# Patient Record
Sex: Male | Born: 1950
Health system: Southern US, Community
[De-identification: ages and names within clinical notes are randomized; demographics above are authoritative.]

## PROBLEM LIST (undated history)

## (undated) DIAGNOSIS — I2510 Atherosclerotic heart disease of native coronary artery without angina pectoris: Secondary | ICD-10-CM

## (undated) DIAGNOSIS — Z720 Tobacco use: Secondary | ICD-10-CM

## (undated) DIAGNOSIS — H269 Unspecified cataract: Secondary | ICD-10-CM

## (undated) HISTORY — PX: EYE SURGERY: SHX253

## (undated) HISTORY — DX: Unspecified cataract: H26.9

---

## 2018-11-23 ENCOUNTER — Emergency Department (HOSPITAL_COMMUNITY): Payer: Medicare Other

## 2018-11-23 ENCOUNTER — Encounter (HOSPITAL_COMMUNITY): Payer: Self-pay | Admitting: *Deleted

## 2018-11-23 ENCOUNTER — Other Ambulatory Visit: Payer: Self-pay

## 2018-11-23 ENCOUNTER — Inpatient Hospital Stay (HOSPITAL_COMMUNITY)
Admission: EM | Admit: 2018-11-23 | Discharge: 2018-12-11 | DRG: 215 | Disposition: A | Discharge status: Rehabilitation Facility | Payer: Medicare Other | Attending: Cardiothoracic Surgery | Admitting: Cardiothoracic Surgery

## 2018-11-23 DIAGNOSIS — J9690 Respiratory failure, unspecified, unspecified whether with hypoxia or hypercapnia: Secondary | ICD-10-CM

## 2018-11-23 DIAGNOSIS — Z09 Encounter for follow-up examination after completed treatment for conditions other than malignant neoplasm: Secondary | ICD-10-CM

## 2018-11-23 DIAGNOSIS — I959 Hypotension, unspecified: Secondary | ICD-10-CM | POA: Diagnosis not present

## 2018-11-23 DIAGNOSIS — T8111XA Postprocedural  cardiogenic shock, initial encounter: Secondary | ICD-10-CM | POA: Diagnosis not present

## 2018-11-23 DIAGNOSIS — E872 Acidosis: Secondary | ICD-10-CM

## 2018-11-23 DIAGNOSIS — I252 Old myocardial infarction: Secondary | ICD-10-CM

## 2018-11-23 DIAGNOSIS — Z8249 Family history of ischemic heart disease and other diseases of the circulatory system: Secondary | ICD-10-CM

## 2018-11-23 DIAGNOSIS — Z9889 Other specified postprocedural states: Secondary | ICD-10-CM

## 2018-11-23 DIAGNOSIS — I5041 Acute combined systolic (congestive) and diastolic (congestive) heart failure: Secondary | ICD-10-CM | POA: Diagnosis present

## 2018-11-23 DIAGNOSIS — R0603 Acute respiratory distress: Secondary | ICD-10-CM | POA: Diagnosis not present

## 2018-11-23 DIAGNOSIS — J9601 Acute respiratory failure with hypoxia: Secondary | ICD-10-CM | POA: Diagnosis present

## 2018-11-23 DIAGNOSIS — D689 Coagulation defect, unspecified: Secondary | ICD-10-CM | POA: Diagnosis not present

## 2018-11-23 DIAGNOSIS — J811 Chronic pulmonary edema: Secondary | ICD-10-CM

## 2018-11-23 DIAGNOSIS — R7303 Prediabetes: Secondary | ICD-10-CM

## 2018-11-23 DIAGNOSIS — R778 Other specified abnormalities of plasma proteins: Secondary | ICD-10-CM

## 2018-11-23 DIAGNOSIS — J90 Pleural effusion, not elsewhere classified: Secondary | ICD-10-CM

## 2018-11-23 DIAGNOSIS — N200 Calculus of kidney: Secondary | ICD-10-CM | POA: Diagnosis present

## 2018-11-23 DIAGNOSIS — R0902 Hypoxemia: Secondary | ICD-10-CM

## 2018-11-23 DIAGNOSIS — I161 Hypertensive emergency: Secondary | ICD-10-CM | POA: Diagnosis present

## 2018-11-23 DIAGNOSIS — D696 Thrombocytopenia, unspecified: Secondary | ICD-10-CM | POA: Diagnosis not present

## 2018-11-23 DIAGNOSIS — E874 Mixed disorder of acid-base balance: Secondary | ICD-10-CM | POA: Diagnosis present

## 2018-11-23 DIAGNOSIS — Z951 Presence of aortocoronary bypass graft: Secondary | ICD-10-CM

## 2018-11-23 DIAGNOSIS — I340 Nonrheumatic mitral (valve) insufficiency: Secondary | ICD-10-CM | POA: Diagnosis present

## 2018-11-23 DIAGNOSIS — J942 Hemothorax: Secondary | ICD-10-CM | POA: Diagnosis not present

## 2018-11-23 DIAGNOSIS — F17210 Nicotine dependence, cigarettes, uncomplicated: Secondary | ICD-10-CM | POA: Diagnosis present

## 2018-11-23 DIAGNOSIS — I480 Paroxysmal atrial fibrillation: Secondary | ICD-10-CM | POA: Diagnosis not present

## 2018-11-23 DIAGNOSIS — Z9689 Presence of other specified functional implants: Secondary | ICD-10-CM

## 2018-11-23 DIAGNOSIS — J9602 Acute respiratory failure with hypercapnia: Secondary | ICD-10-CM | POA: Diagnosis present

## 2018-11-23 DIAGNOSIS — E7800 Pure hypercholesterolemia, unspecified: Secondary | ICD-10-CM | POA: Diagnosis present

## 2018-11-23 DIAGNOSIS — Y92239 Unspecified place in hospital as the place of occurrence of the external cause: Secondary | ICD-10-CM | POA: Diagnosis present

## 2018-11-23 DIAGNOSIS — I5042 Chronic combined systolic (congestive) and diastolic (congestive) heart failure: Secondary | ICD-10-CM

## 2018-11-23 DIAGNOSIS — I110 Hypertensive heart disease with heart failure: Secondary | ICD-10-CM | POA: Diagnosis present

## 2018-11-23 DIAGNOSIS — Y838 Other surgical procedures as the cause of abnormal reaction of the patient, or of later complication, without mention of misadventure at the time of the procedure: Secondary | ICD-10-CM | POA: Diagnosis present

## 2018-11-23 DIAGNOSIS — Z20828 Contact with and (suspected) exposure to other viral communicable diseases: Secondary | ICD-10-CM | POA: Diagnosis present

## 2018-11-23 DIAGNOSIS — I2510 Atherosclerotic heart disease of native coronary artery without angina pectoris: Secondary | ICD-10-CM | POA: Diagnosis present

## 2018-11-23 DIAGNOSIS — I97611 Postprocedural hemorrhage and hematoma of a circulatory system organ or structure following cardiac bypass: Secondary | ICD-10-CM | POA: Diagnosis not present

## 2018-11-23 DIAGNOSIS — D72829 Elevated white blood cell count, unspecified: Secondary | ICD-10-CM

## 2018-11-23 DIAGNOSIS — J810 Acute pulmonary edema: Secondary | ICD-10-CM

## 2018-11-23 DIAGNOSIS — Z952 Presence of prosthetic heart valve: Secondary | ICD-10-CM

## 2018-11-23 DIAGNOSIS — R918 Other nonspecific abnormal finding of lung field: Secondary | ICD-10-CM

## 2018-11-23 DIAGNOSIS — I440 Atrioventricular block, first degree: Secondary | ICD-10-CM | POA: Diagnosis not present

## 2018-11-23 DIAGNOSIS — I214 Non-ST elevation (NSTEMI) myocardial infarction: Principal | ICD-10-CM | POA: Diagnosis present

## 2018-11-23 DIAGNOSIS — Z880 Allergy status to penicillin: Secondary | ICD-10-CM

## 2018-11-23 DIAGNOSIS — I10 Essential (primary) hypertension: Secondary | ICD-10-CM

## 2018-11-23 DIAGNOSIS — E1165 Type 2 diabetes mellitus with hyperglycemia: Secondary | ICD-10-CM | POA: Diagnosis present

## 2018-11-23 DIAGNOSIS — D62 Acute posthemorrhagic anemia: Secondary | ICD-10-CM | POA: Diagnosis not present

## 2018-11-23 DIAGNOSIS — I255 Ischemic cardiomyopathy: Secondary | ICD-10-CM | POA: Diagnosis present

## 2018-11-23 DIAGNOSIS — E8729 Other acidosis: Secondary | ICD-10-CM

## 2018-11-23 DIAGNOSIS — E785 Hyperlipidemia, unspecified: Secondary | ICD-10-CM | POA: Diagnosis present

## 2018-11-23 DIAGNOSIS — I447 Left bundle-branch block, unspecified: Secondary | ICD-10-CM | POA: Diagnosis present

## 2018-11-23 HISTORY — DX: Tobacco use: Z72.0

## 2018-11-23 HISTORY — DX: Atherosclerotic heart disease of native coronary artery without angina pectoris: I25.10

## 2018-11-23 LAB — POCT I-STAT 7, (LYTES, BLD GAS, ICA,H+H)
Acid-base deficit: 4 mmol/L — ABNORMAL HIGH (ref 0.0–2.0)
Bicarbonate: 25.6 mmol/L (ref 20.0–28.0)
Calcium, Ion: 1.17 mmol/L (ref 1.15–1.40)
HCT: 48 % (ref 39.0–52.0)
Hemoglobin: 16.3 g/dL (ref 13.0–17.0)
O2 Saturation: 100 %
Patient temperature: 98
Potassium: 3.3 mmol/L — ABNORMAL LOW (ref 3.5–5.1)
Sodium: 137 mmol/L (ref 135–145)
TCO2: 28 mmol/L (ref 22–32)
pCO2 arterial: 63.3 mmHg — ABNORMAL HIGH (ref 32.0–48.0)
pH, Arterial: 7.213 — ABNORMAL LOW (ref 7.350–7.450)
pO2, Arterial: 408 mmHg — ABNORMAL HIGH (ref 83.0–108.0)

## 2018-11-23 LAB — I-STAT CHEM 8, ED
BUN: 22 mg/dL (ref 8–23)
Calcium, Ion: 1.08 mmol/L — ABNORMAL LOW (ref 1.15–1.40)
Chloride: 102 mmol/L (ref 98–111)
Creatinine, Ser: 1.3 mg/dL — ABNORMAL HIGH (ref 0.61–1.24)
Glucose, Bld: 271 mg/dL — ABNORMAL HIGH (ref 70–99)
HCT: 50 % (ref 39.0–52.0)
Hemoglobin: 17 g/dL (ref 13.0–17.0)
Potassium: 3.7 mmol/L (ref 3.5–5.1)
Sodium: 139 mmol/L (ref 135–145)
TCO2: 25 mmol/L (ref 22–32)

## 2018-11-23 LAB — CBG MONITORING, ED: Glucose-Capillary: 272 mg/dL — ABNORMAL HIGH (ref 70–99)

## 2018-11-23 MED ORDER — PROPOFOL 10 MG/ML IV BOLUS
INTRAVENOUS | Status: AC
  Filled 2018-11-23: qty 20

## 2018-11-23 MED ORDER — ADENOSINE 6 MG/2ML IV SOLN
INTRAVENOUS | Status: AC
  Filled 2018-11-23: qty 6

## 2018-11-23 MED ORDER — NITROGLYCERIN 2 % TD OINT
1.0000 [in_us] | TOPICAL_OINTMENT | Freq: Once | TRANSDERMAL | Status: AC
  Administered 2018-11-23: 1 [in_us] via TOPICAL

## 2018-11-23 MED ORDER — FUROSEMIDE 10 MG/ML IJ SOLN
40.0000 mg | Freq: Once | INTRAMUSCULAR | Status: AC
  Administered 2018-11-23: 40 mg via INTRAVENOUS

## 2018-11-23 MED ORDER — ADENOSINE 6 MG/2ML IV SOLN
6.0000 mg | Freq: Once | INTRAVENOUS | Status: AC
  Administered 2018-11-23: 6 mg via INTRAVENOUS

## 2018-11-23 MED ORDER — PROPOFOL 1000 MG/100ML IV EMUL
INTRAVENOUS | Status: AC
  Filled 2018-11-23: qty 100

## 2018-11-23 MED ORDER — FUROSEMIDE 10 MG/ML IJ SOLN
INTRAMUSCULAR | Status: AC
  Filled 2018-11-23: qty 4

## 2018-11-23 NOTE — ED Triage Notes (Signed)
Pt woke up at 2200 for sudden onset of SOB, called his neighbor who is a Dietitian who called ems. On arrival to home pt pale and diaphoretic, EMS reported pt had multiple syncopal episodes with decreased respiratory effort. 0.3mg  epi, 2g Mag, and 125mg  solumedrol given enroute. BVM in place on arrival, pt alert. Initial sats 60% on room air, improved to 84% with BVM. On arrival to ED, pt alert, able to answer questions in short sentences. Placed on bipap. Pt denies medical hx

## 2018-11-23 NOTE — ED Provider Notes (Signed)
TIME SEEN: 11:54 PM  CHIEF COMPLAINT: SOB  HPI: Patient is a 68 year old male with history of current tobacco use who presents to the emergency department with EMS for respiratory distress.  Patient states about 2 hours prior to arrival he developed sudden onset shortness of breath that progressively worsened.  Called his neighbor who is a Education officer, museum who called EMS.  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  EMS found patient to have sats of 60% on room air.  He does not wear oxygen chronically.  Appeared in significant distress, diaphoretic, tachycardic, tachypneic.  EMS gave 0.3 mg of IM epinephrine, 2 g of IV magnesium and 125 mg of IV Solu-Medrol.  They report he had multiple syncopal events in route but never lost pulses and stayed in a sinus tachycardia.  Patient currently alert and able to answer questions with 1-2 words.  Intermittently getting bagged with EMS.  Patient denies recent fevers, cough, lower extremity swelling or pain, chest pain or chest discomfort.  He denies history of asthma, COPD, CHF, PE, DVT.  Patient states he does not wear oxygen chronically.  Neighbor - Randall Hiss Anterhaus [phone]  ROS: Level 5 caveat for respiratory distress  PAST MEDICAL HISTORY/PAST SURGICAL HISTORY:  History reviewed. No pertinent past medical history.  MEDICATIONS:  Prior to Admission medications   Not on File    ALLERGIES:  No Known Allergies  SOCIAL HISTORY:  Social History   Tobacco Use  . Smoking status: Current Some Day Smoker  Substance Use Topics  . Alcohol use: Not on file    FAMILY HISTORY: No family history on file.  EXAM: BP (!) 184/105   Pulse (!) 130   Temp 98 F (36.7 C) (Axillary)   Resp (!) 34   SpO2 100%  CONSTITUTIONAL: Alert and oriented and responds appropriately to questions but with 1-2 word answers.  In moderate to severe respiratory distress.  Will open eyes when asked.  Moves his extremities spontaneously.  GCS 14. HEAD:  Normocephalic EYES: Conjunctivae clear, pupils appear equal, EOMI ENT: normal nose; moist mucous membranes NECK: Supple, no meningismus, no nuchal rigidity, no LAD  CARD: Regular and tachycardic; S1 and S2 appreciated; no murmurs, no clicks, no rubs, no gallops RESP: Patient is tachypneic and in moderate to severe respiratory distress, speaking 1-2 words at a time, significant increased work of breathing, diffuse rales on exam, no wheezing or rhonchi ABD/GI: Normal bowel sounds; non-distended; soft, non-tender, no rebound, no guarding, no peritoneal signs, no hepatosplenomegaly BACK:  The back appears normal and is non-tender to palpation, there is no CVA tenderness EXT: Normal ROM in all joints; non-tender to palpation; no edema; normal capillary refill; no cyanosis, no calf tenderness or swelling    SKIN: Normal color for age and race; warm; no rash NEURO: Moves all extremities equally, opens his eyes on command, able to talk when asked questions  MEDICAL DECISION MAKING: Patient here with severe respiratory distress.  He appears volume overloaded on exam and by chest x-ray.  Likely flash pulmonary edema from hypertension.  Will give 40 of IV Lasix, start BiPAP, place an inch of Nitropaste on his chest.  He does have a very regular narrow complex tachycardia that could be SVT versus A. fib/a flutter.  Patient was given 6 mg of adenosine without any change.  Rate slowly improving while on BiPAP and now appears to be sinus tachycardia.  He denies any chest pain or chest discomfort.  EKG shows rate related  changes but I do not think this is an MI.  ABG shows respiratory acidosis.  No recent infectious symptoms to suggest COVID.  ED PROGRESS: Labs show mild elevation of troponin which is likely secondary to demand ischemia.  BNP was 507.  Repeat ABG after 1 hour on BiPAP has significantly improved.  His respiratory effort has improved and he is able to speak full sentences.  He is very concerned about  who is going to care for his mother while he is in the hospital.  Nursing staff was able to talk to his neighbor.  COVID swab today negative.  Will discuss with medicine for admission to a stepdown bed.  Blood pressure and heart rate have significantly improved as well.  Rectal temperature is 98.8.  He does have a leukocytosis of 21,000 but this likely reactive in nature rather than due to infection.  Will add on urinalysis.  2:16 AM Discussed patient's case with hospitalist, Dr. Loney Lohathore.  I have recommended admission and patient (and family if present) agree with this plan. Admitting physician will place admission orders.   I reviewed all nursing notes, vitals, pertinent previous records, EKGs, lab and urine results, imaging (as available).     CRITICAL CARE Performed by: Rochele RaringKristen Ward   Total critical care time: 65 minutes  Critical care time was exclusive of separately billable procedures and treating other patients.  Critical care was necessary to treat or prevent imminent or life-threatening deterioration.  Critical care was time spent personally by me on the following activities: development of treatment plan with patient and/or surrogate as well as nursing, discussions with consultants, evaluation of patient's response to treatment, examination of patient, obtaining history from patient or surrogate, ordering and performing treatments and interventions, ordering and review of laboratory studies, ordering and review of radiographic studies, pulse oximetry and re-evaluation of patient's condition.     EKG Interpretation  Date/Time:  Monday November 23 2018 23:29:21 EDT Ventricular Rate:  140 PR Interval:    QRS Duration: 148 QT Interval:  368 QTC Calculation: 562 R Axis:   90 Text Interpretation:  Right and left arm electrode reversal, interpretation assumes no reversal Sinus or ectopic atrial tachycardia Consider left atrial enlargement Probable anterolateral infarct, acute  likely rate related Abnormal T, consider ischemia, inferior leads likely rate related Prolonged QT interval No significant change since last tracing Confirmed by Rochele RaringWard, Kristen [phone]([id number]) on 11/24/2018 1:12:01 AM         EKG Interpretation  Date/Time:  Tuesday November 24 2018 00:38:02 EDT Ventricular Rate:  102 PR Interval:    QRS Duration: 153 QT Interval:  389 QTC Calculation: 507 R Axis:   52 Text Interpretation:  Sinus tachycardia Left bundle branch block Rate improved from previous Confirmed by Rochele RaringWard, Kristen [phone]([id number]) on 11/24/2018 1:12:44 AM       Christ KickPaul Barringer was evaluated in Emergency Department on 11/24/2018 for the symptoms described in the history of present illness. He was evaluated in the context of the global COVID-19 pandemic, which necessitated consideration that the patient might be at risk for infection with the SARS-CoV-2 virus that causes COVID-19. Institutional protocols and algorithms that pertain to the evaluation of patients at risk for COVID-19 are in a state of rapid change based on information released by regulatory bodies including the CDC and federal and state organizations. These policies and algorithms were followed during the patient's care in the ED.    Ward, Layla MawKristen N, DO 11/24/18 [phone]

## 2018-11-24 ENCOUNTER — Encounter (HOSPITAL_COMMUNITY)
Admission: EM | Disposition: A | Discharge status: Rehabilitation Facility | Payer: Self-pay | Source: Home / Self Care | Attending: Cardiothoracic Surgery

## 2018-11-24 ENCOUNTER — Encounter (HOSPITAL_COMMUNITY): Payer: Self-pay | Admitting: Internal Medicine

## 2018-11-24 ENCOUNTER — Inpatient Hospital Stay (HOSPITAL_COMMUNITY): Payer: Medicare Other

## 2018-11-24 DIAGNOSIS — Z952 Presence of prosthetic heart valve: Secondary | ICD-10-CM | POA: Diagnosis not present

## 2018-11-24 DIAGNOSIS — I161 Hypertensive emergency: Secondary | ICD-10-CM | POA: Diagnosis present

## 2018-11-24 DIAGNOSIS — D72829 Elevated white blood cell count, unspecified: Secondary | ICD-10-CM | POA: Diagnosis not present

## 2018-11-24 DIAGNOSIS — Y92239 Unspecified place in hospital as the place of occurrence of the external cause: Secondary | ICD-10-CM | POA: Diagnosis present

## 2018-11-24 DIAGNOSIS — J9601 Acute respiratory failure with hypoxia: Secondary | ICD-10-CM

## 2018-11-24 DIAGNOSIS — R5381 Other malaise: Secondary | ICD-10-CM | POA: Diagnosis not present

## 2018-11-24 DIAGNOSIS — I214 Non-ST elevation (NSTEMI) myocardial infarction: Principal | ICD-10-CM

## 2018-11-24 DIAGNOSIS — R0603 Acute respiratory distress: Secondary | ICD-10-CM | POA: Diagnosis present

## 2018-11-24 DIAGNOSIS — J9602 Acute respiratory failure with hypercapnia: Secondary | ICD-10-CM

## 2018-11-24 DIAGNOSIS — I2699 Other pulmonary embolism without acute cor pulmonale: Secondary | ICD-10-CM | POA: Diagnosis not present

## 2018-11-24 DIAGNOSIS — I97631 Postprocedural hematoma of a circulatory system organ or structure following cardiac bypass: Secondary | ICD-10-CM | POA: Diagnosis not present

## 2018-11-24 DIAGNOSIS — J810 Acute pulmonary edema: Secondary | ICD-10-CM | POA: Diagnosis not present

## 2018-11-24 DIAGNOSIS — J811 Chronic pulmonary edema: Secondary | ICD-10-CM

## 2018-11-24 DIAGNOSIS — I361 Nonrheumatic tricuspid (valve) insufficiency: Secondary | ICD-10-CM | POA: Diagnosis not present

## 2018-11-24 DIAGNOSIS — D62 Acute posthemorrhagic anemia: Secondary | ICD-10-CM | POA: Diagnosis not present

## 2018-11-24 DIAGNOSIS — Z01810 Encounter for preprocedural cardiovascular examination: Secondary | ICD-10-CM | POA: Diagnosis not present

## 2018-11-24 DIAGNOSIS — E785 Hyperlipidemia, unspecified: Secondary | ICD-10-CM | POA: Diagnosis present

## 2018-11-24 DIAGNOSIS — R570 Cardiogenic shock: Secondary | ICD-10-CM | POA: Diagnosis not present

## 2018-11-24 DIAGNOSIS — Z951 Presence of aortocoronary bypass graft: Secondary | ICD-10-CM | POA: Diagnosis not present

## 2018-11-24 DIAGNOSIS — Y838 Other surgical procedures as the cause of abnormal reaction of the patient, or of later complication, without mention of misadventure at the time of the procedure: Secondary | ICD-10-CM | POA: Diagnosis present

## 2018-11-24 DIAGNOSIS — N200 Calculus of kidney: Secondary | ICD-10-CM | POA: Diagnosis present

## 2018-11-24 DIAGNOSIS — I5042 Chronic combined systolic (congestive) and diastolic (congestive) heart failure: Secondary | ICD-10-CM | POA: Diagnosis not present

## 2018-11-24 DIAGNOSIS — I447 Left bundle-branch block, unspecified: Secondary | ICD-10-CM | POA: Diagnosis present

## 2018-11-24 DIAGNOSIS — T8111XA Postprocedural  cardiogenic shock, initial encounter: Secondary | ICD-10-CM | POA: Diagnosis not present

## 2018-11-24 DIAGNOSIS — I5043 Acute on chronic combined systolic (congestive) and diastolic (congestive) heart failure: Secondary | ICD-10-CM | POA: Diagnosis not present

## 2018-11-24 DIAGNOSIS — D72823 Leukemoid reaction: Secondary | ICD-10-CM | POA: Diagnosis not present

## 2018-11-24 DIAGNOSIS — E7800 Pure hypercholesterolemia, unspecified: Secondary | ICD-10-CM | POA: Diagnosis present

## 2018-11-24 DIAGNOSIS — I255 Ischemic cardiomyopathy: Secondary | ICD-10-CM | POA: Diagnosis present

## 2018-11-24 DIAGNOSIS — E871 Hypo-osmolality and hyponatremia: Secondary | ICD-10-CM | POA: Diagnosis not present

## 2018-11-24 DIAGNOSIS — R7303 Prediabetes: Secondary | ICD-10-CM | POA: Diagnosis not present

## 2018-11-24 DIAGNOSIS — I340 Nonrheumatic mitral (valve) insufficiency: Secondary | ICD-10-CM

## 2018-11-24 DIAGNOSIS — I2510 Atherosclerotic heart disease of native coronary artery without angina pectoris: Secondary | ICD-10-CM | POA: Diagnosis present

## 2018-11-24 DIAGNOSIS — R52 Pain, unspecified: Secondary | ICD-10-CM | POA: Diagnosis not present

## 2018-11-24 DIAGNOSIS — E874 Mixed disorder of acid-base balance: Secondary | ICD-10-CM | POA: Diagnosis present

## 2018-11-24 DIAGNOSIS — I5021 Acute systolic (congestive) heart failure: Secondary | ICD-10-CM | POA: Diagnosis not present

## 2018-11-24 DIAGNOSIS — I97611 Postprocedural hemorrhage and hematoma of a circulatory system organ or structure following cardiac bypass: Secondary | ICD-10-CM | POA: Diagnosis not present

## 2018-11-24 DIAGNOSIS — F17210 Nicotine dependence, cigarettes, uncomplicated: Secondary | ICD-10-CM | POA: Diagnosis present

## 2018-11-24 DIAGNOSIS — I5041 Acute combined systolic (congestive) and diastolic (congestive) heart failure: Secondary | ICD-10-CM | POA: Diagnosis present

## 2018-11-24 DIAGNOSIS — I480 Paroxysmal atrial fibrillation: Secondary | ICD-10-CM | POA: Diagnosis not present

## 2018-11-24 DIAGNOSIS — Z20828 Contact with and (suspected) exposure to other viral communicable diseases: Secondary | ICD-10-CM | POA: Diagnosis present

## 2018-11-24 DIAGNOSIS — R0902 Hypoxemia: Secondary | ICD-10-CM | POA: Diagnosis not present

## 2018-11-24 DIAGNOSIS — I25110 Atherosclerotic heart disease of native coronary artery with unstable angina pectoris: Secondary | ICD-10-CM | POA: Diagnosis not present

## 2018-11-24 DIAGNOSIS — D689 Coagulation defect, unspecified: Secondary | ICD-10-CM | POA: Diagnosis not present

## 2018-11-24 DIAGNOSIS — R7309 Other abnormal glucose: Secondary | ICD-10-CM | POA: Diagnosis not present

## 2018-11-24 DIAGNOSIS — I10 Essential (primary) hypertension: Secondary | ICD-10-CM | POA: Diagnosis not present

## 2018-11-24 DIAGNOSIS — J942 Hemothorax: Secondary | ICD-10-CM | POA: Diagnosis not present

## 2018-11-24 DIAGNOSIS — D696 Thrombocytopenia, unspecified: Secondary | ICD-10-CM | POA: Diagnosis not present

## 2018-11-24 DIAGNOSIS — R7989 Other specified abnormal findings of blood chemistry: Secondary | ICD-10-CM | POA: Diagnosis not present

## 2018-11-24 DIAGNOSIS — I110 Hypertensive heart disease with heart failure: Secondary | ICD-10-CM | POA: Diagnosis present

## 2018-11-24 DIAGNOSIS — E1165 Type 2 diabetes mellitus with hyperglycemia: Secondary | ICD-10-CM | POA: Diagnosis present

## 2018-11-24 DIAGNOSIS — R778 Other specified abnormalities of plasma proteins: Secondary | ICD-10-CM

## 2018-11-24 HISTORY — PX: LEFT HEART CATH AND CORONARY ANGIOGRAPHY: CATH118249

## 2018-11-24 LAB — MAGNESIUM: Magnesium: 2 mg/dL (ref 1.7–2.4)

## 2018-11-24 LAB — COMPREHENSIVE METABOLIC PANEL
ALT: 30 U/L (ref 0–44)
AST: 45 U/L — ABNORMAL HIGH (ref 15–41)
Albumin: 3.8 g/dL (ref 3.5–5.0)
Alkaline Phosphatase: 89 U/L (ref 38–126)
Anion gap: 14 (ref 5–15)
BUN: 16 mg/dL (ref 8–23)
CO2: 23 mmol/L (ref 22–32)
Calcium: 8.5 mg/dL — ABNORMAL LOW (ref 8.9–10.3)
Chloride: 102 mmol/L (ref 98–111)
Creatinine, Ser: 1.42 mg/dL — ABNORMAL HIGH (ref 0.61–1.24)
GFR calc Af Amer: 60 mL/min — ABNORMAL LOW (ref >60)
GFR calc non Af Amer: 51 mL/min — ABNORMAL LOW (ref >60)
Glucose, Bld: 278 mg/dL — ABNORMAL HIGH (ref 70–99)
Potassium: 3.8 mmol/L (ref 3.5–5.1)
Sodium: 139 mmol/L (ref 135–145)
Total Bilirubin: 2.2 mg/dL — ABNORMAL HIGH (ref 0.3–1.2)
Total Protein: 7 g/dL (ref 6.5–8.1)

## 2018-11-24 LAB — URINALYSIS, ROUTINE W REFLEX MICROSCOPIC
Bilirubin Urine: NEGATIVE
Glucose, UA: NEGATIVE mg/dL
Ketones, ur: NEGATIVE mg/dL
Leukocytes,Ua: NEGATIVE
Nitrite: NEGATIVE
Protein, ur: NEGATIVE mg/dL
Specific Gravity, Urine: 1.008 (ref 1.005–1.030)
pH: 5 (ref 5.0–8.0)

## 2018-11-24 LAB — BASIC METABOLIC PANEL
Anion gap: 14 (ref 5–15)
BUN: 15 mg/dL (ref 8–23)
CO2: 23 mmol/L (ref 22–32)
Calcium: 8.8 mg/dL — ABNORMAL LOW (ref 8.9–10.3)
Chloride: 102 mmol/L (ref 98–111)
Creatinine, Ser: 1.3 mg/dL — ABNORMAL HIGH (ref 0.61–1.24)
GFR calc Af Amer: >60 mL/min (ref >60)
GFR calc non Af Amer: 57 mL/min — ABNORMAL LOW (ref >60)
Glucose, Bld: 192 mg/dL — ABNORMAL HIGH (ref 70–99)
Potassium: 3.6 mmol/L (ref 3.5–5.1)
Sodium: 139 mmol/L (ref 135–145)

## 2018-11-24 LAB — CBC WITH DIFFERENTIAL/PLATELET
Abs Immature Granulocytes: 0.2 10*3/uL — ABNORMAL HIGH (ref 0.00–0.07)
Basophils Absolute: 0.2 10*3/uL — ABNORMAL HIGH (ref 0.0–0.1)
Basophils Relative: 1 %
Eosinophils Absolute: 0.8 10*3/uL — ABNORMAL HIGH (ref 0.0–0.5)
Eosinophils Relative: 4 %
HCT: 49 % (ref 39.0–52.0)
Hemoglobin: 16.6 g/dL (ref 13.0–17.0)
Lymphocytes Relative: 31 %
Lymphs Abs: 6.5 10*3/uL — ABNORMAL HIGH (ref 0.7–4.0)
MCH: 34.6 pg — ABNORMAL HIGH (ref 26.0–34.0)
MCHC: 33.9 g/dL (ref 30.0–36.0)
MCV: 102.1 fL — ABNORMAL HIGH (ref 80.0–100.0)
Monocytes Absolute: 0.4 10*3/uL (ref 0.1–1.0)
Monocytes Relative: 2 %
Myelocytes: 1 %
Neutro Abs: 12.9 10*3/uL — ABNORMAL HIGH (ref 1.7–7.7)
Neutrophils Relative %: 61 %
Platelets: 395 10*3/uL (ref 150–400)
RBC: 4.8 MIL/uL (ref 4.22–5.81)
RDW: 12 % (ref 11.5–15.5)
WBC: 21.1 10*3/uL — ABNORMAL HIGH (ref 4.0–10.5)
nRBC: 0 % (ref 0.0–0.2)
nRBC: 0 /100 WBC

## 2018-11-24 LAB — POCT I-STAT 7, (LYTES, BLD GAS, ICA,H+H)
Acid-base deficit: 1 mmol/L (ref 0.0–2.0)
Bicarbonate: 24.6 mmol/L (ref 20.0–28.0)
Calcium, Ion: 1.15 mmol/L (ref 1.15–1.40)
HCT: 45 % (ref 39.0–52.0)
Hemoglobin: 15.3 g/dL (ref 13.0–17.0)
O2 Saturation: 100 %
Patient temperature: 98
Potassium: 3.3 mmol/L — ABNORMAL LOW (ref 3.5–5.1)
Sodium: 138 mmol/L (ref 135–145)
TCO2: 26 mmol/L (ref 22–32)
pCO2 arterial: 44 mmHg (ref 32.0–48.0)
pH, Arterial: 7.353 (ref 7.350–7.450)
pO2, Arterial: 329 mmHg — ABNORMAL HIGH (ref 83.0–108.0)

## 2018-11-24 LAB — CBC
HCT: 48.7 % (ref 39.0–52.0)
Hemoglobin: 16.2 g/dL (ref 13.0–17.0)
MCH: 32.9 pg (ref 26.0–34.0)
MCHC: 33.3 g/dL (ref 30.0–36.0)
MCV: 99 fL (ref 80.0–100.0)
Platelets: 285 10*3/uL (ref 150–400)
RBC: 4.92 MIL/uL (ref 4.22–5.81)
RDW: 12.1 % (ref 11.5–15.5)
WBC: 18.3 10*3/uL — ABNORMAL HIGH (ref 4.0–10.5)
nRBC: 0 % (ref 0.0–0.2)

## 2018-11-24 LAB — HEPATIC FUNCTION PANEL
ALT: 38 U/L (ref 0–44)
AST: 59 U/L — ABNORMAL HIGH (ref 15–41)
Albumin: 3.9 g/dL (ref 3.5–5.0)
Alkaline Phosphatase: 80 U/L (ref 38–126)
Bilirubin, Direct: 0.2 mg/dL (ref 0.0–0.2)
Indirect Bilirubin: 1.2 mg/dL — ABNORMAL HIGH (ref 0.3–0.9)
Total Bilirubin: 1.4 mg/dL — ABNORMAL HIGH (ref 0.3–1.2)
Total Protein: 7 g/dL (ref 6.5–8.1)

## 2018-11-24 LAB — PROCALCITONIN: Procalcitonin: <0.1 ng/mL

## 2018-11-24 LAB — BRAIN NATRIURETIC PEPTIDE: B Natriuretic Peptide: 507.9 pg/mL — ABNORMAL HIGH (ref 0.0–100.0)

## 2018-11-24 LAB — T4, FREE: Free T4: 1.38 ng/dL — ABNORMAL HIGH (ref 0.61–1.12)

## 2018-11-24 LAB — TROPONIN I (HIGH SENSITIVITY)
Troponin I (High Sensitivity): 63 ng/L — ABNORMAL HIGH (ref <18)
Troponin I (High Sensitivity): 4477 ng/L (ref <18)
Troponin I (High Sensitivity): 9016 ng/L (ref <18)

## 2018-11-24 LAB — ECHOCARDIOGRAM COMPLETE
Height: 69 in
Weight: 3319.25 oz

## 2018-11-24 LAB — HEMOGLOBIN A1C
Hgb A1c MFr Bld: 5.8 % — ABNORMAL HIGH (ref 4.8–5.6)
Mean Plasma Glucose: 119.76 mg/dL

## 2018-11-24 LAB — HEPARIN LEVEL (UNFRACTIONATED): Heparin Unfractionated: 0.16 IU/mL — ABNORMAL LOW (ref 0.30–0.70)

## 2018-11-24 LAB — SARS CORONAVIRUS 2 BY RT PCR (HOSPITAL ORDER, PERFORMED IN ~~LOC~~ HOSPITAL LAB): SARS Coronavirus 2: NEGATIVE

## 2018-11-24 LAB — TSH: TSH: 0.35 u[IU]/mL (ref 0.350–4.500)

## 2018-11-24 LAB — HIV ANTIBODY (ROUTINE TESTING W REFLEX): HIV Screen 4th Generation wRfx: NONREACTIVE

## 2018-11-24 SURGERY — LEFT HEART CATH AND CORONARY ANGIOGRAPHY
Anesthesia: LOCAL

## 2018-11-24 MED ORDER — SODIUM CHLORIDE 0.9 % IV SOLN: INTRAVENOUS | Status: AC

## 2018-11-24 MED ORDER — ASPIRIN 81 MG PO CHEW
324.0000 mg | CHEWABLE_TABLET | Freq: Once | ORAL | Status: AC
  Administered 2018-11-24: 324 mg via ORAL
  Filled 2018-11-24: qty 4

## 2018-11-24 MED ORDER — HEPARIN BOLUS VIA INFUSION
2000.0000 [IU] | Freq: Once | INTRAVENOUS | Status: AC
  Administered 2018-11-24: 2000 [IU] via INTRAVENOUS
  Filled 2018-11-24: qty 2000

## 2018-11-24 MED ORDER — FENTANYL CITRATE (PF) 100 MCG/2ML IJ SOLN
INTRAMUSCULAR | Status: AC
  Filled 2018-11-24: qty 2

## 2018-11-24 MED ORDER — LIDOCAINE HCL (PF) 1 % IJ SOLN
INTRAMUSCULAR | Status: DC | PRN
  Administered 2018-11-24: 2 mL

## 2018-11-24 MED ORDER — VERAPAMIL HCL 2.5 MG/ML IV SOLN
INTRAVENOUS | Status: DC | PRN
  Administered 2018-11-24: 16:00:00 10 mL via INTRA_ARTERIAL

## 2018-11-24 MED ORDER — HEPARIN SODIUM (PORCINE) 5000 UNIT/ML IJ SOLN
5000.0000 [IU] | Freq: Three times a day (TID) | INTRAMUSCULAR | Status: DC
  Administered 2018-11-24: 5000 [IU] via SUBCUTANEOUS
  Filled 2018-11-24: qty 1

## 2018-11-24 MED ORDER — MIDAZOLAM HCL 2 MG/2ML IJ SOLN
INTRAMUSCULAR | Status: DC | PRN
  Administered 2018-11-24: 1 mg via INTRAVENOUS

## 2018-11-24 MED ORDER — HYDRALAZINE HCL 20 MG/ML IJ SOLN: 10.0000 mg | INTRAMUSCULAR | Status: AC | PRN

## 2018-11-24 MED ORDER — ATORVASTATIN CALCIUM 80 MG PO TABS
80.0000 mg | ORAL_TABLET | Freq: Every day | ORAL | Status: DC
  Administered 2018-11-25 – 2018-11-29 (×5): 80 mg via ORAL
  Filled 2018-11-24 (×5): qty 1

## 2018-11-24 MED ORDER — SODIUM CHLORIDE 0.9 % IV SOLN: INTRAVENOUS | Status: DC

## 2018-11-24 MED ORDER — HEPARIN SODIUM (PORCINE) 1000 UNIT/ML IJ SOLN
INTRAMUSCULAR | Status: DC | PRN
  Administered 2018-11-24: 4500 [IU] via INTRAVENOUS

## 2018-11-24 MED ORDER — ACETAMINOPHEN 325 MG PO TABS: 650.0000 mg | ORAL_TABLET | ORAL | Status: DC | PRN

## 2018-11-24 MED ORDER — SODIUM CHLORIDE 0.9 % IV SOLN
INTRAVENOUS | Status: AC | PRN
  Administered 2018-11-24: 10 mL/h via INTRAVENOUS

## 2018-11-24 MED ORDER — HEPARIN (PORCINE) IN NACL 1000-0.9 UT/500ML-% IV SOLN
INTRAVENOUS | Status: AC
  Filled 2018-11-24: qty 1000

## 2018-11-24 MED ORDER — HEPARIN (PORCINE) 25000 UT/250ML-% IV SOLN
1450.0000 [IU]/h | INTRAVENOUS | Status: DC
  Administered 2018-11-24: 07:00:00 1200 [IU]/h via INTRAVENOUS
  Filled 2018-11-24: qty 250

## 2018-11-24 MED ORDER — ACETAMINOPHEN 325 MG PO TABS: 650.0000 mg | ORAL_TABLET | Freq: Four times a day (QID) | ORAL | Status: DC | PRN

## 2018-11-24 MED ORDER — MIDAZOLAM HCL 2 MG/2ML IJ SOLN
INTRAMUSCULAR | Status: AC
  Filled 2018-11-24: qty 2

## 2018-11-24 MED ORDER — FENTANYL CITRATE (PF) 100 MCG/2ML IJ SOLN
INTRAMUSCULAR | Status: DC | PRN
  Administered 2018-11-24: 25 ug via INTRAVENOUS

## 2018-11-24 MED ORDER — VERAPAMIL HCL 2.5 MG/ML IV SOLN
INTRAVENOUS | Status: AC
  Filled 2018-11-24: qty 2

## 2018-11-24 MED ORDER — IOHEXOL 350 MG/ML SOLN
INTRAVENOUS | Status: DC | PRN
  Administered 2018-11-24: 16:00:00 95 mL via INTRACARDIAC

## 2018-11-24 MED ORDER — ONDANSETRON HCL 4 MG/2ML IJ SOLN: 4.0000 mg | Freq: Four times a day (QID) | INTRAMUSCULAR | Status: DC | PRN

## 2018-11-24 MED ORDER — HEPARIN SODIUM (PORCINE) 1000 UNIT/ML IJ SOLN
INTRAMUSCULAR | Status: AC
  Filled 2018-11-24: qty 1

## 2018-11-24 MED ORDER — FUROSEMIDE 10 MG/ML IJ SOLN
40.0000 mg | Freq: Two times a day (BID) | INTRAMUSCULAR | Status: DC
  Administered 2018-11-24 – 2018-11-28 (×8): 40 mg via INTRAVENOUS
  Filled 2018-11-24 (×8): qty 4

## 2018-11-24 MED ORDER — HEPARIN (PORCINE) 25000 UT/250ML-% IV SOLN
1350.0000 [IU]/h | INTRAVENOUS | Status: DC
  Administered 2018-11-25 – 2018-11-28 (×6): 1450 [IU]/h via INTRAVENOUS
  Filled 2018-11-24 (×8): qty 250

## 2018-11-24 MED ORDER — HEPARIN (PORCINE) IN NACL 1000-0.9 UT/500ML-% IV SOLN
INTRAVENOUS | Status: DC | PRN
  Administered 2018-11-24 (×2): 500 mL

## 2018-11-24 MED ORDER — LIDOCAINE HCL (PF) 1 % IJ SOLN
INTRAMUSCULAR | Status: AC
  Filled 2018-11-24: qty 30

## 2018-11-24 MED ORDER — ACETAMINOPHEN 650 MG RE SUPP: 650.0000 mg | Freq: Four times a day (QID) | RECTAL | Status: DC | PRN

## 2018-11-24 MED ORDER — NITROGLYCERIN IN D5W 200-5 MCG/ML-% IV SOLN: 20.0000 ug/min | INTRAVENOUS | Status: DC

## 2018-11-24 MED ORDER — NITROGLYCERIN 1 MG/10 ML FOR IR/CATH LAB
INTRA_ARTERIAL | Status: AC
  Filled 2018-11-24: qty 10

## 2018-11-24 MED ORDER — LABETALOL HCL 5 MG/ML IV SOLN: 10.0000 mg | INTRAVENOUS | Status: AC | PRN

## 2018-11-24 MED ORDER — HYDRALAZINE HCL 20 MG/ML IJ SOLN: 5.0000 mg | INTRAMUSCULAR | Status: DC | PRN

## 2018-11-24 SURGICAL SUPPLY — 12 items
CATH INFINITI 5 FR JL3.5 (CATHETERS) ×2 IMPLANT
CATH INFINITI 5FR MPB2 (CATHETERS) ×2 IMPLANT
CATH INFINITI JR4 5F (CATHETERS) ×2 IMPLANT
DEVICE RAD COMP TR BAND LRG (VASCULAR PRODUCTS) ×2 IMPLANT
GLIDESHEATH SLEND A-KIT 6F 22G (SHEATH) ×2 IMPLANT
GUIDEWIRE INQWIRE 1.5J.035X260 (WIRE) ×1 IMPLANT
INQWIRE 1.5J .035X260CM (WIRE) ×2
KIT HEART LEFT (KITS) ×2 IMPLANT
PACK CARDIAC CATHETERIZATION (CUSTOM PROCEDURE TRAY) ×2 IMPLANT
SHEATH PROBE COVER 6X72 (BAG) ×2 IMPLANT
TRANSDUCER W/STOPCOCK (MISCELLANEOUS) ×2 IMPLANT
TUBING CIL FLEX 10 FLL-RA (TUBING) ×2 IMPLANT

## 2018-11-24 NOTE — Progress Notes (Signed)
3 more cc's removed- total of 8 cc's remaining.  Site CDI. Patient complains of no pain. Sitting up and eating a snack pack.

## 2018-11-24 NOTE — Progress Notes (Signed)
Echocardiogram 2D Echocardiogram has been performed.  Matilde Bash 11/24/2018, 11:44 AM

## 2018-11-24 NOTE — Progress Notes (Signed)
Inpatient Diabetes Program Recommendations  AACE/ADA: New Consensus Statement on Inpatient Glycemic Control (2015)  Target Ranges:  Prepandial:   less than 140 mg/dL      Peak postprandial:   less than 180 mg/dL (1-2 hours)      Critically ill patients:  140 - 180 mg/dL   Lab Results  Component Value Date   GLUCAP 272 (H) 11/23/2018   HGBA1C 5.8 (H) 11/24/2018    Review of Glycemic Control  Diabetes history: No hx Outpatient Diabetes medications: None Current orders for Inpatient glycemic control: None  HgbA1C - 5.8% - pre-diabetes Blood sugars above goal of 180 mg/dL.  Inpatient Diabetes Program Recommendations:     Add Novolog 0-9 units Q4H while NPO.  Will follow.  Thank you. Lorenda Peck, RD, LDN, CDE Inpatient Diabetes Coordinator [phone]

## 2018-11-24 NOTE — ED Notes (Signed)
Pt tolerating being off of bipap

## 2018-11-24 NOTE — Progress Notes (Signed)
TR band removed and R radial site dressed with sterile 2x2 gauze and tegaderm. Pulse 2+. Site Level 0. On assessment, patient O2 sat noted to be 88-89% consistently. Patient placed on 2L [place] and O2 sat increased to 93%. Patient comfortable and in NAD at this time. Call light in reach, stretcher locked in lowest position.

## 2018-11-24 NOTE — Consult Note (Addendum)
Cardiology Consultation:   Patient ID: Curtis Kickaul Rahe MRN: [id number]; DOB: [birth year]  Admit date: 11/23/2018 Date of Consult: 11/24/2018  Primary Care Provider: Patient, [name]: Chrystie NoseKenneth C Hilty, MD  Primary Electrophysiologist:  None    Patient Profile:   Curtis [name] is a 68 y.o. male with history of tobacco use who is being seen today for the evaluation of NSTEMI in the setting of acute shortness of breath at the request of  Dr. Arlean HoppingSchertz.  History of Present Illness:   The patient has no past cardiac history. He has not been to the doctor in about 7 years. Not on any home medications.   [name] presented to the ER via EMS for acute onset shortness of breath that started at 10 PM yesterday. Onset was sudden and woke patient up out of sleep. Prior to patient was in his normal state of health. Denies h/o chest pain. On arrival to the ER he was noted to be in significant distress, diaphoretic, tachycardic, and tachypneic. Oxygen saturation aws 60% on RA and improved to 84% with BVM. EMS reported syncopal episodes on the way but reportedly the patient never lost pulses. He was noted to be in sinus tachycardia. He was given epinephrin 0.3 mg, IV mag 2 g, and IV solumedrol 125 mg in route.   Upon arrival he was awake and alert. BP 184/105, pulse 130, afebrile, RR34. He was placed on Bipap. ABG with pH 7.21, PCO2 63, PO2 408. WBC 21.1. BNP 507. Potassium 3.3, BG 271. HR remained high in the 150s and systolic's were elevated to the 200s. EKG showed narrow complex tachycardia, 140 bpm. The patient received adenosine 6 mg. Repeat EKG showed Sinus tach with LBBB, 102 bpm. There are no previous tracings for comparison. BUN 16 and creatinine 1.4.  HS troponin 63. CXR showed mild cardiomegaly and moderate pulmonary edema. Patient recieved NTG 2% ointment as well. He received IV Lasix 40 mg. Repeat ABG was improved. No previous labs for comparison.   Second HS troponin came back 4477.   On exam patient is awake and alert. He denies chest pain. Breathing improved, still on 4L O2, not acutely short of breath. Denies recent lower leg swelling or orthopnea. Says he is compliant with a low-salt diet and does not eat out often. He is moderately active around the house. 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 Patient is a smoker since his teens, 1 pak will last 5 days. Denies alcohol and drug use. Denies any history of hypertension, hyperlipidemia, diabetes, stroke, heart attack, or arrhythmia. Family history positive for MI in his father at 9748 and MI in his mother.   Heart Pathway Score:     Past Medical History:  Diagnosis Date  . Tobacco use     History reviewed. No pertinent surgical history.   Home Medications:  Prior to Admission medications   Not on File    Inpatient Medications: Scheduled Meds: . furosemide  40 mg Intravenous BID   Continuous Infusions: . heparin 1,200 Units/hr (11/24/18 0719)   PRN Meds: acetaminophen **OR** acetaminophen, hydrALAZINE  Allergies:    Allergies  Allergen Reactions  . Penicillins     Social History:   Social History   Socioeconomic History  . Marital status: Single    Spouse name: Not on file  . Number of children: Not on file  . Years of education: Not on file  . Highest education level: Not on file  Occupational History  . Not  on file  Social Needs  . Financial resource strain: Not on file  . Food insecurity    Worry: Not on file    Inability: Not on file  . Transportation needs    Medical: Not on file    Non-medical: Not on file  Tobacco Use  . Smoking status: Current Some Day Smoker    Packs/day: 0.25    Types: Cigarettes  . Smokeless tobacco: Never Used  Substance and Sexual Activity  . Alcohol use: Never    Frequency: Never  . Drug use: Never  . Sexual activity: Not on file  Lifestyle  . Physical activity    Days per week: Not on file    Minutes per session: Not on file  . Stress: Not on file   Relationships  . Social Musician on phone: Not on file    Gets together: Not on file    Attends religious service: Not on file    Active member of club or organization: Not on file    Attends meetings of clubs or organizations: Not on file    Relationship status: Not on file  . Intimate partner violence    Fear of current or ex partner: Not on file    Emotionally abused: Not on file    Physically abused: Not on file    Forced sexual activity: Not on file  Other Topics Concern  . Not on file  Social History Narrative  . Not on file    Family History:   Family History  Problem Relation Age of Onset  . Heart attack Father   . Heart attack Mother      ROS:  Please see the history of present illness.  All other ROS reviewed and negative.     Physical Exam/Data:   Vitals:   11/24/18 0615 11/24/18 0630 11/24/18 0638 11/24/18 0715  BP: (!) 152/88 (!) 154/86  (!) 152/88  Pulse:    89  Resp: (!) 22 (!) 22  (!) 22  Temp:      TempSrc:      SpO2:    96%  Weight:   94.1 kg   Height:   5\' 9"  (1.753 m)     Intake/Output Summary (Last 24 hours) at 11/24/2018 0739 Last data filed at 11/24/2018 0554 Gross per 24 hour  Intake -  Output 750 ml  Net -750 ml   Last 3 Weights 11/24/2018  Weight (lbs) 207 lb 7.3 oz  Weight (kg) 94.1 kg     Body mass index is 30.64 kg/m.  General:  Well nourished, well developed, in no acute distress HEENT: normal Lymph: no adenopathy Neck: no JVD Endocrine:  No thryomegaly Vascular: No carotid bruits; FA pulses 2+ bilaterally without bruits  Cardiac:  normal S1, S2; RRR; no murmur  Lungs:  Crackles bilaterally, no wheezing, rhonchi or rales  Abd: soft, nontender, no hepatomegaly  Ext: no edema Musculoskeletal:  No deformities, BUE and BLE strength normal and equal Skin: warm and dry  Neuro:  CNs 2-12 intact, no focal abnormalities noted Psych:  Normal affect   EKG:  The EKG was personally reviewed and demonstrates:   1:  Narrow complex tachycardia, 140 bpm, QRS 148 ms 2: Sinus tach, 102, LBBB (QRS 11/26/2018) Telemetry:  Telemetry was personally reviewed and demonstrates:  Narrow complex tachycardia rates to the 140s. Now NSR, rates 90-100  Relevant CV Studies:  Echo pending  Laboratory Data:  High Sensitivity Troponin:   Recent  Labs  Lab 11/23/18 2328 11/24/18 0327  TROPONINIHS 63* 4,477*     Chemistry Recent Labs  Lab 11/23/18 2328  11/23/18 2351 11/24/18 0106 11/24/18 0327  NA 139   < > 139 138 139  K 3.8   < > 3.7 3.3* 3.6  CL 102  --  102  --  102  CO2 23  --   --   --  23  GLUCOSE 278*  --  271*  --  192*  BUN 16  --  22  --  15  CREATININE 1.42*  --  1.30*  --  1.30*  CALCIUM 8.5*  --   --   --  8.8*  GFRNONAA 51*  --   --   --  57*  GFRAA 60*  --   --   --  >60  ANIONGAP 14  --   --   --  14   < > = values in this interval not displayed.    Recent Labs  Lab 11/23/18 2328 11/24/18 0327  PROT 7.0 7.0  ALBUMIN 3.8 3.9  AST 45* 59*  ALT 30 38  ALKPHOS 89 80  BILITOT 2.2* 1.4*   Hematology Recent Labs  Lab 11/23/18 2328  11/23/18 2351 11/24/18 0106 11/24/18 0337  WBC 21.1*  --   --   --  18.3*  RBC 4.80  --   --   --  4.92  HGB 16.6   < > 17.0 15.3 16.2  HCT 49.0   < > 50.0 45.0 48.7  MCV 102.1*  --   --   --  99.0  MCH 34.6*  --   --   --  32.9  MCHC 33.9  --   --   --  33.3  RDW 12.0  --   --   --  12.1  PLT 395  --   --   --  285   < > = values in this interval not displayed.   BNP Recent Labs  Lab 11/23/18 2328  BNP 507.9*    DDimer No results for input(s): DDIMER in the last 168 hours.   Radiology/Studies:  Dg Chest Portable 1 View  Result Date: 11/24/2018 CLINICAL DATA:  Shortness of breath. EXAM: PORTABLE CHEST 1 VIEW COMPARISON:  None. FINDINGS: Mild cardiomegaly. Aortic atherosclerosis. Diffuse interstitial prominence with Kerley B-lines consistent with pulmonary edema. No confluent airspace disease. Left costophrenic angle not entirely included in  the field of view. No large pleural effusion. No pneumothorax. No acute osseous abnormalities are seen. IMPRESSION: 1. Mild cardiomegaly.  Moderate pulmonary edema. 2.  Aortic Atherosclerosis (ICD10-I70.0). Electronically Signed   By: Narda Rutherford M.D.   On: 11/24/2018 00:01    Assessment and Plan:   NSTEMI/Elevated Troponin Patient presented with acute respiratory failure - Hs troponin 63 >> 4477 - Concern for acute ACS - EKG shows LBBB, no old tracings for comparison - Patient was started on Aspirin, heparin - Patient with no h/o of anginal symptoms. Currently chest pain free - Cycle troponins - Likely will go for cath today to evaluate for ischemia>> keep NPO Risks and benefits of cardiac catheterization have been discussed with the patient.  These include bleeding, infection, kidney damage, stroke, heart attack, death.  The patient understands these risks and is willing to proceed. - Creatinine 1.4 on admission > now 1.3 - Echo pending - Suspect patient has unknown risk factors since patient has not seen a doctor in many years.  -  Will check lipids  - further recommendations per cath - MD to see  Acute respiratory failure  Patient had initial O2 60%, improved with BVM. He was placed on Bipap and ABG improved. Systolic's in the 726O. CXR showed cardiomegaly and pulmonary edema. BNP 507. Received IV lasix and nitropaste in the ER.  - Unsure etiology of sob, heart failure vs flash pulmonary edema.  - Patient improved after IV lasix 40mg , continue BID - Echo pending - Still on 4L, O2 >> not on O2 at baseline - On exam patient breathing comfortably  HTN Presented with systolic's up to the 035D. Now improved with systolics in the 974B  - Suspect patient has had high pressures for a while - IV hydralazine PRN - suspect medication changes post cath  Tachycardia Eevated to the 150s, was given adenosine 6 mg in the ED. Repeat EKG showed sinus tach, 102 bpm - HR now 90s - pending  TSH  Renal insufficieny On admission 1.42, now improved to 1.3 - Avoid nephrotoxic agents  Leukocytosis - WBC up to 21.2 - COVID negative - UA negative for infection - Follow wbc trend - per IM  Hyperglycemia - 278 BG - A1C 5.8   For questions or updates, please contact [facility] Please consult [email or web address] for contact info under     Signed, Bernisha Verma Ninfa Meeker, PA-C  11/24/2018 7:39 AM

## 2018-11-24 NOTE — ED Notes (Signed)
Pt speaking full sentences at present, reports feeling better on bipap

## 2018-11-24 NOTE — ED Notes (Signed)
Neighbor called wants status update-- stated he helps patient out and he is also a Education officer, museum with [employer]--[name]  @ [phone] advised his mom has dementia and no other family

## 2018-11-24 NOTE — Progress Notes (Signed)
[facility] Progress Note  Subjective: feeling much better, on 4L [place] now, no CP or SOB.   Vitals:   11/24/18 0638 11/24/18 0715 11/24/18 0745 11/24/18 0830  BP:  (!) 152/88 (!) 154/86 (!) 149/91  Pulse:  89 94 96  Resp:  (!) 22 (!) 25 (!) 25  Temp:      TempSrc:      SpO2:  96% 94% 95%  Weight: 94.1 kg     Height: '5\' 9"'$  (1.753 m)       Inpatient medications: . furosemide  40 mg Intravenous BID   . heparin 1,200 Units/hr (11/24/18 0719)   acetaminophen **OR** acetaminophen, hydrALAZINE  Exam:  alert, on nasal O2 now  no jvd  Chest mostly CTA bilat, no wheeizng, no distress, calm   Cor reg no mrg   aBd soft ntnd no ascites or hsm   Ext no leg or UE edema   NF, Ox 3   HPI:  Curtis [name] is a 68 y.o. male with no significant past medical history presenting to the hospital via EMS for evaluation of sudden onset shortness of breath which started at 10 PM yesterday.  On EMS arrival, patient was noted to be pale and diaphoretic.  Noted to be in significant distress, diaphoretic, tachycardic, and tachypneic.  Oxygen saturation 60% on room air, improved to 84% with BVM.  EMS reported multiple syncopal episodes in route but patient never lost pulses and stayed in sinus tachycardia.  He was given epinephrine 0.3 mg, IV magnesium 2 g, and IV Solu-Medrol 125 mg in route.  Patient states last night around 10 PM he woke up from his sleep feeling very short of breath.  He was feeling fine during the day and was not having any difficulty breathing.  Denies chest pain, cough, fevers, or chills.  Denies history of hypertension or congestive heart failure.  States the last time he saw a doctor was 7 years ago.  He is currently not on any medications.  No other complaints.  ED Course: Awake and alert on arrival to the ED.  Tachycardic and tachypneic.  Placed on BiPAP.  ABG with pH 7.21, PCO2 63, and PO2 408. Systolic >237.  Afebrile.  White count 21.1 with left shift.  BNP 507.  High-sensitivity  troponin 63.  Heart rate persistently in the 150s in the ED and initial EKG showing narrow complex tachycardia.  BUN 16, creatinine 1.4.  No prior labs for comparison.  T bili 2.2, AST 45.  Alk phos and ALT normal.  SARS-CoV-2 test negative.  Chest x-ray showing mild cardiomegaly and moderate pulmonary edema. Patient received adenosine 6 mg, nitroglycerin 2% ointment, and Lasix 40 mg in the ED.  Repeat ABG showing improvement with pH 7.35 and PCO2 44.  Repeat EKG with sinus tachycardia, LBBB, and T wave inversions in inferior leads.  No prior EKG for comparison.       Hospital Course:  Acute hypoxic hypercapnic respiratory failure: new onset CHF vs flash pulm edema from HTN emergency.  No hx HTN but hasn't seen doctor in 7 yrs. Oxygen saturation 60% on room air and required BVM ventilation.  Initial ABG with pH 7.21 and PCO2 63.  Placed on BiPAP and subsequent ABG showing improvement of acidosis and hypercapnia. Systolic blood pressure above 200 on arrival to the ED.  BNP 507. Chest x-ray showing mild cardiomegaly and moderate pulmonary edema.  Work of breathing and blood pressure improved significantly after BiPAP, IV Lasix, and Nitropaste.  Currently requiring 4 L supplemental oxygen. -Monitor closely in the progressive care unit -Received IV Lasix 40 mg daily - Continue IV Lasix 40 mg twice daily starting in the morning. -Echocardiogram -Monitor intake and output, daily weights, low-sodium diet with fluid restriction -Continue supplemental oxygen, wean as tolerated -Systolic currently in 628Z.  IV hydralazine PRN - Cardiology consulting  Elevated troponin/ NSTEMI: concern for ACS. Initial high-sensitivity troponin 63, repeat significantly elevated at 4477.  Repeat EKG continues to show left bundle branch block (no old ekg) and worsening QTC prolongation (QTc 526). -Aspirin 324 mg -Heparin infusion started -Continue to trend troponin -Check magnesium level -Cardiology has seen pt, keep NPO ,  may need cath later  HTN urgency: BP's 180/ 105 to > 200's on presentation. Has not seen doctor in 7 years.  BP's down w/ IV ntg and lasix.  - follow serial BP's  - cardiology following will hold off on initiating BP meds as pt is pending ischemia/ CHF work-up  Renal Insufficiency BUN 16, creatinine 1.4.  No prior labs for comparison.   -Repeat BMP in a.m. -Monitor urine output - check UA  Hyperglycemia Random blood glucose 278. -Check A1c - start SSI ac and hs  Leukocytosis Afebrile.  White count 21.1 with left shift.  Dyspnea was acute onset and chest x-ray with findings consistent with pulmonary edema.  SARS-CoV-2 test negative.  Not endorsing any infectious symptoms. -Repeat CBC in a.m. -Check procalcitonin level -UA pending  Tachycardia Heart rate persistently in the 150s and initial EKG showing narrow complex tachycardia.  Patient received adenosine 6 mg in the ED.  Repeat EKG showing sinus tachycardia.   - Heart rate currently improved and in the 80s to 90s. -Cardiac monitoring -Check TSH, free T4 levels   Elevated T bili T bili 2.2, no significant elevation of remainder of LFTs. -Check fractionated bilirubin level    DVT proph: full dose IV heparin Code Status: Patient wishes to be full code. Family Communication: No family available. Disposition Plan: Anticipate discharge after clinical improvement.  No charge (pt admit after MN)   Kelly Splinter / [facility] [phone] 11/24/2018, 8:51 AM   Recent Labs  Lab 11/23/18 2328  11/23/18 2351 11/24/18 0106 11/24/18 0327  NA 139   < > 139 138 139  K 3.8   < > 3.7 3.3* 3.6  CL 102  --  102  --  102  CO2 23  --   --   --  23  GLUCOSE 278*  --  271*  --  192*  BUN 16  --  22  --  15  CREATININE 1.42*  --  1.30*  --  1.30*  CALCIUM 8.5*  --   --   --  8.8*   < > = values in this interval not displayed.   Recent Labs  Lab 11/23/18 2328 11/24/18 0327  AST 45* 59*  ALT 30 38  ALKPHOS 89 80  BILITOT 2.2*  1.4*  PROT 7.0 7.0  ALBUMIN 3.8 3.9   Recent Labs  Lab 11/23/18 2328  11/23/18 2351 11/24/18 0106 11/24/18 0337  WBC 21.1*  --   --   --  18.3*  NEUTROABS 12.9*  --   --   --   --   HGB 16.6   < > 17.0 15.3 16.2  HCT 49.0   < > 50.0 45.0 48.7  MCV 102.1*  --   --   --  99.0  PLT 395  --   --   --  285   < > = values in this interval not displayed.   Iron/TIBC/Ferritin/ %Sat No results found for: IRON, TIBC, FERRITIN, IRONPCTSAT

## 2018-11-24 NOTE — ED Notes (Signed)
Pt's respiratory effort seems to have improved on bipap; bp and heart rate improved

## 2018-11-24 NOTE — Interval H&P Note (Signed)
Cath Lab Visit (complete for each Cath Lab visit)  Clinical Evaluation Leading to the Procedure:   ACS: Yes.    Non-ACS:    Anginal Classification: CCS IV  Anti-ischemic medical therapy: Minimal Therapy (1 class of medications)  Non-Invasive Test Results: No non-invasive testing performed  Prior CABG: No previous CABG      History and Physical Interval Note:  11/24/2018 3:34 PM  Curtis [name]  has presented today for surgery, with the diagnosis of n stemi.  The various methods of treatment have been discussed with the patient and family. After consideration of risks, benefits and other options for treatment, the patient has consented to  Procedure(s): LEFT HEART CATH AND CORONARY ANGIOGRAPHY (N/A) as a surgical intervention.  The patient's history has been reviewed, patient examined, no change in status, stable for surgery.  I have reviewed the patient's chart and labs.  Questions were answered to the patient's satisfaction.     Belva Crome III

## 2018-11-24 NOTE — ED Notes (Signed)
Ordered breakfast--Curtis [name] 

## 2018-11-24 NOTE — Progress Notes (Signed)
ANTICOAGULATION CONSULT NOTE - Initial Consult  Pharmacy Consult for Heparin Indication: chest pain/ACS  Allergies  Allergen Reactions  . Penicillins     Patient Measurements:   Heparin Dosing Weight: 92kg  Vital Signs: Temp: 98.8 F (37.1 C) (09/22 0059) Temp Source: Rectal (09/22 0059) BP: 152/88 (09/22 0615) Pulse Rate: 90 (09/22 0545)  Labs: Recent Labs    11/23/18 2328  11/23/18 2351 11/24/18 0106 11/24/18 0327 11/24/18 0337  HGB 16.6   < > 17.0 15.3  --  16.2  HCT 49.0   < > 50.0 45.0  --  48.7  PLT 395  --   --   --   --  285  CREATININE 1.42*  --  1.30*  --  1.30*  --   TROPONINIHS 63*  --   --   --  4,477*  --    < > = values in this interval not displayed.    CrCl cannot be calculated (Unknown ideal weight.).   Medical History: History reviewed. No pertinent past medical history.  Assessment: 57 YOM presenting with SOB, concern for ACS.  No anticoagulation PTA, CBC wnl.  SQ heparin for DVT ppx given 9/22 @0549   Goal of Therapy:  Heparin level 0.3-0.7 units/ml Monitor platelets by anticoagulation protocol: Yes   Plan:  Heparin 2000 units IV x1, and gtt at 1200 units/hr F/u 6 hour heparin level, cards eval  Bertis Ruddy, PharmD Clinical Pharmacist Please check AMION for all [place] numbers 11/24/2018 6:39 AM

## 2018-11-24 NOTE — Progress Notes (Signed)
3 cc's removed. Total of 5 CC's in TR Band.  Site CDI. No complaints.

## 2018-11-24 NOTE — H&P (Addendum)
History and Physical    Curtis [name] NAT:[id number] DOB: [birth year] DOA: 11/23/2018  PCP: Patient, No Pcp Per Patient coming from: Home  Chief Complaint: Shortness of breath  HPI: Curtis [name] is a 68 y.o. male with no significant past medical history presenting to the hospital via EMS for evaluation of sudden onset shortness of breath which started at 10 PM yesterday.  On EMS arrival, patient was noted to be pale and diaphoretic.  Noted to be in significant distress, diaphoretic, tachycardic, and tachypneic.  Oxygen saturation 60% on room air, improved to 84% with BVM.  EMS reported multiple syncopal episodes in route but patient never lost pulses and stayed in sinus tachycardia.  He was given epinephrine 0.3 mg, IV magnesium 2 g, and IV Solu-Medrol 125 mg in route.  Patient states last night around 10 PM he woke up from his sleep feeling very short of breath.  He was feeling fine during the day and was not having any difficulty breathing.  Denies chest pain, cough, fevers, or chills.  Denies history of hypertension or congestive heart failure.  States the last time he saw a doctor was 7 years ago.  He is currently not on any medications.  No other complaints.  ED Course: Awake and alert on arrival to the ED.  Tachycardic and tachypneic.  Placed on BiPAP.  ABG with pH 7.21, PCO2 63, and PO2 408. Systolic >427.  Afebrile.  White count 21.1 with left shift.  BNP 507.  High-sensitivity troponin 63.  Heart rate persistently in the 150s in the ED and initial EKG showing narrow complex tachycardia.  BUN 16, creatinine 1.4.  No prior labs for comparison.  T bili 2.2, AST 45.  Alk phos and ALT normal.  SARS-CoV-2 test negative.  Chest x-ray showing mild cardiomegaly and moderate pulmonary edema. Patient received adenosine 6 mg, nitroglycerin 2% ointment, and Lasix 40 mg in the ED.  Repeat ABG showing improvement with pH 7.35 and PCO2 44.  Repeat EKG with sinus tachycardia, LBBB, and T wave inversions in inferior  leads.  No prior EKG for comparison.  Review of Systems:  All systems reviewed and apart from history of presenting illness, are negative.  History reviewed. No pertinent past medical history.  History reviewed. No pertinent surgical history.   reports that he has been smoking cigarettes. He has been smoking about 0.25 packs per day. He has never used smokeless tobacco. He reports that he does not drink alcohol or use drugs.  Allergies  Allergen Reactions  . Penicillins     Family History  Problem Relation Age of Onset  . Heart attack Father     Prior to Admission medications   Not on File    Physical Exam: Vitals:   11/24/18 0300 11/24/18 0315 11/24/18 0330 11/24/18 0345  BP: (!) 147/84 (!) 145/93 (!) 143/88 (!) 143/86  Pulse: 87 91 89 87  Resp: (!) 21 (!) 29 (!) 21 (!) 24  Temp:      TempSrc:      SpO2: 92% 93% 94% 95%    Physical Exam  Constitutional: He is oriented to person, place, and time. He appears well-developed and well-nourished. No distress.  HENT:  Head: Normocephalic.  Mouth/Throat: Oropharynx is clear and moist.  Eyes: Right eye exhibits no discharge. Left eye exhibits no discharge.  Neck: Neck supple. JVD present.  Cardiovascular: Normal rate, regular rhythm and intact distal pulses.  Pulmonary/Chest: He has no wheezes.  Slightly tachypneic Speaking clearly in full sentences  Bibasilar rales  Abdominal: Soft. Bowel sounds are normal. He exhibits no distension. There is no abdominal tenderness. There is no guarding.  Musculoskeletal:        General: No edema.  Neurological: He is alert and oriented to person, place, and time.  Skin: Skin is warm and dry. He is not diaphoretic.     Labs on Admission: I have personally reviewed following labs and imaging studies  CBC: Recent Labs  Lab 11/23/18 2328 11/23/18 2339 11/23/18 2351 11/24/18 0106 11/24/18 0337  WBC 21.1*  --   --   --  18.3*  NEUTROABS 12.9*  --   --   --   --   HGB 16.6 16.3  17.0 15.3 16.2  HCT 49.0 48.0 50.0 45.0 48.7  MCV 102.1*  --   --   --  99.0  PLT 395  --   --   --  742   Basic Metabolic Panel: Recent Labs  Lab 11/23/18 2328 11/23/18 2339 11/23/18 2351 11/24/18 0106  NA 139 137 139 138  K 3.8 3.3* 3.7 3.3*  CL 102  --  102  --   CO2 23  --   --   --   GLUCOSE 278*  --  271*  --   BUN 16  --  22  --   CREATININE 1.42*  --  1.30*  --   CALCIUM 8.5*  --   --   --    GFR: CrCl cannot be calculated (Unknown ideal weight.). Liver Function Tests: Recent Labs  Lab 11/23/18 2328  AST 45*  ALT 30  ALKPHOS 89  BILITOT 2.2*  PROT 7.0  ALBUMIN 3.8   No results for input(s): LIPASE, AMYLASE in the last 168 hours. No results for input(s): AMMONIA in the last 168 hours. Coagulation Profile: No results for input(s): INR, PROTIME in the last 168 hours. Cardiac Enzymes: No results for input(s): CKTOTAL, CKMB, CKMBINDEX, TROPONINI in the last 168 hours. BNP (last 3 results) No results for input(s): PROBNP in the last 8760 hours. HbA1C: Recent Labs    11/24/18 0337  HGBA1C 5.8*   CBG: Recent Labs  Lab 11/23/18 2326  GLUCAP 272*   Lipid Profile: No results for input(s): CHOL, HDL, LDLCALC, TRIG, CHOLHDL, LDLDIRECT in the last 72 hours. Thyroid Function Tests: No results for input(s): TSH, T4TOTAL, FREET4, T3FREE, THYROIDAB in the last 72 hours. Anemia Panel: No results for input(s): VITAMINB12, FOLATE, FERRITIN, TIBC, IRON, RETICCTPCT in the last 72 hours. Urine analysis: No results found for: COLORURINE, APPEARANCEUR, LABSPEC, [place], GLUCOSEU, HGBUR, BILIRUBINUR, KETONESUR, PROTEINUR, UROBILINOGEN, NITRITE, LEUKOCYTESUR  Radiological Exams on Admission: Dg Chest Portable 1 View  Result Date: 11/24/2018 CLINICAL DATA:  Shortness of breath. EXAM: PORTABLE CHEST 1 VIEW COMPARISON:  None. FINDINGS: Mild cardiomegaly. Aortic atherosclerosis. Diffuse interstitial prominence with Kerley B-lines consistent with pulmonary edema. No confluent  airspace disease. Left costophrenic angle not entirely included in the field of view. No large pleural effusion. No pneumothorax. No acute osseous abnormalities are seen. IMPRESSION: 1. Mild cardiomegaly.  Moderate pulmonary edema. 2.  Aortic Atherosclerosis (ICD10-I70.0). Electronically Signed   By: Keith Rake M.D.   On: 11/24/2018 00:01    EKG: Independently reviewed.  Initial EKG with V. tach and heart rate 140.  Repeat EKG with sinus tachycardia, LBBB, and T wave inversions in inferior leads.  No prior EKG for comparison.  Assessment/Plan Principal Problem:   Acute respiratory failure with hypoxia and hypercapnia (HCC) Active Problems:   Pulmonary  edema   Hypertensive emergency   Elevated troponin   Leukocytosis   Acute hypoxic hypercapnic respiratory failure secondary to new onset CHF versus flash pulmonary edema from hypertensive emergency Initially had significant respiratory distress with tachycardia and tachypnea.  Oxygen saturation 60% on room air and required BVM ventilation.  Initial ABG with pH 7.21 and PCO2 63.  Placed on BiPAP and subsequent ABG showing improvement of acidosis and hypercapnia.  SARS-CoV-2 test negative.  Systolic blood pressure above 200 on arrival to the ED.  BNP 507. Chest x-ray showing mild cardiomegaly and moderate pulmonary edema.  Work of breathing and blood pressure improved significantly after BiPAP, IV Lasix, and Nitropaste.  Currently requiring 4 L supplemental oxygen. -Monitor closely in the progressive care unit -Received IV Lasix 40 mg daily.  Continue IV Lasix 40 mg twice daily starting in the morning. -Echocardiogram -Monitor intake and output, daily weights, low-sodium diet with fluid restriction -Continue supplemental oxygen, wean as tolerated -Systolic currently in 998P.  IV hydralazine PRN.   Leukocytosis Afebrile.  White count 21.1 with left shift.  Dyspnea was acute onset and chest x-ray with findings consistent with pulmonary edema.   SARS-CoV-2 test negative.  Not endorsing any infectious symptoms. -Repeat CBC in a.m. -Check procalcitonin level -UA pending  Mild troponin elevation Likely secondary to demand ischemia from hypertensive emergency. High-sensitivity troponin 63.  EKG with LBBB and T wave inversions in inferior leads.  No prior EKG for comparison.  Patient denies chest pain and appears comfortable on exam. -Cardiac monitoring -Continue to trend troponin  Addendum: Concern for ACS.  Initial high-sensitivity troponin 63, repeat significantly elevated at 4477.  Patient was again seen at bedside.  He continues to be chest pain-free and is resting comfortably.  Repeat EKG continues to show left bundle branch block and worsening QTC prolongation (QTc 526). -Aspirin 324 mg -Heparin infusion -Continue to trend troponin -Check magnesium level -Discussed with Dr. Debara Pickett, cardiology will see the patient.  Tachycardia Heart rate persistently in the 150s and initial EKG showing narrow complex tachycardia.  Patient received adenosine 6 mg in the ED.  Repeat EKG showing sinus tachycardia.  Heart rate currently improved and in the 80s to 90s. -Cardiac monitoring -Check TSH, free T4 levels  Mildly elevated creatinine BUN 16, creatinine 1.4.  No prior labs for comparison.   -Repeat BMP in a.m. -Monitor urine output  Elevated T bili T bili 2.2, no significant elevation of remainder of LFTs. -Check fractionated bilirubin level  Hyperglycemia Random blood glucose 278. -Check A1c  DVT prophylaxis: Subcutaneous heparin Code Status: Patient wishes to be full code. Family Communication: No family available. Disposition Plan: Anticipate discharge after clinical improvement. Consults called: None Admission status: It is my clinical opinion that admission to INPATIENT is reasonable and necessary in this 68 y.o. male . presenting with symptoms of shortness of breath concerning for acute hypoxic respiratory failure from  pulmonary edema due to hypertensive emergency versus new onset CHF.  Has a new supplemental oxygen requirement.  Requiring IV Lasix for diuresis.  Given the aforementioned, the predictability of an adverse outcome is felt to be significant. I expect that the patient will require at least 2 midnights in the hospital to treat this condition.   The medical decision making on this patient was of high complexity and the patient is at high risk for clinical deterioration, therefore this is a level 3 visit.  Shela Leff MD [facility] Pager [phone]  If 7PM-7AM, please contact night-coverage [email or web address] Password  [email or web address]  11/24/2018, 4:52 AM

## 2018-11-24 NOTE — ED Notes (Signed)
All pt's belongings- clothes, phone wallet sent with him to cath lab

## 2018-11-24 NOTE — H&P (View-Only) (Signed)
Cardiology Consultation:   Patient ID: Curtis [name] MRN: [id number]; DOB: 08/02/1950  Admit date: 11/23/2018 Date of Consult: 11/24/2018  Primary Care Provider: Patient, [name]: Kenneth C Hilty, MD  Primary Electrophysiologist:  None    Patient Profile:   Curtis [name] is a 67 y.o. male with history of tobacco use who is being seen today for the evaluation of NSTEMI in the setting of acute shortness of breath at the request of  Dr. Schertz.  History of Present Illness:   The patient has no past cardiac history. He has not been to the doctor in about 7 years. Not on any home medications.   Curtis [name] presented to the ER via EMS for acute onset shortness of breath that started at 10 PM yesterday. Onset was sudden and woke patient up out of sleep. Prior to patient was in his normal state of health. Denies h/o chest pain. On arrival to the ER he was noted to be in significant distress, diaphoretic, tachycardic, and tachypneic. Oxygen saturation aws 60% on RA and improved to 84% with BVM. EMS reported syncopal episodes on the way but reportedly the patient never lost pulses. He was noted to be in sinus tachycardia. He was given epinephrin 0.3 mg, IV mag 2 g, and IV solumedrol 125 mg in route.   Upon arrival he was awake and alert. BP 184/105, pulse 130, afebrile, RR34. He was placed on Bipap. ABG with pH 7.21, PCO2 63, PO2 408. WBC 21.1. BNP 507. Potassium 3.3, BG 271. HR remained high in the 150s and systolic's were elevated to the 200s. EKG showed narrow complex tachycardia, 140 bpm. The patient received adenosine 6 mg. Repeat EKG showed Sinus tach with LBBB, 102 bpm. There are no previous tracings for comparison. BUN 16 and creatinine 1.4.  HS troponin 63. CXR showed mild cardiomegaly and moderate pulmonary edema. Patient recieved NTG 2% ointment as well. He received IV Lasix 40 mg. Repeat ABG was improved. No previous labs for comparison.   Second HS troponin came back 4477.   On exam patient is awake and alert. He denies chest pain. Breathing improved, still on 4L O2, not acutely short of breath. Denies recent lower leg swelling or orthopnea. Says he is compliant with a low-salt diet and does not eat out often. He is moderately active around the house. 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 Patient is a smoker since his teens, 1 pak will last 5 days. Denies alcohol and drug use. Denies any history of hypertension, hyperlipidemia, diabetes, stroke, heart attack, or arrhythmia. Family history positive for MI in his father at 48 and MI in his mother.   Heart Pathway Score:     Past Medical History:  Diagnosis Date  . Tobacco use     History reviewed. No pertinent surgical history.   Home Medications:  Prior to Admission medications   Not on File    Inpatient Medications: Scheduled Meds: . furosemide  40 mg Intravenous BID   Continuous Infusions: . heparin 1,200 Units/hr (11/24/18 0719)   PRN Meds: acetaminophen **OR** acetaminophen, hydrALAZINE  Allergies:    Allergies  Allergen Reactions  . Penicillins     Social History:   Social History   Socioeconomic History  . Marital status: Single    Spouse name: Not on file  . Number of children: Not on file  . Years of education: Not on file  . Highest education level: Not on file  Occupational History  . Not   on file  Social Needs  . Financial resource strain: Not on file  . Food insecurity    Worry: Not on file    Inability: Not on file  . Transportation needs    Medical: Not on file    Non-medical: Not on file  Tobacco Use  . Smoking status: Current Some Day Smoker    Packs/day: 0.25    Types: Cigarettes  . Smokeless tobacco: Never Used  Substance and Sexual Activity  . Alcohol use: Never    Frequency: Never  . Drug use: Never  . Sexual activity: Not on file  Lifestyle  . Physical activity    Days per week: Not on file    Minutes per session: Not on file  . Stress: Not on file   Relationships  . Social Musician on phone: Not on file    Gets together: Not on file    Attends religious service: Not on file    Active member of club or organization: Not on file    Attends meetings of clubs or organizations: Not on file    Relationship status: Not on file  . Intimate partner violence    Fear of current or ex partner: Not on file    Emotionally abused: Not on file    Physically abused: Not on file    Forced sexual activity: Not on file  Other Topics Concern  . Not on file  Social History Narrative  . Not on file    Family History:   Family History  Problem Relation Age of Onset  . Heart attack Father   . Heart attack Mother      ROS:  Please see the history of present illness.  All other ROS reviewed and negative.     Physical Exam/Data:   Vitals:   11/24/18 0615 11/24/18 0630 11/24/18 0638 11/24/18 0715  BP: (!) 152/88 (!) 154/86  (!) 152/88  Pulse:    89  Resp: (!) 22 (!) 22  (!) 22  Temp:      TempSrc:      SpO2:    96%  Weight:   94.1 kg   Height:   5\' 9"  (1.753 m)     Intake/Output Summary (Last 24 hours) at 11/24/2018 0739 Last data filed at 11/24/2018 0554 Gross per 24 hour  Intake -  Output 750 ml  Net -750 ml   Last 3 Weights 11/24/2018  Weight (lbs) 207 lb 7.3 oz  Weight (kg) 94.1 kg     Body mass index is 30.64 kg/m.  General:  Well nourished, well developed, in no acute distress HEENT: normal Lymph: no adenopathy Neck: no JVD Endocrine:  No thryomegaly Vascular: No carotid bruits; FA pulses 2+ bilaterally without bruits  Cardiac:  normal S1, S2; RRR; no murmur  Lungs:  Crackles bilaterally, no wheezing, rhonchi or rales  Abd: soft, nontender, no hepatomegaly  Ext: no edema Musculoskeletal:  No deformities, BUE and BLE strength normal and equal Skin: warm and dry  Neuro:  CNs 2-12 intact, no focal abnormalities noted Psych:  Normal affect   EKG:  The EKG was personally reviewed and demonstrates:   1:  Narrow complex tachycardia, 140 bpm, QRS 148 ms 2: Sinus tach, 102, LBBB (QRS 11/26/2018) Telemetry:  Telemetry was personally reviewed and demonstrates:  Narrow complex tachycardia rates to the 140s. Now NSR, rates 90-100  Relevant CV Studies:  Echo pending  Laboratory Data:  High Sensitivity Troponin:   Recent  Labs  Lab 11/23/18 2328 11/24/18 0327  TROPONINIHS 63* 4,477*     Chemistry Recent Labs  Lab 11/23/18 2328  11/23/18 2351 11/24/18 0106 11/24/18 0327  NA 139   < > 139 138 139  K 3.8   < > 3.7 3.3* 3.6  CL 102  --  102  --  102  CO2 23  --   --   --  23  GLUCOSE 278*  --  271*  --  192*  BUN 16  --  22  --  15  CREATININE 1.42*  --  1.30*  --  1.30*  CALCIUM 8.5*  --   --   --  8.8*  GFRNONAA 51*  --   --   --  57*  GFRAA 60*  --   --   --  >60  ANIONGAP 14  --   --   --  14   < > = values in this interval not displayed.    Recent Labs  Lab 11/23/18 2328 11/24/18 0327  PROT 7.0 7.0  ALBUMIN 3.8 3.9  AST 45* 59*  ALT 30 38  ALKPHOS 89 80  BILITOT 2.2* 1.4*   Hematology Recent Labs  Lab 11/23/18 2328  11/23/18 2351 11/24/18 0106 11/24/18 0337  WBC 21.1*  --   --   --  18.3*  RBC 4.80  --   --   --  4.92  HGB 16.6   < > 17.0 15.3 16.2  HCT 49.0   < > 50.0 45.0 48.7  MCV 102.1*  --   --   --  99.0  MCH 34.6*  --   --   --  32.9  MCHC 33.9  --   --   --  33.3  RDW 12.0  --   --   --  12.1  PLT 395  --   --   --  285   < > = values in this interval not displayed.   BNP Recent Labs  Lab 11/23/18 2328  BNP 507.9*    DDimer No results for input(s): DDIMER in the last 168 hours.   Radiology/Studies:  Dg Chest Portable 1 View  Result Date: 11/24/2018 CLINICAL DATA:  Shortness of breath. EXAM: PORTABLE CHEST 1 VIEW COMPARISON:  None. FINDINGS: Mild cardiomegaly. Aortic atherosclerosis. Diffuse interstitial prominence with Kerley B-lines consistent with pulmonary edema. No confluent airspace disease. Left costophrenic angle not entirely included in  the field of view. No large pleural effusion. No pneumothorax. No acute osseous abnormalities are seen. IMPRESSION: 1. Mild cardiomegaly.  Moderate pulmonary edema. 2.  Aortic Atherosclerosis (ICD10-I70.0). Electronically Signed   By: Narda Rutherford M.D.   On: 11/24/2018 00:01    Assessment and Plan:   NSTEMI/Elevated Troponin Patient presented with acute respiratory failure - Hs troponin 63 >> 4477 - Concern for acute ACS - EKG shows LBBB, no old tracings for comparison - Patient was started on Aspirin, heparin - Patient with no h/o of anginal symptoms. Currently chest pain free - Cycle troponins - Likely will go for cath today to evaluate for ischemia>> keep NPO Risks and benefits of cardiac catheterization have been discussed with the patient.  These include bleeding, infection, kidney damage, stroke, heart attack, death.  The patient understands these risks and is willing to proceed. - Creatinine 1.4 on admission > now 1.3 - Echo pending - Suspect patient has unknown risk factors since patient has not seen a doctor in many years.  -  Will check lipids  - further recommendations per cath - MD to see  Acute respiratory failure  Patient had initial O2 60%, improved with BVM. He was placed on Bipap and ABG improved. Systolic's in the 726O. CXR showed cardiomegaly and pulmonary edema. BNP 507. Received IV lasix and nitropaste in the ER.  - Unsure etiology of sob, heart failure vs flash pulmonary edema.  - Patient improved after IV lasix 40mg , continue BID - Echo pending - Still on 4L, O2 >> not on O2 at baseline - On exam patient breathing comfortably  HTN Presented with systolic's up to the 035D. Now improved with systolics in the 974B  - Suspect patient has had high pressures for a while - IV hydralazine PRN - suspect medication changes post cath  Tachycardia Eevated to the 150s, was given adenosine 6 mg in the ED. Repeat EKG showed sinus tach, 102 bpm - HR now 90s - pending  TSH  Renal insufficieny On admission 1.42, now improved to 1.3 - Avoid nephrotoxic agents  Leukocytosis - WBC up to 21.2 - COVID negative - UA negative for infection - Follow wbc trend - per IM  Hyperglycemia - 278 BG - A1C 5.8   For questions or updates, please contact [facility] Please consult [email or web address] for contact info under     Signed, Katara Griner Ninfa Meeker, PA-C  11/24/2018 7:39 AM

## 2018-11-24 NOTE — Progress Notes (Signed)
3 cc's air removed from band.  Site cdi.  Patient states no pain.

## 2018-11-24 NOTE — ED Notes (Signed)
Obtained consent for pt's procedure. Notified pt cath lab said procedure will be between 1:30-2

## 2018-11-24 NOTE — Progress Notes (Signed)
3 cc's removed.  2 cc's of air remain in band. Site looks good. Slight amount of blood under band but does not look new.  Patient complains of no pain

## 2018-11-24 NOTE — Progress Notes (Signed)
ANTICOAGULATION CONSULT NOTE - Follow Up Consult  Pharmacy Consult for Heparin Indication: chest pain/ACS  Allergies  Allergen Reactions  . Penicillins Rash    Did it involve swelling of the face/tongue/throat, SOB, or low BP? Unknown Did it involve sudden or severe rash/hives, skin peeling, or any reaction on the inside of your mouth or nose? Yes Did you need to seek medical attention at a hospital or doctor's office? Unknown When did it last happen? childhood If all above answers are "NO", may proceed with cephalosporin use.     Patient Measurements: Height: 5\' 9"  (175.3 cm) Weight: 207 lb 7.3 oz (94.1 kg) IBW/kg (Calculated) : 70.7 Heparin Dosing Weight: 92 kg  Vital Signs: BP: 148/86 (09/22 1416) Pulse Rate: 96 (09/22 1416)  Labs: Recent Labs    11/23/18 2328  11/23/18 2351 11/24/18 0106 11/24/18 0327 11/24/18 0337 11/24/18 0820 11/24/18 1336  HGB 16.6   < > 17.0 15.3  --  16.2  --   --   HCT 49.0   < > 50.0 45.0  --  48.7  --   --   PLT 395  --   --   --   --  285  --   --   HEPARINUNFRC  --   --   --   --   --   --   --  0.16*  CREATININE 1.42*  --  1.30*  --  1.30*  --   --   --   TROPONINIHS 63*  --   --   --  4,477*  --  9,016*  --    < > = values in this interval not displayed.    Estimated Creatinine Clearance: 64.2 mL/min (A) (by C-G formula based on SCr of 1.3 mg/dL (H)).   Medications:  Infusions:  . sodium chloride    . heparin 1,200 Units/hr (11/24/18 0719)    Assessment: 68 year old male presenting with SOB, concern for ACS.  No anticoagulation PTA, CBC wnl.  Heparin was initiated this AM at 1200 units/hr with a low bolus due to recent SQ Heparin.   6 hour Heparin level is low at 0.16 on this rate. No issues with infusion and no bleeding reported per discussion with the RN. Will increase and recheck in 6 hours unless proceeds to Cath.   Goal of Therapy:  Heparin level 0.3-0.7 units/ml Monitor platelets by anticoagulation protocol:  Yes   Plan:  Increase Heparin to 1450 units/hr.  Follow-up plan for Cath today. Recheck Heparin level in 6 hours if remains on therapy.  Daily Heparin level and CBC while on therapy.   Sloan Leiter, PharmD, BCPS, BCCCP Clinical Pharmacist Clinical phone 11/24/2018 until 10:[phone] Please refer to AMION for [place] numbers 11/24/2018,2:57 PM

## 2018-11-24 NOTE — ED Notes (Signed)
Text page sent to [facility] with critical troponin of 4,477

## 2018-11-24 NOTE — CV Procedure (Addendum)
   Left heart cath via right radial using real-time vascular ultrasound for access.  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  Totally occluded codominant right coronary supplied by right to right and left to right collaterals.  PDA is diffusely diseased.  Just after the origin from the right coronary the PDA may be graftable.  Tubular 30 to 40% left main.  50% proximal LAD followed by 60 to 70% proximal LAD.  First diagonal with severe proximal disease  Codominant circumflex with occlusion of the first marginal, small second and third marginals, 85% mid to distal circumflex before the large branching fourth obtuse marginal.  Distal circumflex is then followed by 99% stenosis proximal to the circumflex PDA/left ventricular branch (likely too small to graft.  By echo LVEF is 25 to 30%.  LVEDP is 16 mmHg.  Findings consistent with compensated acute on chronic systolic and diastolic heart failure.  The patient has had a rapid turnaround from acute pulmonary edema.  Complex anatomy.  Refer for surgical consultation to determine if he is a candidate for multivessel bypass.

## 2018-11-25 ENCOUNTER — Other Ambulatory Visit: Payer: Self-pay | Admitting: *Deleted

## 2018-11-25 ENCOUNTER — Encounter (HOSPITAL_COMMUNITY): Payer: Self-pay | Admitting: Interventional Cardiology

## 2018-11-25 ENCOUNTER — Other Ambulatory Visit: Payer: Self-pay

## 2018-11-25 DIAGNOSIS — R778 Other specified abnormalities of plasma proteins: Secondary | ICD-10-CM

## 2018-11-25 DIAGNOSIS — I2510 Atherosclerotic heart disease of native coronary artery without angina pectoris: Secondary | ICD-10-CM

## 2018-11-25 DIAGNOSIS — I214 Non-ST elevation (NSTEMI) myocardial infarction: Secondary | ICD-10-CM

## 2018-11-25 DIAGNOSIS — I340 Nonrheumatic mitral (valve) insufficiency: Secondary | ICD-10-CM

## 2018-11-25 DIAGNOSIS — I25110 Atherosclerotic heart disease of native coronary artery with unstable angina pectoris: Secondary | ICD-10-CM

## 2018-11-25 DIAGNOSIS — I5021 Acute systolic (congestive) heart failure: Secondary | ICD-10-CM

## 2018-11-25 DIAGNOSIS — R7989 Other specified abnormal findings of blood chemistry: Secondary | ICD-10-CM

## 2018-11-25 HISTORY — DX: Other specified abnormal findings of blood chemistry: R79.89

## 2018-11-25 HISTORY — DX: Other specified abnormalities of plasma proteins: R77.8

## 2018-11-25 LAB — COMPREHENSIVE METABOLIC PANEL
ALT: 29 U/L (ref 0–44)
AST: 33 U/L (ref 15–41)
Albumin: 3.5 g/dL (ref 3.5–5.0)
Alkaline Phosphatase: 70 U/L (ref 38–126)
Anion gap: 11 (ref 5–15)
BUN: 24 mg/dL — ABNORMAL HIGH (ref 8–23)
CO2: 26 mmol/L (ref 22–32)
Calcium: 8.6 mg/dL — ABNORMAL LOW (ref 8.9–10.3)
Chloride: 104 mmol/L (ref 98–111)
Creatinine, Ser: 1.22 mg/dL (ref 0.61–1.24)
GFR calc Af Amer: >60 mL/min (ref >60)
GFR calc non Af Amer: >60 mL/min (ref >60)
Glucose, Bld: 134 mg/dL — ABNORMAL HIGH (ref 70–99)
Potassium: 3.2 mmol/L — ABNORMAL LOW (ref 3.5–5.1)
Sodium: 141 mmol/L (ref 135–145)
Total Bilirubin: 1.3 mg/dL — ABNORMAL HIGH (ref 0.3–1.2)
Total Protein: 6.7 g/dL (ref 6.5–8.1)

## 2018-11-25 LAB — CBC
HCT: 43.4 % (ref 39.0–52.0)
HCT: 47.6 % (ref 39.0–52.0)
Hemoglobin: 15.1 g/dL (ref 13.0–17.0)
Hemoglobin: 16.6 g/dL (ref 13.0–17.0)
MCH: 34 pg (ref 26.0–34.0)
MCH: 34 pg (ref 26.0–34.0)
MCHC: 34.8 g/dL (ref 30.0–36.0)
MCHC: 34.9 g/dL (ref 30.0–36.0)
MCV: 97.7 fL (ref 80.0–100.0)
MCV: 97.5 fL (ref 80.0–100.0)
Platelets: 271 10*3/uL (ref 150–400)
Platelets: 298 10*3/uL (ref 150–400)
RBC: 4.44 MIL/uL (ref 4.22–5.81)
RBC: 4.88 MIL/uL (ref 4.22–5.81)
RDW: 12.1 % (ref 11.5–15.5)
RDW: 12.2 % (ref 11.5–15.5)
WBC: 16.2 10*3/uL — ABNORMAL HIGH (ref 4.0–10.5)
WBC: 17 10*3/uL — ABNORMAL HIGH (ref 4.0–10.5)
nRBC: 0 % (ref 0.0–0.2)
nRBC: 0 % (ref 0.0–0.2)

## 2018-11-25 LAB — LIPID PANEL
Cholesterol: 233 mg/dL — ABNORMAL HIGH (ref 0–200)
HDL: 31 mg/dL — ABNORMAL LOW (ref >40)
LDL Cholesterol: 171 mg/dL — ABNORMAL HIGH (ref 0–99)
Total CHOL/HDL Ratio: 7.5 RATIO
Triglycerides: 155 mg/dL — ABNORMAL HIGH (ref <150)
VLDL: 31 mg/dL (ref 0–40)

## 2018-11-25 LAB — HEPARIN LEVEL (UNFRACTIONATED)
Heparin Unfractionated: 0.42 IU/mL (ref 0.30–0.70)
Heparin Unfractionated: 0.45 IU/mL (ref 0.30–0.70)

## 2018-11-25 MED ORDER — SODIUM CHLORIDE 0.9% FLUSH: 3.0000 mL | INTRAVENOUS | Status: DC | PRN

## 2018-11-25 MED ORDER — SODIUM CHLORIDE 0.9 % IV SOLN: 250.0000 mL | INTRAVENOUS | Status: DC | PRN

## 2018-11-25 MED ORDER — SODIUM CHLORIDE 0.9 % IV SOLN
INTRAVENOUS | Status: DC
  Administered 2018-11-25 – 2018-11-26 (×2): via INTRAVENOUS

## 2018-11-25 MED ORDER — CARVEDILOL 3.125 MG PO TABS
3.1250 mg | ORAL_TABLET | Freq: Two times a day (BID) | ORAL | Status: DC
  Administered 2018-11-25 – 2018-11-29 (×9): 3.125 mg via ORAL
  Filled 2018-11-25 (×9): qty 1

## 2018-11-25 MED ORDER — POTASSIUM CHLORIDE CRYS ER 20 MEQ PO TBCR
40.0000 meq | EXTENDED_RELEASE_TABLET | ORAL | Status: AC
  Administered 2018-11-25: 21:00:00 40 meq via ORAL
  Filled 2018-11-25: qty 2

## 2018-11-25 MED ORDER — ASPIRIN 81 MG PO CHEW
81.0000 mg | CHEWABLE_TABLET | Freq: Every day | ORAL | Status: DC
  Administered 2018-11-25 – 2018-11-29 (×5): 81 mg via ORAL
  Filled 2018-11-25 (×5): qty 1

## 2018-11-25 MED ORDER — POTASSIUM CHLORIDE CRYS ER 20 MEQ PO TBCR: 40.0000 meq | EXTENDED_RELEASE_TABLET | Freq: Once | ORAL | Status: DC

## 2018-11-25 MED ORDER — SODIUM CHLORIDE 0.9% FLUSH
3.0000 mL | Freq: Two times a day (BID) | INTRAVENOUS | Status: DC
  Administered 2018-11-25 – 2018-11-28 (×2): 3 mL via INTRAVENOUS

## 2018-11-25 MED FILL — Nitroglycerin IV Soln 100 MCG/ML in D5W: INTRA_ARTERIAL | Qty: 10 | Status: AC

## 2018-11-25 NOTE — TOC Initial Note (Addendum)
Transition of Care [facility]) - Initial/Assessment Note    Patient Details  Name: Curtis [name] MRN: [id number] Date of Birth: Nov 11, 1950  Transition of Care [facility]) CM/SW Contact:    Vinie Sill, [place] Phone Number: 11/25/2018, 4:07 PM  Clinical Narrative:                  CSW received call from patient's friend/contact Curtis [name]. He informed CSW patient lives in the home with his mother, Curtis [name] and he is her caregiver. He states the patient's mother has dementia and that neighbors and friends has been rotating staying with the her while he is in the hospital. Randall Hiss, states he has talked with the patient regarding care for his mother but he has been reluctant to address what needs to be in place for his mother during his absence at this time.  Per Curtis [name],patient insist on going home and getting better first, and expressed concerns about getting dementia after CABG or not surviving.   He explained if the patient was to return home he would benefit from [facility] with SW and RN.   CSW will continue to follow  Thurmond Butts, MSW, [facility] Clinical Social Worker [phone]    Barriers to Discharge: Continued Medical Work up   Patient Goals and CMS Choice        Expected Discharge Plan and Services                                                Prior Living Arrangements/Services   Lives with:: Self, Other (Comment)(mother)                   Activities of Daily Living Home Assistive Devices/Equipment: Eyeglasses ADL Screening (condition at time of admission) Patient's cognitive ability adequate to safely complete daily activities?: Yes Is the patient deaf or have difficulty hearing?: Yes Does the patient have difficulty seeing, even when wearing glasses/contacts?: No Does the patient have difficulty concentrating, remembering, or making decisions?: No Patient able to express need for assistance with ADLs?: Yes Does the patient have difficulty dressing or bathing?:  No Independently performs ADLs?: Yes (appropriate for developmental age) Does the patient have difficulty walking or climbing stairs?: No Weakness of Legs: None Weakness of Arms/Hands: None  Permission Sought/Granted                  Emotional Assessment   Attitude/Demeanor/Rapport: Unable to Assess Affect (typically observed): Unable to Assess Orientation: : Oriented to Self, Oriented to Place, Oriented to  Time, Oriented to Situation Alcohol / Substance Use: Not Applicable Psych Involvement: No (comment)  Admission diagnosis:  Respiratory acidosis [E87.2] Flash pulmonary edema ([place]) [J81.0] Hypoxia [R09.02] Hypertension, unspecified type [I10] Patient Active Problem List   Diagnosis Date Noted  . Acute respiratory failure with hypoxia and hypercapnia ([place]) 11/24/2018  . Pulmonary edema 11/24/2018  . Hypertensive emergency 11/24/2018  . Elevated troponin 11/24/2018  . Leukocytosis 11/24/2018  . NSTEMI (non-ST elevated myocardial infarction) ([place])   . Flash pulmonary edema ([place])   . Hypertension    PCP:  Patient, No Pcp Per Pharmacy:   [facility], [place] [address] Phone: [phone] Fax: [phone]     Social Determinants of Health (SDOH) Interventions    Readmission Risk Interventions No flowsheet data found.

## 2018-11-25 NOTE — H&P (View-Only) (Signed)
PROGRESS NOTE    Curtis [name]  ZJQ:[id number] DOB: October 01, 1950 DOA: 11/23/2018 PCP: Patient, No Pcp Per   Brief Narrative:  Curtis [name] 68 y.o.malewithnosignificant past medical history presenting to the hospitalvia EMS for evaluation of sudden onset shortness of breath which started at 10 PM yesterday. On EMS arrival, patient was noted to be pale and diaphoretic. Noted to be in significant distress, diaphoretic, tachycardic, and tachypneic. Oxygen saturation 60% on room air, improved to 84% with BVM. EMS reported multiple syncopal episodes in route but patient never lost pulses and stayed in sinus tachycardia. He was given epinephrine 0.3 mg, IV magnesium 2 g, and IV Solu-Medrol 125 mg in route. Patient states last night around 10 PM he woke up from his sleep feeling very short of breath. He was feeling fine during the day and was not having any difficulty breathing. Denies chest pain, cough, fevers, or chills. Denies history of hypertension or congestive heart failure. States the last time he saw Mykelti Goldenstein doctor was 7 years ago. He is currently not on any medications. No other complaints.  ED Course:Awake and alert on arrival to the ED. Tachycardic and tachypneic. Placed on BiPAP. ABG with pH 7.21, PCO2 63, and PO2 408.Systolic >240.Afebrile. White count 21.1 with left shift. BNP 507. High-sensitivity troponin 63. Heart rate persistently in the 150s in the ED and initial EKG showing narrow complex tachycardia. BUN 16, creatinine 1.4. No prior labs for comparison. T bili 2.2, AST 45. Alk phos and ALT normal. SARS-CoV-2 test negative. Chest x-ray showing mild cardiomegaly and moderate pulmonary edema. Patient received adenosine 6 mg, nitroglycerin 2% ointment, and Lasix 40 mg in the ED. Repeat ABG showing improvement with pH 7.35 and PCO2 44.Repeat EKG with sinus tachycardia, LBBB, and T wave inversions in inferior leads. No prior EKG for comparison.   Assessment & Plan:     Principal Problem:   NSTEMI (non-ST elevated myocardial infarction) ([place]) Active Problems:   Acute respiratory failure with hypoxia and hypercapnia (HCC)   Pulmonary edema   Hypertensive emergency   Elevated troponin   Leukocytosis   Hypertension  Acute hypoxic hypercapnic respiratory failure   Acute Systolic Heart Failure: new onset CHF vs flash pulm edema from HTN emergency.  No hx HTN but hasn't seen doctor in 7 yrs. Oxygen saturation 60% on room air and required BVM ventilation. Initial ABG with pH 7.21 and PCO2 63. Placed on BiPAP and subsequent ABG showing improvement of acidosis and hypercapnia. Systolic blood pressure above 200 on arrival to the ED. BNP 507. Chest x-ray showing mild cardiomegaly and moderate pulmonary edema.Work of breathing and blood pressure improved significantly after BiPAP,IV Lasix, and Nitropaste. Currently requiring 2 L supplemental oxygen. -Monitor closely in the progressive care unit - Continue IV Lasix 40 mg twice daily - I/O, daily weights -Echocardiogram - with EF 20-25%, LV severely decreased function, global L ventricular hypokinesis with inferior wall akinesis -low-sodium diet with fluid restriction -Continue supplemental oxygen, wean as tolerated -Systolic currently in 973Z. IV hydralazine PRN - Cardiology consulting  Elevated troponin/ NSTEMI: concern for ACS. Initial high-sensitivity troponin 63, repeat significantly elevated at 4477 and then rising to 9016. EKG with left bundle branch block and worsening QTC prolongation (QTc 526 - more recently improved to ~470s). -Aspirin 324 mg -Heparin infusion started -Continue to trend troponin -Check magnesium level -Cardiology c/s, appreciate recs - pt not sure he wants CABG - continue ASA, coreg 3.125 mg BID - titrate as able - LDL 171, HDL 31.  A1c  5.8.  Continue statin. - cath with severe multivessel disease (see report).  Recommended CT surgery c/s. - Appreciate CT surgery consult  HTN  urgency: BP's 180/ 105 to > 200's on presentation. Has not seen doctor in 7 years.  BP's down w/ IV ntg and lasix.  - follow serial BP's - improved today - cardiology following will hold off on initiating BP meds as pt is pending ischemia/ CHF work-up  Renal Insufficiency BUN 16, creatinine 1.4 on presentation. No prior labs for comparison.  - improved to 1.2 today -Repeat BMP in Stavroula Rohde.m. -Monitor urine output - check UA (RBC's - will need outpatient follow up)  Hyperglycemia Random blood glucose 278. -Check A1c 5.8 -continue to monitor - consider adding SSI if hyperglycemia  Leukocytosis Afebrile. White count 21.1 with left shift. Dyspnea was acute onset and chest x-ray with findings consistent with pulmonary edema. SARS-CoV-2 test negative. Not endorsing any infectious symptoms. -Improved, continue to follow WBC count -Check procalcitonin level (wnl) -UA pending (not c/w UTI)  Tachycardia Heart rate persistently in the 150s and initial EKG showing narrow complex tachycardia.Patient received adenosine 6 mg in the ED. Repeat EKG showing sinus tachycardia.  - Heart rate currently improved and in the 80s to 90s. -Cardiac monitoring -Check TSH (wnl), free T4 levels (elevated to 1.38) - would repeat outpatient  Elevated T bili T bili 2.2, no significant elevation of remainder of LFTs. - improving   DVT prophylaxis: heparin gtt Code Status: full  Family Communication: none at bedside Disposition Plan: pending cardiology and CT surgery sign off   Consultants:   TCTS  Cardiology  Procedures:  Echo IMPRESSIONS    1. Left ventricular ejection fraction, by visual estimation, is 20 to 25%. The left ventricle has severely decreased function. Mildly increased left ventricular size. There is mildly increased left ventricular hypertrophy.  2. There is global left ventricular hypokinesis with inferior wall akinesis.  3. Abnormal septal motion consistent with left  bundle branch block.  4. Indeterminate diastolic filling due to E-[place] Blizzard fusion pattern of LV diastolic filling.  5. Global right ventricle was not assessed.The right ventricular size is normal. No increase in right ventricular wall thickness.  6. Left atrial size was mildly dilated.  7. Right atrial size was normal.  8. Mild mitral annular calcification.  9. The mitral valve is grossly normal. Moderate to severe mitral valve regurgitation. No evidence of mitral stenosis. 10. Posterior leaflet tethering is noted. 11. The tricuspid valve is normal in structure. Tricuspid valve regurgitation is mild. 12. The aortic valve is normal in structure. Aortic valve regurgitation is mild to moderate by color flow Doppler. Structurally normal aortic valve, with no evidence of sclerosis or stenosis. 13. The pulmonic valve was grossly normal. Pulmonic valve regurgitation is not visualized by color flow Doppler. 14. Mildly elevated pulmonary artery systolic pressure.  Cath  Acute systolic heart failure with pulmonary edema, echo LVEF 25%, and normal LVEDP after diuresis.  Assume that the patient has stunned myocardium (No symptoms prior to presentation).  Severe multivessel coronary disease with 40% tubular left main.    50 followed by 60 to 70% proximal LAD tandem lesions.  Severe 80% diffuse disease in the large first diagonal.    The circumflex contains multifocal high-grade stenoses in the mid body and distal before the origin of the left PDA.  The large third obtuse marginal contains 99% stenosis.    The right coronary is codominant and is totally occluded with collaterals filling from left to right  and right to right.  RECOMMENDATIONS:   After stabilization, the patient should be considered for surgical revascularization if targets are adequate.  TCTS consultation is recommended but because of the late completion of the procedure the office was closed.  Please consult in Kimi Bordeau.m.  Antimicrobials:    Anti-infectives (From admission, onward)   None         Subjective: No complaints He states he's not interested in surgery at this time Discuss that surgeons going to come and chat with him He's aware and is willing to listen  Objective: Vitals:   11/25/18 0000 11/25/18 0450 11/25/18 0458 11/25/18 0753  BP: 120/67   (!) 130/91  Pulse: 85   86  Resp: 19   17  Temp:   98.4 F (36.9 C) 98.2 F (36.8 C)  TempSrc:    Oral  SpO2: 96%   94%  Weight:  87.6 kg    Height:        Intake/Output Summary (Last 24 hours) at 11/25/2018 1803 Last data filed at 11/25/2018 1729 Gross per 24 hour  Intake 203.39 ml  Output 1600 ml  Net -1396.61 ml   Filed Weights   11/24/18 0638 11/24/18 1920 11/25/18 0450  Weight: 94.1 kg 89.1 kg 87.6 kg    Examination:  General exam: Appears calm and comfortable  Respiratory system: Clear to auscultation. Respiratory effort normal. Cardiovascular system: S1 & S2 heard, RRR.  Gastrointestinal system: Abdomen is nondistended, soft and nontender. Central nervous system: Alert and oriented. No focal neurological deficits. Extremities: no LEE Skin: No rashes, lesions or ulcers Psychiatry: Judgement and insight appear normal. Mood & affect appropriate.     Data Reviewed: I have personally reviewed following labs and imaging studies  CBC: Recent Labs  Lab 11/23/18 2328  11/23/18 2351 11/24/18 0106 11/24/18 0337 11/25/18 0810 11/25/18 1318  WBC 21.1*  --   --   --  18.3* 16.2* 17.0*  NEUTROABS 12.9*  --   --   --   --   --   --   HGB 16.6   < > 17.0 15.3 16.2 15.1 16.6  HCT 49.0   < > 50.0 45.0 48.7 43.4 47.6  MCV 102.1*  --   --   --  99.0 97.7 97.5  PLT 395  --   --   --  285 271 298   < > = values in this interval not displayed.   Basic Metabolic Panel: Recent Labs  Lab 11/23/18 2328 11/23/18 2339 11/23/18 2351 11/24/18 0106 11/24/18 0327 11/24/18 0820 11/25/18 0834  NA 139 137 139 138 139  --  141  K 3.8 3.3* 3.7 3.3* 3.6   --  3.2*  CL 102  --  102  --  102  --  104  CO2 23  --   --   --  23  --  26  GLUCOSE 278*  --  271*  --  192*  --  134*  BUN 16  --  22  --  15  --  24*  CREATININE 1.42*  --  1.30*  --  1.30*  --  1.22  CALCIUM 8.5*  --   --   --  8.8*  --  8.6*  MG  --   --   --   --   --  2.0  --    GFR: Estimated Creatinine Clearance: 66.2 mL/min (by C-G formula based on SCr of 1.22 mg/dL). Liver Function Tests: Recent Labs  Lab 11/23/18 2328 11/24/18 0327 11/25/18 0834  AST 45* 59* 33  ALT 30 38 29  ALKPHOS 89 80 70  BILITOT 2.2* 1.4* 1.3*  PROT 7.0 7.0 6.7  ALBUMIN 3.8 3.9 3.5   No results for input(s): LIPASE, AMYLASE in the last 168 hours. No results for input(s): AMMONIA in the last 168 hours. Coagulation Profile: No results for input(s): INR, PROTIME in the last 168 hours. Cardiac Enzymes: No results for input(s): CKTOTAL, CKMB, CKMBINDEX, TROPONINI in the last 168 hours. BNP (last 3 results) No results for input(s): PROBNP in the last 8760 hours. HbA1C: Recent Labs    11/24/18 0337  HGBA1C 5.8*   CBG: Recent Labs  Lab 11/23/18 2326  GLUCAP 272*   Lipid Profile: Recent Labs    11/25/18 0834  CHOL 233*  HDL 31*  LDLCALC 171*  TRIG 155*  CHOLHDL 7.5   Thyroid Function Tests: Recent Labs    11/24/18 1336  TSH 0.350  FREET4 1.38*   Anemia Panel: No results for input(s): VITAMINB12, FOLATE, FERRITIN, TIBC, IRON, RETICCTPCT in the last 72 hours. Sepsis Labs: Recent Labs  Lab 11/24/18 0327  PROCALCITON <0.10    Recent Results (from the past 240 hour(s))  SARS Coronavirus 2 [facility] order, Performed in [facility] hospital lab) Nasopharyngeal Nasopharyngeal Swab     Status: None   Collection Time: 11/23/18 11:29 PM   Specimen: Nasopharyngeal Swab  Result Value Ref Range Status   SARS Coronavirus 2 NEGATIVE NEGATIVE Final    Comment: (NOTE) If result is NEGATIVE SARS-CoV-2 target nucleic acids are NOT DETECTED. The SARS-CoV-2 RNA is generally  detectable in upper and lower  respiratory specimens during the acute phase of infection. The lowest  concentration of SARS-CoV-2 viral copies this assay can detect is 250  copies / mL. Jerrik Housholder negative result does not preclude SARS-CoV-2 infection  and should not be used as the sole basis for treatment or other  patient management decisions.  Loyola Santino negative result may occur with  improper specimen collection / handling, submission of specimen other  than nasopharyngeal swab, presence of viral mutation(s) within the  areas targeted by this assay, and inadequate number of viral copies  (<250 copies / mL). Tennis Mckinnon negative result must be combined with clinical  observations, patient history, and epidemiological information. If result is POSITIVE SARS-CoV-2 target nucleic acids are DETECTED. The SARS-CoV-2 RNA is generally detectable in upper and lower  respiratory specimens dur ing the acute phase of infection.  Positive  results are indicative of active infection with SARS-CoV-2.  Clinical  correlation with patient history and other diagnostic information is  necessary to determine patient infection status.  Positive results do  not rule out bacterial infection or co-infection with other viruses. If result is PRESUMPTIVE POSTIVE SARS-CoV-2 nucleic acids MAY BE PRESENT.   Lacreshia Bondarenko presumptive positive result was obtained on the submitted specimen  and confirmed on repeat testing.  While 2019 novel coronavirus  (SARS-CoV-2) nucleic acids may be present in the submitted sample  additional confirmatory testing may be necessary for epidemiological  and / or clinical management purposes  to differentiate between  SARS-CoV-2 and other Sarbecovirus currently known to infect humans.  If clinically indicated additional testing with an alternate test  methodology [phone]) is advised. The SARS-CoV-2 RNA is generally  detectable in upper and lower respiratory sp ecimens during the acute  phase of infection. The  expected result is Negative. Fact Sheet for Patients:  StrictlyIdeas.no Fact Sheet for Healthcare Providers: BankingDealers.co.za This  test is not yet approved or cleared by the [place] and has been authorized for detection and/or diagnosis of SARS-CoV-2 by FDA under an Emergency Use Authorization (EUA).  This EUA will remain in effect (meaning this test can be used) for the duration of the COVID-19 declaration under Section 564(b)(1) of the Act, 21 U.S.C. section 360bbb-3(b)(1), unless the authorization is terminated or revoked sooner. Performed at [facility], [place] [address] [place], [place] [address]          Radiology Studies: Dg Chest Portable 1 View  Result Date: 11/24/2018 CLINICAL DATA:  Shortness of breath. EXAM: PORTABLE CHEST 1 VIEW COMPARISON:  None. FINDINGS: Mild cardiomegaly. Aortic atherosclerosis. Diffuse interstitial prominence with Kerley B-lines consistent with pulmonary edema. No confluent airspace disease. Left costophrenic angle not entirely included in the field of view. No large pleural effusion. No pneumothorax. No acute osseous abnormalities are seen. IMPRESSION: 1. Mild cardiomegaly.  Moderate pulmonary edema. 2.  Aortic Atherosclerosis (ICD10-I70.0). Electronically Signed   By: Keith Rake M.D.   On: 11/24/2018 00:01        Scheduled Meds:  aspirin  81 mg Oral Daily   atorvastatin  80 mg Oral q1800   carvedilol  3.125 mg Oral BID WC   furosemide  40 mg Intravenous BID   sodium chloride flush  3 mL Intravenous Q12H   Continuous Infusions:  sodium chloride     sodium chloride     heparin 1,450 Units/hr (11/25/18 1120)   nitroGLYCERIN       LOS: 1 day    Time spent: over 30 min    Fayrene Helper, MD [facility] Pager AMION  If 7PM-7AM, please contact night-coverage [email or web address] Password [facility] 11/25/2018, 6:03 PM

## 2018-11-25 NOTE — Progress Notes (Signed)
TCTS consulted for CABG evaluation. °

## 2018-11-25 NOTE — Progress Notes (Signed)
ANTICOAGULATION CONSULT NOTE - Follow Up Consult  Pharmacy Consult for Heparin Indication: 3VCAD  Allergies  Allergen Reactions  . Penicillins Rash    Did it involve swelling of the face/tongue/throat, SOB, or low BP? Unknown Did it involve sudden or severe rash/hives, skin peeling, or any reaction on the inside of your mouth or nose? Yes Did you need to seek medical attention at a hospital or doctor's office? Unknown When did it last happen? childhood If all above answers are "NO", may proceed with cephalosporin use.     Patient Measurements: Height: 5\' 9"  (175.3 cm) Weight: 193 lb 2 oz (87.6 kg) IBW/kg (Calculated) : 70.7 Heparin Dosing Weight: 88.6 kg  Vital Signs: Temp: 98.2 F (36.8 C) (09/23 0753) Temp Source: Oral (09/23 0753) BP: 130/91 (09/23 0753) Pulse Rate: 86 (09/23 0753)  Labs: Recent Labs    11/23/18 2328  11/23/18 2351  11/24/18 0327 11/24/18 0337 11/24/18 0820 11/24/18 1336 11/25/18 0810 11/25/18 0834 11/25/18 1318 11/25/18 1423  HGB 16.6   < > 17.0   < >  --  16.2  --   --  15.1  --  16.6  --   HCT 49.0   < > 50.0   < >  --  48.7  --   --  43.4  --  47.6  --   PLT 395  --   --   --   --  285  --   --  271  --  298  --   HEPARINUNFRC  --   --   --   --   --   --   --  0.16* 0.42  --   --  0.45  CREATININE 1.42*  --  1.30*  --  1.30*  --   --   --   --  1.22  --   --   TROPONINIHS 63*  --   --   --  4,477*  --  9,016*  --   --   --   --   --    < > = values in this interval not displayed.    Estimated Creatinine Clearance: 66.2 mL/min (by C-G formula based on SCr of 1.22 mg/dL).   Assessment:  Anticoag: ACS. Heparin resumed post-cath 9/22. HL 0.42 and 0.45 both in goal range x 2. Hgb 16.6. Plts 298 both WNL. - 9/22: Cath: EF 25%. Severe multivessel CAD.  TCTS consulted for CABG evaluation  Goal of Therapy:  Heparin level 0.3-0.7 units/ml Monitor platelets by anticoagulation protocol: Yes   Plan:  Continue Heparin infusion 1450  units/hr.  Daily Heparin level and CBC while on therapy.  TCTS consulted for CABG evaluation   Dareth Andrew S. Alford Highland, PharmD, BCPS Clinical Staff Pharmacist [facility] 11/25/2018,2:58 PM

## 2018-11-25 NOTE — Progress Notes (Signed)
Progress Note  Patient Name: Curtis [name] Date of Encounter: 11/25/2018  Primary Cardiologist:  Chrystie Nose, MD  Subjective   Pt says will refuse surgery because he thinks he will get dementia (mother and grandmother had it). Says will take meds if cheap  Inpatient Medications    Scheduled Meds:  atorvastatin  80 mg Oral q1800   furosemide  40 mg Intravenous BID   Continuous Infusions:  heparin 1,450 Units/hr (11/25/18 0400)   nitroGLYCERIN     PRN Meds: acetaminophen, hydrALAZINE, ondansetron (ZOFRAN) IV   Vital Signs    Vitals:   11/25/18 0000 11/25/18 0450 11/25/18 0458 11/25/18 0753  BP: 120/67   (!) 130/91  Pulse: 85   86  Resp: 19   17  Temp:   98.4 F (36.9 C) 98.2 F (36.8 C)  TempSrc:    Oral  SpO2: 96%   94%  Weight:  87.6 kg    Height:        Intake/Output Summary (Last 24 hours) at 11/25/2018 0721 Last data filed at 11/25/2018 0400 Gross per 24 hour  Intake 43.39 ml  Output 1500 ml  Net -1456.61 ml   Filed Weights   11/24/18 0638 11/24/18 1920 11/25/18 0450  Weight: 94.1 kg 89.1 kg 87.6 kg   Last Weight  Most recent update: 11/25/2018  4:50 AM   Weight  87.6 kg (193 lb 2 oz)           Weight change: -5 kg   Telemetry    SR, LBBB - Personally Reviewed  ECG    None today - Personally Reviewed  Physical Exam   General: Well developed, well nourished, male appearing in no acute distress. Head: Normocephalic, atraumatic.  Neck: Supple without bruits, JVD minimal elevation. Lungs:  Resp regular and unlabored, few scattered rales w/ decreased BS bases. Heart: RRR, S1, S2, no S3, S4, or murmur; no rub. Abdomen: Soft, non-tender, non-distended with normoactive bowel sounds. No hepatomegaly. No rebound/guarding. No obvious abdominal masses. Extremities: No clubbing, cyanosis, no edema. Distal pedal pulses are 2+ bilaterally. Neuro: Alert and oriented X 3. Moves all extremities spontaneously. Psych: Normal affect.  Labs      Hematology Recent Labs  Lab 11/23/18 2328  11/24/18 0106 11/24/18 0337 11/25/18 0810  WBC 21.1*  --   --  18.3* 16.2*  RBC 4.80  --   --  4.92 4.44  HGB 16.6   < > 15.3 16.2 15.1  HCT 49.0   < > 45.0 48.7 43.4  MCV 102.1*  --   --  99.0 97.7  MCH 34.6*  --   --  32.9 34.0  MCHC 33.9  --   --  33.3 34.8  RDW 12.0  --   --  12.1 12.1  PLT 395  --   --  285 271   < > = values in this interval not displayed.    Chemistry Recent Labs  Lab 11/23/18 2328  11/23/18 2351 11/24/18 0106 11/24/18 0327  NA 139   < > 139 138 139  K 3.8   < > 3.7 3.3* 3.6  CL 102  --  102  --  102  CO2 23  --   --   --  23  GLUCOSE 278*  --  271*  --  192*  BUN 16  --  22  --  15  CREATININE 1.42*  --  1.30*  --  1.30*  CALCIUM 8.5*  --   --   --  8.8*  PROT 7.0  --   --   --  7.0  ALBUMIN 3.8  --   --   --  3.9  AST 45*  --   --   --  59*  ALT 30  --   --   --  38  ALKPHOS 89  --   --   --  80  BILITOT 2.2*  --   --   --  1.4*  GFRNONAA 51*  --   --   --  57*  GFRAA 60*  --   --   --  >60  ANIONGAP 14  --   --   --  14   < > = values in this interval not displayed.     High Sensitivity Troponin:   Recent Labs  Lab 11/23/18 2328 11/24/18 0327 11/24/18 0820  TROPONINIHS 63* 4,477* 9,016*      BNP Recent Labs  Lab 11/23/18 2328  BNP 507.9*   Lab Results  Component Value Date   CHOL 233 (H) 11/25/2018   HDL 31 (L) 11/25/2018   LDLCALC 171 (H) 11/25/2018   TRIG 155 (H) 11/25/2018   CHOLHDL 7.5 11/25/2018    Radiology    Dg Chest Portable 1 View  Result Date: 11/24/2018 CLINICAL DATA:  Shortness of breath. EXAM: PORTABLE CHEST 1 VIEW COMPARISON:  None. FINDINGS: Mild cardiomegaly. Aortic atherosclerosis. Diffuse interstitial prominence with Kerley B-lines consistent with pulmonary edema. No confluent airspace disease. Left costophrenic angle not entirely included in the field of view. No large pleural effusion. No pneumothorax. No acute osseous abnormalities are seen.  IMPRESSION: 1. Mild cardiomegaly.  Moderate pulmonary edema. 2.  Aortic Atherosclerosis (ICD10-I70.0). Electronically Signed   By: Narda Rutherford M.D.   On: 11/24/2018 00:01   Cardiac Studies   ECHO:  11/24/2018  1. Left ventricular ejection fraction, by visual estimation, is 20 to 25%. The left ventricle has severely decreased function. Mildly increased left ventricular size. There is mildly increased left ventricular hypertrophy.  2. There is global left ventricular hypokinesis with inferior wall akinesis.  3. Abnormal septal motion consistent with left bundle branch block.  4. Indeterminate diastolic filling due to E-A fusion pattern of LV diastolic filling.  5. Global right ventricle was not assessed.The right ventricular size is normal. No increase in right ventricular wall thickness.  6. Left atrial size was mildly dilated.  7. Right atrial size was normal.  8. Mild mitral annular calcification.  9. The mitral valve is grossly normal. Moderate to severe mitral valve regurgitation. No evidence of mitral stenosis. 10. Posterior leaflet tethering is noted. 11. The tricuspid valve is normal in structure. Tricuspid valve regurgitation is mild. 12. The aortic valve is normal in structure. Aortic valve regurgitation is mild to moderate by color flow Doppler. Structurally normal aortic valve, with no evidence of sclerosis or stenosis. 13. The pulmonic valve was grossly normal. Pulmonic valve regurgitation is not visualized by color flow Doppler. 14. Mildly elevated pulmonary artery systolic pressure.  CATH: 11/24/2018  Acute systolic heart failure with pulmonary edema, echo LVEF 25%, and normal LVEDP after diuresis.  Assume that the patient has stunned myocardium (No symptoms prior to presentation).  Severe multivessel coronary disease with 40% tubular left main.    50 followed by 60 to 70% proximal LAD tandem lesions.  Severe 80% diffuse disease in the large first diagonal.    The  circumflex contains multifocal high-grade stenoses in the mid body and distal before the origin  of the left PDA.  The large third obtuse marginal contains 99% stenosis.    The right coronary is codominant and is totally occluded with collaterals filling from left to right and right to right.  RECOMMENDATIONS:   After stabilization, the patient should be considered for surgical revascularization if targets are adequate.  TCTS consultation is recommended but because of the late completion of the procedure the office was closed.  Please consult in a.m.  Patient Profile     68 y.o. male w/ hx Tob use was admitted 09/22 with NSTEMI and respiratory failure, pulm edema  Assessment & Plan    1. NSTEMI - See cath report above>>TCTS to see. Asked pt to talk w/ them, then can refuse surgery if he wishes - peak Trop 9016  - EF 20-25% - on high-dose statin and Lasix - add ASA, Coreg 3.125 mg bid>>titrate as BP/HR will tolerate  2. Acute systolic CHF: - On Lasix 40 mg IV BID - need BMET qd - wt down 3 lbs, I/O net -2.2 L - continue diuresis, but may be able to change to po Lasix tomorrow  3. Hyperlipidemia, goal LDL < 70 - high-dose statin is new  Otherwise, per IM Principal Problem:   NSTEMI (non-ST elevated myocardial infarction) ([place]) Active Problems:   Acute respiratory failure with hypoxia and hypercapnia ([place])   Pulmonary edema   Hypertensive emergency   Elevated troponin   Leukocytosis   Hypertension    Jonetta Speak , PA-C 9:37 AM 11/25/2018 Pager: [phone]

## 2018-11-25 NOTE — Progress Notes (Signed)
PROGRESS NOTE    Curtis [name]  ZJQ:[id number] DOB: October 01, 1950 DOA: 11/23/2018 PCP: Patient, No Pcp Per   Brief Narrative:  Curtis [name] 68 y.o.malewithnosignificant past medical history presenting to the hospitalvia EMS for evaluation of sudden onset shortness of breath which started at 10 PM yesterday. On EMS arrival, patient was noted to be pale and diaphoretic. Noted to be in significant distress, diaphoretic, tachycardic, and tachypneic. Oxygen saturation 60% on room air, improved to 84% with BVM. EMS reported multiple syncopal episodes in route but patient never lost pulses and stayed in sinus tachycardia. He was given epinephrine 0.3 mg, IV magnesium 2 g, and IV Solu-Medrol 125 mg in route. Patient states last night around 10 PM he woke up from his sleep feeling very short of breath. He was feeling fine during the day and was not having any difficulty breathing. Denies chest pain, cough, fevers, or chills. Denies history of hypertension or congestive heart failure. States the last time he saw [facility] doctor was 7 years ago. He is currently not on any medications. No other complaints.  ED Course:Awake and alert on arrival to the ED. Tachycardic and tachypneic. Placed on BiPAP. ABG with pH 7.21, PCO2 63, and PO2 408.Systolic >240.Afebrile. White count 21.1 with left shift. BNP 507. High-sensitivity troponin 63. Heart rate persistently in the 150s in the ED and initial EKG showing narrow complex tachycardia. BUN 16, creatinine 1.4. No prior labs for comparison. T bili 2.2, AST 45. Alk phos and ALT normal. SARS-CoV-2 test negative. Chest x-ray showing mild cardiomegaly and moderate pulmonary edema. Patient received adenosine 6 mg, nitroglycerin 2% ointment, and Lasix 40 mg in the ED. Repeat ABG showing improvement with pH 7.35 and PCO2 44.Repeat EKG with sinus tachycardia, LBBB, and T wave inversions in inferior leads. No prior EKG for comparison.   Assessment & Plan:     Principal Problem:   NSTEMI (non-ST elevated myocardial infarction) ([place]) Active Problems:   Acute respiratory failure with hypoxia and hypercapnia ([facility])   Pulmonary edema   Hypertensive emergency   Elevated troponin   Leukocytosis   Hypertension  Acute hypoxic hypercapnic respiratory failure   Acute Systolic Heart Failure: new onset CHF vs flash pulm edema from HTN emergency.  No hx HTN but hasn't seen doctor in 7 yrs. Oxygen saturation 60% on room air and required BVM ventilation. Initial ABG with pH 7.21 and PCO2 63. Placed on BiPAP and subsequent ABG showing improvement of acidosis and hypercapnia. Systolic blood pressure above 200 on arrival to the ED. BNP 507. Chest x-ray showing mild cardiomegaly and moderate pulmonary edema.Work of breathing and blood pressure improved significantly after BiPAP,IV Lasix, and Nitropaste. Currently requiring 2 L supplemental oxygen. -Monitor closely in the progressive care unit - Continue IV Lasix 40 mg twice daily - I/O, daily weights -Echocardiogram - with EF 20-25%, LV severely decreased function, global L ventricular hypokinesis with inferior wall akinesis -low-sodium diet with fluid restriction -Continue supplemental oxygen, wean as tolerated -Systolic currently in 973Z. IV hydralazine PRN - Cardiology consulting  Elevated troponin/ NSTEMI: concern for ACS. Initial high-sensitivity troponin 63, repeat significantly elevated at 4477 and then rising to 9016. EKG with left bundle branch block and worsening QTC prolongation (QTc 526 - more recently improved to ~470s). -Aspirin 324 mg -Heparin infusion started -Continue to trend troponin -Check magnesium level -Cardiology c/s, appreciate recs - pt not sure he wants CABG - continue ASA, coreg 3.125 mg BID - titrate as able - LDL 171, HDL 31.  A1c  5.8.  Continue statin. - cath with severe multivessel disease (see report).  Recommended CT surgery c/s. - Appreciate CT surgery consult  HTN  urgency: BP's 180/ 105 to > 200's on presentation. Has not seen doctor in 7 years.  BP's down w/ IV ntg and lasix.  - follow serial BP's - improved today - cardiology following will hold off on initiating BP meds as pt is pending ischemia/ CHF work-up  Renal Insufficiency BUN 16, creatinine 1.4 on presentation. No prior labs for comparison.  - improved to 1.2 today -Repeat BMP in Haislee Corso.m. -Monitor urine output - check UA (RBC's - will need outpatient follow up)  Hyperglycemia Random blood glucose 278. -Check A1c 5.8 -continue to monitor - consider adding SSI if hyperglycemia  Leukocytosis Afebrile. White count 21.1 with left shift. Dyspnea was acute onset and chest x-ray with findings consistent with pulmonary edema. SARS-CoV-2 test negative. Not endorsing any infectious symptoms. -Improved, continue to follow WBC count -Check procalcitonin level (wnl) -UA pending (not c/w UTI)  Tachycardia Heart rate persistently in the 150s and initial EKG showing narrow complex tachycardia.Patient received adenosine 6 mg in the ED. Repeat EKG showing sinus tachycardia.  - Heart rate currently improved and in the 80s to 90s. -Cardiac monitoring -Check TSH (wnl), free T4 levels (elevated to 1.38) - would repeat outpatient  Elevated T bili T bili 2.2, no significant elevation of remainder of LFTs. - improving   DVT prophylaxis: heparin gtt Code Status: full  Family Communication: none at bedside Disposition Plan: pending cardiology and CT surgery sign off   Consultants:   TCTS  Cardiology  Procedures:  Echo IMPRESSIONS    1. Left ventricular ejection fraction, by visual estimation, is 20 to 25%. The left ventricle has severely decreased function. Mildly increased left ventricular size. There is mildly increased left ventricular hypertrophy.  2. There is global left ventricular hypokinesis with inferior wall akinesis.  3. Abnormal septal motion consistent with left  bundle branch block.  4. Indeterminate diastolic filling due to E-Vernia Teem fusion pattern of LV diastolic filling.  5. Global right ventricle was not assessed.The right ventricular size is normal. No increase in right ventricular wall thickness.  6. Left atrial size was mildly dilated.  7. Right atrial size was normal.  8. Mild mitral annular calcification.  9. The mitral valve is grossly normal. Moderate to severe mitral valve regurgitation. No evidence of mitral stenosis. 10. Posterior leaflet tethering is noted. 11. The tricuspid valve is normal in structure. Tricuspid valve regurgitation is mild. 12. The aortic valve is normal in structure. Aortic valve regurgitation is mild to moderate by color flow Doppler. Structurally normal aortic valve, with no evidence of sclerosis or stenosis. 13. The pulmonic valve was grossly normal. Pulmonic valve regurgitation is not visualized by color flow Doppler. 14. Mildly elevated pulmonary artery systolic pressure.  Cath  Acute systolic heart failure with pulmonary edema, echo LVEF 25%, and normal LVEDP after diuresis.  Assume that the patient has stunned myocardium (No symptoms prior to presentation).  Severe multivessel coronary disease with 40% tubular left main.    50 followed by 60 to 70% proximal LAD tandem lesions.  Severe 80% diffuse disease in the large first diagonal.    The circumflex contains multifocal high-grade stenoses in the mid body and distal before the origin of the left PDA.  The large third obtuse marginal contains 99% stenosis.    The right coronary is codominant and is totally occluded with collaterals filling from left to right  and right to right.  RECOMMENDATIONS:   After stabilization, the patient should be considered for surgical revascularization if targets are adequate.  TCTS consultation is recommended but because of the late completion of the procedure the office was closed.  Please consult in Azhane Eckart.m.  Antimicrobials:    Anti-infectives (From admission, onward)   None         Subjective: No complaints He states he's not interested in surgery at this time Discuss that surgeons going to come and chat with him He's aware and is willing to listen  Objective: Vitals:   11/25/18 0000 11/25/18 0450 11/25/18 0458 11/25/18 0753  BP: 120/67   (!) 130/91  Pulse: 85   86  Resp: 19   17  Temp:   98.4 F (36.9 C) 98.2 F (36.8 C)  TempSrc:    Oral  SpO2: 96%   94%  Weight:  87.6 kg    Height:        Intake/Output Summary (Last 24 hours) at 11/25/2018 1803 Last data filed at 11/25/2018 1729 Gross per 24 hour  Intake 203.39 ml  Output 1600 ml  Net -1396.61 ml   Filed Weights   11/24/18 0638 11/24/18 1920 11/25/18 0450  Weight: 94.1 kg 89.1 kg 87.6 kg    Examination:  General exam: Appears calm and comfortable  Respiratory system: Clear to auscultation. Respiratory effort normal. Cardiovascular system: S1 & S2 heard, RRR.  Gastrointestinal system: Abdomen is nondistended, soft and nontender. Central nervous system: Alert and oriented. No focal neurological deficits. Extremities: no LEE Skin: No rashes, lesions or ulcers Psychiatry: Judgement and insight appear normal. Mood & affect appropriate.     Data Reviewed: I have personally reviewed following labs and imaging studies  CBC: Recent Labs  Lab 11/23/18 2328  11/23/18 2351 11/24/18 0106 11/24/18 0337 11/25/18 0810 11/25/18 1318  WBC 21.1*  --   --   --  18.3* 16.2* 17.0*  NEUTROABS 12.9*  --   --   --   --   --   --   HGB 16.6   < > 17.0 15.3 16.2 15.1 16.6  HCT 49.0   < > 50.0 45.0 48.7 43.4 47.6  MCV 102.1*  --   --   --  99.0 97.7 97.5  PLT 395  --   --   --  285 271 298   < > = values in this interval not displayed.   Basic Metabolic Panel: Recent Labs  Lab 11/23/18 2328 11/23/18 2339 11/23/18 2351 11/24/18 0106 11/24/18 0327 11/24/18 0820 11/25/18 0834  NA 139 137 139 138 139  --  141  K 3.8 3.3* 3.7 3.3* 3.6   --  3.2*  CL 102  --  102  --  102  --  104  CO2 23  --   --   --  23  --  26  GLUCOSE 278*  --  271*  --  192*  --  134*  BUN 16  --  22  --  15  --  24*  CREATININE 1.42*  --  1.30*  --  1.30*  --  1.22  CALCIUM 8.5*  --   --   --  8.8*  --  8.6*  MG  --   --   --   --   --  2.0  --    GFR: Estimated Creatinine Clearance: 66.2 mL/min (by C-G formula based on SCr of 1.22 mg/dL). Liver Function Tests: Recent Labs  Lab 11/23/18 2328 11/24/18 0327 11/25/18 0834  AST 45* 59* 33  ALT 30 38 29  ALKPHOS 89 80 70  BILITOT 2.2* 1.4* 1.3*  PROT 7.0 7.0 6.7  ALBUMIN 3.8 3.9 3.5   No results for input(s): LIPASE, AMYLASE in the last 168 hours. No results for input(s): AMMONIA in the last 168 hours. Coagulation Profile: No results for input(s): INR, PROTIME in the last 168 hours. Cardiac Enzymes: No results for input(s): CKTOTAL, CKMB, CKMBINDEX, TROPONINI in the last 168 hours. BNP (last 3 results) No results for input(s): PROBNP in the last 8760 hours. HbA1C: Recent Labs    11/24/18 0337  HGBA1C 5.8*   CBG: Recent Labs  Lab 11/23/18 2326  GLUCAP 272*   Lipid Profile: Recent Labs    11/25/18 0834  CHOL 233*  HDL 31*  LDLCALC 171*  TRIG 155*  CHOLHDL 7.5   Thyroid Function Tests: Recent Labs    11/24/18 1336  TSH 0.350  FREET4 1.38*   Anemia Panel: No results for input(s): VITAMINB12, FOLATE, FERRITIN, TIBC, IRON, RETICCTPCT in the last 72 hours. Sepsis Labs: Recent Labs  Lab 11/24/18 0327  PROCALCITON <0.10    Recent Results (from the past 240 hour(s))  SARS Coronavirus 2 [facility] order, Performed in [facility] hospital lab) Nasopharyngeal Nasopharyngeal Swab     Status: None   Collection Time: 11/23/18 11:29 PM   Specimen: Nasopharyngeal Swab  Result Value Ref Range Status   SARS Coronavirus 2 NEGATIVE NEGATIVE Final    Comment: (NOTE) If result is NEGATIVE SARS-CoV-2 target nucleic acids are NOT DETECTED. The SARS-CoV-2 RNA is generally  detectable in upper and lower  respiratory specimens during the acute phase of infection. The lowest  concentration of SARS-CoV-2 viral copies this assay can detect is 250  copies / mL. Malajah Oceguera negative result does not preclude SARS-CoV-2 infection  and should not be used as the sole basis for treatment or other  patient management decisions.  Latronda Spink negative result may occur with  improper specimen collection / handling, submission of specimen other  than nasopharyngeal swab, presence of viral mutation(s) within the  areas targeted by this assay, and inadequate number of viral copies  (<250 copies / mL). Clotilde Loth negative result must be combined with clinical  observations, patient history, and epidemiological information. If result is POSITIVE SARS-CoV-2 target nucleic acids are DETECTED. The SARS-CoV-2 RNA is generally detectable in upper and lower  respiratory specimens dur ing the acute phase of infection.  Positive  results are indicative of active infection with SARS-CoV-2.  Clinical  correlation with patient history and other diagnostic information is  necessary to determine patient infection status.  Positive results do  not rule out bacterial infection or co-infection with other viruses. If result is PRESUMPTIVE POSTIVE SARS-CoV-2 nucleic acids MAY BE PRESENT.   Tiyanna Larcom presumptive positive result was obtained on the submitted specimen  and confirmed on repeat testing.  While 2019 novel coronavirus  (SARS-CoV-2) nucleic acids may be present in the submitted sample  additional confirmatory testing may be necessary for epidemiological  and / or clinical management purposes  to differentiate between  SARS-CoV-2 and other Sarbecovirus currently known to infect humans.  If clinically indicated additional testing with an alternate test  methodology [phone]) is advised. The SARS-CoV-2 RNA is generally  detectable in upper and lower respiratory sp ecimens during the acute  phase of infection. The  expected result is Negative. Fact Sheet for Patients:  StrictlyIdeas.no Fact Sheet for Healthcare Providers: BankingDealers.co.za This  test is not yet approved or cleared by the [place] and has been authorized for detection and/or diagnosis of SARS-CoV-2 by FDA under an Emergency Use Authorization (EUA).  This EUA will remain in effect (meaning this test can be used) for the duration of the COVID-19 declaration under Section 564(b)(1) of the Act, 21 U.S.C. section 360bbb-3(b)(1), unless the authorization is terminated or revoked sooner. Performed at [facility], [place] [address] [place], [place] [address]          Radiology Studies: Dg Chest Portable 1 View  Result Date: 11/24/2018 CLINICAL DATA:  Shortness of breath. EXAM: PORTABLE CHEST 1 VIEW COMPARISON:  None. FINDINGS: Mild cardiomegaly. Aortic atherosclerosis. Diffuse interstitial prominence with Kerley B-lines consistent with pulmonary edema. No confluent airspace disease. Left costophrenic angle not entirely included in the field of view. No large pleural effusion. No pneumothorax. No acute osseous abnormalities are seen. IMPRESSION: 1. Mild cardiomegaly.  Moderate pulmonary edema. 2.  Aortic Atherosclerosis (ICD10-I70.0). Electronically Signed   By: Keith Rake M.D.   On: 11/24/2018 00:01        Scheduled Meds:  aspirin  81 mg Oral Daily   atorvastatin  80 mg Oral q1800   carvedilol  3.125 mg Oral BID WC   furosemide  40 mg Intravenous BID   sodium chloride flush  3 mL Intravenous Q12H   Continuous Infusions:  sodium chloride     sodium chloride     heparin 1,450 Units/hr (11/25/18 1120)   nitroGLYCERIN       LOS: 1 day    Time spent: over 30 min    Fayrene Helper, MD [facility] Pager AMION  If 7PM-7AM, please contact night-coverage [email or web address] Password [facility] 11/25/2018, 6:03 PM

## 2018-11-25 NOTE — Subjective & Objective (Signed)
[address]       [place] [address]             [phone]        Davonta Stroot [facility] Medical Record #[id number] Date of Birth: [birth year]  Referring: Dr. Verdis Prime Primary Care: Patient, [name]:Kenneth Alona Bene, MD  Chief Complaint:    Chief Complaint  Patient presents with   Respiratory Distress    History of Present Illness:     [name] is a 68 year old male with no known chronic illnesses and who has not seen a physician in the past 7 years but who is an active smoker and has a family history of premature coronary artery disease was brought to the emergency room via EMS after experiencing acute onset shortness of breath about 8 hours prior to his arrival.  He states he started developing shortness of breath at about 10 PM on the day prior to admission.  He denied having any chest pain associated with shortness of breath.  On arrival, he was diaphoretic, tachypneic, and tachycardic.  He was found to be in a narrow complex tachycardia with a rate of about 140.  He was given a adenosine and his heart rate slowed to about 105 but he had a left bundle branch block.  Further evaluation in the emergency room demonstrated an initial troponin of 63 that rose to 4400 later in the day.  Chest x-ray demonstrated acute pulmonary edema.  He was treated with topical nitroglycerin and IV Lasix.  Having ruled in for acute myocardial infarction, he was admitted to the hospital for further evaluation.  He improved symptomatically.  He was taken to the Cath Lab where left heart catheterization demonstrated occlusion of the right coronary artery.  There was a 30 to 40% stenosis of the left main coronary artery.  The left anterior descending coronary had had a 50% proximal lesion followed by a second 70% proximal lesion.  The first diagonal was severely diseased proximally.  The circumflex was codominant with an 85% mid to distal stenosis.  Ejection fraction  was estimated 25 to 30%.  There were no transcatheter interventions undertaken.  Patient remained stable following left heart catheterization.  He went on to have an echocardiogram that showed an estimated ejection fraction of 20 to 25%.  There was a moderate to severe mitral insufficiency and mild to moderate aortic insufficiency.  There were significant regional wall motion abnormalities involving the septum, inferior wall, lateral wall, and apex of the left ventricle.  [name] has continued to improve symptomatically with diuresis and medical therapy.  We have been asked to evaluate him for consideration of coronary revascularization and to address his valvular disease.   Current Activity/ Functional Status: {functional status:19517}   Zubrod Score: At the time of surgery this patients most appropriate activity status/level should be described as: []     0    Normal activity, no symptoms []     1    Restricted in physical strenuous activity but ambulatory, able to do out light work []     2    Ambulatory and capable of self care, unable to do work activities, up and about                 more than 50%  Of the time                            []   3    Only limited self care, in bed greater than 50% of waking hours []     4    Completely disabled, no self care, confined to bed or chair []     5    Moribund  Past Medical History:  Diagnosis Date   Tobacco use     Past Surgical History:  Procedure Laterality Date   LEFT HEART CATH AND CORONARY ANGIOGRAPHY N/A 11/24/2018   Procedure: LEFT HEART CATH AND CORONARY ANGIOGRAPHY;  Surgeon: , MD;  Location: [facility] INVASIVE CV LAB;  Service: Cardiovascular;  Laterality: N/A;    Social History   Tobacco Use  Smoking Status Current Some Day Smoker   Packs/day: 0.25   Types: Cigarettes  Smokeless Tobacco Never Used    Social History   Substance and Sexual Activity  Alcohol Use Never   Frequency: Never     Allergies    Allergen Reactions   Penicillins Rash    Did it involve swelling of the face/tongue/throat, SOB, or low BP? Unknown Did it involve sudden or severe rash/hives, skin peeling, or any reaction on the inside of your mouth or nose? Yes Did you need to seek medical attention at a hospital or doctor's office? Unknown When did it last happen? childhood If all above answers are NO, may proceed with cephalosporin use.     Current Facility-Administered Medications  Medication Dose Route Frequency Provider Last Rate Last Dose   acetaminophen (TYLENOL) tablet 650 mg  650 mg Oral Q4H PRN 11/26/2018, MD       aspirin chewable tablet 81 mg  81 mg Oral Daily Barrett, Rhonda G, PA-C       atorvastatin (LIPITOR) tablet 80 mg  80 mg Oral q1800 05-02-1979, MD       carvedilol (COREG) tablet 3.125 mg  3.125 mg Oral BID WC Barrett, Rhonda G, PA-C       furosemide (LASIX) injection 40 mg  40 mg Intravenous BID 01-23-1989, MD   40 mg at 11/25/18 0933   heparin ADULT infusion 100 units/mL (25000 units/245mL sodium chloride 0.45%)  1,450 Units/hr Intravenous Continuous 11/27/18, RPH 14.5 mL/hr at 11/25/18 0400 1,450 Units/hr at 11/25/18 0400   hydrALAZINE (APRESOLINE) injection 5 mg  5 mg Intravenous Q4H PRN 11/27/18, MD       nitroGLYCERIN 50 mg in dextrose 5 % 250 mL (0.2 mg/mL) infusion  20 mcg/min Intravenous Titrated 11/27/18, MD       ondansetron [facility]) injection 4 mg  4 mg Intravenous Q6H PRN Lyn Records, MD        No medications prior to admission.    Family History  Problem Relation Age of Onset   Heart attack Father    Heart attack Mother      Review of Systems:   ROS {Ros - complete:30496}     Cardiac Review of Systems: Y or  [    ]= no  Chest Pain [    ]  Resting SOB [   ] Exertional SOB  [  ]  Orthopnea [  ]   Pedal Edema [   ]    Palpitations [  ] Syncope  [  ]   Presyncope [   ]  General Review of Systems: [Y] = yes [   ]=no Constitional: recent weight change [  ]; anorexia [  ]; fatigue [  ]; nausea [  ]; night sweats [  ];  fever [  ]; or chills [  ]                                                               Dental: Last Dentist visit:   Eye : blurred vision [  ]; diplopia [   ]; vision changes [  ];  Amaurosis fugax[  ]; Resp: cough [  ];  wheezing[  ];  hemoptysis[  ]; shortness of breath[  ]; paroxysmal nocturnal dyspnea[  ]; dyspnea on exertion[  ]; or orthopnea[  ];  GI:  gallstones[  ], vomiting[  ];  dysphagia[  ]; melena[  ];  hematochezia [  ]; heartburn[  ];   Hx of  Colonoscopy[  ]; GU: kidney stones [  ]; hematuria[  ];   dysuria [  ];  nocturia[  ];  history of     obstruction [  ]; urinary frequency [  ]             Skin: rash, swelling[  ];, hair loss[  ];  peripheral edema[  ];  or itching[  ]; Musculosketetal: myalgias[  ];  joint swelling[  ];  joint erythema[  ];  joint pain[  ];  back pain[  ];  Heme/Lymph: bruising[  ];  bleeding[  ];  anemia[  ];  Neuro: TIA[  ];  headaches[  ];  stroke[  ];  vertigo[  ];  seizures[  ];   paresthesias[  ];  difficulty walking[  ];  Psych:depression[  ]; anxiety[  ];  Endocrine: diabetes[  ];  thyroid dysfunction[  ];            {Dental Care:21289::"Single injection performed as below:"}            {Flue/Pneumonia Vaccination Status:21291}      Physical Exam: BP (!) 130/91 (BP Location: Left Leg)    Pulse 86    Temp 98.2 F (36.8 C) (Oral)    Resp 17    Ht 5\' 9"  (1.753 m)    Wt 87.6 kg    SpO2 94%    BMI 28.52 kg/m    {physical exam:21449}  Diagnostic Studies & Laboratory data:  Procedures  LEFT HEART CATH AND CORONARY ANGIOGRAPHY  Conclusion   Acute systolic heart failure with pulmonary edema, echo LVEF 25%, and normal LVEDP after diuresis.  Assume that the patient has stunned myocardium (No symptoms prior to presentation).  Severe multivessel coronary disease with 40% tubular left main.    50 followed by 60 to 70% proximal LAD  tandem lesions.  Severe 80% diffuse disease in the large first diagonal.    The circumflex contains multifocal high-grade stenoses in the mid body and distal before the origin of the left PDA.  The large third obtuse marginal contains 99% stenosis.    The right coronary is codominant and is totally occluded with collaterals filling from left to right and right to right.  RECOMMENDATIONS:   After stabilization, the patient should be considered for surgical revascularization if targets are adequate.  TCTS consultation is recommended but because of the late completion of the procedure the office was closed.  Please consult in a.m.  Recommendations  Antiplatelet/Anticoag Recommend Aspirin 81mg  daily for moderate CAD.  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  The right radial area was sterilely prepped and draped. Intravenous sedation with Versed and fentanyl was administered. 1% Xylocaine was infiltrated to achieve local analgesia. Using real-time vascular ultrasound, a double wall stick with an angiocath was utilized to obtain intra-arterial access. A VUS image was saved for the record.The modified Seldinger technique was used to place a 55F " Slender" sheath in the right radial artery. Weight based heparin was administered. Coronary angiography was done using 5 F catheters. Right coronary angiography was performed with a 5 French B2 MP. Left ventricular hemodymic recordings were done using the 5 French B2 MP catheter. Left coronary angiography was performed with a JL 3.5 cm.  Hemostasis was achieved using a pneumatic band.  During this procedure the patient is administered a  total of Versed 0.5 mg and Fentanyl 25 mg to achieve and maintain moderate conscious sedation.  The patient's heart rate, blood pressure, and oxygen saturation are monitored continuously during the procedure. The period of conscious sedation is 28 minutes, of which I was present face-to-face 100% of this time. Estimated blood loss <50 mL.   During this procedure medications were administered to achieve and maintain moderate conscious sedation while the patient's heart rate, blood pressure, and oxygen saturation were continuously monitored and I was present face-to-face 100% of this time.  Medications (Filter: Administrations occurring from 11/24/18 1525 to 11/24/18 1635) (important)  Continuous medications are totaled by the amount administered until 11/24/18 1635.  Medication Rate/Dose/Volume Action  Date Time   fentaNYL (SUBLIMAZE) injection (mcg) 25 mcg Given 11/24/18 1550   Total dose as of 11/24/18 1635        25 mcg        midazolam (VERSED) injection (mg) 1 mg Given 11/24/18 1550   Total dose as of 11/24/18 1635        1 mg        0.9 % sodium chloride infusion (mL/hr) 10 mL/hr New Bag/Given 11/24/18 1551   Dosing weight:  94.1 kg        Total dose as of 11/24/18 1635        Cannot be calculated        lidocaine (PF) (XYLOCAINE) 1 % injection (mL) 2 mL Given 11/24/18 1552   Total dose as of 11/24/18 1635        2 mL        Radial Cocktail/Verapamil only (mL) 10 mL Given 11/24/18 1554   Total dose as of 11/24/18 1635        10 mL        Heparin (Porcine) in NaCl 1000-0.9 UT/500ML-% SOLN (mL) 500 mL Given 11/24/18 1554   Total dose as of 11/24/18 1635 500 mL Given 1554   1,000 mL        heparin injection (Units) 4,500 Units Given 11/24/18 1605   Total dose as of 11/24/18 1635        4,500 Units        iohexol (OMNIPAQUE) 350 MG/ML injection (mL) 95 mL Given 11/24/18 1622   Total dose as of 11/24/18 1635        95 mL        furosemide (LASIX) injection  40 mg (mg) *Not included  in total [facility] Hold 11/24/18 1534   Total dose as of 11/24/18 1635        Cannot be calculated        hydrALAZINE (APRESOLINE) injection 5 mg (mg) *Not included in total [facility] Hold 11/24/18 1534   Total dose as of 11/24/18 1635        Cannot be calculated        Sedation Time  Sedation Time Physician-1: 29 minutes 51 seconds  Contrast  Medication Name Total Dose  iohexol (OMNIPAQUE) 350 MG/ML injection 95 mL    Radiation/Fluoro  Fluoro time: 8 (min) DAP: [address] (mGycm2) Cumulative Air Kerma: 460 (mGy)  Coronary Findings  Diagnostic Dominance: Co-dominant Left Main  Ost LM to Mid LM lesion 40% stenosed  Ost LM to Mid LM lesion is 40% stenosed.  Left Anterior Descending  Ost LAD lesion 50% stenosed  Ost LAD lesion is 50% stenosed.  Prox LAD to Mid LAD lesion 70% stenosed  Prox LAD to Mid LAD lesion is 70% stenosed.  First Diagonal Branch  Vessel is small in size.  1st Diag lesion 80% stenosed  1st Diag lesion is 80% stenosed.  First Septal Branch  Vessel is small in size.  Second Septal Branch  Vessel is small in size.  Ramus Intermedius  Ramus lesion 100% stenosed  Ramus lesion is 100% stenosed.  Left Circumflex  Dist Cx lesion 85% stenosed  Dist Cx lesion is 85% stenosed.  First Obtuse Marginal Branch  Vessel is small in size.  Second Obtuse Marginal Branch  Vessel is small in size.  Third Obtuse Marginal Branch  3rd Mrg lesion 99% stenosed  3rd Mrg lesion is 99% stenosed.  First Left Posterolateral Branch  1st LPL lesion 80% stenosed  1st LPL lesion is 80% stenosed.  Second Left Posterolateral Branch  Vessel is small in size.  Third Left Posterolateral Branch  Vessel is small in size.  Left Posterior Atrioventricular Artery  LPAV lesion 99% stenosed  LPAV lesion is 99% stenosed.  Right Coronary Artery  Ost RCA to Prox RCA lesion 100% stenosed  Ost RCA to Prox RCA lesion is 100% stenosed.  Right Ventricular Branch  Collaterals  RV Branch filled by  collaterals from 2nd Diag.    Right Posterior Descending Artery  RPDA lesion 60% stenosed  RPDA lesion is 60% stenosed.  Intervention  No interventions have been documented. Left Heart  Left Ventricle There is severe left ventricular systolic dysfunction. LV end diastolic pressure is normal. The left ventricular ejection fraction is 25-35% by visual estimate.  Coronary Diagrams  Diagnostic Dominance: Co-dominant       ECHOCARDIOGRAM COMPLETE Order #: [id number] Accession #: [id number] Patient Info  Patient name: Curtis [name]  MRN: [id number]  Age: 68 y.o.  Sex: male  Vitals  BP Height Weight BSA (Calculated - sq m)  130/91 5\' 9"  (1.753 m) 87.6 kg 2.08 sq meters  Study Result  Result status: Final result    ECHOCARDIOGRAM REPORT       Patient Name:   EUSTACIO ELLEN  Date of Exam: 11/24/2018 Medical Rec #:  [id number]  Height:       69.0 in Accession #:    [id number] Weight:       207.5 lb Date of Birth:  1950-05-26 BSA:          2.10 m Patient Age:    67 years   BP:  153/90 mmHg Patient Gender: M          HR:           92 bpm. Exam Location:  Inpatient  Procedure: 2D Echo, Cardiac Doppler and Color Doppler  Indications:    CHF   History:        Patient has no prior history of Echocardiogram examinations.                 Acute MI Signs/Symptoms:Shortness of Breath Risk                 Factors:Hypertension. Resp. failure, pul. edema.   Sonographer:    Lavenia Atlas Referring Phys: [id number] VASUNDHRA RATHORE  IMPRESSIONS    1. Left ventricular ejection fraction, by visual estimation, is 20 to 25%. The left ventricle has severely decreased function. Mildly increased left ventricular size. There is mildly increased left ventricular hypertrophy.  2. There is global left ventricular hypokinesis with inferior wall akinesis.  3. Abnormal septal motion consistent with left bundle branch block.  4. Indeterminate diastolic filling due to E-A fusion pattern  of LV diastolic filling.  5. Global right ventricle was not assessed.The right ventricular size is normal. No increase in right ventricular wall thickness.  6. Left atrial size was mildly dilated.  7. Right atrial size was normal.  8. Mild mitral annular calcification.  9. The mitral valve is grossly normal. Moderate to severe mitral valve regurgitation. No evidence of mitral stenosis. 10. Posterior leaflet tethering is noted. 11. The tricuspid valve is normal in structure. Tricuspid valve regurgitation is mild. 12. The aortic valve is normal in structure. Aortic valve regurgitation is mild to moderate by color flow Doppler. Structurally normal aortic valve, with no evidence of sclerosis or stenosis. 13. The pulmonic valve was grossly normal. Pulmonic valve regurgitation is not visualized by color flow Doppler. 14. Mildly elevated pulmonary artery systolic pressure.  FINDINGS  Left Ventricle: Left ventricular ejection fraction, by visual estimation, is 20 to 25%. The left ventricle has severely decreased function. There is mildly increased left ventricular hypertrophy. Eccentric left ventricular hypertrophy. Mildly increased  left ventricular size. Abnormal (paradoxical) septal motion, consistent with left bundle branch block. Spectral Doppler shows Indeterminate diastolic filling due to E-A fusion pattern of LV diastolic filling.    LV Wall Scoring: The basal and mid inferior septum and basal and mid inferior wall are akinetic. The LAD distribution, entire lateral wall, and entire apex are hypokinetic.  Right Ventricle: The right ventricular size is normal. No increase in right ventricular wall thickness. Global RV systolic function is was not assessed. The tricuspid regurgitant velocity is 3.06 m/s, and with an assumed right atrial pressure of 3 mmHg,  the estimated right ventricular systolic pressure is mildly elevated at 40.5 mmHg.  Left Atrium: Left atrial size was mildly  dilated.  Right Atrium: Right atrial size was normal in size  Pericardium: There is no evidence of pericardial effusion.  Mitral Valve: The mitral valve is grossly normal. Mild mitral annular calcification. No evidence of mitral valve stenosis by observation. Moderate to severe mitral valve regurgitation. Posterior leaflet tethering is noted.  Tricuspid Valve: The tricuspid valve is normal in structure. Tricuspid valve regurgitation is mild by color flow Doppler.  Aortic Valve: The aortic valve is normal in structure. Aortic valve regurgitation is mild to moderate by color flow Doppler. Aortic regurgitation PHT measures 298 msec. The aortic valve is structurally normal, with no evidence of sclerosis or stenosis.  Pulmonic Valve:  The pulmonic valve was grossly normal. Pulmonic valve regurgitation is not visualized by color flow Doppler.  Aorta: The aortic root and ascending aorta are structurally normal, with no evidence of dilitation.  IAS/Shunts: No atrial level shunt detected by color flow Doppler.     LEFT VENTRICLE          Normals PLAX 2D LVIDd:         5.40 cm  3.6 cm   Diastology                 Normals LVIDs:         3.70 cm  1.7 cm   LV e' lateral:   6.66 cm/s 6.42 cm/s LV PW:         1.20 cm  1.4 cm   LV E/e' lateral: 14.4      15.4 LV IVS:        1.30 cm  1.3 cm   LV e' medial:    4.56 cm/s 6.96 cm/s LVOT diam:     2.10 cm  2.0 cm   LV E/e' medial:  21.0      6.96 LV SV:         83 ml    79 ml LV SV Index:   38.39    45 ml/m2 LVOT Area:     3.46 cm 3.14 cm2    RIGHT VENTRICLE RV Basal diam:  2.60 cm RV S prime:     11.80 cm/s TAPSE (M-mode): 3.1 cm  LEFT ATRIUM             Index       RIGHT ATRIUM           Index LA diam:        3.80 cm 1.81 cm/m  RA Area:     13.20 cm LA Vol (A2C):   91.9 ml 43.80 ml/m RA Volume:   29.90 ml  14.25 ml/m LA Vol (A4C):   75.3 ml 35.88 ml/m LA Biplane Vol: 85.2 ml 40.60 ml/m  AORTIC VALVE             Normals LVOT  Vmax:   110.00 cm/s LVOT Vmean:  75.500 cm/s 75 cm/s LVOT VTI:    0.167 m     25.3 cm AI PHT:      298 msec   AORTA                 Normals Ao Root diam: 2.80 cm 31 mm  MITRAL VALVE              Normals    TRICUSPID VALVE             Normals MV Area (PHT): 8.43 cm              TR Peak grad:   37.5 mmHg MV PHT:        26.10 msec 55 ms      TR Vmax:        306.00 cm/s 288 cm/s MV Decel Time: 90 msec    187 ms MV E velocity: 95.60 cm/s  103 cm/s  SHUNTS MV A velocity: 130.00 cm/s 70.3 cm/s Systemic VTI:  0.17 m MV E/A ratio:  0.74        1.5       Systemic Diam: 2.10 cm    Rachelle HoraMihai Croitoru MD Electronically signed by Thurmon FairMihai Croitoru MD Signature Date/Time: 11/24/2018/2:38:55 PM         Recent  Radiology Findings:   Dg Chest Portable 1 View  Result Date: 11/24/2018 CLINICAL DATA:  Shortness of breath. EXAM: PORTABLE CHEST 1 VIEW COMPARISON:  None. FINDINGS: Mild cardiomegaly. Aortic atherosclerosis. Diffuse interstitial prominence with Kerley B-lines consistent with pulmonary edema. No confluent airspace disease. Left costophrenic angle not entirely included in the field of view. No large pleural effusion. No pneumothorax. No acute osseous abnormalities are seen. IMPRESSION: 1. Mild cardiomegaly.  Moderate pulmonary edema. 2.  Aortic Atherosclerosis (ICD10-I70.0). Electronically Signed   By: Keith Rake M.D.   On: 11/24/2018 00:01     I have independently reviewed the above radiologic studies and discussed with the patient   Recent Lab Findings: Lab Results  Component Value Date   WBC 16.2 (H) 11/25/2018   HGB 15.1 11/25/2018   HCT 43.4 11/25/2018   PLT 271 11/25/2018   GLUCOSE 134 (H) 11/25/2018   CHOL 233 (H) 11/25/2018   TRIG 155 (H) 11/25/2018   HDL 31 (L) 11/25/2018   LDLCALC 171 (H) 11/25/2018   ALT 29 11/25/2018   AST 33 11/25/2018   NA 141 11/25/2018   K 3.2 (L) 11/25/2018   CL 104 11/25/2018   CREATININE 1.22 11/25/2018   BUN 24 (H) 11/25/2018   CO2 26  11/25/2018   TSH 0.350 11/24/2018   HGBA1C 5.8 (H) 11/24/2018      Assessment / Plan:          I  spent {CHL ONC TIME VISIT - NLGXQ:[id number]} counseling the patient face to face.   Antony Odea, PA-C  11/25/2018 11:01 AM

## 2018-11-25 NOTE — Consult Note (Signed)
[address]       [place] [address]             [phone]        Rainen Vanrossum [facility] Medical Record #[id number] Date of Birth: 1950-08-10  Referring: Dr. Verdis Prime Primary Care: Patient, [name]:Kenneth Alona Bene, MD  Chief Complaint:    Chief Complaint  Patient presents with   Respiratory Distress    History of Present Illness:    Mr. Creighton is a 68 year old male with no known chronic illnesses who has not seen a physician in the past 7 years.  He is an active smoker and has family history significant for premature coronary artery disease.  He was in his usual state of health until about 8 hours prior to his arrival to the hospital via EMS on 11/23/18 when he says he was awakened from sleep with acute onset shortness of breath.  He denied having any chest pain at the time nor has he had any chest pain since then.  On arrival to the emergency room, he was noted to be diaphoretic, tachypneic, and tachycardic.  EKG demonstrated a narrow complex tachycardia with a heart rate of around 140.  He was treated with adenosine and his heart rate declined to about 104 with evidence of left bundle branch block.  Further work-up in the emergency room included chest x-ray that showed pulmonary edema consistent with acute heart failure.  His initial troponin was elevated at 63 but later serial studies increased to 4400 then further increased to in excess of 9000.  In the emergency room, the patient was treated with topical nitrates and IV Lasix and was placed on BiPAP with significant improvement in his symptoms.  He was admitted to the hospital with diagnosis of acute myocardial infarction.  He remained stable on heparin drip.  He went on to have left heart catheterization yesterday afternoon by Dr. Verdis Prime by way of the right radial artery.  This study demonstrated an occluded right coronary artery.  There was a 30 to 40% left main coronary artery  stenosis.  The LAD had a 50% proximal stenosis followed by a 70% proximal stenosis.  The first diagonal was severely diseased with an 80% stenosis.  The circumflex was codominant with an 85% mid to distal stenosis.  Ejection fraction was estimated at 25 to 30%.  Echocardiogram has been completed and showed an estimated ejection fraction of 20 to 25%.  There was moderate to severe mitral insufficiency and mild to moderate aortic insufficiency.  There were significant regional wall motion abnormalities involving the septum, inferior wall, lateral wall, and apex.  We have been asked to evaluate Mr. Coldwell for consideration of coronary revascularization as well as to address his valvular dysfunction in the setting of acute myocardial infarction with a presentation of acute, combined systolic and diastolic heart failure.    Current Activity/ Functional Status:    Zubrod Score: At the time of surgery this patients most appropriate activity status/level should be described as: []     0    Normal activity, no symptoms [x]     1    Restricted in physical strenuous activity but ambulatory, able to do out light work []     2    Ambulatory and capable of self care, unable to do work activities, up and about                 more than  50%  Of the time                            []     3    Only limited self care, in bed greater than 50% of waking hours []     4    Completely disabled, no self care, confined to bed or chair []     5    Moribund  Past Medical History:  Diagnosis Date   Tobacco use     Past Surgical History:  Procedure Laterality Date   LEFT HEART CATH AND CORONARY ANGIOGRAPHY N/A 11/24/2018   Procedure: LEFT HEART CATH AND CORONARY ANGIOGRAPHY;  Surgeon: , MD;  Location: [facility] INVASIVE CV [facility];  Service: Cardiovascular;  Laterality: N/A;    Social History   Tobacco Use  Smoking Status Current Some Day Smoker   Packs/day: 0.25   Types: Cigarettes  Smokeless Tobacco Never Used     Social History   Substance and Sexual Activity  Alcohol Use Never   Frequency: Never     Allergies  Allergen Reactions   Penicillins Rash    Did it involve swelling of the face/tongue/throat, SOB, or low BP? Unknown Did it involve sudden or severe rash/hives, skin peeling, or any reaction on the inside of your mouth or nose? Yes Did you need to seek medical attention at a hospital or doctor's office? Unknown When did it last happen? childhood If all above answers are NO, may proceed with cephalosporin use.     Current Facility-Administered Medications  Medication Dose Route Frequency Provider Last Rate Last Dose   0.9 %  sodium chloride infusion  250 mL Intravenous PRN , MD       acetaminophen (TYLENOL) tablet 650 mg  650 mg Oral Q4H PRN 11/26/2018, MD       aspirin chewable tablet 81 mg  81 mg Oral Daily Barrett, Rhonda G, PA-C       atorvastatin (LIPITOR) tablet 80 mg  80 mg Oral q1800 Lyn Records, MD       carvedilol (COREG) tablet 3.125 mg  3.125 mg Oral BID WC Barrett, Rhonda G, PA-C       furosemide (LASIX) injection 40 mg  40 mg Intravenous BID 05-02-1979, MD   40 mg at 11/25/18 0933   heparin ADULT infusion 100 units/mL (25000 units/261mL sodium chloride 0.45%)  1,450 Units/hr Intravenous Continuous Lyn Records, RPH 14.5 mL/hr at 11/25/18 1120 1,450 Units/hr at 11/25/18 1120   hydrALAZINE (APRESOLINE) injection 5 mg  5 mg Intravenous Q4H PRN Norva Pavlov, MD       nitroGLYCERIN 50 mg in dextrose 5 % 250 mL (0.2 mg/mL) infusion  20 mcg/min Intravenous Titrated 11/27/18, MD       ondansetron [facility]) injection 4 mg  4 mg Intravenous Q6H PRN Lyn Records, MD       sodium chloride flush (NS) 0.9 % injection 3 mL  3 mL Intravenous Q12H Lyn Records, MD       sodium chloride flush (NS) 0.9 % injection 3 mL  3 mL Intravenous PRN [facility], MD        No medications prior to admission.    Family History   Problem Relation Age of Onset   Heart attack Father    Heart attack Mother      Review of Systems:  Cardiac Review of Systems: Y or  [    ]= no  Chest Pain [    ]  Resting SOB [ y  ] Exertional SOB  [  ]  Orthopnea [ y ]   Pedal Edema [   ]    Palpitations [  ] Syncope  [  ]   Presyncope [   ]  General Review of Systems: [Y] = yes [  ]=no Constitional: recent weight change [  ]; anorexia [  ]; fatigue [  ]; nausea [  ]; night sweats [  ]; fever [  ]; or chills [  ]                                                                Eye : blurred vision [  ]; diplopia [   ]; vision changes [  ];  Amaurosis fugax[  ]; Resp: cough [  ];  wheezing[  ];  hemoptysis[  ]; shortness of breath[  ]; paroxysmal nocturnal dyspnea[  ]; dyspnea on exertion[  ]; or orthopnea[  ];  GI:  gallstones[  ], vomiting[  ];  dysphagia[  ]; melena[  ];  hematochezia [  ]; heartburn[  ];   Hx of  Colonoscopy[  ]; GU: kidney stones [  ]; hematuria[  ];   dysuria [  ];  nocturia[  ];  history of     obstruction [  ]; urinary frequency [  ]             Skin: rash, swelling[  ];, hair loss[  ];  peripheral edema[  ];  or itching[  ]; Musculosketetal: myalgias[  ];  joint swelling[  ];  joint erythema[  ];  joint pain[  ];  back pain[  ];  Heme/Lymph: bruising[  ];  bleeding[  ];  anemia[  ];  Neuro: TIA[  ];  headaches[  ];  stroke[  ];  vertigo[  ];  seizures[  ];   paresthesias[  ];  difficulty walking[  ];  Psych:depression[  ]; anxiety[  ];  Endocrine: diabetes[  ];  thyroid dysfunction[  ];              Physical Exam: BP (!) 130/91 (BP Location: Left Leg)    Pulse 86    Temp 98.2 F (36.8 C) (Oral)    Resp 17    Ht 5\' 9"  (1.753 m)    Wt 87.6 kg    SpO2 94%    BMI 28.52 kg/m    General appearance: alert, cooperative and appears quite anxious. Head: Normocephalic, without obvious abnormality, atraumatic Neck: no adenopathy, no carotid bruit, no JVD, supple, symmetrical, trachea midline and thyroid not  enlarged, symmetric, no tenderness/mass/nodules Resp: clear to auscultation bilaterally Cardio: Normal S1-S2.  There is a murmur with both systolic and diastolic components. GI: soft, non-tender; bowel sounds normal; no masses,  no organomegaly Extremities: There is a dressing over the right radial artery arteriotomy.  Radial pulse is palpable and the hand is well-perfused.  The left hand is also well perfused with a palpable pulse.  Lower extremities are well perfused with no edema and no significant varicosities.  The right posterior tibial artery is easily palpableUnable to palpate  any other pedal pulses. Neurologic: Grossly normal  Diagnostic Studies & Laboratory data:  LEFT HEART CATH AND CORONARY ANGIOGRAPHY  Conclusion   Acute systolic heart failure with pulmonary edema, echo LVEF 25%, and normal LVEDP after diuresis.  Assume that the patient has stunned myocardium (No symptoms prior to presentation).  Severe multivessel coronary disease with 40% tubular left main.    50 followed by 60 to 70% proximal LAD tandem lesions.  Severe 80% diffuse disease in the large first diagonal.    The circumflex contains multifocal high-grade stenoses in the mid body and distal before the origin of the left PDA.  The large third obtuse marginal contains 99% stenosis.    The right coronary is codominant and is totally occluded with collaterals filling from left to right and right to right.  RECOMMENDATIONS:   After stabilization, the patient should be considered for surgical revascularization if targets are adequate.  TCTS consultation is recommended but because of the late completion of the procedure the office was closed.  Please consult in a.m.  Recommendations  Antiplatelet/Anticoag Recommend Aspirin 81mg  daily for moderate CAD.  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  The right radial area was sterilely prepped and draped. Intravenous sedation with Versed and fentanyl was administered. 1% Xylocaine was infiltrated to achieve local analgesia. Using real-time vascular ultrasound, a double wall stick with an angiocath was utilized to obtain intra-arterial access. A VUS image was saved for the record.The modified Seldinger technique was used to place a 69F " Slender" sheath in the right radial artery. Weight based heparin was administered. Coronary angiography was done using 5 F catheters. Right coronary angiography was performed with a 5 French B2 MP. Left ventricular hemodymic recordings were done using the 5 French B2 MP catheter. Left coronary angiography was performed with a JL 3.5 cm.  Hemostasis was achieved using a pneumatic band.  During this procedure the patient is administered a total of Versed 0.5 mg and Fentanyl 25 mg to achieve and maintain moderate conscious sedation.  The patient's heart rate, blood pressure, and oxygen saturation are monitored continuously during the procedure. The period of conscious sedation is 28 minutes, of which I was present face-to-face 100% of this time. Estimated blood loss <50 mL.   During this procedure medications were administered to achieve and maintain moderate conscious sedation while the patient's heart rate, blood pressure, and oxygen saturation were continuously monitored and I was present face-to-face 100% of this time.  Medications (Filter: Administrations occurring from 11/24/18 1525 to 11/24/18 1635) (important)  Continuous medications are totaled by the amount administered until 11/24/18 1635.  Medication Rate/Dose/Volume Action  Date Time   fentaNYL (SUBLIMAZE) injection (mcg) 25 mcg Given 11/24/18 1550     Total dose as of 11/24/18 1635        25 mcg        midazolam (VERSED) injection (mg) 1 mg Given 11/24/18 1550   Total dose as of 11/24/18 1635        1 mg        0.9 % sodium chloride infusion (mL/hr) 10 mL/hr New Bag/Given 11/24/18 1551   Dosing weight:  94.1 kg  Total dose as of 11/24/18 1635        Cannot be calculated        lidocaine (PF) (XYLOCAINE) 1 % injection (mL) 2 mL Given 11/24/18 1552   Total dose as of 11/24/18 1635        2 mL        Radial Cocktail/Verapamil only (mL) 10 mL Given 11/24/18 1554   Total dose as of 11/24/18 1635        10 mL        Heparin (Porcine) in NaCl 1000-0.9 UT/500ML-% SOLN (mL) 500 mL Given 11/24/18 1554   Total dose as of 11/24/18 1635 500 mL Given 1554   1,000 mL        heparin injection (Units) 4,500 Units Given 11/24/18 1605   Total dose as of 11/24/18 1635        4,500 Units        iohexol (OMNIPAQUE) 350 MG/ML injection (mL) 95 mL Given 11/24/18 1622   Total dose as of 11/24/18 1635        95 mL        furosemide (LASIX) injection 40 mg (mg) *Not included in total [facility] Hold 11/24/18 1534   Total dose as of 11/24/18 1635        Cannot be calculated        hydrALAZINE (APRESOLINE) injection 5 mg (mg) *Not included in total [facility] Hold 11/24/18 1534   Total dose as of 11/24/18 1635        Cannot be calculated        Sedation Time  Sedation Time Physician-1: 29 minutes 51 seconds  Contrast  Medication Name Total Dose  iohexol (OMNIPAQUE) 350 MG/ML injection 95 mL    Radiation/Fluoro  Fluoro time: 8 (min) DAP: 16109 (mGycm2) Cumulative Air Kerma: 460 (mGy)  Coronary Findings  Diagnostic Dominance: Co-dominant Left Main  Ost LM to Mid LM lesion 40% stenosed  Ost LM to Mid LM lesion is 40% stenosed.  Left Anterior Descending  Ost LAD lesion 50% stenosed  Ost LAD lesion is 50% stenosed.  Prox LAD to Mid LAD lesion 70% stenosed  Prox LAD to Mid LAD lesion is 70% stenosed.  First Diagonal Branch  Vessel is small in  size.  1st Diag lesion 80% stenosed  1st Diag lesion is 80% stenosed.  First Septal Branch  Vessel is small in size.  Second Septal Branch  Vessel is small in size.  Ramus Intermedius  Ramus lesion 100% stenosed  Ramus lesion is 100% stenosed.  Left Circumflex  Dist Cx lesion 85% stenosed  Dist Cx lesion is 85% stenosed.  First Obtuse Marginal Branch  Vessel is small in size.  Second Obtuse Marginal Branch  Vessel is small in size.  Third Obtuse Marginal Branch  3rd Mrg lesion 99% stenosed  3rd Mrg lesion is 99% stenosed.  First Left Posterolateral Branch  1st LPL lesion 80% stenosed  1st LPL lesion is 80% stenosed.  Second Left Posterolateral Branch  Vessel is small in size.  Third Left Posterolateral Branch  Vessel is small in size.  Left Posterior Atrioventricular Artery  LPAV lesion 99% stenosed  LPAV lesion is 99% stenosed.  Right Coronary Artery  Ost RCA to Prox RCA lesion 100% stenosed  Ost RCA to Prox RCA lesion is 100% stenosed.  Right Ventricular Branch  Collaterals  RV Branch filled by collaterals from 2nd Diag.    Right Posterior Descending Artery  RPDA lesion 60% stenosed  RPDA lesion is 60% stenosed.  Intervention  No interventions have been documented. Left Heart  Left Ventricle There is severe left ventricular systolic dysfunction. LV end diastolic pressure is normal. The left ventricular ejection fraction is 25-35% by visual estimate.  Coronary Diagrams  Diagnostic Dominance: Co-dominant       ECHOCARDIOGRAM COMPLETE Order #: [id number] Accession #: [id number] Patient Info  Patient name: Marvelle Caudill  MRN: [id number]  Age: 68 y.o.  Sex: male  Vitals  BP Height Weight BSA (Calculated - sq m)  130/91 5\' 9"  (1.753 m) 87.6 kg 2.08 sq meters  Study Result  Result status: Final result    ECHOCARDIOGRAM REPORT       Patient Name:   SAIM ALMANZA  Date of Exam: 11/24/2018 Medical Rec #:  [id number]  Height:       69.0 in Accession #:     [id number] Weight:       207.5 lb Date of Birth:  Aug 15, 1950 BSA:          2.10 m Patient Age:    67 years   BP:           153/90 mmHg Patient Gender: M          HR:           92 bpm. Exam Location:  Inpatient  Procedure: 2D Echo, Cardiac Doppler and Color Doppler  Indications:    CHF   History:        Patient has no prior history of Echocardiogram examinations.                 Acute MI Signs/Symptoms:Shortness of Breath Risk                 Factors:Hypertension. Resp. failure, pul. edema.   Sonographer:    Lavenia Atlas Referring Phys: [id number] VASUNDHRA RATHORE  IMPRESSIONS    1. Left ventricular ejection fraction, by visual estimation, is 20 to 25%. The left ventricle has severely decreased function. Mildly increased left ventricular size. There is mildly increased left ventricular hypertrophy.  2. There is global left ventricular hypokinesis with inferior wall akinesis.  3. Abnormal septal motion consistent with left bundle branch block.  4. Indeterminate diastolic filling due to E-A fusion pattern of LV diastolic filling.  5. Global right ventricle was not assessed.The right ventricular size is normal. No increase in right ventricular wall thickness.  6. Left atrial size was mildly dilated.  7. Right atrial size was normal.  8. Mild mitral annular calcification.  9. The mitral valve is grossly normal. Moderate to severe mitral valve regurgitation. No evidence of mitral stenosis. 10. Posterior leaflet tethering is noted. 11. The tricuspid valve is normal in structure. Tricuspid valve regurgitation is mild. 12. The aortic valve is normal in structure. Aortic valve regurgitation is mild to moderate by color flow Doppler. Structurally normal aortic valve, with no evidence of sclerosis or stenosis. 13. The pulmonic valve was grossly normal. Pulmonic valve regurgitation is not visualized by color flow Doppler. 14. Mildly elevated pulmonary artery systolic  pressure.  FINDINGS  Left Ventricle: Left ventricular ejection fraction, by visual estimation, is 20 to 25%. The left ventricle has severely decreased function. There is mildly increased left ventricular hypertrophy. Eccentric left ventricular hypertrophy. Mildly increased  left ventricular size. Abnormal (paradoxical) septal motion, consistent with left bundle branch block. Spectral Doppler shows Indeterminate diastolic filling due to E-A fusion pattern of LV diastolic filling.    LV Wall Scoring: The basal and  mid inferior septum and basal and mid inferior wall are akinetic. The LAD distribution, entire lateral wall, and entire apex are hypokinetic.  Right Ventricle: The right ventricular size is normal. No increase in right ventricular wall thickness. Global RV systolic function is was not assessed. The tricuspid regurgitant velocity is 3.06 m/s, and with an assumed right atrial pressure of 3 mmHg,  the estimated right ventricular systolic pressure is mildly elevated at 40.5 mmHg.  Left Atrium: Left atrial size was mildly dilated.  Right Atrium: Right atrial size was normal in size  Pericardium: There is no evidence of pericardial effusion.  Mitral Valve: The mitral valve is grossly normal. Mild mitral annular calcification. No evidence of mitral valve stenosis by observation. Moderate to severe mitral valve regurgitation. Posterior leaflet tethering is noted.  Tricuspid Valve: The tricuspid valve is normal in structure. Tricuspid valve regurgitation is mild by color flow Doppler.  Aortic Valve: The aortic valve is normal in structure. Aortic valve regurgitation is mild to moderate by color flow Doppler. Aortic regurgitation PHT measures 298 msec. The aortic valve is structurally normal, with no evidence of sclerosis or stenosis.  Pulmonic Valve: The pulmonic valve was grossly normal. Pulmonic valve regurgitation is not visualized by color flow Doppler.  Aorta: The aortic  root and ascending aorta are structurally normal, with no evidence of dilitation.  IAS/Shunts: No atrial level shunt detected by color flow Doppler.     LEFT VENTRICLE          Normals PLAX 2D LVIDd:         5.40 cm  3.6 cm   Diastology                 Normals LVIDs:         3.70 cm  1.7 cm   LV e' lateral:   6.66 cm/s 6.42 cm/s LV PW:         1.20 cm  1.4 cm   LV E/e' lateral: 14.4      15.4 LV IVS:        1.30 cm  1.3 cm   LV e' medial:    4.56 cm/s 6.96 cm/s LVOT diam:     2.10 cm  2.0 cm   LV E/e' medial:  21.0      6.96 LV SV:         83 ml    79 ml LV SV Index:   38.39    45 ml/m2 LVOT Area:     3.46 cm 3.14 cm2    RIGHT VENTRICLE RV Basal diam:  2.60 cm RV S prime:     11.80 cm/s TAPSE (M-mode): 3.1 cm  LEFT ATRIUM             Index       RIGHT ATRIUM           Index LA diam:        3.80 cm 1.81 cm/m  RA Area:     13.20 cm LA Vol (A2C):   91.9 ml 43.80 ml/m RA Volume:   29.90 ml  14.25 ml/m LA Vol (A4C):   75.3 ml 35.88 ml/m LA Biplane Vol: 85.2 ml 40.60 ml/m  AORTIC VALVE             Normals LVOT Vmax:   110.00 cm/s LVOT Vmean:  75.500 cm/s 75 cm/s LVOT VTI:    0.167 m     25.3 cm AI PHT:      298 msec  AORTA                 Normals Ao Root diam: 2.80 cm 31 mm  MITRAL VALVE              Normals    TRICUSPID VALVE             Normals MV Area (PHT): 8.43 cm              TR Peak grad:   37.5 mmHg MV PHT:        26.10 msec 55 ms      TR Vmax:        306.00 cm/s 288 cm/s MV Decel Time: 90 msec    187 ms MV E velocity: 95.60 cm/s  103 cm/s  SHUNTS MV A velocity: 130.00 cm/s 70.3 cm/s Systemic VTI:  0.17 m MV E/A ratio:  0.74        1.5       Systemic Diam: 2.10 cm    Rachelle HoraMihai Croitoru MD Electronically signed by Thurmon FairMihai Croitoru MD Signature Date/Time: 11/24/2018/2:38:55 PM     Recent Radiology Findings:   Dg Chest Portable 1 View  Result Date: 11/24/2018 CLINICAL DATA:  Shortness of breath. EXAM: PORTABLE CHEST 1 VIEW COMPARISON:  None. FINDINGS:  Mild cardiomegaly. Aortic atherosclerosis. Diffuse interstitial prominence with Kerley B-lines consistent with pulmonary edema. No confluent airspace disease. Left costophrenic angle not entirely included in the field of view. No large pleural effusion. No pneumothorax. No acute osseous abnormalities are seen. IMPRESSION: 1. Mild cardiomegaly.  Moderate pulmonary edema. 2.  Aortic Atherosclerosis (ICD10-I70.0). Electronically Signed   By: Narda RutherfordMelanie  Sanford M.D.   On: 11/24/2018 00:01     I have independently reviewed the above radiologic studies and discussed with the patient   Recent Lab Findings: Lab Results  Component Value Date   WBC 16.2 (H) 11/25/2018   HGB 15.1 11/25/2018   HCT 43.4 11/25/2018   PLT 271 11/25/2018   GLUCOSE 134 (H) 11/25/2018   CHOL 233 (H) 11/25/2018   TRIG 155 (H) 11/25/2018   HDL 31 (L) 11/25/2018   LDLCALC 171 (H) 11/25/2018   ALT 29 11/25/2018   AST 33 11/25/2018   NA 141 11/25/2018   K 3.2 (L) 11/25/2018   CL 104 11/25/2018   CREATININE 1.22 11/25/2018   BUN 24 (H) 11/25/2018   CO2 26 11/25/2018   TSH 0.350 11/24/2018   HGBA1C 5.8 (H) 11/24/2018      Assessment / Plan:      Very very anxious 68 year old male who had no known prior medical history and no visits to healthcare provider in the past 7 years presents with acute myocardial infarction manifested by acute systolic and diastolic heart failure.  Peak troponin thus far is in excess of 9000.  He has multivessel coronary artery disease as well as mild to moderate aortic insufficiency and moderate to severe mitral insufficiency.  He is stable and presently denies any chest pain or shortness of breath.  He is resting quietly in bed with heparin infusing and is on nasal cannula oxygen.  Coronary artery revascularization and possible mitral valve repair or replacement is indicated and briefly described to the patient.  He states that, at this time, he is interested in considering surgery.  He was given an  opportunity to ask further questions and elaborate on his decision but he preferred not to do so.  I explained that we would like to do further testing including carotid Dopplers,  pulmonary function testing, and lower extremity arterial duplex to provide optimal information for surgical decision-making.  He was not opposed to moving ahead with these studies.  I reassured him that our team was available to answer any questions he may have and that we will continue to follow his progress while here in the hospital.    I  spent 40 minutes counseling the patient face to face.   Leary Roca, PA-C [phone]  11/25/2018 11:39 AM

## 2018-11-25 NOTE — Progress Notes (Signed)
ANTICOAGULATION CONSULT NOTE - Follow Up Consult  Pharmacy Consult for Heparin Indication: chest pain/ACS  Allergies  Allergen Reactions  . Penicillins Rash    Did it involve swelling of the face/tongue/throat, SOB, or low BP? Unknown Did it involve sudden or severe rash/hives, skin peeling, or any reaction on the inside of your mouth or nose? Yes Did you need to seek medical attention at a hospital or doctor's office? Unknown When did it last happen? childhood If all above answers are "NO", may proceed with cephalosporin use.     Patient Measurements: Height: 5\' 9"  (175.3 cm) Weight: 193 lb 2 oz (87.6 kg) IBW/kg (Calculated) : 70.7 Heparin Dosing Weight: 92 kg  Vital Signs: Temp: 98.2 F (36.8 C) (09/23 0753) Temp Source: Oral (09/23 0753) BP: 130/91 (09/23 0753) Pulse Rate: 86 (09/23 0753)  Labs: Recent Labs    11/23/18 2328  11/23/18 2351 11/24/18 0106 11/24/18 0327 11/24/18 0337 11/24/18 0820 11/24/18 1336 11/25/18 0810  HGB 16.6   < > 17.0 15.3  --  16.2  --   --  15.1  HCT 49.0   < > 50.0 45.0  --  48.7  --   --  43.4  PLT 395  --   --   --   --  285  --   --  271  HEPARINUNFRC  --   --   --   --   --   --   --  0.16* 0.42  CREATININE 1.42*  --  1.30*  --  1.30*  --   --   --   --   TROPONINIHS 63*  --   --   --  4,477*  --  9,016*  --   --    < > = values in this interval not displayed.    Estimated Creatinine Clearance: 62.1 mL/min (A) (by C-G formula based on SCr of 1.3 mg/dL (H)).   Medications:  Infusions:  . heparin 1,450 Units/hr (11/25/18 0400)  . nitroGLYCERIN      Assessment: 68 year old male presented on 11/23/18 with SOB, concern for ACS.  No anticoagulation PTA.   Heparin infusion started on 9/22.   S/p cath done 9/22  EF 25%. Severe multivessel CAD.  Heparin resumed post cath.   This AM heparin level is therapeutic at 0.42 on heparin rate 1450 units/hr.  No bleeding noted. CBC wnl/stable. TCTS consulted for CABG  evaluation   Goal of Therapy:  Heparin level 0.3-0.7 units/ml Monitor platelets by anticoagulation protocol: Yes   Plan:  Continue Heparin infusion 1450 units/hr.  Recheck Heparin level in 6 hours to confirm remains therapeutic.  Daily Heparin level and CBC while on therapy.   Nicole Cella, RPh Clinical Pharmacist Clinical phone [phone]  Please refer to [facility] for [place] numbers 11/25/2018,9:46 AM

## 2018-11-26 ENCOUNTER — Encounter (HOSPITAL_COMMUNITY)
Admission: EM | Disposition: A | Discharge status: Rehabilitation Facility | Payer: Self-pay | Source: Home / Self Care | Attending: Cardiothoracic Surgery

## 2018-11-26 ENCOUNTER — Inpatient Hospital Stay (HOSPITAL_COMMUNITY): Payer: Medicare Other

## 2018-11-26 ENCOUNTER — Other Ambulatory Visit: Payer: Self-pay

## 2018-11-26 ENCOUNTER — Encounter (HOSPITAL_COMMUNITY): Payer: Self-pay | Admitting: *Deleted

## 2018-11-26 DIAGNOSIS — I340 Nonrheumatic mitral (valve) insufficiency: Secondary | ICD-10-CM

## 2018-11-26 HISTORY — PX: TEE WITHOUT CARDIOVERSION: SHX5443

## 2018-11-26 LAB — COMPREHENSIVE METABOLIC PANEL
ALT: 27 U/L (ref 0–44)
AST: 24 U/L (ref 15–41)
Albumin: 3.8 g/dL (ref 3.5–5.0)
Alkaline Phosphatase: 71 U/L (ref 38–126)
Anion gap: 12 (ref 5–15)
BUN: 25 mg/dL — ABNORMAL HIGH (ref 8–23)
CO2: 28 mmol/L (ref 22–32)
Calcium: 8.8 mg/dL — ABNORMAL LOW (ref 8.9–10.3)
Chloride: 98 mmol/L (ref 98–111)
Creatinine, Ser: 1.18 mg/dL (ref 0.61–1.24)
GFR calc Af Amer: >60 mL/min (ref >60)
GFR calc non Af Amer: >60 mL/min (ref >60)
Glucose, Bld: 136 mg/dL — ABNORMAL HIGH (ref 70–99)
Potassium: 3.3 mmol/L — ABNORMAL LOW (ref 3.5–5.1)
Sodium: 138 mmol/L (ref 135–145)
Total Bilirubin: 2.5 mg/dL — ABNORMAL HIGH (ref 0.3–1.2)
Total Protein: 6.9 g/dL (ref 6.5–8.1)

## 2018-11-26 LAB — CBC
HCT: 46.7 % (ref 39.0–52.0)
Hemoglobin: 15.8 g/dL (ref 13.0–17.0)
MCH: 33.1 pg (ref 26.0–34.0)
MCHC: 33.8 g/dL (ref 30.0–36.0)
MCV: 97.9 fL (ref 80.0–100.0)
Platelets: 304 10*3/uL (ref 150–400)
RBC: 4.77 MIL/uL (ref 4.22–5.81)
RDW: 12.1 % (ref 11.5–15.5)
WBC: 12.5 10*3/uL — ABNORMAL HIGH (ref 4.0–10.5)
nRBC: 0 % (ref 0.0–0.2)

## 2018-11-26 LAB — PULMONARY FUNCTION TEST
FEF 25-75 Pre: 1.72 L/sec
FEF2575-%Pred-Pre: 65 %
FEV1-%Pred-Pre: 50 %
FEV1-Pre: 1.66 L
FEV1FVC-%Pred-Pre: 108 %
FEV6-%Pred-Pre: 48 %
FEV6-Pre: 2.05 L
FEV6FVC-%Pred-Pre: 105 %
FVC-%Pred-Pre: 46 %
FVC-Pre: 2.05 L
Pre FEV1/FVC ratio: 81 %
Pre FEV6/FVC Ratio: 100 %

## 2018-11-26 LAB — MAGNESIUM: Magnesium: 1.9 mg/dL (ref 1.7–2.4)

## 2018-11-26 LAB — HEPARIN LEVEL (UNFRACTIONATED): Heparin Unfractionated: 0.34 IU/mL (ref 0.30–0.70)

## 2018-11-26 LAB — MRSA PCR SCREENING: MRSA by PCR: NEGATIVE

## 2018-11-26 SURGERY — ECHOCARDIOGRAM, TRANSESOPHAGEAL
Anesthesia: Moderate Sedation

## 2018-11-26 MED ORDER — FENTANYL CITRATE (PF) 100 MCG/2ML IJ SOLN
INTRAMUSCULAR | Status: DC | PRN
  Administered 2018-11-26 (×2): 25 ug via INTRAVENOUS

## 2018-11-26 MED ORDER — FENTANYL CITRATE (PF) 100 MCG/2ML IJ SOLN
INTRAMUSCULAR | Status: AC
  Filled 2018-11-26: qty 2

## 2018-11-26 MED ORDER — MIDAZOLAM HCL (PF) 5 MG/ML IJ SOLN
INTRAMUSCULAR | Status: AC
  Filled 2018-11-26: qty 2

## 2018-11-26 MED ORDER — MIDAZOLAM HCL 5 MG/5ML IJ SOLN
INTRAMUSCULAR | Status: DC | PRN
  Administered 2018-11-26 (×2): 1 mg via INTRAVENOUS
  Administered 2018-11-26: 2 mg via INTRAVENOUS

## 2018-11-26 MED ORDER — BUTAMBEN-TETRACAINE-BENZOCAINE 2-2-14 % EX AERO
INHALATION_SPRAY | CUTANEOUS | Status: DC | PRN
  Administered 2018-11-26: 2 via TOPICAL

## 2018-11-26 MED ORDER — POTASSIUM CHLORIDE CRYS ER 20 MEQ PO TBCR
40.0000 meq | EXTENDED_RELEASE_TABLET | ORAL | Status: AC
  Administered 2018-11-26 – 2018-11-27 (×2): 40 meq via ORAL
  Filled 2018-11-26 (×2): qty 2

## 2018-11-26 NOTE — Progress Notes (Signed)
ANTICOAGULATION CONSULT NOTE - Follow Up Consult  Pharmacy Consult for Heparin Indication: 3VCAD  Allergies  Allergen Reactions  . Penicillins Rash    Did it involve swelling of the face/tongue/throat, SOB, or low BP? Unknown Did it involve sudden or severe rash/hives, skin peeling, or any reaction on the inside of your mouth or nose? Yes Did you need to seek medical attention at a hospital or doctor's office? Unknown When did it last happen? childhood If all above answers are "NO", may proceed with cephalosporin use.     Patient Measurements: Height: 5\' 9"  (175.3 cm) Weight: 189 lb 2.5 oz (85.8 kg) IBW/kg (Calculated) : 70.7 Heparin Dosing Weight: 88.6 kg  Vital Signs: Temp: 98.3 F (36.8 C) (09/24 1025) Temp Source: Oral (09/24 1025) BP: 146/90 (09/24 1025) Pulse Rate: 81 (09/24 1025)  Labs: Recent Labs    11/23/18 2328  11/24/18 0327  11/24/18 0820  11/25/18 0810 11/25/18 0834 11/25/18 1318 11/25/18 1423 11/26/18 0158  HGB 16.6   < >  --    < >  --   --  15.1  --  16.6  --  15.8  HCT 49.0   < >  --    < >  --   --  43.4  --  47.6  --  46.7  PLT 395  --   --    < >  --   --  271  --  298  --  304  HEPARINUNFRC  --   --   --   --   --    < > 0.42  --   --  0.45 0.34  CREATININE 1.42*   < > 1.30*  --   --   --   --  1.22  --   --  1.18  TROPONINIHS 63*  --  4,477*  --  9,016*  --   --   --   --   --   --    < > = values in this interval not displayed.    Estimated Creatinine Clearance: 67.7 mL/min (by C-G formula based on SCr of 1.18 mg/dL).   Assessment:  Anticoag: ACS. Heparin resumed post-cath 9/22.  9/22: Cath: EF 25%. Severe multivessel CAD.  TCTS consulted for CABG evaluation. Heparin level remains therapeutic this morning on IV heparin drip at 1450 units/hr. CBC wnl stable, no bleeding reported. TEE done 9/24  Goal of Therapy:  Heparin level 0.3-0.7 units/ml Monitor platelets by anticoagulation protocol: Yes   Plan:  Continue Heparin  infusion 1450 units/hr.  Daily Heparin level and CBC while on therapy.  TCTS consulted for CABG evaluation   Nicole Cella, RPh Clinical Staff Pharmacist [phone] Please check AMION for all [place] phone numbers After 10:00 PM, call [place] [phone] 11/26/2018,10:36 AM

## 2018-11-26 NOTE — Progress Notes (Signed)
PROGRESS NOTE    Kelton Bultman  QVZ:[id number] DOB: [birth year] DOA: 11/23/2018 PCP: Patient, No Pcp Per   Brief Narrative:  Harland Aguiniga Amisha Pospisil 68 y.o.malewithnosignificant past medical history presenting to the hospitalvia EMS for evaluation of sudden onset shortness of breath which started at 10 PM yesterday. On EMS arrival, patient was noted to be pale and diaphoretic. Noted to be in significant distress, diaphoretic, tachycardic, and tachypneic. Oxygen saturation 60% on room air, improved to 84% with BVM. EMS reported multiple syncopal episodes in route but patient never lost pulses and stayed in sinus tachycardia. He was given epinephrine 0.3 mg, IV magnesium 2 g, and IV Solu-Medrol 125 mg in route. Patient states last night around 10 PM he woke up from his sleep feeling very short of breath. He was feeling fine during the day and was not having any difficulty breathing. Denies chest pain, cough, fevers, or chills. Denies history of hypertension or congestive heart failure. States the last time he saw Tresean Mattix doctor was 7 years ago. He is currently not on any medications. No other complaints.  ED Course:Awake and alert on arrival to the ED. Tachycardic and tachypneic. Placed on BiPAP. ABG with pH 7.21, PCO2 63, and PO2 408.Systolic >329.Afebrile. White count 21.1 with left shift. BNP 507. High-sensitivity troponin 63. Heart rate persistently in the 150s in the ED and initial EKG showing narrow complex tachycardia. BUN 16, creatinine 1.4. No prior labs for comparison. T bili 2.2, AST 45. Alk phos and ALT normal. SARS-CoV-2 test negative. Chest x-ray showing mild cardiomegaly and moderate pulmonary edema. Patient received adenosine 6 mg, nitroglycerin 2% ointment, and Lasix 40 mg in the ED. Repeat ABG showing improvement with pH 7.35 and PCO2 44.Repeat EKG with sinus tachycardia, LBBB, and T wave inversions in inferior leads. No prior EKG for comparison.   Assessment & Plan:    Principal Problem:   NSTEMI (non-ST elevated myocardial infarction) ([place]) Active Problems:   Acute respiratory failure with hypoxia and hypercapnia (HCC)   Pulmonary edema   Hypertensive emergency   Elevated troponin   Leukocytosis   Hypertension  Acute hypoxic hypercapnic respiratory failure  Acute Systolic Heart Failure: new onset CHF vs flash pulm edema from HTN emergency.  No hx HTN but hasn't seen doctor in 7 yrs. Oxygen saturation 60% on room air and required BVM ventilation. Initial ABG with pH 7.21 and PCO2 63. Placed on BiPAP and subsequent ABG showing improvement of acidosis and hypercapnia. Systolic blood pressure above 200 on arrival to the ED. BNP 507. Chest x-ray showing mild cardiomegaly and moderate pulmonary edema.Work of breathing and blood pressure improved significantly after BiPAP,IV Lasix, and Nitropaste. Currently requiring 2 L supplemental oxygen. -Monitor closely in the progressive care unit - Continue IV Lasix 40 mg twice daily - I/O, daily weights (good uop, weight downtrending) -Echocardiogram - with EF 20-25%, LV severely decreased function, global L ventricular hypokinesis with inferior wall akinesis -low-sodium diet with fluid restriction -Continue supplemental oxygen, wean as tolerated - Cardiology consulting, appreciate recs  Elevated troponin/ NSTEMI: concern for ACS. Initial high-sensitivity troponin 63, repeat significantly elevated at 4477 and then rising to 9016. EKG with left bundle branch block and worsening QTC prolongation (QTc 526 - more recently improved to ~470s). -Aspirin 324 mg -Heparin infusion started -Continue to trend troponin -Check magnesium level -Cardiology c/s, appreciate recs - pt not sure he wants CABG - continue ASA, coreg 3.125 mg BID - titrate as able - LDL 171, HDL 31.  A1c 5.8.  Continue  statin. - cath with severe multivessel disease (see report).  Recommended CT surgery c/s. - Appreciate CT surgery consult - planning  for TEE (TEE today with EF 25-30%, mod mitral valve regurgitation, aortic valve without sclerosis or stenosis - see repor) , PFT's, LE arterial duplex  HTN urgency: BP's 180/ 105 to > 200's on presentation. Has not seen doctor in 7 years.  BP's down w/ IV ntg and lasix.  - follow serial BP's - improved today - cardiology following will hold off on initiating BP meds as pt is pending ischemia/ CHF work-up  Renal Insufficiency BUN 16, creatinine 1.4 on presentation. No prior labs for comparison.  - improved to 1.18 today -Repeat BMP in [place].m. -Monitor urine output - check UA (RBC's - will need outpatient follow up)  Hyperglycemia Random blood glucose 278. -Check A1c 5.8 -continue to monitor - consider adding SSI if hyperglycemia  Leukocytosis Afebrile. White count 21.1 with left shift. Dyspnea was acute onset and chest x-ray with findings consistent with pulmonary edema. SARS-CoV-2 test negative. Not endorsing any infectious symptoms. -Improving, continue to follow WBC count -Check procalcitonin level (wnl) -UA pending (not c/w UTI)  Tachycardia Heart rate persistently in the 150s and initial EKG showing narrow complex tachycardia.Patient received adenosine 6 mg in the ED. Repeat EKG showing sinus tachycardia.  - Heart rate currently improved and in the 80s to 90s. -Cardiac monitoring -Check TSH (wnl), free T4 levels (elevated to 1.38) - would repeat outpatient  Elevated T bili T bili 2.2, no significant elevation of remainder of LFTs. - Fluctuating, continue to monitor (2.5)   DVT prophylaxis: heparin gtt Code Status: full  Family Communication: none at bedside Disposition Plan: pending cardiology and CT surgery sign off   Consultants:   TCTS  Cardiology  Procedures:  TEE IMPRESSIONS    1. Left ventricular ejection fraction, by visual estimation, is 25 to 30%. The left ventricle has severely decreased function. Normal left ventricular size. There is  no left ventricular hypertrophy.  2. Global right ventricle has normal systolic function.The right ventricular size is normal. No increase in right ventricular wall thickness.  3. Left atrial size was moderately dilated.  4. Right atrial size was normal.  5. The mitral valve is normal in structure. Moderate mitral valve regurgitation. No evidence of mitral stenosis.  6. PISA radius - 0.6cm     ERO - 0.17 cm2     MR regugitant volume - 103m     Vena contracta - 0.5 cm.  7. The tricuspid valve is normal in structure. Tricuspid valve regurgitation was not visualized by color flow Doppler.  8. The aortic valve is tricuspid Aortic valve regurgitation is mild by color flow Doppler. Structurally normal aortic valve, with no evidence of sclerosis or stenosis.  9. Vena contracta of AR jet is 0.2cm. 10. The pulmonic valve was normal in structure. Pulmonic valve regurgitation is trivial by color flow Doppler. 11. The inferior vena cava is normal in size with greater than 50% respiratory variability, suggesting right atrial pressure of 3 mmHg.  Echo IMPRESSIONS    1. Left ventricular ejection fraction, by visual estimation, is 20 to 25%. The left ventricle has severely decreased function. Mildly increased left ventricular size. There is mildly increased left ventricular hypertrophy.  2. There is global left ventricular hypokinesis with inferior wall akinesis.  3. Abnormal septal motion consistent with left bundle branch block.  4. Indeterminate diastolic filling due to E-Ambermarie Honeyman fusion pattern of LV diastolic filling.  5. Global right  ventricle was not assessed.The right ventricular size is normal. No increase in right ventricular wall thickness.  6. Left atrial size was mildly dilated.  7. Right atrial size was normal.  8. Mild mitral annular calcification.  9. The mitral valve is grossly normal. Moderate to severe mitral valve regurgitation. No evidence of mitral stenosis. 10. Posterior leaflet  tethering is noted. 11. The tricuspid valve is normal in structure. Tricuspid valve regurgitation is mild. 12. The aortic valve is normal in structure. Aortic valve regurgitation is mild to moderate by color flow Doppler. Structurally normal aortic valve, with no evidence of sclerosis or stenosis. 13. The pulmonic valve was grossly normal. Pulmonic valve regurgitation is not visualized by color flow Doppler. 14. Mildly elevated pulmonary artery systolic pressure.  Cath  Acute systolic heart failure with pulmonary edema, echo LVEF 25%, and normal LVEDP after diuresis.  Assume that the patient has stunned myocardium (No symptoms prior to presentation).  Severe multivessel coronary disease with 40% tubular left main.    50 followed by 60 to 70% proximal LAD tandem lesions.  Severe 80% diffuse disease in the large first diagonal.    The circumflex contains multifocal high-grade stenoses in the mid body and distal before the origin of the left PDA.  The large third obtuse marginal contains 99% stenosis.    The right coronary is codominant and is totally occluded with collaterals filling from left to right and right to right.  RECOMMENDATIONS:   After stabilization, the patient should be considered for surgical revascularization if targets are adequate.  TCTS consultation is recommended but because of the late completion of the procedure the office was closed.  Please consult in [place].m.  Antimicrobials:  Anti-infectives (From admission, onward)   None         Subjective: No complaints today Still not sure about surgery  Objective: Vitals:   11/26/18 0955 11/26/18 1000 11/26/18 1025 11/26/18 1128  BP: (!) 153/82 (!) 162/82 (!) 146/90 (!) 152/105  Pulse: 72 70 81 86  Resp: 13 (!) _0 Temp:   98.3 F (36.8 C) 98.1 F (36.7 C)  TempSrc:   Oral Oral  SpO2: 96% 94% 94% 95%  Weight:      Height:        Intake/Output Summary (Last 24 hours) at 11/26/2018 1824 Last data filed  at 11/26/2018 1523 Gross per 24 hour  Intake -  Output 825 ml  Net -825 ml   Filed Weights   11/24/18 1920 11/25/18 0450 11/26/18 0459  Weight: 89.1 kg 87.6 kg 85.8 kg    Examination:  General: No acute distress. Cardiovascular: Heart sounds show Tramaine Sauls regular rate, and rhythm. Lungs: Clear to auscultation bilaterally  Abdomen: Soft, nontender, nondistended  Neurological: Alert and oriented 3. Moves all extremities 4. Cranial nerves II through XII grossly intact. Skin: Warm and dry. No rashes or lesions. Extremities: No clubbing or cyanosis. No edema.   Data Reviewed: I have personally reviewed following labs and imaging studies  CBC: Recent Labs  Lab 11/23/18 2328  11/24/18 0106 11/24/18 0337 11/25/18 0810 11/25/18 1318 11/26/18 0158  WBC 21.1*  --   --  18.3* 16.2* 17.0* 12.5*  NEUTROABS 12.9*  --   --   --   --   --   --   HGB 16.6   < > 15.3 16.2 15.1 16.6 15.8  HCT 49.0   < > 45.0 48.7 43.4 47.6 46.7  MCV 102.1*  --   --  99.0 97.7 97.5 97.9  PLT 395  --   --  285 271 298 304   < > = values in this interval not displayed.   Basic Metabolic Panel: Recent Labs  Lab 11/23/18 2328  11/23/18 2351 11/24/18 0106 11/24/18 0327 11/24/18 0820 11/25/18 0834 11/26/18 0158  NA 139   < > 139 138 139  --  141 138  K 3.8   < > 3.7 3.3* 3.6  --  3.2* 3.3*  CL 102  --  102  --  102  --  104 98  CO2 23  --   --   --  23  --  26 28  GLUCOSE 278*  --  271*  --  192*  --  134* 136*  BUN 16  --  22  --  15  --  24* 25*  CREATININE 1.42*  --  1.30*  --  1.30*  --  1.22 1.18  CALCIUM 8.5*  --   --   --  8.8*  --  8.6* 8.8*  MG  --   --   --   --   --  2.0  --  1.9   < > = values in this interval not displayed.   GFR: Estimated Creatinine Clearance: 67.7 mL/min (by C-G formula based on SCr of 1.18 mg/dL). Liver Function Tests: Recent Labs  Lab 11/23/18 2328 11/24/18 0327 11/25/18 0834 11/26/18 0158  AST 45* 59* 33 24  ALT 30 38 29 27  ALKPHOS 89 80 70 71  BILITOT  2.2* 1.4* 1.3* 2.5*  PROT 7.0 7.0 6.7 6.9  ALBUMIN 3.8 3.9 3.5 3.8   No results for input(s): LIPASE, AMYLASE in the last 168 hours. No results for input(s): AMMONIA in the last 168 hours. Coagulation Profile: No results for input(s): INR, PROTIME in the last 168 hours. Cardiac Enzymes: No results for input(s): CKTOTAL, CKMB, CKMBINDEX, TROPONINI in the last 168 hours. BNP (last 3 results) No results for input(s): PROBNP in the last 8760 hours. HbA1C: Recent Labs    11/24/18 0337  HGBA1C 5.8*   CBG: Recent Labs  Lab 11/23/18 2326  GLUCAP 272*   Lipid Profile: Recent Labs    11/25/18 0834  CHOL 233*  HDL 31*  LDLCALC 171*  TRIG 155*  CHOLHDL 7.5   Thyroid Function Tests: Recent Labs    11/24/18 1336  TSH 0.350  FREET4 1.38*   Anemia Panel: No results for input(s): VITAMINB12, FOLATE, FERRITIN, TIBC, IRON, RETICCTPCT in the last 72 hours. Sepsis Labs: Recent Labs  Lab 11/24/18 0327  PROCALCITON <0.10    Recent Results (from the past 240 hour(s))  SARS Coronavirus 2 [facility] order, Performed in [facility] hospital lab) Nasopharyngeal Nasopharyngeal Swab     Status: None   Collection Time: 11/23/18 11:29 PM   Specimen: Nasopharyngeal Swab  Result Value Ref Range Status   SARS Coronavirus 2 NEGATIVE NEGATIVE Final    Comment: (NOTE) If result is NEGATIVE SARS-CoV-2 target nucleic acids are NOT DETECTED. The SARS-CoV-2 RNA is generally detectable in upper and lower  respiratory specimens during the acute phase of infection. The lowest  concentration of SARS-CoV-2 viral copies this assay can detect is 250  copies / mL. Daden Mahany negative result does not preclude SARS-CoV-2 infection  and should not be used as the sole basis for treatment or other  patient management decisions.  Dowell Hoon negative result may occur with  improper specimen collection / handling, submission of specimen other  than nasopharyngeal swab, presence of viral mutation(s) within the  areas targeted  by this assay, and inadequate number of viral copies  (<250 copies / mL). Jennifer Payes negative result must be combined with clinical  observations, patient history, and epidemiological information. If result is POSITIVE SARS-CoV-2 target nucleic acids are DETECTED. The SARS-CoV-2 RNA is generally detectable in upper and lower  respiratory specimens dur ing the acute phase of infection.  Positive  results are indicative of active infection with SARS-CoV-2.  Clinical  correlation with patient history and other diagnostic information is  necessary to determine patient infection status.  Positive results do  not rule out bacterial infection or co-infection with other viruses. If result is PRESUMPTIVE POSTIVE SARS-CoV-2 nucleic acids MAY BE PRESENT.   Australia Droll presumptive positive result was obtained on the submitted specimen  and confirmed on repeat testing.  While 2019 novel coronavirus  (SARS-CoV-2) nucleic acids may be present in the submitted sample  additional confirmatory testing may be necessary for epidemiological  and / or clinical management purposes  to differentiate between  SARS-CoV-2 and other Sarbecovirus currently known to infect humans.  If clinically indicated additional testing with an alternate test  methodology [phone]) is advised. The SARS-CoV-2 RNA is generally  detectable in upper and lower respiratory sp ecimens during the acute  phase of infection. The expected result is Negative. Fact Sheet for Patients:  StrictlyIdeas.no Fact Sheet for Healthcare Providers: BankingDealers.co.za This test is not yet approved or cleared by the [place] FDA and has been authorized for detection and/or diagnosis of SARS-CoV-2 by FDA under an Emergency Use Authorization (EUA).  This EUA will remain in effect (meaning this test can be used) for the duration of the COVID-19 declaration under Section 564(b)(1) of the Act, 21 U.S.C. section  360bbb-3(b)(1), unless the authorization is terminated or revoked sooner. Performed at [facility], [place] [address] [place], [place] [address]   MRSA PCR Screening     Status: None   Collection Time: 11/25/18  7:54 PM   Specimen: Nasal Mucosa; Nasopharyngeal  Result Value Ref Range Status   MRSA by PCR NEGATIVE NEGATIVE Final    Comment:        The GeneXpert MRSA Assay (FDA approved for NASAL specimens only), is one component of [facility] comprehensive MRSA colonization surveillance program. It is not intended to diagnose MRSA infection nor to guide or monitor treatment for MRSA infections. Performed at [facility], [place] [address] [place], [place] [address]          Radiology Studies: No results found.      Scheduled Meds: . aspirin  81 mg Oral Daily  . atorvastatin  80 mg Oral q1800  . carvedilol  3.125 mg Oral BID WC  . furosemide  40 mg Intravenous BID  . sodium chloride flush  3 mL Intravenous Q12H   Continuous Infusions: . sodium chloride    . heparin 1,450 Units/hr (11/26/18 0620)  . nitroGLYCERIN       LOS: 2 days    Time spent: over 30 min    Fayrene Helper, MD [facility] Pager AMION  If 7PM-7AM, please contact night-coverage [email or web address] Password [facility] 11/26/2018, 6:24 PM

## 2018-11-26 NOTE — Progress Notes (Signed)
  Echocardiogram Echocardiogram Transesophageal has been performed.  Jennette Dubin 11/26/2018, 9:57 AM

## 2018-11-26 NOTE — Interval H&P Note (Signed)
History and Physical Interval Note:  11/26/2018 8:33 AM  Curtis [name]  has presented today for surgery, with the diagnosis of AORTIC INSUFFICIENCY AND MITRAL REGURGITATION.  The various methods of treatment have been discussed with the patient and family. After consideration of risks, benefits and other options for treatment, the patient has consented to  Procedure(s): TRANSESOPHAGEAL ECHOCARDIOGRAM (TEE) (N/A) as a surgical intervention.  The patient's history has been reviewed, patient examined, no change in status, stable for surgery.  I have reviewed the patient's chart and labs.  Questions were answered to the patient's satisfaction.     [employer]

## 2018-11-26 NOTE — Care Management Important Message (Signed)
Important Message  Patient Details  Name: Curtis [name] MRN: [id number] Date of Birth: 27-Feb-1951   Medicare Important Message Given:  Yes     Curtis [name] 11/26/2018, 3:16 PM

## 2018-11-26 NOTE — CV Procedure (Signed)
   Transesophageal Echocardiogram  Indications: Evaluate mitral regurgitation and aortic insufficiency  Time out performed  During this procedure the patient is administered a total of Versed 4 mg and Fentanyl 54mcg to achieve and maintain moderate conscious sedation.  The patient's heart rate, blood pressure, and oxygen saturation are monitored continuously during the procedure. The period of conscious sedation is 27 minutes, of which I was present face-to-face 100% of this time.  Findings:  Left Ventricle: Ejection fraction 25-30 % severely reduced  Mitral Valve:   Qualitatively:   color-flow Doppler appears moderate regurgitation.    Quantitatively:  Pisa radius 0.6 cm (moderate) ERO 0.17 cm (mild) regurgitant volume 34 mL (mild to moderate)  Vena contracta -0.5 cm- (moderate)  Overall would categorize as moderate mitral regurgitation.  Aortic Valve:   Mild aortic insufficiency.  Vena contracta -0.2 cm (mild)  Tricuspid Valve: Not well visualized- no TR  Left Atrium: No left atrial appendage thrombus  Interatrial septum: No Doppler evidence of shunt  Candee Furbish, MD

## 2018-11-27 ENCOUNTER — Inpatient Hospital Stay (HOSPITAL_COMMUNITY): Payer: Medicare Other

## 2018-11-27 ENCOUNTER — Encounter (HOSPITAL_COMMUNITY): Payer: Self-pay | Admitting: Cardiology

## 2018-11-27 DIAGNOSIS — I5043 Acute on chronic combined systolic (congestive) and diastolic (congestive) heart failure: Secondary | ICD-10-CM

## 2018-11-27 DIAGNOSIS — I2510 Atherosclerotic heart disease of native coronary artery without angina pectoris: Secondary | ICD-10-CM

## 2018-11-27 DIAGNOSIS — R7989 Other specified abnormal findings of blood chemistry: Secondary | ICD-10-CM

## 2018-11-27 DIAGNOSIS — I340 Nonrheumatic mitral (valve) insufficiency: Secondary | ICD-10-CM

## 2018-11-27 DIAGNOSIS — I5042 Chronic combined systolic (congestive) and diastolic (congestive) heart failure: Secondary | ICD-10-CM

## 2018-11-27 LAB — CBC
HCT: 46.2 % (ref 39.0–52.0)
Hemoglobin: 16.4 g/dL (ref 13.0–17.0)
MCH: 34.2 pg — ABNORMAL HIGH (ref 26.0–34.0)
MCHC: 35.5 g/dL (ref 30.0–36.0)
MCV: 96.3 fL (ref 80.0–100.0)
Platelets: 262 10*3/uL (ref 150–400)
RBC: 4.8 MIL/uL (ref 4.22–5.81)
RDW: 12 % (ref 11.5–15.5)
WBC: 11 10*3/uL — ABNORMAL HIGH (ref 4.0–10.5)
nRBC: 0 % (ref 0.0–0.2)

## 2018-11-27 LAB — BASIC METABOLIC PANEL
Anion gap: 14 (ref 5–15)
BUN: 25 mg/dL — ABNORMAL HIGH (ref 8–23)
CO2: 26 mmol/L (ref 22–32)
Calcium: 8.7 mg/dL — ABNORMAL LOW (ref 8.9–10.3)
Chloride: 98 mmol/L (ref 98–111)
Creatinine, Ser: 1.11 mg/dL (ref 0.61–1.24)
GFR calc Af Amer: >60 mL/min (ref >60)
GFR calc non Af Amer: >60 mL/min (ref >60)
Glucose, Bld: 134 mg/dL — ABNORMAL HIGH (ref 70–99)
Potassium: 3.5 mmol/L (ref 3.5–5.1)
Sodium: 138 mmol/L (ref 135–145)

## 2018-11-27 LAB — HEPARIN LEVEL (UNFRACTIONATED): Heparin Unfractionated: 0.43 IU/mL (ref 0.30–0.70)

## 2018-11-27 MED ORDER — VANCOMYCIN HCL 10 G IV SOLR
1500.0000 mg | INTRAVENOUS | Status: AC
  Administered 2018-11-30: 1500 mg via INTRAVENOUS
  Filled 2018-11-27: qty 1500

## 2018-11-27 MED ORDER — POTASSIUM CHLORIDE 2 MEQ/ML IV SOLN
80.0000 meq | INTRAVENOUS | Status: DC
  Filled 2018-11-27: qty 40

## 2018-11-27 MED ORDER — POTASSIUM CHLORIDE CRYS ER 20 MEQ PO TBCR
40.0000 meq | EXTENDED_RELEASE_TABLET | Freq: Once | ORAL | Status: AC
  Administered 2018-11-27: 40 meq via ORAL
  Filled 2018-11-27: qty 2

## 2018-11-27 MED ORDER — TRANEXAMIC ACID (OHS) PUMP PRIME SOLUTION
2.0000 mg/kg | INTRAVENOUS | Status: DC
  Filled 2018-11-27: qty 1.7

## 2018-11-27 MED ORDER — DEXMEDETOMIDINE HCL IN NACL 400 MCG/100ML IV SOLN
0.1000 ug/kg/h | INTRAVENOUS | Status: AC
  Administered 2018-11-30: 0.7 ug/kg/h via INTRAVENOUS
  Filled 2018-11-27: qty 100

## 2018-11-27 MED ORDER — VANCOMYCIN HCL 10 G IV SOLR: 1250.0000 mg | INTRAVENOUS | Status: DC

## 2018-11-27 MED ORDER — SODIUM CHLORIDE 0.9 % IV SOLN
INTRAVENOUS | Status: DC
  Filled 2018-11-27: qty 30

## 2018-11-27 MED ORDER — NOREPINEPHRINE 4 MG/250ML-% IV SOLN
0.0000 ug/min | INTRAVENOUS | Status: DC
  Filled 2018-11-27: qty 250

## 2018-11-27 MED ORDER — NITROGLYCERIN IN D5W 200-5 MCG/ML-% IV SOLN
0.0000 ug/min | INTRAVENOUS | Status: DC
  Filled 2018-11-27: qty 250

## 2018-11-27 MED ORDER — TRANEXAMIC ACID (OHS) BOLUS VIA INFUSION
15.0000 mg/kg | INTRAVENOUS | Status: AC
  Administered 2018-11-30: 1278 mg via INTRAVENOUS
  Filled 2018-11-27: qty 1278

## 2018-11-27 MED ORDER — DEXMEDETOMIDINE HCL IN NACL 400 MCG/100ML IV SOLN
0.1000 ug/kg/h | INTRAVENOUS | Status: DC
  Filled 2018-11-27: qty 100

## 2018-11-27 MED ORDER — TRANEXAMIC ACID (OHS) BOLUS VIA INFUSION: 15.0000 mg/kg | INTRAVENOUS | Status: DC

## 2018-11-27 MED ORDER — SODIUM CHLORIDE 0.9 % IV SOLN
600.0000 mg | INTRAVENOUS | Status: DC
  Filled 2018-11-27: qty 600

## 2018-11-27 MED ORDER — VASOPRESSIN 20 UNIT/ML IV SOLN
0.0400 [IU]/min | INTRAVENOUS | Status: DC
  Filled 2018-11-27: qty 2

## 2018-11-27 MED ORDER — FLUCONAZOLE IN SODIUM CHLORIDE 400-0.9 MG/200ML-% IV SOLN
400.0000 mg | INTRAVENOUS | Status: DC
  Filled 2018-11-27: qty 200

## 2018-11-27 MED ORDER — VANCOMYCIN HCL 1 G IV SOLR
1000.0000 mg | INTRAVENOUS | Status: DC
  Filled 2018-11-27: qty 1000

## 2018-11-27 MED ORDER — PHENYLEPHRINE HCL-NACL 20-0.9 MG/250ML-% IV SOLN
0.0000 ug/min | INTRAVENOUS | Status: AC
  Administered 2018-11-30: 10 ug/min via INTRAVENOUS
  Filled 2018-11-27: qty 250

## 2018-11-27 MED ORDER — AMIODARONE IV BOLUS ONLY 150 MG/100ML
150.0000 mg | Freq: Once | INTRAVENOUS | Status: AC
  Administered 2018-11-27: 150 mg via INTRAVENOUS
  Filled 2018-11-27: qty 100

## 2018-11-27 MED ORDER — VANCOMYCIN HCL 10 G IV SOLR
1500.0000 mg | INTRAVENOUS | Status: DC
  Filled 2018-11-27: qty 1500

## 2018-11-27 MED ORDER — LEVOFLOXACIN IN D5W 500 MG/100ML IV SOLN
500.0000 mg | INTRAVENOUS | Status: AC
  Administered 2018-11-30: 500 mg via INTRAVENOUS
  Filled 2018-11-27: qty 100

## 2018-11-27 MED ORDER — TRANEXAMIC ACID 1000 MG/10ML IV SOLN
1.5000 mg/kg/h | INTRAVENOUS | Status: AC
  Administered 2018-11-30: 09:00:00 1.5 mg/kg/h via INTRAVENOUS
  Filled 2018-11-27: qty 25

## 2018-11-27 MED ORDER — DOPAMINE-DEXTROSE 3.2-5 MG/ML-% IV SOLN
0.0000 ug/kg/min | INTRAVENOUS | Status: DC
  Filled 2018-11-27: qty 250

## 2018-11-27 MED ORDER — PLASMA-LYTE 148 IV SOLN
INTRAVENOUS | Status: DC
  Filled 2018-11-27: qty 2.5

## 2018-11-27 MED ORDER — EPINEPHRINE HCL 5 MG/250ML IV SOLN IN NS
0.0000 ug/min | INTRAVENOUS | Status: DC
  Filled 2018-11-27: qty 250

## 2018-11-27 MED ORDER — INSULIN REGULAR(HUMAN) IN NACL 100-0.9 UT/100ML-% IV SOLN
INTRAVENOUS | Status: AC
  Administered 2018-11-30: 1 [IU]/h via INTRAVENOUS
  Filled 2018-11-27: qty 100

## 2018-11-27 MED ORDER — MANNITOL 20 % IV SOLN
Freq: Once | INTRAVENOUS | Status: DC
  Filled 2018-11-27 (×4): qty 13

## 2018-11-27 MED ORDER — PHENYLEPHRINE HCL-NACL 20-0.9 MG/250ML-% IV SOLN
30.0000 ug/min | INTRAVENOUS | Status: DC
  Filled 2018-11-27: qty 250

## 2018-11-27 MED ORDER — EPINEPHRINE HCL 5 MG/250ML IV SOLN IN NS
0.0000 ug/min | INTRAVENOUS | Status: AC
  Administered 2018-11-30: 2 ug/min via INTRAVENOUS
  Filled 2018-11-27: qty 250

## 2018-11-27 MED ORDER — MILRINONE LACTATE IN DEXTROSE 20-5 MG/100ML-% IV SOLN
0.3000 ug/kg/min | INTRAVENOUS | Status: DC
  Filled 2018-11-27: qty 100

## 2018-11-27 MED ORDER — NITROGLYCERIN IN D5W 200-5 MCG/ML-% IV SOLN
2.0000 ug/min | INTRAVENOUS | Status: DC
  Filled 2018-11-27: qty 250

## 2018-11-27 MED ORDER — DOBUTAMINE IN D5W 4-5 MG/ML-% IV SOLN
2.0000 ug/kg/min | INTRAVENOUS | Status: DC
  Filled 2018-11-27: qty 250

## 2018-11-27 MED ORDER — INSULIN REGULAR(HUMAN) IN NACL 100-0.9 UT/100ML-% IV SOLN
INTRAVENOUS | Status: DC
  Filled 2018-11-27: qty 100

## 2018-11-27 MED ORDER — AMIODARONE HCL 200 MG PO TABS
400.0000 mg | ORAL_TABLET | Freq: Two times a day (BID) | ORAL | Status: DC
  Administered 2018-11-27 – 2018-11-29 (×6): 400 mg via ORAL
  Filled 2018-11-27 (×6): qty 2

## 2018-11-27 MED ORDER — MANNITOL 20 % IV SOLN
Freq: Once | INTRAVENOUS | Status: DC
  Filled 2018-11-27: qty 13

## 2018-11-27 MED ORDER — TRANEXAMIC ACID 1000 MG/10ML IV SOLN
1.5000 mg/kg/h | INTRAVENOUS | Status: DC
  Filled 2018-11-27: qty 25

## 2018-11-27 MED ORDER — MILRINONE LACTATE IN DEXTROSE 20-5 MG/100ML-% IV SOLN
0.3000 ug/kg/min | INTRAVENOUS | Status: AC
  Administered 2018-11-30: 0.375 ug/kg/min via INTRAVENOUS
  Filled 2018-11-27: qty 100

## 2018-11-27 MED ORDER — LEVOFLOXACIN IN D5W 500 MG/100ML IV SOLN
500.0000 mg | INTRAVENOUS | Status: DC
  Filled 2018-11-27: qty 100

## 2018-11-27 NOTE — Progress Notes (Signed)
CARDIAC REHAB PHASE I   Preop education completed with pt. Pt given an IS and able to demonstrate 1750. Talked with pt about importance of IS use, walks, and sternal precautions. Pt given cardiac surgery booklet, in-tube sheet, and [facility] care guide. Pt states he has someone who will stay with him after surgery, but wouldn't say who. Pt seemed uninterested in education. Will continue to follow.  [phone] Rufina Falco, RN BSN 11/27/2018 9:33 AM

## 2018-11-27 NOTE — Progress Notes (Signed)
Progress Note  Patient Name: Curtis [name] Date of Encounter: 11/27/2018  Primary Cardiologist: Pixie Casino, MD  Subjective   Denies chest pain, SOB. Has re-thought CABG and is now willing to proceed if considered a candidate. TEE with moderate MR and mild AI  Inpatient Medications    Scheduled Meds: . aspirin  81 mg Oral Daily  . atorvastatin  80 mg Oral q1800  . carvedilol  3.125 mg Oral BID WC  . furosemide  40 mg Intravenous BID  . sodium chloride flush  3 mL Intravenous Q12H   Continuous Infusions: . sodium chloride    . heparin 1,450 Units/hr (11/27/18 0018)  . nitroGLYCERIN     PRN Meds: sodium chloride, acetaminophen, ondansetron (ZOFRAN) IV, sodium chloride flush   Vital Signs    Vitals:   11/26/18 2034 11/26/18 2100 11/27/18 0015 11/27/18 0413  BP: (!) 142/83  128/81 128/69  Pulse: 75 78 77 79  Resp: (!) 26 (!) 23 (!) 21 16  Temp: 98.2 F (36.8 C)  98.1 F (36.7 C) 98.7 F (37.1 C)  TempSrc: Oral  Oral Oral  SpO2: 95% 94% 98% 96%  Weight:    85.2 kg  Height:        Intake/Output Summary (Last 24 hours) at 11/27/2018 0657 Last data filed at 11/27/2018 0600 Gross per 24 hour  Intake -  Output 1225 ml  Net -1225 ml   Filed Weights   11/25/18 0450 11/26/18 0459 11/27/18 0413  Weight: 87.6 kg 85.8 kg 85.2 kg    Physical Exam   General: Well developed, well nourished, NAD Skin: Warm, dry, intact  Lungs:Clear to ausculation bilaterally. No wheezes, rales, or rhonchi. Breathing is unlabored. Cardiovascular: RRR with S1 S2. No murmur Abdomen: Soft, non-tender, non-distended. No obvious abdominal masses. Extremities: No edema. No clubbing or cyanosis. DPpulses 2+ bilaterally Neuro: Alert and oriented. No focal deficits. No facial asymmetry. MAE spontaneously. Psych: Responds to questions appropriately with normal affect.    Labs    Chemistry Recent Labs  Lab 11/24/18 0327 11/25/18 0834 11/26/18 0158 11/27/18 0253  NA 139 141 138 138   K 3.6 3.2* 3.3* 3.5  CL 102 104 98 98  CO2 23 26 28 26   GLUCOSE 192* 134* 136* 134*  BUN 15 24* 25* 25*  CREATININE 1.30* 1.22 1.18 1.11  CALCIUM 8.8* 8.6* 8.8* 8.7*  PROT 7.0 6.7 6.9  --   ALBUMIN 3.9 3.5 3.8  --   AST 59* 33 24  --   ALT 38 29 27  --   ALKPHOS 80 70 71  --   BILITOT 1.4* 1.3* 2.5*  --   GFRNONAA 57* >60 >60 >60  GFRAA >60 >60 >60 >60  ANIONGAP 14 11 12 14      Hematology Recent Labs  Lab 11/25/18 1318 11/26/18 0158 11/27/18 0253  WBC 17.0* 12.5* 11.0*  RBC 4.88 4.77 4.80  HGB 16.6 15.8 16.4  HCT 47.6 46.7 46.2  MCV 97.5 97.9 96.3  MCH 34.0 33.1 34.2*  MCHC 34.9 33.8 35.5  RDW 12.2 12.1 12.0  PLT 298 304 262    Cardiac EnzymesNo results for input(s): TROPONINI in the last 168 hours. No results for input(s): TROPIPOC in the last 168 hours.  BNP Recent Labs  Lab 11/23/18 2328  BNP 507.9*     DDimer No results for input(s): DDIMER in the last 168 hours.   Radiology    No results found.  Telemetry    11/27/2018  NSR HR 70-80's - Personally Reviewed  ECG    11/25/2018 with NSR and LBBB- Personally Reviewed  Cardiac Studies   ECHO:  11/24/2018 1. Left ventricular ejection fraction, by visual estimation, is 20 to 25%. The left ventricle has severely decreased function. Mildly increased left ventricular size. There is mildly increased left ventricular hypertrophy. 2. There is global left ventricular hypokinesis with inferior wall akinesis. 3. Abnormal septal motion consistent with left bundle branch block. 4. Indeterminate diastolic filling due to E-A fusion pattern of LV diastolic filling. 5. Global right ventricle was not assessed.The right ventricular size is normal. No increase in right ventricular wall thickness. 6. Left atrial size was mildly dilated. 7. Right atrial size was normal. 8. Mild mitral annular calcification. 9. The mitral valve is grossly normal. Moderate to severe mitral valve regurgitation. No evidence of  mitral stenosis. 10. Posterior leaflet tethering is noted. 11. The tricuspid valve is normal in structure. Tricuspid valve regurgitation is mild. 12. The aortic valve is normal in structure. Aortic valve regurgitation is mild to moderate by color flow Doppler. Structurally normal aortic valve, with no evidence of sclerosis or stenosis. 13. The pulmonic valve was grossly normal. Pulmonic valve regurgitation is not visualized by color flow Doppler. 14. Mildly elevated pulmonary artery systolic pressure.  CATH: 11/24/2018  Acute systolic heart failure with pulmonary edema, echo LVEF 25%, and normal LVEDP after diuresis. Assume that the patient has stunned myocardium (No symptoms prior to presentation).  Severe multivessel coronary disease with 40% tubular left main.   50 followed by 60 to 70% proximal LAD tandem lesions. Severe 80% diffuse disease in the large first diagonal.   The circumflex contains multifocal high-grade stenoses in the mid body and distal before the origin of the left PDA. The large third obtuse marginal contains 99% stenosis.   The right coronary is codominant and is totally occluded with collaterals filling from left to right and right to right.  RECOMMENDATIONS:   After stabilization, the patient should be considered for surgical revascularization if targets are adequate. TCTS consultation is recommended but because of the late completion of the procedure the office was closed. Please consult in a.m.  Patient Profile     68 y.o. male Tob use was admitted 09/22 with NSTEMI and respiratory failure, pulm edema  Assessment & Plan    1. Severe multiple vessel CAD: -Pt underwent cath 11/24/2018 which showed multi-vessel CAD with need for possible CABG -TCTS consulted who recommended TEE to evaluate cardiac valves. TEE performed 11/26/2018 which showed moderate MR and mild AI -Pt now agreeable to CABG if he is found to be a candidate.  -Awaiting TCTS final  recommendations -Continue ASA, statin, low dose carvedilol  2. Acute systolic HF/ischemic cardiomyopathy: -LVEF on echocardiogram found to be severely reduced at 20-25% with LVH and global hypokinesis with inferior wall akinesis.  -On Lasix 40mg  IV BID  -Weight, 187lb today>>>207 on presentation  -I&O, net negative 4.5L since hospital admission   3. HLD: -Last LDL, 171 -Continue high intensity statin  -Needs close follow up post CABG   4. Moderate MR/milf AI: -Per TEE performed 11/26/2018 -TCTS to follow for final recommendations on proceeding with CABG as above   Signed, 11/28/2018 NP-[facility] Pager: [phone] 11/27/2018, 6:57 AM     For questions or updates, please contact   Please consult [email or web address] for contact info under Cardiology/STEMI.

## 2018-11-27 NOTE — Progress Notes (Signed)
PROGRESS NOTE    Curtis [name]  ZOX:[id number] DOB: [birth year] DOA: 11/23/2018 PCP: Patient, No Pcp Per   Brief Narrative:  Curtis [name] 68 y.o.malewithnosignificant past medical history presenting to the hospitalvia EMS for evaluation of sudden onset shortness of breath which started at 10 PM yesterday. On EMS arrival, patient was noted to be pale and diaphoretic. Noted to be in significant distress, diaphoretic, tachycardic, and tachypneic. Oxygen saturation 60% on room air, improved to 84% with BVM. EMS reported multiple syncopal episodes in route but patient never lost pulses and stayed in sinus tachycardia. He was given epinephrine 0.3 mg, IV magnesium 2 g, and IV Solu-Medrol 125 mg in route. Patient states last night around 10 PM he woke up from his sleep feeling very short of breath. He was feeling fine during the day and was not having any difficulty breathing. Denies chest pain, cough, fevers, or chills. Denies history of hypertension or congestive heart failure. States the last time he saw Curtis [name] doctor was 7 years ago. He is currently not on any medications. No other complaints.  ED Course:Awake and alert on arrival to the ED. Tachycardic and tachypneic. Placed on BiPAP. ABG with pH 7.21, PCO2 63, and PO2 408.Systolic >811.Afebrile. White count 21.1 with left shift. BNP 507. High-sensitivity troponin 63. Heart rate persistently in the 150s in the ED and initial EKG showing narrow complex tachycardia. BUN 16, creatinine 1.4. No prior labs for comparison. T bili 2.2, AST 45. Alk phos and ALT normal. SARS-CoV-2 test negative. Chest x-ray showing mild cardiomegaly and moderate pulmonary edema. Patient received adenosine 6 mg, nitroglycerin 2% ointment, and Lasix 40 mg in the ED. Repeat ABG showing improvement with pH 7.35 and PCO2 44.Repeat EKG with sinus tachycardia, LBBB, and T wave inversions in inferior leads. No prior EKG for comparison.   Assessment & Plan:     Principal Problem:   NSTEMI (non-ST elevated myocardial infarction) ([place]) Active Problems:   Acute respiratory failure with hypoxia and hypercapnia (HCC)   Pulmonary edema   Hypertensive emergency   Elevated troponin   Leukocytosis   Hypertension   Coronary artery disease involving native heart without angina pectoris   Acute on chronic combined systolic and diastolic CHF (congestive heart failure) (HCC)  Acute hypoxic hypercapnic respiratory failure   Acute Systolic Heart Failure: new onset CHF vs flash pulm edema from HTN emergency.  No hx HTN but hasn't [name] doctor in 7 yrs. Oxygen saturation 60% on room air and required BVM ventilation. Initial ABG with pH 7.21 and PCO2 63. Placed on BiPAP and subsequent ABG showing improvement of acidosis and hypercapnia. Systolic blood pressure above 200 on arrival to the ED. BNP 507. Chest x-ray showing mild cardiomegaly and moderate pulmonary edema.Work of breathing and blood pressure improved significantly after BiPAP,IV Lasix, and Nitropaste. Weaned to RA today -Monitor closely in the progressive care unit - Continue IV Lasix 40 mg twice daily - can consider transitioning to PO over weekend - I/O, daily weights (good uop, weight downtrending).  His creatinine has improved with diuresis -Echocardiogram - with EF 20-25%, LV severely decreased function, global L ventricular hypokinesis with inferior wall akinesis -low-sodium diet with fluid restriction -Continue supplemental oxygen, wean as tolerated - Cardiology consulting, appreciate recs  Elevated troponin/ NSTEMI: concern for ACS. Initial high-sensitivity troponin 63, repeat significantly elevated at 4477 and then rising to 9016. EKG with left bundle branch block and worsening QTC prolongation (QTc 526 - more recently improved to ~470s). -Aspirin 324 mg -Heparin infusion  started -Continue to trend troponin -Check magnesium level -Cardiology c/s, appreciate recs - continue ASA, coreg  3.125 mg BID - titrate as able - LDL 171, HDL 31.  A1c 5.8.  Continue statin. - cath with severe multivessel disease (see report).  Recommended CT surgery c/s. - Appreciate CT surgery consult - planning for TEE (TEE today with EF 25-30%, mod mitral valve regurgitation, aortic valve without sclerosis or stenosis - see repor) , PFT's (severe obstructive airway disease), LE arterial duplex, CT chest with ectatic ascending thoracic aorta, mild aortic atherosclerotic calcifications, mild pericardial fluid vs thickening, , mild centrilobular pulmonary emphysema and diffuse mild bronchial wall thickening (see report). - Per Dr. Orvan [name], planning for CABG +/- mitral surgery on Monday.  Recommended considering loading with amiodarone over weekend.  Will defer this to CT surgery/cardiology.     HTN urgency: BP's 180/ 105 to > 200's on presentation. Has not [name] doctor in 7 years.  BP's down w/ IV ntg and lasix.  - follow serial BP's - improved today - cardiology following will hold off on initiating BP meds as pt is pending ischemia/ CHF work-up  Renal Insufficiency BUN 16, creatinine 1.4 on presentation. No prior labs for comparison.  - improved to 1.11 today -Repeat BMP in Curtis [name].m. -Monitor urine output - check UA (RBC's - will need outpatient follow up)  Hyperglycemia Random blood glucose 278. -Check A1c 5.8 -continue to monitor - consider adding SSI if hyperglycemia  Leukocytosis Afebrile. White count 21.1 with left shift. Dyspnea was acute onset and chest x-ray with findings consistent with pulmonary edema. SARS-CoV-2 test negative. Not endorsing any infectious symptoms. -Improving, continue to follow WBC count -Check procalcitonin level (wnl) -UA pending (not c/w UTI)  Tachycardia Heart rate persistently in the 150s and initial EKG showing narrow complex tachycardia.Patient received adenosine 6 mg in the ED. Repeat EKG showing sinus tachycardia.  - Heart rate currently improved  and in the 80s to 90s. -Cardiac monitoring -Check TSH (wnl), free T4 levels (elevated to 1.38) - would repeat outpatient  Elevated T bili T bili 2.2, no significant elevation of remainder of LFTs. - Fluctuating, continue to monitor (2.5 most recently)   DVT prophylaxis: heparin gtt Code Status: full  Family Communication: none at bedside Disposition Plan: pending cardiology and CT surgery sign off   Consultants:   TCTS  Cardiology  Procedures:  TEE IMPRESSIONS    1. Left ventricular ejection fraction, by visual estimation, is 25 to 30%. The left ventricle has severely decreased function. Normal left ventricular size. There is no left ventricular hypertrophy.  2. Global right ventricle has normal systolic function.The right ventricular size is normal. No increase in right ventricular wall thickness.  3. Left atrial size was moderately dilated.  4. Right atrial size was normal.  5. The mitral valve is normal in structure. Moderate mitral valve regurgitation. No evidence of mitral stenosis.  6. PISA radius - 0.6cm     ERO - 0.17 cm2     MR regugitant volume - 29m     Vena contracta - 0.5 cm.  7. The tricuspid valve is normal in structure. Tricuspid valve regurgitation was not visualized by color flow Doppler.  8. The aortic valve is tricuspid Aortic valve regurgitation is mild by color flow Doppler. Structurally normal aortic valve, with no evidence of sclerosis or stenosis.  9. Vena contracta of AR jet is 0.2cm. 10. The pulmonic valve was normal in structure. Pulmonic valve regurgitation is trivial by color flow Doppler. 11. The  inferior vena cava is normal in size with greater than 50% respiratory variability, suggesting right atrial pressure of 3 mmHg.  Echo IMPRESSIONS    1. Left ventricular ejection fraction, by visual estimation, is 20 to 25%. The left ventricle has severely decreased function. Mildly increased left ventricular size. There is mildly increased  left ventricular hypertrophy.  2. There is global left ventricular hypokinesis with inferior wall akinesis.  3. Abnormal septal motion consistent with left bundle branch block.  4. Indeterminate diastolic filling due to E-Jaquesha Boroff fusion pattern of LV diastolic filling.  5. Global right ventricle was not assessed.The right ventricular size is normal. No increase in right ventricular wall thickness.  6. Left atrial size was mildly dilated.  7. Right atrial size was normal.  8. Mild mitral annular calcification.  9. The mitral valve is grossly normal. Moderate to severe mitral valve regurgitation. No evidence of mitral stenosis. 10. Posterior leaflet tethering is noted. 11. The tricuspid valve is normal in structure. Tricuspid valve regurgitation is mild. 12. The aortic valve is normal in structure. Aortic valve regurgitation is mild to moderate by color flow Doppler. Structurally normal aortic valve, with no evidence of sclerosis or stenosis. 13. The pulmonic valve was grossly normal. Pulmonic valve regurgitation is not visualized by color flow Doppler. 14. Mildly elevated pulmonary artery systolic pressure.  Cath  Acute systolic heart failure with pulmonary edema, echo LVEF 25%, and normal LVEDP after diuresis.  Assume that the patient has stunned myocardium (No symptoms prior to presentation).  Severe multivessel coronary disease with 40% tubular left main.    50 followed by 60 to 70% proximal LAD tandem lesions.  Severe 80% diffuse disease in the large first diagonal.    The circumflex contains multifocal high-grade stenoses in the mid body and distal before the origin of the left PDA.  The large third obtuse marginal contains 99% stenosis.    The right coronary is codominant and is totally occluded with collaterals filling from left to right and right to right.  RECOMMENDATIONS:   After stabilization, the patient should be considered for surgical revascularization if targets are adequate.   TCTS consultation is recommended but because of the late completion of the procedure the office was closed.  Please consult in Kdyn [email or web address].  Antimicrobials:  Anti-infectives (From admission, onward)   Start     Dose/Rate Route Frequency Ordered Stop   11/30/18 0400  vancomycin (VANCOCIN) 1,250 mg in sodium chloride 0.9 % 250 mL IVPB  Status:  Discontinued     1,250 mg 166.7 mL/hr over 90 Minutes Intravenous To Surgery 11/27/18 0819 11/27/18 0823   11/30/18 0400  levofloxacin (LEVAQUIN) IVPB 500 mg  Status:  Discontinued     500 mg 100 mL/hr over 60 Minutes Intravenous To Surgery 11/27/18 0819 11/27/18 1035   11/30/18 0400  vancomycin (VANCOCIN) 1,500 mg in sodium chloride 0.9 % 250 mL IVPB  Status:  Discontinued     1,500 mg 125 mL/hr over 120 Minutes Intravenous To Surgery 11/27/18 0823 11/27/18 1035   11/30/18 0400  vancomycin (VANCOCIN) 1,500 mg in sodium chloride 0.9 % 250 mL IVPB     1,500 mg 125 mL/hr over 120 Minutes Intravenous To Surgery 11/27/18 1040 12/01/18 0400   11/30/18 0400  fluconazole (DIFLUCAN) IVPB 400 mg     400 mg 100 mL/hr over 120 Minutes Intravenous To Surgery 11/27/18 1040 12/01/18 0400   11/30/18 0400  rifampin (RIFADIN) 600 mg in sodium chloride 0.9 % 100 mL IVPB  600 mg 200 mL/hr over 30 Minutes Intravenous To Surgery 11/27/18 1040 12/01/18 0400   11/30/18 0400  vancomycin (VANCOCIN) powder 1,000 mg     1,000 mg Other To Surgery 11/27/18 1040 12/01/18 0400   11/30/18 0400  levofloxacin (LEVAQUIN) IVPB 500 mg     500 mg 100 mL/hr over 60 Minutes Intravenous To Surgery 11/27/18 1040 12/01/18 0400         Subjective: Agreeable to surgery today No complaints  Objective: Vitals:   11/27/18 0811 11/27/18 0920 11/27/18 1200 11/27/18 1248  BP: (!) 142/85 (!) 154/69  (!) 143/78  Pulse: 68 79 81 71  Resp: _0 Temp: 98.4 F (36.9 C)   98.3 F (36.8 C)  TempSrc: Oral   Oral  SpO2: 96% 94%  97%  Weight:      Height:        Intake/Output Summary  (Last 24 hours) at 11/27/2018 1249 Last data filed at 11/27/2018 0900 Gross per 24 hour  Intake 236 ml  Output 1225 ml  Net -989 ml   Filed Weights   11/25/18 0450 11/26/18 0459 11/27/18 0413  Weight: 87.6 kg 85.8 kg 85.2 kg    Examination:  General: No acute distress. Cardiovascular: Heart sounds show Cosandra Plouffe regular rate, and rhythm Lungs: Clear to auscultation bilaterally Abdomen: Soft, nontender, nondistended Neurological: Alert and oriented 3. Moves all extremities 4. Cranial nerves II through XII grossly intact. Skin: Warm and dry. No rashes or lesions. Extremities: No clubbing or cyanosis. No edema.   Data Reviewed: I have personally reviewed following labs and imaging studies  CBC: Recent Labs  Lab 11/23/18 2328  11/24/18 0337 11/25/18 0810 11/25/18 1318 11/26/18 0158 11/27/18 0253  WBC 21.1*  --  18.3* 16.2* 17.0* 12.5* 11.0*  NEUTROABS 12.9*  --   --   --   --   --   --   HGB 16.6   < > 16.2 15.1 16.6 15.8 16.4  HCT 49.0   < > 48.7 43.4 47.6 46.7 46.2  MCV 102.1*  --  99.0 97.7 97.5 97.9 96.3  PLT 395  --  285 271 298 304 262   < > = values in this interval not displayed.   Basic Metabolic Panel: Recent Labs  Lab 11/23/18 2328  11/23/18 2351 11/24/18 0106 11/24/18 0327 11/24/18 0820 11/25/18 0834 11/26/18 0158 11/27/18 0253  NA 139   < > 139 138 139  --  141 138 138  K 3.8   < > 3.7 3.3* 3.6  --  3.2* 3.3* 3.5  CL 102  --  102  --  102  --  104 98 98  CO2 23  --   --   --  23  --  _1 GLUCOSE 278*  --  271*  --  192*  --  134* 136* 134*  BUN 16  --  22  --  15  --  24* 25* 25*  CREATININE 1.42*  --  1.30*  --  1.30*  --  1.22 1.18 1.11  CALCIUM 8.5*  --   --   --  8.8*  --  8.6* 8.8* 8.7*  MG  --   --   --   --   --  2.0  --  1.9  --    < > = values in this interval not displayed.   GFR: Estimated Creatinine Clearance: 71.8 mL/min (by C-G formula based on SCr of 1.11 mg/dL).  Liver Function Tests: Recent Labs  Lab 11/23/18 2328  11/24/18 0327 11/25/18 0834 11/26/18 0158  AST 45* 59* 33 24  ALT 30 38 29 27  ALKPHOS 89 80 70 71  BILITOT 2.2* 1.4* 1.3* 2.5*  PROT 7.0 7.0 6.7 6.9  ALBUMIN 3.8 3.9 3.5 3.8   No results for input(s): LIPASE, AMYLASE in the last 168 hours. No results for input(s): AMMONIA in the last 168 hours. Coagulation Profile: No results for input(s): INR, PROTIME in the last 168 hours. Cardiac Enzymes: No results for input(s): CKTOTAL, CKMB, CKMBINDEX, TROPONINI in the last 168 hours. BNP (last 3 results) No results for input(s): PROBNP in the last 8760 hours. HbA1C: No results for input(s): HGBA1C in the last 72 hours. CBG: Recent Labs  Lab 11/23/18 2326  GLUCAP 272*   Lipid Profile: Recent Labs    11/25/18 0834  CHOL 233*  HDL 31*  LDLCALC 171*  TRIG 155*  CHOLHDL 7.5   Thyroid Function Tests: Recent Labs    11/24/18 1336  TSH 0.350  FREET4 1.38*   Anemia Panel: No results for input(s): VITAMINB12, FOLATE, FERRITIN, TIBC, IRON, RETICCTPCT in the last 72 hours. Sepsis Labs: Recent Labs  Lab 11/24/18 0327  PROCALCITON <0.10    Recent Results (from the past 240 hour(s))  SARS Coronavirus 2 [facility] order, Performed in [facility] hospital lab) Nasopharyngeal Nasopharyngeal Swab     Status: None   Collection Time: 11/23/18 11:29 PM   Specimen: Nasopharyngeal Swab  Result Value Ref Range Status   SARS Coronavirus 2 NEGATIVE NEGATIVE Final    Comment: (NOTE) If result is NEGATIVE SARS-CoV-2 target nucleic acids are NOT DETECTED. The SARS-CoV-2 RNA is generally detectable in upper and lower  respiratory specimens during the acute phase of infection. The lowest  concentration of SARS-CoV-2 viral copies this assay can detect is 250  copies / mL. Jayleena Stille negative result does not preclude SARS-CoV-2 infection  and should not be used as the sole basis for treatment or other  patient management decisions.  Gerardine Peltz negative result may occur with  improper specimen collection /  handling, submission of specimen other  than nasopharyngeal swab, presence of viral mutation(s) within the  areas targeted by this assay, and inadequate number of viral copies  (<250 copies / mL). Jamarquis Crull negative result must be combined with clinical  observations, patient history, and epidemiological information. If result is POSITIVE SARS-CoV-2 target nucleic acids are DETECTED. The SARS-CoV-2 RNA is generally detectable in upper and lower  respiratory specimens dur ing the acute phase of infection.  Positive  results are indicative of active infection with SARS-CoV-2.  Clinical  correlation with patient history and other diagnostic information is  necessary to determine patient infection status.  Positive results do  not rule out bacterial infection or co-infection with other viruses. If result is PRESUMPTIVE POSTIVE SARS-CoV-2 nucleic acids MAY BE PRESENT.   Avalynn Bowe presumptive positive result was obtained on the submitted specimen  and confirmed on repeat testing.  While 2019 novel coronavirus  (SARS-CoV-2) nucleic acids may be present in the submitted sample  additional confirmatory testing may be necessary for epidemiological  and / or clinical management purposes  to differentiate between  SARS-CoV-2 and other Sarbecovirus currently known to infect humans.  If clinically indicated additional testing with an alternate test  methodology [phone]) is advised. The SARS-CoV-2 RNA is generally  detectable in upper and lower respiratory sp ecimens during the acute  phase of infection. The expected result is Negative. Fact  Sheet for Patients:  StrictlyIdeas.no Fact Sheet for Healthcare Providers: BankingDealers.co.za This test is not yet approved or cleared by the [place] FDA and has been authorized for detection and/or diagnosis of SARS-CoV-2 by FDA under an Emergency Use Authorization (EUA).  This EUA will remain in effect (meaning this  test can be used) for the duration of the COVID-19 declaration under Section 564(b)(1) of the Act, 21 U.S.C. section 360bbb-3(b)(1), unless the authorization is terminated or revoked sooner. Performed at [facility], [place] [address], [place], [place] [address]   MRSA PCR Screening     Status: None   Collection Time: 11/25/18  7:54 PM   Specimen: Nasal Mucosa; Nasopharyngeal  Result Value Ref Range Status   MRSA by PCR NEGATIVE NEGATIVE Final    Comment:        The GeneXpert MRSA Assay (FDA approved for NASAL specimens only), is one component of Saher Davee comprehensive MRSA colonization surveillance program. It is not intended to diagnose MRSA infection nor to guide or monitor treatment for MRSA infections. Performed at [facility], [place] [address] [place], [place] [address]          Radiology Studies: Ct Chest Wo Contrast  Result Date: 11/27/2018 CLINICAL DATA:  68 year old male undergoing preoperative evaluation prior to CABG. Assess for aortic disease. EXAM: CT CHEST WITHOUT CONTRAST TECHNIQUE: Multidetector CT imaging of the chest was performed following the standard protocol without IV contrast. COMPARISON:  None. FINDINGS: Cardiovascular: Limited evaluation in the absence of intravenous contrast. Two vessel aortic arch. The right brachiocephalic and left common carotid artery arise from Kemani Demarais common origin. The aortic root is normal in caliber. The ascending thoracic aorta is mildly ectatic at 3.7 cm. Mild atherosclerotic calcifications are present throughout the aorta. Extensive coronary artery disease with calcifications throughout all visualized coronary arteries. Mild cardiomegaly. Trace pericardial fluid versus thickening anteriorly. The pulmonary artery is normal in caliber. Mediastinum/Nodes: Unremarkable CT appearance of the thyroid gland. No suspicious mediastinal or hilar adenopathy. No soft tissue mediastinal mass. The thoracic esophagus is unremarkable. Nonspecific  0.8 cm paraesophageal lymph node. Lungs/Pleura: Mild centrilobular pulmonary emphysema. Diffuse mild bronchial wall thickening. Mild dependent atelectasis. Tiny calcified subpleural granuloma in the periphery of the left upper lobe noted incidentally. This is Ambers Iyengar benign finding. Upper Abdomen: Approximately 6 mm low-attenuation lesion in the medial hepatic dome is incompletely characterized in the absence of intravenous contrast. Statistically, this is likely Jamela Cumbo benign cyst. No acute abnormality within the visualized upper abdomen. Musculoskeletal: No acute fracture or aggressive appearing lytic or blastic osseous lesion. Multilevel degenerative endplate spurring. IMPRESSION: 1. Ectatic ascending thoracic aorta with Sharnell Knight maximal diameter of 3.7 cm. 2. Scattered mild aortic atherosclerotic calcifications. Aortic Atherosclerosis (ICD10-170.0) 3. Mild pericardial fluid versus thickening in the anterior pericardium. 4. Common origin of the right brachiocephalic and left common carotid arteries (commonly called Danayah Smyre bovine arch). 5. Mild centrilobular pulmonary emphysema and diffuse mild bronchial wall thickening suggests underlying COPD. 6. Additional ancillary findings as above. Electronically Signed   By: Jacqulynn Cadet M.D.   On: 11/27/2018 09:57        Scheduled Meds:  aspirin  81 mg Oral Daily   atorvastatin  80 mg Oral q1800   carvedilol  3.125 mg Oral BID WC   [START ON 11/30/2018] epinephrine  0-10 mcg/min Intravenous To OR   furosemide  40 mg Intravenous BID   [START ON 11/30/2018] heparin-papaverine-plasmalyte irrigation   Irrigation To OR   [START ON 11/30/2018] insulin  Intravenous To OR   [START ON 11/30/2018] [place] Blood Cardioplegia vial (lidocaine/magnesium/mannitol 0.26g-4g-6.4g)   Intracoronary Once   [START ON 11/30/2018] phenylephrine  0-100 mcg/min Intravenous To OR   [START ON 11/30/2018] potassium chloride  80 mEq Other To OR   sodium chloride flush  3 mL Intravenous Q12H    [START ON 11/30/2018] tranexamic acid  15 mg/kg Intravenous To OR   [START ON 11/30/2018] tranexamic acid  2 mg/kg Intracatheter To OR   [START ON 11/30/2018] vancomycin  1,000 mg Other To OR   Continuous Infusions:  sodium chloride     [START ON 11/30/2018] dexmedetomidine     [START ON 11/30/2018] DOBUTamine     [START ON 11/30/2018] DOPamine     [START ON 11/30/2018] fluconazole (DIFLUCAN) IV     [START ON 11/30/2018] heparin 30,000 units/NS 1000 mL solution for CELLSAVER     heparin 1,450 Units/hr (11/27/18 0018)   [START ON 11/30/2018] levofloxacin (LEVAQUIN) IV     [START ON 11/30/2018] milrinone     nitroGLYCERIN     [START ON 11/30/2018] nitroGLYCERIN     [START ON 11/30/2018] norepinephrine (LEVOPHED) Adult infusion     [START ON 11/30/2018] rifampin (RIFADIN) IVPB     [START ON 11/30/2018] tranexamic acid (CYKLOKAPRON) infusion ([facility])     [START ON 11/30/2018] vancomycin     [START ON 11/30/2018] vasopressin (PITRESSIN) infusion - *FOR SHOCK*       LOS: 3 days    Time spent: over 30 min    Fayrene Helper, MD [facility] Pager AMION  If 7PM-7AM, please contact night-coverage [email or web address] Password [facility] 11/27/2018, 12:49 PM

## 2018-11-27 NOTE — Progress Notes (Signed)
ANTICOAGULATION CONSULT NOTE - Follow Up Consult  Pharmacy Consult for Heparin Indication: 3VCAD  Allergies  Allergen Reactions  . Penicillins Rash    Did it involve swelling of the face/tongue/throat, SOB, or low BP? Unknown Did it involve sudden or severe rash/hives, skin peeling, or any reaction on the inside of your mouth or nose? Yes Did you need to seek medical attention at a hospital or doctor's office? Unknown When did it last happen? childhood If all above answers are "NO", may proceed with cephalosporin use.     Patient Measurements: Height: 5\' 9"  (175.3 cm) Weight: 187 lb 13.3 oz (85.2 kg) IBW/kg (Calculated) : 70.7 Heparin Dosing Weight:    Vital Signs: Temp: 98.4 F (36.9 C) (09/25 0811) Temp Source: Oral (09/25 0811) BP: 154/69 (09/25 0920) Pulse Rate: 81 (09/25 1200)  Labs: Recent Labs    11/25/18 0834 11/25/18 1318 11/25/18 1423 11/26/18 0158 11/27/18 0253  HGB  --  16.6  --  15.8 16.4  HCT  --  47.6  --  46.7 46.2  PLT  --  298  --  304 262  HEPARINUNFRC  --   --  0.45 0.34 0.43  CREATININE 1.22  --   --  1.18 1.11    Estimated Creatinine Clearance: 71.8 mL/min (by C-G formula based on SCr of 1.11 mg/dL).   Assessment:  Anticoag: ACS. Heparin resumed post-cath 9/22. Cath: EF 25%. Severe multivessel CAD.  TCTS consulted for CABG evaluation. CBC wnl stable, no bleeding reported. HL 0.43 in goal. TEE done 9/24 to eval mitral regurg and aortic insufficiency, EF 25-30% severely reduced  Goal of Therapy:  Heparin level 0.3-0.7 units/ml Monitor platelets by anticoagulation protocol: Yes   Plan:  Continue Heparin infusion 1450 units/hr.  Daily Heparin level and CBC while on therapy.  CABG, possible MVR on Monday   Nijae Doyel S. Alford Highland, PharmD, [place] Clinical Staff Pharmacist Eilene Ghazi Stillinger 11/27/2018,12:48 PM

## 2018-11-27 NOTE — Research (Signed)
  Presented [employer] research study information to patient. Patient is not interested in participating in the study at this time.

## 2018-11-27 NOTE — Progress Notes (Signed)
1 Day Post-Op Procedure(s) (LRB): TRANSESOPHAGEAL ECHOCARDIOGRAM (TEE) (N/A) Subjective: No complaints  Objective: Vital signs in last 24 hours: Temp:  [97.7 F (36.5 C)-98.7 F (37.1 C)] 98.4 F (36.9 C) (09/25 0811) Pulse Rate:  [68-88] 68 (09/25 0811) Cardiac Rhythm: Normal sinus rhythm;Bundle branch block (09/25 0701) Resp:  [13-26] 17 (09/25 0811) BP: (128-181)/(69-105) 142/85 (09/25 0811) SpO2:  [94 %-98 %] 96 % (09/25 0811) FiO2 (%):  [2 %] 2 % (09/24 1000) Weight:  [85.2 kg] 85.2 kg (09/25 0413)  Hemodynamic parameters for last 24 hours:    Intake/Output from previous day: 09/24 0701 - 09/25 0700 In: -  Out: 1225 [Urine:1225] Intake/Output this shift: No intake/output data recorded.  General appearance: alert, cooperative and no distress Neurologic: intact Heart: regular rate and rhythm, S1, S2 normal, no murmur, click, rub or gallop Lungs: rales bibasilar Extremities: extremities normal, atraumatic, no cyanosis or edema  Lab Results: Recent Labs    11/26/18 0158 11/27/18 0253  WBC 12.5* 11.0*  HGB 15.8 16.4  HCT 46.7 46.2  PLT 304 262   BMET:  Recent Labs    11/26/18 0158 11/27/18 0253  NA 138 138  K 3.3* 3.5  CL 98 98  CO2 28 26  GLUCOSE 136* 134*  BUN 25* 25*  CREATININE 1.18 1.11  CALCIUM 8.8* 8.7*    PT/INR: No results for input(s): LABPROT, INR in the last 72 hours. ABG    Component Value Date/Time   PHART 7.353 11/24/2018 0106   HCO3 24.6 11/24/2018 0106   TCO2 26 11/24/2018 0106   ACIDBASEDEF 1.0 11/24/2018 0106   O2SAT 100.0 11/24/2018 0106   CBG (last 3)  No results for input(s): GLUCAP in the last 72 hours.  Assessment/Plan: S/P Procedure(s) (LRB): TRANSESOPHAGEAL ECHOCARDIOGRAM (TEE) (N/A) I have had several discussions with the patient about CABG +/- Mitral surgery. Based on TEE, more than likely, we can limit this to CABG. Tentatively scheduled for CABG, possible MVR on Monday. Would consider loading with amiodarone over  the weekend, as he is at very high risk for postoperative atrial fibrillation which would be more impactful given reduced LV-EF.    LOS: 3 days    Wonda Olds 11/27/2018

## 2018-11-28 ENCOUNTER — Other Ambulatory Visit (HOSPITAL_COMMUNITY): Payer: Medicare Other

## 2018-11-28 ENCOUNTER — Encounter (HOSPITAL_COMMUNITY): Payer: Medicare Other

## 2018-11-28 LAB — GLUCOSE, CAPILLARY
Glucose-Capillary: 109 mg/dL — ABNORMAL HIGH (ref 70–99)
Glucose-Capillary: 139 mg/dL — ABNORMAL HIGH (ref 70–99)

## 2018-11-28 LAB — CBC
HCT: 47.6 % (ref 39.0–52.0)
Hemoglobin: 16.3 g/dL (ref 13.0–17.0)
MCH: 33.1 pg (ref 26.0–34.0)
MCHC: 34.2 g/dL (ref 30.0–36.0)
MCV: 96.7 fL (ref 80.0–100.0)
Platelets: 286 10*3/uL (ref 150–400)
RBC: 4.92 MIL/uL (ref 4.22–5.81)
RDW: 12 % (ref 11.5–15.5)
WBC: 13.6 10*3/uL — ABNORMAL HIGH (ref 4.0–10.5)
nRBC: 0 % (ref 0.0–0.2)

## 2018-11-28 LAB — BASIC METABOLIC PANEL
Anion gap: 12 (ref 5–15)
BUN: 26 mg/dL — ABNORMAL HIGH (ref 8–23)
CO2: 25 mmol/L (ref 22–32)
Calcium: 8.8 mg/dL — ABNORMAL LOW (ref 8.9–10.3)
Chloride: 99 mmol/L (ref 98–111)
Creatinine, Ser: 1.24 mg/dL (ref 0.61–1.24)
GFR calc Af Amer: >60 mL/min (ref >60)
GFR calc non Af Amer: >60 mL/min (ref >60)
Glucose, Bld: 149 mg/dL — ABNORMAL HIGH (ref 70–99)
Potassium: 3.4 mmol/L — ABNORMAL LOW (ref 3.5–5.1)
Sodium: 136 mmol/L (ref 135–145)

## 2018-11-28 LAB — HEPARIN LEVEL (UNFRACTIONATED): Heparin Unfractionated: 0.4 IU/mL (ref 0.30–0.70)

## 2018-11-28 MED ORDER — INSULIN ASPART 100 UNIT/ML ~~LOC~~ SOLN
0.0000 [IU] | Freq: Three times a day (TID) | SUBCUTANEOUS | Status: DC
  Administered 2018-11-29: 2 [IU] via SUBCUTANEOUS
  Administered 2018-11-29: 14:00:00 3 [IU] via SUBCUTANEOUS

## 2018-11-28 MED ORDER — INSULIN ASPART 100 UNIT/ML ~~LOC~~ SOLN: 0.0000 [IU] | Freq: Every day | SUBCUTANEOUS | Status: DC

## 2018-11-28 MED ORDER — POTASSIUM CHLORIDE CRYS ER 20 MEQ PO TBCR
40.0000 meq | EXTENDED_RELEASE_TABLET | Freq: Once | ORAL | Status: AC
  Administered 2018-11-28: 40 meq via ORAL
  Filled 2018-11-28: qty 2

## 2018-11-28 MED ORDER — FUROSEMIDE 40 MG PO TABS
40.0000 mg | ORAL_TABLET | Freq: Two times a day (BID) | ORAL | Status: DC
  Administered 2018-11-28 – 2018-11-29 (×2): 40 mg via ORAL
  Filled 2018-11-28 (×2): qty 1

## 2018-11-28 NOTE — Progress Notes (Signed)
ANTICOAGULATION CONSULT NOTE - Follow Up Consult  Pharmacy Consult for Heparin Indication: NSTEMI  Allergies  Allergen Reactions  . Penicillins Rash    Did it involve swelling of the face/tongue/throat, SOB, or low BP? Unknown Did it involve sudden or severe rash/hives, skin peeling, or any reaction on the inside of your mouth or nose? Yes Did you need to seek medical attention at a hospital or doctor's office? Unknown When did it last happen? childhood If all above answers are "NO", may proceed with cephalosporin use.     Patient Measurements: Height: 5\' 9"  (175.3 cm) Weight: 185 lb 9.6 oz (84.2 kg) IBW/kg (Calculated) : 70.7 Heparin Dosing Weight:  88.6 kg  Vital Signs: Temp: 98.3 F (36.8 C) (09/26 0820) Temp Source: Oral (09/26 0820) BP: 144/84 (09/26 0820) Pulse Rate: 74 (09/26 0820)  Labs: Recent Labs    11/26/18 0158 11/27/18 0253 11/28/18 0206  HGB 15.8 16.4 16.3  HCT 46.7 46.2 47.6  PLT 304 262 286  HEPARINUNFRC 0.34 0.43 0.40  CREATININE 1.18 1.11 1.24    Estimated Creatinine Clearance: 59.4 mL/min (by C-G formula based on SCr of 1.24 mg/dL).   Assessment: ACS. Heparin resumed post-cath 9/22. Cath: EF 25%. Severe multivessel CAD. TCTS consulted for CABG evaluation. No anticoagulation pta.   Heparin level today is therapeutic at 0.40 on 1450 units/hour. H&H is stable at 16.3/47.6, plts wnl.    Goal of Therapy:  Heparin level 0.3-0.7 units/ml Monitor platelets by anticoagulation protocol: Yes   Plan:  Continue Heparin infusion 1450 units/hr.  Daily Heparin level and CBC while on therapy.  CABG +/- MVR on Monday F/u change to oral anticoagulant   Thank you,   Eddie Candle, PharmD PGY-1 Pharmacy Resident   Please check amion for clinical pharmacist contact number

## 2018-11-28 NOTE — Progress Notes (Signed)
PROGRESS NOTE    Curtis [name]  ZYS:[id number] DOB: 26-Apr-1950 DOA: 11/23/2018 PCP: Patient, No Pcp Per   Brief Narrative:  Curtis [name] Curtis [name] 68 y.o.malewithnosignificant past medical history presenting to the hospitalvia EMS for evaluation of sudden onset shortness of breath which started at 10 PM yesterday. On EMS arrival, patient was noted to be pale and diaphoretic. Noted to be in significant distress, diaphoretic, tachycardic, and tachypneic. Oxygen saturation 60% on room air, improved to 84% with BVM. EMS reported multiple syncopal episodes in route but patient never lost pulses and stayed in sinus tachycardia. He was given epinephrine 0.3 mg, IV magnesium 2 g, and IV Solu-Medrol 125 mg in route. Patient states last night around 10 PM he woke up from his sleep feeling very short of breath. He was feeling fine during the day and was not having any difficulty breathing. Denies chest pain, cough, fevers, or chills. Denies history of hypertension or congestive heart failure. States the last time he saw Curtis [name] doctor was 7 years ago. He is currently not on any medications. No other complaints.  ED Course:Awake and alert on arrival to the ED. Tachycardic and tachypneic. Placed on BiPAP. ABG with pH 7.21, PCO2 63, and PO2 408.Systolic >932.Afebrile. White count 21.1 with left shift. BNP 507. High-sensitivity troponin 63. Heart rate persistently in the 150s in the ED and initial EKG showing narrow complex tachycardia. BUN 16, creatinine 1.4. No prior labs for comparison. T bili 2.2, AST 45. Alk phos and ALT normal. SARS-CoV-2 test negative. Chest x-ray showing mild cardiomegaly and moderate pulmonary edema. Patient received adenosine 6 mg, nitroglycerin 2% ointment, and Lasix 40 mg in the ED. Repeat ABG showing improvement with pH 7.35 and PCO2 44.Repeat EKG with sinus tachycardia, LBBB, and T wave inversions in inferior leads. No prior EKG for comparison.   Assessment & Plan:     Principal Problem:   NSTEMI (non-ST elevated myocardial infarction) ([place]) Active Problems:   Acute respiratory failure with hypoxia and hypercapnia (HCC)   Pulmonary edema   Hypertensive emergency   Elevated troponin   Leukocytosis   Hypertension   Coronary artery disease involving native heart without angina pectoris   Acute on chronic combined systolic and diastolic CHF (congestive heart failure) (HCC)  Acute hypoxic hypercapnic respiratory failure   Acute Systolic Heart Failure: new onset CHF vs flash pulm edema from HTN emergency.  No hx HTN but hasn't seen doctor in 7 yrs. Oxygen saturation 60% on room air and required BVM ventilation. Initial ABG with pH 7.21 and PCO2 63. Placed on BiPAP and subsequent ABG showing improvement of acidosis and hypercapnia. Systolic blood pressure above 200 on arrival to the ED. BNP 507. Chest x-ray showing mild cardiomegaly and moderate pulmonary edema.Work of breathing and blood pressure improved significantly after BiPAP,IV Lasix, and Nitropaste. Weaned to RA. -Monitor closely in the progressive care unit - Will transition to lasix 40 mg PO BID - I/O, daily weights (good uop, weight downtrending).  -Echocardiogram - with EF 20-25%, LV severely decreased function, global L ventricular hypokinesis with inferior wall akinesis -low-sodium diet with fluid restriction -Continue supplemental oxygen, wean as tolerated - Cardiology consulting, appreciate recs  Elevated troponin/ NSTEMI: concern for ACS. Initial high-sensitivity troponin 63, repeat significantly elevated at 4477 and then rising to 9016. EKG with left bundle branch block and worsening QTC prolongation (QTc 526 - more recently improved to ~470s). -Aspirin 324 mg -Heparin infusion started -Continue to trend troponin -Check magnesium level -Cardiology c/s, appreciate recs - continue  ASA, coreg 3.125 mg BID - titrate as able - LDL 171, HDL 31.  A1c 5.8.  Continue statin. - cath with  severe multivessel disease (see report).  Recommended CT surgery c/s. - Appreciate CT surgery consult - planning for TEE (TEE today with EF 25-30%, mod mitral valve regurgitation, aortic valve without sclerosis or stenosis - see repor) , PFT's (severe obstructive airway disease), LE arterial duplex, CT chest with ectatic ascending thoracic aorta, mild aortic atherosclerotic calcifications, mild pericardial fluid vs thickening, , mild centrilobular pulmonary emphysema and diffuse mild bronchial wall thickening (see report). - Per Dr. Orvan Seen, planning for CABG +/- mitral surgery on Monday.  Recommended considering loading with amiodarone over weekend.  Will defer this to CT surgery/cardiology.     HTN urgency: BP's 180/ 105 to > 200's on presentation. Has not seen doctor in 7 years.  BP's down w/ IV ntg and lasix.  - follow serial BP's - improved today - cardiology following will hold off on initiating BP meds as pt is pending ischemia/ CHF work-up  Renal Insufficiency BUN 16, creatinine 1.4 on presentation. No prior labs for comparison.  - 1.24 today, follow with diuresis -Repeat BMP in Curtis [name].m. -Monitor urine output - check UA (RBC's - will need outpatient follow up)  Microscopic Hematuria: needs outpatient follow up for this  Hyperglycemia   Prediabete Random blood glucose 278. -Check A1c 5.8 -continue to monitor - Will start SSI  Leukocytosis Afebrile. White count 21.1 with left shift. Dyspnea was acute onset and chest x-ray with findings consistent with pulmonary edema. SARS-CoV-2 test negative. Not endorsing any infectious symptoms. -Relatively stable, continue to follow WBC count -Check procalcitonin level (wnl) -UA pending (not c/w UTI)  Tachycardia Heart rate persistently in the 150s and initial EKG showing narrow complex tachycardia.Patient received adenosine 6 mg in the ED. Repeat EKG showing sinus tachycardia.  - Heart rate currently improved and in the 80s to  90s. -Cardiac monitoring -Check TSH (wnl), free T4 levels (elevated to 1.38) - would repeat outpatient  Elevated T bili T bili 2.2, no significant elevation of remainder of LFTs. - Fluctuating, continue to monitor (2.5 most recently)  DVT prophylaxis: heparin gtt Code Status: full  Family Communication: none at bedside Disposition Plan: pending cardiology and CT surgery sign off   Consultants:   TCTS  Cardiology  Procedures:  TEE IMPRESSIONS    1. Left ventricular ejection fraction, by visual estimation, is 25 to 30%. The left ventricle has severely decreased function. Normal left ventricular size. There is no left ventricular hypertrophy.  2. Global right ventricle has normal systolic function.The right ventricular size is normal. No increase in right ventricular wall thickness.  3. Left atrial size was moderately dilated.  4. Right atrial size was normal.  5. The mitral valve is normal in structure. Moderate mitral valve regurgitation. No evidence of mitral stenosis.  6. PISA radius - 0.6cm     ERO - 0.17 cm2     MR regugitant volume - 3m     Vena contracta - 0.5 cm.  7. The tricuspid valve is normal in structure. Tricuspid valve regurgitation was not visualized by color flow Doppler.  8. The aortic valve is tricuspid Aortic valve regurgitation is mild by color flow Doppler. Structurally normal aortic valve, with no evidence of sclerosis or stenosis.  9. Vena contracta of AR jet is 0.2cm. 10. The pulmonic valve was normal in structure. Pulmonic valve regurgitation is trivial by color flow Doppler. 11. The inferior vena cava  is normal in size with greater than 50% respiratory variability, suggesting right atrial pressure of 3 mmHg.  Echo IMPRESSIONS    1. Left ventricular ejection fraction, by visual estimation, is 20 to 25%. The left ventricle has severely decreased function. Mildly increased left ventricular size. There is mildly increased left ventricular  hypertrophy.  2. There is global left ventricular hypokinesis with inferior wall akinesis.  3. Abnormal septal motion consistent with left bundle branch block.  4. Indeterminate diastolic filling due to E-Curtis [name] fusion pattern of LV diastolic filling.  5. Global right ventricle was not assessed.The right ventricular size is normal. No increase in right ventricular wall thickness.  6. Left atrial size was mildly dilated.  7. Right atrial size was normal.  8. Mild mitral annular calcification.  9. The mitral valve is grossly normal. Moderate to severe mitral valve regurgitation. No evidence of mitral stenosis. 10. Posterior leaflet tethering is noted. 11. The tricuspid valve is normal in structure. Tricuspid valve regurgitation is mild. 12. The aortic valve is normal in structure. Aortic valve regurgitation is mild to moderate by color flow Doppler. Structurally normal aortic valve, with no evidence of sclerosis or stenosis. 13. The pulmonic valve was grossly normal. Pulmonic valve regurgitation is not visualized by color flow Doppler. 14. Mildly elevated pulmonary artery systolic pressure.  Cath  Acute systolic heart failure with pulmonary edema, echo LVEF 25%, and normal LVEDP after diuresis.  Assume that the patient has stunned myocardium (No symptoms prior to presentation).  Severe multivessel coronary disease with 40% tubular left main.    50 followed by 60 to 70% proximal LAD tandem lesions.  Severe 80% diffuse disease in the large first diagonal.    The circumflex contains multifocal high-grade stenoses in the mid body and distal before the origin of the left PDA.  The large third obtuse marginal contains 99% stenosis.    The right coronary is codominant and is totally occluded with collaterals filling from left to right and right to right.  RECOMMENDATIONS:   After stabilization, the patient should be considered for surgical revascularization if targets are adequate.  TCTS  consultation is recommended but because of the late completion of the procedure the office was closed.  Please consult in [email or web address].m.  Antimicrobials:  Anti-infectives (From admission, onward)   Start     Dose/Rate Route Frequency Ordered Stop   11/30/18 0400  vancomycin (VANCOCIN) 1,250 mg in sodium chloride 0.9 % 250 mL IVPB  Status:  Discontinued     1,250 mg 166.7 mL/hr over 90 Minutes Intravenous To Surgery 11/27/18 0819 11/27/18 0823   11/30/18 0400  levofloxacin (LEVAQUIN) IVPB 500 mg  Status:  Discontinued     500 mg 100 mL/hr over 60 Minutes Intravenous To Surgery 11/27/18 0819 11/27/18 1035   11/30/18 0400  vancomycin (VANCOCIN) 1,500 mg in sodium chloride 0.9 % 250 mL IVPB  Status:  Discontinued     1,500 mg 125 mL/hr over 120 Minutes Intravenous To Surgery 11/27/18 0823 11/27/18 1035   11/30/18 0400  vancomycin (VANCOCIN) 1,500 mg in sodium chloride 0.9 % 250 mL IVPB     1,500 mg 125 mL/hr over 120 Minutes Intravenous To Surgery 11/27/18 1040 12/01/18 0400   11/30/18 0400  fluconazole (DIFLUCAN) IVPB 400 mg     400 mg 100 mL/hr over 120 Minutes Intravenous To Surgery 11/27/18 1040 12/01/18 0400   11/30/18 0400  rifampin (RIFADIN) 600 mg in sodium chloride 0.9 % 100 mL IVPB     600 mg  200 mL/hr over 30 Minutes Intravenous To Surgery 11/27/18 1040 12/01/18 0400   11/30/18 0400  vancomycin (VANCOCIN) powder 1,000 mg     1,000 mg Other To Surgery 11/27/18 1040 12/01/18 0400   11/30/18 0400  levofloxacin (LEVAQUIN) IVPB 500 mg     500 mg 100 mL/hr over 60 Minutes Intravenous To Surgery 11/27/18 1040 12/01/18 0400         Subjective: No complaints Having Curtis [name] hard time sleeping in hospital  Objective: Vitals:   11/28/18 0018 11/28/18 0529 11/28/18 0820 11/28/18 1214  BP: 123/77 (!) 124/96 (!) 144/84 131/75  Pulse: 75 72 74 81  Resp: 20 15 (!) 22 (!) 24  Temp: 98.5 F (36.9 C) 97.8 F (36.6 C) 98.3 F (36.8 C) 98.3 F (36.8 C)  TempSrc: Oral Oral Oral Oral  SpO2: 96% 95%  97% 96%  Weight:  84.2 kg    Height:        Intake/Output Summary (Last 24 hours) at 11/28/2018 1600 Last data filed at 11/28/2018 1234 Gross per 24 hour  Intake 1472.86 ml  Output 1315 ml  Net 157.86 ml   Filed Weights   11/26/18 0459 11/27/18 0413 11/28/18 0529  Weight: 85.8 kg 85.2 kg 84.2 kg    Examination:  General: No acute distress. Cardiovascular: Heart sounds show Curtis [name] regular rate, and rhythm.  Lungs: Clear to auscultation bilaterally  Abdomen: Soft, nontender, nondistended  Neurological: Alert and oriented 3. Moves all extremities 4. Cranial nerves II through XII grossly intact. Skin: Warm and dry. No rashes or lesions. Extremities: No clubbing or cyanosis. No edema.   Data Reviewed: I have personally reviewed following labs and imaging studies  CBC: Recent Labs  Lab 11/23/18 2328  11/25/18 0810 11/25/18 1318 11/26/18 0158 11/27/18 0253 11/28/18 0206  WBC 21.1*   < > 16.2* 17.0* 12.5* 11.0* 13.6*  NEUTROABS 12.9*  --   --   --   --   --   --   HGB 16.6   < > 15.1 16.6 15.8 16.4 16.3  HCT 49.0   < > 43.4 47.6 46.7 46.2 47.6  MCV 102.1*   < > 97.7 97.5 97.9 96.3 96.7  PLT 395   < > 271 298 304 262 286   < > = values in this interval not displayed.   Basic Metabolic Panel: Recent Labs  Lab 11/24/18 0327 11/24/18 0820 11/25/18 0834 11/26/18 0158 11/27/18 0253 11/28/18 0206  NA 139  --  141 138 138 136  K 3.6  --  3.2* 3.3* 3.5 3.4*  CL 102  --  104 98 98 99  CO2 23  --  '26 28 26 25  '$ GLUCOSE 192*  --  134* 136* 134* 149*  BUN 15  --  24* 25* 25* 26*  CREATININE 1.30*  --  1.22 1.18 1.11 1.24  CALCIUM 8.8*  --  8.6* 8.8* 8.7* 8.8*  MG  --  2.0  --  1.9  --   --    GFR: Estimated Creatinine Clearance: 59.4 mL/min (by C-G formula based on SCr of 1.24 mg/dL). Liver Function Tests: Recent Labs  Lab 11/23/18 2328 11/24/18 0327 11/25/18 0834 11/26/18 0158  AST 45* 59* 33 24  ALT 30 38 29 27  ALKPHOS 89 80 70 71  BILITOT 2.2* 1.4* 1.3* 2.5*    PROT 7.0 7.0 6.7 6.9  ALBUMIN 3.8 3.9 3.5 3.8   No results for input(s): LIPASE, AMYLASE in the last 168 hours. No results  for input(s): AMMONIA in the last 168 hours. Coagulation Profile: No results for input(s): INR, PROTIME in the last 168 hours. Cardiac Enzymes: No results for input(s): CKTOTAL, CKMB, CKMBINDEX, TROPONINI in the last 168 hours. BNP (last 3 results) No results for input(s): PROBNP in the last 8760 hours. HbA1C: No results for input(s): HGBA1C in the last 72 hours. CBG: Recent Labs  Lab 11/23/18 2326  GLUCAP 272*   Lipid Profile: No results for input(s): CHOL, HDL, LDLCALC, TRIG, CHOLHDL, LDLDIRECT in the last 72 hours. Thyroid Function Tests: No results for input(s): TSH, T4TOTAL, FREET4, T3FREE, THYROIDAB in the last 72 hours. Anemia Panel: No results for input(s): VITAMINB12, FOLATE, FERRITIN, TIBC, IRON, RETICCTPCT in the last 72 hours. Sepsis Labs: Recent Labs  Lab 11/24/18 0327  PROCALCITON <0.10    Recent Results (from the past 240 hour(s))  SARS Coronavirus 2 [facility] order, Performed in [facility] hospital lab) Nasopharyngeal Nasopharyngeal Swab     Status: None   Collection Time: 11/23/18 11:29 PM   Specimen: Nasopharyngeal Swab  Result Value Ref Range Status   SARS Coronavirus 2 NEGATIVE NEGATIVE Final    Comment: (NOTE) If result is NEGATIVE SARS-CoV-2 target nucleic acids are NOT DETECTED. The SARS-CoV-2 RNA is generally detectable in upper and lower  respiratory specimens during the acute phase of infection. The lowest  concentration of SARS-CoV-2 viral copies this assay can detect is 250  copies / mL. Curtis [name] negative result does not preclude SARS-CoV-2 infection  and should not be used as the sole basis for treatment or other  patient management decisions.  Curtis [name] negative result may occur with  improper specimen collection / handling, submission of specimen other  than nasopharyngeal swab, presence of viral mutation(s) within the  areas  targeted by this assay, and inadequate number of viral copies  (<250 copies / mL). Curtis [name] negative result must be combined with clinical  observations, patient history, and epidemiological information. If result is POSITIVE SARS-CoV-2 target nucleic acids are DETECTED. The SARS-CoV-2 RNA is generally detectable in upper and lower  respiratory specimens dur ing the acute phase of infection.  Positive  results are indicative of active infection with SARS-CoV-2.  Clinical  correlation with patient history and other diagnostic information is  necessary to determine patient infection status.  Positive results do  not rule out bacterial infection or co-infection with other viruses. If result is PRESUMPTIVE POSTIVE SARS-CoV-2 nucleic acids MAY BE PRESENT.   Curtis [name] presumptive positive result was obtained on the submitted specimen  and confirmed on repeat testing.  While 2019 novel coronavirus  (SARS-CoV-2) nucleic acids may be present in the submitted sample  additional confirmatory testing may be necessary for epidemiological  and / or clinical management purposes  to differentiate between  SARS-CoV-2 and other Sarbecovirus currently known to infect humans.  If clinically indicated additional testing with an alternate test  methodology [phone]) is advised. The SARS-CoV-2 RNA is generally  detectable in upper and lower respiratory sp ecimens during the acute  phase of infection. The expected result is Negative. Fact Sheet for Patients:  StrictlyIdeas.no Fact Sheet for Healthcare Providers: BankingDealers.co.za This test is not yet approved or cleared by the [place] FDA and has been authorized for detection and/or diagnosis of SARS-CoV-2 by FDA under an Emergency Use Authorization (EUA).  This EUA will remain in effect (meaning this test can be used) for the duration of the COVID-19 declaration under Section 564(b)(1) of the Act, 21 U.S.C. section  360bbb-3(b)(1), unless the authorization is  terminated or revoked sooner. Performed at [facility], [place] [address] [place],  [place] [address]   MRSA PCR Screening     Status: None   Collection Time: 11/25/18  7:54 PM   Specimen: Nasal Mucosa; Nasopharyngeal  Result Value Ref Range Status   MRSA by PCR NEGATIVE NEGATIVE Final    Comment:        The GeneXpert MRSA Assay (FDA approved for NASAL specimens only), is one component of Kanyah Matsushima comprehensive MRSA colonization surveillance program. It is not intended to diagnose MRSA infection nor to guide or monitor treatment for MRSA infections. Performed at [facility], [place] [address], [place], [place] [address]          Radiology Studies: Ct Chest Wo Contrast  Result Date: 11/27/2018 CLINICAL DATA:  68 year old male undergoing preoperative evaluation prior to CABG. Assess for aortic disease. EXAM: CT CHEST WITHOUT CONTRAST TECHNIQUE: Multidetector CT imaging of the chest was performed following the standard protocol without IV contrast. COMPARISON:  None. FINDINGS: Cardiovascular: Limited evaluation in the absence of intravenous contrast. Two vessel aortic arch. The right brachiocephalic and left common carotid artery arise from Curtis [name] common origin. The aortic root is normal in caliber. The ascending thoracic aorta is mildly ectatic at 3.7 cm. Mild atherosclerotic calcifications are present throughout the aorta. Extensive coronary artery disease with calcifications throughout all visualized coronary arteries. Mild cardiomegaly. Trace pericardial fluid versus thickening anteriorly. The pulmonary artery is normal in caliber. Mediastinum/Nodes: Unremarkable CT appearance of the thyroid gland. No suspicious mediastinal or hilar adenopathy. No soft tissue mediastinal mass. The thoracic esophagus is unremarkable. Nonspecific 0.8 cm paraesophageal lymph node. Lungs/Pleura: Mild centrilobular pulmonary emphysema. Diffuse mild bronchial wall  thickening. Mild dependent atelectasis. Tiny calcified subpleural granuloma in the periphery of the left upper lobe noted incidentally. This is Curtis [name] benign finding. Upper Abdomen: Approximately 6 mm low-attenuation lesion in the medial hepatic dome is incompletely characterized in the absence of intravenous contrast. Statistically, this is likely Curtis [name] benign cyst. No acute abnormality within the visualized upper abdomen. Musculoskeletal: No acute fracture or aggressive appearing lytic or blastic osseous lesion. Multilevel degenerative endplate spurring. IMPRESSION: 1. Ectatic ascending thoracic aorta with Curtis [name] maximal diameter of 3.7 cm. 2. Scattered mild aortic atherosclerotic calcifications. Aortic Atherosclerosis (ICD10-170.0) 3. Mild pericardial fluid versus thickening in the anterior pericardium. 4. Common origin of the right brachiocephalic and left common carotid arteries (commonly called Curtis [name] bovine arch). 5. Mild centrilobular pulmonary emphysema and diffuse mild bronchial wall thickening suggests underlying COPD. 6. Additional ancillary findings as above. Electronically Signed   By: Jacqulynn Cadet M.D.   On: 11/27/2018 09:57        Scheduled Meds:  amiodarone  400 mg Oral BID   aspirin  81 mg Oral Daily   atorvastatin  80 mg Oral q1800   carvedilol  3.125 mg Oral BID WC   [START ON 11/30/2018] epinephrine  0-10 mcg/min Intravenous To OR   furosemide  40 mg Intravenous BID   [START ON 11/30/2018] heparin-papaverine-plasmalyte irrigation   Irrigation To OR   [START ON 11/30/2018] insulin   Intravenous To OR   [START ON 11/30/2018] [place] Blood Cardioplegia vial (lidocaine/magnesium/mannitol 0.26g-4g-6.4g)   Intracoronary Once   [START ON 11/30/2018] phenylephrine  0-100 mcg/min Intravenous To OR   [START ON 11/30/2018] potassium chloride  80 mEq Other To OR   sodium chloride flush  3 mL Intravenous Q12H   [START ON 11/30/2018] tranexamic acid  15 mg/kg Intravenous To OR   [  START ON  11/30/2018] tranexamic acid  2 mg/kg Intracatheter To OR   [START ON 11/30/2018] vancomycin  1,000 mg Other To OR   Continuous Infusions:  sodium chloride     [START ON 11/30/2018] dexmedetomidine     [START ON 11/30/2018] DOBUTamine     [START ON 11/30/2018] DOPamine     [START ON 11/30/2018] fluconazole (DIFLUCAN) IV     [START ON 11/30/2018] heparin 30,000 units/NS 1000 mL solution for CELLSAVER     heparin 1,450 Units/hr (11/28/18 0300)   [START ON 11/30/2018] levofloxacin (LEVAQUIN) IV     [START ON 11/30/2018] milrinone     nitroGLYCERIN     [START ON 11/30/2018] nitroGLYCERIN     [START ON 11/30/2018] norepinephrine (LEVOPHED) Adult infusion     [START ON 11/30/2018] rifampin (RIFADIN) IVPB     [START ON 11/30/2018] tranexamic acid (CYKLOKAPRON) infusion ([facility])     [START ON 11/30/2018] vancomycin     [START ON 11/30/2018] vasopressin (PITRESSIN) infusion - *FOR SHOCK*       LOS: 4 days    Time spent: over 30 min    Fayrene Helper, MD [facility] Pager AMION  If 7PM-7AM, please contact night-coverage [email or web address] Password [email or web address] 11/28/2018, 4:00 PM

## 2018-11-28 NOTE — Progress Notes (Signed)
Progress Note  Patient Name: Curtis [name] Date of Encounter: 11/28/2018  Primary Cardiologist: Chrystie NoseKenneth C Hilty, MD   Subjective   Denies angina and dyspnea.  No arrhythmia on monitor.  Intravenous amiodarone has been started.  Inpatient Medications    Scheduled Meds: . amiodarone  400 mg Oral BID  . aspirin  81 mg Oral Daily  . atorvastatin  80 mg Oral q1800  . carvedilol  3.125 mg Oral BID WC  . [START ON 11/30/2018] epinephrine  0-10 mcg/min Intravenous To OR  . furosemide  40 mg Intravenous BID  . [START ON 11/30/2018] heparin-papaverine-plasmalyte irrigation   Irrigation To OR  . [START ON 11/30/2018] insulin   Intravenous To OR  . [START ON 11/30/2018] [facility] Blood Cardioplegia vial (lidocaine/magnesium/mannitol 0.26g-4g-6.4g)   Intracoronary Once  . [START ON 11/30/2018] phenylephrine  0-100 mcg/min Intravenous To OR  . [START ON 11/30/2018] potassium chloride  80 mEq Other To OR  . sodium chloride flush  3 mL Intravenous Q12H  . [START ON 11/30/2018] tranexamic acid  15 mg/kg Intravenous To OR  . [START ON 11/30/2018] tranexamic acid  2 mg/kg Intracatheter To OR  . [START ON 11/30/2018] vancomycin  1,000 mg Other To OR   Continuous Infusions: . sodium chloride    . [START ON 11/30/2018] dexmedetomidine    . [START ON 11/30/2018] DOBUTamine    . [START ON 11/30/2018] DOPamine    . [START ON 11/30/2018] fluconazole (DIFLUCAN) IV    . [START ON 11/30/2018] heparin 30,000 units/NS 1000 mL solution for CELLSAVER    . heparin 1,450 Units/hr (11/28/18 0300)  . [START ON 11/30/2018] levofloxacin (LEVAQUIN) IV    . [START ON 11/30/2018] milrinone    . nitroGLYCERIN    . [START ON 11/30/2018] nitroGLYCERIN    . [START ON 11/30/2018] norepinephrine (LEVOPHED) Adult infusion    . [START ON 11/30/2018] rifampin (RIFADIN) IVPB    . [START ON 11/30/2018] tranexamic acid (CYKLOKAPRON) infusion ([facility])    . [START ON 11/30/2018] vancomycin    . [START ON 11/30/2018] vasopressin (PITRESSIN) infusion -  *FOR SHOCK*     PRN Meds: sodium chloride, acetaminophen, ondansetron (ZOFRAN) IV, sodium chloride flush   Vital Signs    Vitals:   11/28/18 0018 11/28/18 0529 11/28/18 0820 11/28/18 1214  BP: 123/77 (!) 124/96 (!) 144/84 131/75  Pulse: 75 72 74 81  Resp: 20 15 (!) 22 (!) 24  Temp: 98.5 F (36.9 C) 97.8 F (36.6 C) 98.3 F (36.8 C) 98.3 F (36.8 C)  TempSrc: Oral Oral Oral Oral  SpO2: 96% 95% 97% 96%  Weight:  84.2 kg    Height:        Intake/Output Summary (Last 24 hours) at 11/28/2018 1250 Last data filed at 11/28/2018 1234 Gross per 24 hour  Intake 1712.86 ml  Output 1615 ml  Net 97.86 ml   Last 3 Weights 11/28/2018 11/27/2018 11/26/2018  Weight (lbs) 185 lb 9.6 oz 187 lb 13.3 oz 189 lb 2.5 oz  Weight (kg) 84.188 kg 85.2 kg 85.8 kg      Telemetry    Sinus rhythm - Personally Reviewed  ECG    Rhythm, left bundle branch block- Personally Reviewed  Physical Exam  Comfortable lying flat in bed GEN: No acute distress.   Neck: No JVD Cardiac: RRR, paradoxically split second heart sound, 2/6 holosystolic apical murmur, rubs, or gallops.  Respiratory: Clear to auscultation bilaterally. GI: Soft, nontender, non-distended  MS: No edema; No deformity. Neuro:  Nonfocal  Psych: Normal affect   Labs    High Sensitivity Troponin:   Recent Labs  Lab 11/23/18 2328 11/24/18 0327 11/24/18 0820  TROPONINIHS 63* 4,477* 9,016*      Chemistry Recent Labs  Lab 11/24/18 0327 11/25/18 0834 11/26/18 0158 11/27/18 0253 11/28/18 0206  NA 139 141 138 138 136  K 3.6 3.2* 3.3* 3.5 3.4*  CL 102 104 98 98 99  CO2 23 26 28 26 25   GLUCOSE 192* 134* 136* 134* 149*  BUN 15 24* 25* 25* 26*  CREATININE 1.30* 1.22 1.18 1.11 1.24  CALCIUM 8.8* 8.6* 8.8* 8.7* 8.8*  PROT 7.0 6.7 6.9  --   --   ALBUMIN 3.9 3.5 3.8  --   --   AST 59* 33 24  --   --   ALT 38 29 27  --   --   ALKPHOS 80 70 71  --   --   BILITOT 1.4* 1.3* 2.5*  --   --   GFRNONAA 57* >60 >60 >60 >60  GFRAA >60  >60 >60 >60 >60  ANIONGAP 14 11 12 14 12      Hematology Recent Labs  Lab 11/26/18 0158 11/27/18 0253 11/28/18 0206  WBC 12.5* 11.0* 13.6*  RBC 4.77 4.80 4.92  HGB 15.8 16.4 16.3  HCT 46.7 46.2 47.6  MCV 97.9 96.3 96.7  MCH 33.1 34.2* 33.1  MCHC 33.8 35.5 34.2  RDW 12.1 12.0 12.0  PLT 304 262 286    BNP Recent Labs  Lab 11/23/18 2328  BNP 507.9*     DDimer No results for input(s): DDIMER in the last 168 hours.   Radiology    Ct Chest Wo Contrast  Result Date: 11/27/2018 CLINICAL DATA:  68 year old male undergoing preoperative evaluation prior to CABG. Assess for aortic disease. EXAM: CT CHEST WITHOUT CONTRAST TECHNIQUE: Multidetector CT imaging of the chest was performed following the standard protocol without IV contrast. COMPARISON:  None. FINDINGS: Cardiovascular: Limited evaluation in the absence of intravenous contrast. Two vessel aortic arch. The right brachiocephalic and left common carotid artery arise from a common origin. The aortic root is normal in caliber. The ascending thoracic aorta is mildly ectatic at 3.7 cm. Mild atherosclerotic calcifications are present throughout the aorta. Extensive coronary artery disease with calcifications throughout all visualized coronary arteries. Mild cardiomegaly. Trace pericardial fluid versus thickening anteriorly. The pulmonary artery is normal in caliber. Mediastinum/Nodes: Unremarkable CT appearance of the thyroid gland. No suspicious mediastinal or hilar adenopathy. No soft tissue mediastinal mass. The thoracic esophagus is unremarkable. Nonspecific 0.8 cm paraesophageal lymph node. Lungs/Pleura: Mild centrilobular pulmonary emphysema. Diffuse mild bronchial wall thickening. Mild dependent atelectasis. Tiny calcified subpleural granuloma in the periphery of the left upper lobe noted incidentally. This is a benign finding. Upper Abdomen: Approximately 6 mm low-attenuation lesion in the medial hepatic dome is incompletely  characterized in the absence of intravenous contrast. Statistically, this is likely a benign cyst. No acute abnormality within the visualized upper abdomen. Musculoskeletal: No acute fracture or aggressive appearing lytic or blastic osseous lesion. Multilevel degenerative endplate spurring. IMPRESSION: 1. Ectatic ascending thoracic aorta with a maximal diameter of 3.7 cm. 2. Scattered mild aortic atherosclerotic calcifications. Aortic Atherosclerosis (ICD10-170.0) 3. Mild pericardial fluid versus thickening in the anterior pericardium. 4. Common origin of the right brachiocephalic and left common carotid arteries (commonly called a bovine arch). 5. Mild centrilobular pulmonary emphysema and diffuse mild bronchial wall thickening suggests underlying COPD. 6. Additional ancillary findings as above. Electronically Signed  By: Malachy Moan M.D.   On: 11/27/2018 09:57    Cardiac Studies   TEE 11/26/2018  1. Left ventricular ejection fraction, by visual estimation, is 25 to 30%. The left ventricle has severely decreased function. Normal left ventricular size. There is no left ventricular hypertrophy.  2. Global right ventricle has normal systolic function.The right ventricular size is normal. No increase in right ventricular wall thickness.  3. Left atrial size was moderately dilated.  4. Right atrial size was normal.  5. The mitral valve is normal in structure. Moderate mitral valve regurgitation. No evidence of mitral stenosis.  6. PISA radius - 0.6cm     ERO - 0.17 cm2     MR regugitant volume - 81ml     Vena contracta - 0.5 cm.  7. The tricuspid valve is normal in structure. Tricuspid valve regurgitation was not visualized by color flow Doppler.  8. The aortic valve is tricuspid Aortic valve regurgitation is mild by color flow Doppler. Structurally normal aortic valve, with no evidence of sclerosis or stenosis.  9. Vena contracta of AR jet is 0.2cm. 10. The pulmonic valve was normal in  structure. Pulmonic valve regurgitation is trivial by color flow Doppler. 11. The inferior vena cava is normal in size with greater than 50% respiratory variability, suggesting right atrial pressure of 3 mmHg.  Cardiac cath 11/24/2018  Acute systolic heart failure with pulmonary edema, echo LVEF 25%, and normal LVEDP after diuresis.  Assume that the patient has stunned myocardium (No symptoms prior to presentation).  Severe multivessel coronary disease with 40% tubular left main.    50 followed by 60 to 70% proximal LAD tandem lesions.  Severe 80% diffuse disease in the large first diagonal.    The circumflex contains multifocal high-grade stenoses in the mid body and distal before the origin of the left PDA.  The large third obtuse marginal contains 99% stenosis.    The right coronary is codominant and is totally occluded with collaterals filling from left to right and right to right.  RECOMMENDATIONS:   After stabilization, the patient should be considered for surgical revascularization if targets are adequate.  TCTS consultation is recommended but because of the late completion of the procedure the office was closed.  Please consult in a.m.  Patient Profile     68 y.o. male with NSTEMI, severe ischemic cardiomyopathy and acute systolic heart failure/pulmonary edema, moderate mitral regurgitation in the setting of multivessel CAD  Assessment & Plan    1. CHF: Appears clinically euvolemic.  EF 25%.   2. CAD: Plan for coronary bypass surgery on Monday.   3. MR: Based on TEE findings unlikely to need mitral valve repair since mitral regurgitation is probably secondary to ischemic heart disease and is only moderate.  4. LBBB: Consider placing an epicardial left ventricular lead tunneled to left subclavian area, for possible need for biventricular pacing if LVEF does not recover.  Intravenous amiodarone started due to high likelihood of postoperative atrial fibrillation.  Watch for  bradyarrhythmia since he has an underlying left bundle branch block.   5. HLP: Aggressive treatment of hypercholesterolemia (high intensity statin has been started).     For questions or updates, please contact [facility] Please consult [email or web address] for contact info under        Signed, Thurmon Fair, MD  11/28/2018, 12:50 PM

## 2018-11-28 NOTE — Progress Notes (Signed)
     [address]       [place], [address]             [phone]       No events Scheduled for CABG possible mitral for Monday  [place]

## 2018-11-29 ENCOUNTER — Inpatient Hospital Stay (HOSPITAL_COMMUNITY): Payer: Medicare Other

## 2018-11-29 DIAGNOSIS — Z01810 Encounter for preprocedural cardiovascular examination: Secondary | ICD-10-CM

## 2018-11-29 DIAGNOSIS — I447 Left bundle-branch block, unspecified: Secondary | ICD-10-CM

## 2018-11-29 LAB — URINALYSIS, ROUTINE W REFLEX MICROSCOPIC
Bilirubin Urine: NEGATIVE
Glucose, UA: NEGATIVE mg/dL
Ketones, ur: NEGATIVE mg/dL
Leukocytes,Ua: NEGATIVE
Nitrite: NEGATIVE
Protein, ur: NEGATIVE mg/dL
Specific Gravity, Urine: 1.013 (ref 1.005–1.030)
pH: 5 (ref 5.0–8.0)

## 2018-11-29 LAB — CBC
HCT: 47.6 % (ref 39.0–52.0)
Hemoglobin: 16.2 g/dL (ref 13.0–17.0)
MCH: 33.3 pg (ref 26.0–34.0)
MCHC: 34 g/dL (ref 30.0–36.0)
MCV: 97.7 fL (ref 80.0–100.0)
Platelets: 301 10*3/uL (ref 150–400)
RBC: 4.87 MIL/uL (ref 4.22–5.81)
RDW: 12 % (ref 11.5–15.5)
WBC: 14.8 10*3/uL — ABNORMAL HIGH (ref 4.0–10.5)
nRBC: 0 % (ref 0.0–0.2)

## 2018-11-29 LAB — COMPREHENSIVE METABOLIC PANEL
ALT: 30 U/L (ref 0–44)
AST: 25 U/L (ref 15–41)
Albumin: 4 g/dL (ref 3.5–5.0)
Alkaline Phosphatase: 75 U/L (ref 38–126)
Anion gap: 13 (ref 5–15)
BUN: 32 mg/dL — ABNORMAL HIGH (ref 8–23)
CO2: 22 mmol/L (ref 22–32)
Calcium: 8.9 mg/dL (ref 8.9–10.3)
Chloride: 102 mmol/L (ref 98–111)
Creatinine, Ser: 1.38 mg/dL — ABNORMAL HIGH (ref 0.61–1.24)
GFR calc Af Amer: >60 mL/min (ref >60)
GFR calc non Af Amer: 53 mL/min — ABNORMAL LOW (ref >60)
Glucose, Bld: 143 mg/dL — ABNORMAL HIGH (ref 70–99)
Potassium: 3.6 mmol/L (ref 3.5–5.1)
Sodium: 137 mmol/L (ref 135–145)
Total Bilirubin: 2 mg/dL — ABNORMAL HIGH (ref 0.3–1.2)
Total Protein: 7.2 g/dL (ref 6.5–8.1)

## 2018-11-29 LAB — GLUCOSE, CAPILLARY
Glucose-Capillary: 158 mg/dL — ABNORMAL HIGH (ref 70–99)
Glucose-Capillary: 219 mg/dL — ABNORMAL HIGH (ref 70–99)
Glucose-Capillary: 118 mg/dL — ABNORMAL HIGH (ref 70–99)
Glucose-Capillary: 149 mg/dL — ABNORMAL HIGH (ref 70–99)

## 2018-11-29 LAB — HEPARIN LEVEL (UNFRACTIONATED)
Heparin Unfractionated: 0.71 IU/mL — ABNORMAL HIGH (ref 0.30–0.70)
Heparin Unfractionated: 0.42 IU/mL (ref 0.30–0.70)

## 2018-11-29 LAB — ABO/RH: ABO/RH(D): O POS

## 2018-11-29 LAB — MAGNESIUM: Magnesium: 2 mg/dL (ref 1.7–2.4)

## 2018-11-29 MED ORDER — BISACODYL 5 MG PO TBEC: 5.0000 mg | DELAYED_RELEASE_TABLET | Freq: Once | ORAL | Status: DC

## 2018-11-29 MED ORDER — METOPROLOL TARTRATE 12.5 MG HALF TABLET
12.5000 mg | ORAL_TABLET | Freq: Once | ORAL | Status: AC
  Administered 2018-11-30: 06:00:00 12.5 mg via ORAL
  Filled 2018-11-29: qty 1

## 2018-11-29 MED ORDER — CHLORHEXIDINE GLUCONATE CLOTH 2 % EX PADS
6.0000 | MEDICATED_PAD | Freq: Once | CUTANEOUS | Status: AC
  Administered 2018-11-30: 6 via TOPICAL

## 2018-11-29 MED ORDER — CHLORHEXIDINE GLUCONATE 0.12 % MT SOLN
15.0000 mL | Freq: Once | OROMUCOSAL | Status: AC
  Administered 2018-11-30: 06:00:00 15 mL via OROMUCOSAL
  Filled 2018-11-29: qty 15

## 2018-11-29 MED ORDER — TEMAZEPAM 15 MG PO CAPS: 15.0000 mg | ORAL_CAPSULE | Freq: Once | ORAL | Status: DC | PRN

## 2018-11-29 MED ORDER — CHLORHEXIDINE GLUCONATE CLOTH 2 % EX PADS
6.0000 | MEDICATED_PAD | Freq: Once | CUTANEOUS | Status: AC
  Administered 2018-11-29: 6 via TOPICAL

## 2018-11-29 NOTE — Anesthesia Preprocedure Evaluation (Addendum)
Anesthesia Evaluation  Patient identified by MRN, date of birth, ID band Patient awake    Reviewed: Allergy & Precautions, NPO status , Patient's Chart, lab work & pertinent test results  History of Anesthesia Complications Negative for: history of anesthetic complications  Airway Mallampati: II  TM Distance: >3 FB Neck ROM: Full    Dental  (+) Dental Advisory Given, Chipped   Pulmonary Current Smoker,    breath sounds clear to auscultation       Cardiovascular hypertension, + CAD, + Past MI and +CHF  + Valvular Problems/Murmurs MR  Rhythm:Regular Rate:Normal + Systolic murmurs  '20 TEE - EF 25 to 30%. Left atrial size was moderately dilated. Moderate mitral valve regurgitation. PISA radius - 0.6cm. ERO - 0.17 cm2. MR regugitant volume - 37ml. Vena contracta - 0.5 cm. Mild AI. Vena contracta of AR jet is 0.2cm. Trivial PR.   '20 Cath - Acute systolic heart failure with pulmonary edema, echo LVEF 25%, and normal LVEDP after diuresis.  Assume that the patient has stunned myocardium (No symptoms prior to presentation). Severe multivessel coronary disease with 40% tubular left main.   50 followed by 60 to 70% proximal LAD tandem lesions.  Severe 80% diffuse disease in the large first diagonal.   The circumflex contains multifocal high-grade stenoses in the mid body and distal before the origin of the left PDA.  The large third obtuse marginal contains 99% stenosis.   The right coronary is codominant and is totally occluded with collaterals filling from left to right and right to right.  '20 Carotid US - in process    Neuro/Psych negative neurological ROS  negative psych ROS   GI/Hepatic negative GI ROS, Neg liver ROS,   Endo/Other  negative endocrine ROS  Renal/GU Renal InsufficiencyRenal disease     Musculoskeletal negative musculoskeletal ROS (+)   Abdominal   Peds  Hematology negative hematology ROS (+)    Anesthesia Other Findings   Reproductive/Obstetrics                            Anesthesia Physical Anesthesia Plan  ASA: IV  Anesthesia Plan: General   Post-op Pain Management:    Induction: Intravenous  PONV Risk Score and Plan: 2 and Treatment may vary due to age or medical condition  Airway Management Planned: Oral ETT  Additional Equipment: Arterial line, CVP, PA Cath, TEE and Ultrasound Guidance Line Placement  Intra-op Plan:   Post-operative Plan: Post-operative intubation/ventilation  Informed Consent: I have reviewed the patients History and Physical, chart, labs and discussed the procedure including the risks, benefits and alternatives for the proposed anesthesia with the patient or authorized representative who has indicated his/her understanding and acceptance.     Dental advisory given  Plan Discussed with: CRNA and Anesthesiologist  Anesthesia Plan Comments:        Anesthesia Quick Evaluation

## 2018-11-29 NOTE — Progress Notes (Signed)
VASCULAR LAB PRELIMINARY  PRELIMINARY  PRELIMINARY  PRELIMINARY  Bilateral lower extremity vein mapping completed.    Preliminary report:  See CV proc for preliminary results.   Miliano Cotten, RVT 11/29/2018, 12:17 PM

## 2018-11-29 NOTE — Progress Notes (Signed)
VASCULAR LAB PRELIMINARY  PRELIMINARY  PRELIMINARY  PRELIMINARY  Pre CABG Dopplers completed.    Preliminary report:  See CV proc for preliminary results.   Bryton Romagnoli, RVT 11/29/2018, 7:01 PM

## 2018-11-29 NOTE — Progress Notes (Signed)
PROGRESS NOTE    Curtis [name]  BSJ:[id number] DOB: 1950-07-03 DOA: 11/23/2018 PCP: Patient, No Pcp Per   Brief Narrative:  Kal Chait Tamalyn Wadsworth 68 y.o.malewithnosignificant past medical history presenting to the hospitalvia EMS for evaluation of sudden onset shortness of breath which started at 10 PM yesterday. On EMS arrival, patient was noted to be pale and diaphoretic. Noted to be in significant distress, diaphoretic, tachycardic, and tachypneic. Oxygen saturation 60% on room air, improved to 84% with BVM. EMS reported multiple syncopal episodes in route but patient never lost pulses and stayed in sinus tachycardia. He was given epinephrine 0.3 mg, IV magnesium 2 g, and IV Solu-Medrol 125 mg in route. Patient states last night around 10 PM he woke up from his sleep feeling very short of breath. He was feeling fine during the day and was not having any difficulty breathing. Denies chest pain, cough, fevers, or chills. Denies history of hypertension or congestive heart failure. States the last time he saw Keandra Medero doctor was 7 years ago. He is currently not on any medications. No other complaints.  ED Course:Awake and alert on arrival to the ED. Tachycardic and tachypneic. Placed on BiPAP. ABG with pH 7.21, PCO2 63, and PO2 408.Systolic >765.Afebrile. White count 21.1 with left shift. BNP 507. High-sensitivity troponin 63. Heart rate persistently in the 150s in the ED and initial EKG showing narrow complex tachycardia. BUN 16, creatinine 1.4. No prior labs for comparison. T bili 2.2, AST 45. Alk phos and ALT normal. SARS-CoV-2 test negative. Chest x-ray showing mild cardiomegaly and moderate pulmonary edema. Patient received adenosine 6 mg, nitroglycerin 2% ointment, and Lasix 40 mg in the ED. Repeat ABG showing improvement with pH 7.35 and PCO2 44.Repeat EKG with sinus tachycardia, LBBB, and T wave inversions in inferior leads. No prior EKG for comparison.   Assessment & Plan:     Principal Problem:   NSTEMI (non-ST elevated myocardial infarction) ([place]) Active Problems:   Acute respiratory failure with hypoxia and hypercapnia (HCC)   Pulmonary edema   Hypertensive emergency   Elevated troponin   Leukocytosis   Hypertension   Coronary artery disease involving native heart without angina pectoris   Acute on chronic combined systolic and diastolic CHF (congestive heart failure) (HCC)  Acute hypoxic hypercapnic respiratory failure   Acute Systolic Heart Failure: new onset CHF vs flash pulm edema from HTN emergency.  No hx HTN but hasn't seen doctor in 7 yrs. Oxygen saturation 60% on room air and required BVM ventilation. Initial ABG with pH 7.21 and PCO2 63. Placed on BiPAP and subsequent ABG showing improvement of acidosis and hypercapnia. Systolic blood pressure above 200 on arrival to the ED. BNP 507. Chest x-ray showing mild cardiomegaly and moderate pulmonary edema.Work of breathing and blood pressure improved significantly after BiPAP,IV Lasix, and Nitropaste. Weaned to RA. -Monitor closely in the progressive care unit - Will hold lasix as he appears euvolemic and creatinine is rising - I/O, daily weights (good uop, weight downtrending).  -Echocardiogram - with EF 20-25%, LV severely decreased function, global L ventricular hypokinesis with inferior wall akinesis -low-sodium diet with fluid restriction -Continue supplemental oxygen, wean as tolerated - Cardiology consulting, appreciate recs  Elevated troponin/ NSTEMI: concern for ACS. Initial high-sensitivity troponin 63, repeat significantly elevated at 4477 and then rising to 9016. EKG with left bundle branch block and worsening QTC prolongation (QTc 526 - more recently improved to ~470s). -Aspirin 324 mg -Heparin infusion started -Continue to trend troponin -Check magnesium level -Cardiology c/s, appreciate  recs - continue ASA, coreg 3.125 mg BID - titrate as able - LDL 171, HDL 31.  A1c 5.8.   Continue statin. - cath with severe multivessel disease (see report).  Recommended CT surgery c/s. - Appreciate CT surgery consult - planning for TEE (TEE today with EF 25-30%, mod mitral valve regurgitation, aortic valve without sclerosis or stenosis - see repor) , PFT's (severe obstructive airway disease), LE arterial duplex, CT chest with ectatic ascending thoracic aorta, mild aortic atherosclerotic calcifications, mild pericardial fluid vs thickening, , mild centrilobular pulmonary emphysema and diffuse mild bronchial wall thickening (see report). - getting US doppler pre CABG and LE vein mapping today - Per Dr. Orvan Seen, planning for CABG +/- mitral surgery on Monday.  Recommended considering loading with amiodarone over weekend.  Will defer this to CT surgery/cardiology.     HTN urgency: BP's 180/ 105 to > 200's on presentation. Has not seen doctor in 7 years.  BP's down w/ IV ntg and lasix.  - follow serial BP's - improved today - cardiology following will hold off on initiating BP meds as pt is pending ischemia/ CHF work-up  Renal Insufficiency BUN 16, creatinine 1.4 on presentation. No prior labs for comparison.  - Creatinine 1.38 today - hold diuresis -Repeat BMP in Rowland Ericsson.m. -Monitor urine output - check UA (RBC's - will need outpatient follow up)  Microscopic Hematuria: needs outpatient follow up for this.  Discussed with pt.  Hyperglycemia   Prediabete Random blood glucose 278. -Check A1c 5.8 -continue to monitor - Will start SSI  Leukocytosis Afebrile. White count 21.1 with left shift. Dyspnea was acute onset and chest x-ray with findings consistent with pulmonary edema. SARS-CoV-2 test negative. Not endorsing any infectious symptoms. -Relatively stable, slightly uptrending - afebrile - continue to follow WBC count -Check procalcitonin level (wnl) -UA pending (not c/w UTI)  Tachycardia Heart rate persistently in the 150s and initial EKG showing narrow complex  tachycardia.Patient received adenosine 6 mg in the ED. Repeat EKG showing sinus tachycardia.  - Heart rate currently improved and in the 80s to 90s. -Cardiac monitoring -Check TSH (wnl), free T4 levels (elevated to 1.38) - would repeat outpatient  Elevated T bili T bili 2.2, no significant elevation of remainder of LFTs. - Fluctuating, continue to monitor (2.5 most recently)  DVT prophylaxis: heparin gtt Code Status: full  Family Communication: none at bedside Disposition Plan: pending cardiology and CT surgery sign off   Consultants:   TCTS  Cardiology  Procedures:  TEE IMPRESSIONS    1. Left ventricular ejection fraction, by visual estimation, is 25 to 30%. The left ventricle has severely decreased function. Normal left ventricular size. There is no left ventricular hypertrophy.  2. Global right ventricle has normal systolic function.The right ventricular size is normal. No increase in right ventricular wall thickness.  3. Left atrial size was moderately dilated.  4. Right atrial size was normal.  5. The mitral valve is normal in structure. Moderate mitral valve regurgitation. No evidence of mitral stenosis.  6. PISA radius - 0.6cm     ERO - 0.17 cm2     MR regugitant volume - 76m     Vena contracta - 0.5 cm.  7. The tricuspid valve is normal in structure. Tricuspid valve regurgitation was not visualized by color flow Doppler.  8. The aortic valve is tricuspid Aortic valve regurgitation is mild by color flow Doppler. Structurally normal aortic valve, with no evidence of sclerosis or stenosis.  9. Vena contracta of AR jet  is 0.2cm. 10. The pulmonic valve was normal in structure. Pulmonic valve regurgitation is trivial by color flow Doppler. 11. The inferior vena cava is normal in size with greater than 50% respiratory variability, suggesting right atrial pressure of 3 mmHg.  Echo IMPRESSIONS    1. Left ventricular ejection fraction, by visual estimation, is 20  to 25%. The left ventricle has severely decreased function. Mildly increased left ventricular size. There is mildly increased left ventricular hypertrophy.  2. There is global left ventricular hypokinesis with inferior wall akinesis.  3. Abnormal septal motion consistent with left bundle branch block.  4. Indeterminate diastolic filling due to E-Tiffney Haughton fusion pattern of LV diastolic filling.  5. Global right ventricle was not assessed.The right ventricular size is normal. No increase in right ventricular wall thickness.  6. Left atrial size was mildly dilated.  7. Right atrial size was normal.  8. Mild mitral annular calcification.  9. The mitral valve is grossly normal. Moderate to severe mitral valve regurgitation. No evidence of mitral stenosis. 10. Posterior leaflet tethering is noted. 11. The tricuspid valve is normal in structure. Tricuspid valve regurgitation is mild. 12. The aortic valve is normal in structure. Aortic valve regurgitation is mild to moderate by color flow Doppler. Structurally normal aortic valve, with no evidence of sclerosis or stenosis. 13. The pulmonic valve was grossly normal. Pulmonic valve regurgitation is not visualized by color flow Doppler. 14. Mildly elevated pulmonary artery systolic pressure.  Cath  Acute systolic heart failure with pulmonary edema, echo LVEF 25%, and normal LVEDP after diuresis.  Assume that the patient has stunned myocardium (No symptoms prior to presentation).  Severe multivessel coronary disease with 40% tubular left main.    50 followed by 60 to 70% proximal LAD tandem lesions.  Severe 80% diffuse disease in the large first diagonal.    The circumflex contains multifocal high-grade stenoses in the mid body and distal before the origin of the left PDA.  The large third obtuse marginal contains 99% stenosis.    The right coronary is codominant and is totally occluded with collaterals filling from left to right and right to  right.  RECOMMENDATIONS:   After stabilization, the patient should be considered for surgical revascularization if targets are adequate.  TCTS consultation is recommended but because of the late completion of the procedure the office was closed.  Please consult in Amilya Haver.m.  Antimicrobials:  Anti-infectives (From admission, onward)   Start     Dose/Rate Route Frequency Ordered Stop   11/30/18 0400  vancomycin (VANCOCIN) 1,250 mg in sodium chloride 0.9 % 250 mL IVPB  Status:  Discontinued     1,250 mg 166.7 mL/hr over 90 Minutes Intravenous To Surgery 11/27/18 0819 11/27/18 0823   11/30/18 0400  levofloxacin (LEVAQUIN) IVPB 500 mg  Status:  Discontinued     500 mg 100 mL/hr over 60 Minutes Intravenous To Surgery 11/27/18 0819 11/27/18 1035   11/30/18 0400  vancomycin (VANCOCIN) 1,500 mg in sodium chloride 0.9 % 250 mL IVPB  Status:  Discontinued     1,500 mg 125 mL/hr over 120 Minutes Intravenous To Surgery 11/27/18 0823 11/27/18 1035   11/30/18 0400  vancomycin (VANCOCIN) 1,500 mg in sodium chloride 0.9 % 250 mL IVPB     1,500 mg 125 mL/hr over 120 Minutes Intravenous To Surgery 11/27/18 1040 12/01/18 0400   11/30/18 0400  fluconazole (DIFLUCAN) IVPB 400 mg     400 mg 100 mL/hr over 120 Minutes Intravenous To Surgery 11/27/18 1040 12/01/18  0400   11/30/18 0400  rifampin (RIFADIN) 600 mg in sodium chloride 0.9 % 100 mL IVPB     600 mg 200 mL/hr over 30 Minutes Intravenous To Surgery 11/27/18 1040 12/01/18 0400   11/30/18 0400  vancomycin (VANCOCIN) powder 1,000 mg     1,000 mg Other To Surgery 11/27/18 1040 12/01/18 0400   11/30/18 0400  levofloxacin (LEVAQUIN) IVPB 500 mg     500 mg 100 mL/hr over 60 Minutes Intravenous To Surgery 11/27/18 1040 12/01/18 0400         Subjective: No complaints.   Asking me to help change channel.  Objective: Vitals:   11/28/18 1953 11/29/18 0026 11/29/18 0536 11/29/18 0746  BP: 130/81 139/82 (!) 144/81 (!) 149/78  Pulse: 68 73 70 68  Resp:  '20 20 18 '$ (!) 25  Temp: 98.3 F (36.8 C) 98.3 F (36.8 C) 98.2 F (36.8 C) 98 F (36.7 C)  TempSrc: Oral Oral Oral Oral  SpO2: 94% 96% 95% 95%  Weight:   83.2 kg   Height:        Intake/Output Summary (Last 24 hours) at 11/29/2018 1310 Last data filed at 11/29/2018 1037 Gross per 24 hour  Intake 100 ml  Output --  Net 100 ml   Filed Weights   11/27/18 0413 11/28/18 0529 11/29/18 0536  Weight: 85.2 kg 84.2 kg 83.2 kg    Examination:  General: No acute distress. Cardiovascular: Heart sounds show Dacotah Cabello regular rate, and rhythm.  Lungs: Clear to auscultation bilaterally  Abdomen: Soft, nontender, nondistended  Neurological: Alert and oriented 3. Moves all extremities 4. Cranial nerves II through XII grossly intact. Skin: Warm and dry. No rashes or lesions. Extremities: No clubbing or cyanosis. No edema.   Data Reviewed: I have personally reviewed following labs and imaging studies  CBC: Recent Labs  Lab 11/23/18 2328  11/25/18 1318 11/26/18 0158 11/27/18 0253 11/28/18 0206 11/29/18 0400  WBC 21.1*   < > 17.0* 12.5* 11.0* 13.6* 14.8*  NEUTROABS 12.9*  --   --   --   --   --   --   HGB 16.6   < > 16.6 15.8 16.4 16.3 16.2  HCT 49.0   < > 47.6 46.7 46.2 47.6 47.6  MCV 102.1*   < > 97.5 97.9 96.3 96.7 97.7  PLT 395   < > 298 304 262 286 301   < > = values in this interval not displayed.   Basic Metabolic Panel: Recent Labs  Lab 11/24/18 0820 11/25/18 0834 11/26/18 0158 11/27/18 0253 11/28/18 0206 11/29/18 0400  NA  --  141 138 138 136 137  K  --  3.2* 3.3* 3.5 3.4* 3.6  CL  --  104 98 98 99 102  CO2  --  '26 28 26 25 22  '$ GLUCOSE  --  134* 136* 134* 149* 143*  BUN  --  24* 25* 25* 26* 32*  CREATININE  --  1.22 1.18 1.11 1.24 1.38*  CALCIUM  --  8.6* 8.8* 8.7* 8.8* 8.9  MG 2.0  --  1.9  --   --  2.0   GFR: Estimated Creatinine Clearance: 53.4 mL/min (Leshay Desaulniers) (by C-G formula based on SCr of 1.38 mg/dL (H)). Liver Function Tests: Recent Labs  Lab 11/23/18 2328  11/24/18 0327 11/25/18 0834 11/26/18 0158 11/29/18 0400  AST 45* 59* 33 24 25  ALT 30 38 '29 27 30  '$ ALKPHOS 89 80 70 71 75  BILITOT 2.2* 1.4* 1.3* 2.5*  2.0*  PROT 7.0 7.0 6.7 6.9 7.2  ALBUMIN 3.8 3.9 3.5 3.8 4.0   No results for input(s): LIPASE, AMYLASE in the last 168 hours. No results for input(s): AMMONIA in the last 168 hours. Coagulation Profile: No results for input(s): INR, PROTIME in the last 168 hours. Cardiac Enzymes: No results for input(s): CKTOTAL, CKMB, CKMBINDEX, TROPONINI in the last 168 hours. BNP (last 3 results) No results for input(s): PROBNP in the last 8760 hours. HbA1C: No results for input(s): HGBA1C in the last 72 hours. CBG: Recent Labs  Lab 11/23/18 2326 11/28/18 1802 11/28/18 2053 11/29/18 0627  GLUCAP 272* 109* 139* 158*   Lipid Profile: No results for input(s): CHOL, HDL, LDLCALC, TRIG, CHOLHDL, LDLDIRECT in the last 72 hours. Thyroid Function Tests: No results for input(s): TSH, T4TOTAL, FREET4, T3FREE, THYROIDAB in the last 72 hours. Anemia Panel: No results for input(s): VITAMINB12, FOLATE, FERRITIN, TIBC, IRON, RETICCTPCT in the last 72 hours. Sepsis Labs: Recent Labs  Lab 11/24/18 0327  PROCALCITON <0.10    Recent Results (from the past 240 hour(s))  SARS Coronavirus 2 [facility] order, Performed in [facility] hospital lab) Nasopharyngeal Nasopharyngeal Swab     Status: None   Collection Time: 11/23/18 11:29 PM   Specimen: Nasopharyngeal Swab  Result Value Ref Range Status   SARS Coronavirus 2 NEGATIVE NEGATIVE Final    Comment: (NOTE) If result is NEGATIVE SARS-CoV-2 target nucleic acids are NOT DETECTED. The SARS-CoV-2 RNA is generally detectable in upper and lower  respiratory specimens during the acute phase of infection. The lowest  concentration of SARS-CoV-2 viral copies this assay can detect is 250  copies / mL. Jasai Sorg negative result does not preclude SARS-CoV-2 infection  and should not be used as the sole basis for  treatment or other  patient management decisions.  Charissa Knowles negative result may occur with  improper specimen collection / handling, submission of specimen other  than nasopharyngeal swab, presence of viral mutation(s) within the  areas targeted by this assay, and inadequate number of viral copies  (<250 copies / mL). Deletha Jaffee negative result must be combined with clinical  observations, patient history, and epidemiological information. If result is POSITIVE SARS-CoV-2 target nucleic acids are DETECTED. The SARS-CoV-2 RNA is generally detectable in upper and lower  respiratory specimens dur ing the acute phase of infection.  Positive  results are indicative of active infection with SARS-CoV-2.  Clinical  correlation with patient history and other diagnostic information is  necessary to determine patient infection status.  Positive results do  not rule out bacterial infection or co-infection with other viruses. If result is PRESUMPTIVE POSTIVE SARS-CoV-2 nucleic acids MAY BE PRESENT.   Saanya Zieske presumptive positive result was obtained on the submitted specimen  and confirmed on repeat testing.  While 2019 novel coronavirus  (SARS-CoV-2) nucleic acids may be present in the submitted sample  additional confirmatory testing may be necessary for epidemiological  and / or clinical management purposes  to differentiate between  SARS-CoV-2 and other Sarbecovirus currently known to infect humans.  If clinically indicated additional testing with an alternate test  methodology [phone]) is advised. The SARS-CoV-2 RNA is generally  detectable in upper and lower respiratory sp ecimens during the acute  phase of infection. The expected result is Negative. Fact Sheet for Patients:  StrictlyIdeas.no Fact Sheet for Healthcare Providers: BankingDealers.co.za This test is not yet approved or cleared by the [place] FDA and has been authorized for detection and/or  diagnosis of SARS-CoV-2 by FDA under  an Emergency Use Authorization (EUA).  This EUA will remain in effect (meaning this test can be used) for the duration of the COVID-19 declaration under Section 564(b)(1) of the Act, 21 U.S.C. section 360bbb-3(b)(1), unless the authorization is terminated or revoked sooner. Performed at [facility], [place] [address] [place], [place] [address]   MRSA PCR Screening     Status: None   Collection Time: 11/25/18  7:54 PM   Specimen: Nasal Mucosa; Nasopharyngeal  Result Value Ref Range Status   MRSA by PCR NEGATIVE NEGATIVE Final    Comment:        The GeneXpert MRSA Assay (FDA approved for NASAL specimens only), is one component of Donn Wilmot comprehensive MRSA colonization surveillance program. It is not intended to diagnose MRSA infection nor to guide or monitor treatment for MRSA infections. Performed at [facility], [place] [address], [place], [place] [address]          Radiology Studies: Vas [place] Lower Extremity Saphenous Vein Mapping  Result Date: 11/29/2018 LOWER EXTREMITY VEIN MAPPING Indications:  Pre-op Risk Factors: Coronary artery disease.  Comparison Study: No prior study on file Performing Technologist: Sharion Dove RVS  Examination Guidelines: Bela Bonaparte complete evaluation includes B-mode imaging, spectral Doppler, color Doppler, and power Doppler as needed of all accessible portions of each vessel. Bilateral testing is considered an integral part of Emilene Roma complete examination. Limited examinations for reoccurring indications may be performed as noted. +---------------+-----------+----------------------+---------------+-----------+    RT Diameter   RT Findings          GSV             LT Diameter   LT Findings        (cm)                                               (cm)                    +---------------+-----------+----------------------+---------------+-----------+       0.56                       Saphenofemoral          0.56                                                         Junction                                     +---------------+-----------+----------------------+---------------+-----------+       0.52                       Proximal thigh          0.42        branching   +---------------+-----------+----------------------+---------------+-----------+       0.51        branching        Mid thigh             0.44                    +---------------+-----------+----------------------+---------------+-----------+  0.26                        Distal thigh           0.40        branching   +---------------+-----------+----------------------+---------------+-----------+       0.21                            Knee               0.40                    +---------------+-----------+----------------------+---------------+-----------+       0.23        branching        Prox calf             0.29                    +---------------+-----------+----------------------+---------------+-----------+       0.26                          Mid calf             0.38        branching   +---------------+-----------+----------------------+---------------+-----------+       0.26                        Distal calf            0.26        branching   +---------------+-----------+----------------------+---------------+-----------+       0.23        branching          Ankle               0.28                    +---------------+-----------+----------------------+---------------+-----------+    Preliminary         Scheduled Meds:  amiodarone  400 mg Oral BID   aspirin  81 mg Oral Daily   atorvastatin  80 mg Oral q1800   carvedilol  3.125 mg Oral BID WC   [START ON 11/30/2018] epinephrine  0-10 mcg/min Intravenous To OR   furosemide  40 mg Oral BID   [START ON 11/30/2018] heparin-papaverine-plasmalyte irrigation   Irrigation To OR   insulin aspart  0-5 Units Subcutaneous QHS   insulin aspart  0-9 Units Subcutaneous TID WC   [START ON 11/30/2018]  insulin   Intravenous To OR   [START ON 11/30/2018] [place] Blood Cardioplegia vial (lidocaine/magnesium/mannitol 0.26g-4g-6.4g)   Intracoronary Once   [START ON 11/30/2018] phenylephrine  0-100 mcg/min Intravenous To OR   [START ON 11/30/2018] potassium chloride  80 mEq Other To OR   sodium chloride flush  3 mL Intravenous Q12H   [START ON 11/30/2018] tranexamic acid  15 mg/kg Intravenous To OR   [START ON 11/30/2018] tranexamic acid  2 mg/kg Intracatheter To OR   [START ON 11/30/2018] vancomycin  1,000 mg Other To OR   Continuous Infusions:  sodium chloride     [START ON 11/30/2018] dexmedetomidine     [START ON 11/30/2018] DOBUTamine     [START ON 11/30/2018] DOPamine     [START ON 11/30/2018] fluconazole (DIFLUCAN) IV     [START ON 11/30/2018] heparin 30,000 units/NS 1000 mL solution for  CELLSAVER     heparin 1,450 Units/hr (11/28/18 2045)   [START ON 11/30/2018] levofloxacin (LEVAQUIN) IV     [START ON 11/30/2018] milrinone     nitroGLYCERIN     [START ON 11/30/2018] nitroGLYCERIN     [START ON 11/30/2018] norepinephrine (LEVOPHED) Adult infusion     [START ON 11/30/2018] rifampin (RIFADIN) IVPB     [START ON 11/30/2018] tranexamic acid (CYKLOKAPRON) infusion ([facility])     [START ON 11/30/2018] vancomycin     [START ON 11/30/2018] vasopressin (PITRESSIN) infusion - *FOR SHOCK*       LOS: 5 days    Time spent: over 30 min    Fayrene Helper, MD [facility] Pager AMION  If 7PM-7AM, please contact night-coverage [email or web address] Password [email or web address] 11/29/2018, 1:10 PM

## 2018-11-29 NOTE — Progress Notes (Signed)
     [address]       [place] [address]             [phone]       No events  Vitals:   11/29/18 0536 11/29/18 0746  BP: (!) 144/81 (!) 149/78  Pulse: 70 68  Resp: 18 (!) 25  Temp: 98.2 F (36.8 C) 98 F (36.7 C)  SpO2: 95% 95%   Alert NAD Sinus EWOB  Labs reviewed Carotids done  68 yo male CAD, MR OR tomorrow for CABG possible MVR Orders will be placed

## 2018-11-29 NOTE — Progress Notes (Signed)
ANTICOAGULATION CONSULT NOTE - Follow Up Consult  Pharmacy Consult for Heparin Indication: NSTEMI  Allergies  Allergen Reactions  . Penicillins Rash    Did it involve swelling of the face/tongue/throat, SOB, or low BP? Unknown Did it involve sudden or severe rash/hives, skin peeling, or any reaction on the inside of your mouth or nose? Yes Did you need to seek medical attention at a hospital or doctor's office? Unknown When did it last happen? childhood If all above answers are "NO", may proceed with cephalosporin use.     Patient Measurements: Height: 5\' 9"  (175.3 cm) Weight: 183 lb 6.8 oz (83.2 kg) IBW/kg (Calculated) : 70.7 Heparin Dosing Weight:  88.6 kg  Vital Signs: Temp: 98 F (36.7 C) (09/27 0746) Temp Source: Oral (09/27 0746) BP: 149/78 (09/27 0746) Pulse Rate: 68 (09/27 0746)  Labs: Recent Labs    11/27/18 0253 11/28/18 0206 11/29/18 0400  HGB 16.4 16.3 16.2  HCT 46.2 47.6 47.6  PLT 262 286 301  HEPARINUNFRC 0.43 0.40 0.71*  CREATININE 1.11 1.24 1.38*    Estimated Creatinine Clearance: 53.4 mL/min (A) (by C-G formula based on SCr of 1.38 mg/dL (H)).   Assessment: ACS. Heparin resumed post-cath 9/22. Cath: EF 25%. Severe multivessel CAD. TCTS consulted for CABG evaluation and plan is for tomorrow. No anticoagulation pta.   Heparin level today is supratherapeutic at 0.71 on 1450 units/hour. H&H is stable at 16.2/47.6, plts are wnl   Goal of Therapy:  Heparin level 0.3-0.7 units/ml Monitor platelets by anticoagulation protocol: Yes   Plan:  Decrease heparin infusion to 1350 units/hr.  6 hour confirmatory heparin level  Daily Heparin level and CBC while on therapy.  CABG +/- MVR on Monday F/u change to oral anticoagulant   Thank you,   Eddie Candle, PharmD PGY-1 Pharmacy Resident   Please check amion for clinical pharmacist contact number

## 2018-11-29 NOTE — Progress Notes (Signed)
Reviewed consent for surgery and blood with patient and signed.   Patient watched Heart surgery this afternoon.  Pt's response was it was pretty much what he thought it was going to be.  Patient denies any questions at this time.

## 2018-11-29 NOTE — Progress Notes (Signed)
ANTICOAGULATION CONSULT NOTE - Follow Up Consult  Pharmacy Consult for Heparin Indication: NSTEMI  Allergies  Allergen Reactions  . Penicillins Rash    Did it involve swelling of the face/tongue/throat, SOB, or low BP? Unknown Did it involve sudden or severe rash/hives, skin peeling, or any reaction on the inside of your mouth or nose? Yes Did you need to seek medical attention at a hospital or doctor's office? Unknown When did it last happen? childhood If all above answers are "NO", may proceed with cephalosporin use.     Patient Measurements: Height: 5\' 9"  (175.3 cm) Weight: 183 lb 6.8 oz (83.2 kg) IBW/kg (Calculated) : 70.7 Heparin Dosing Weight:  88.6 kg  Vital Signs: Temp: 97.6 F (36.4 C) (09/27 1651) Temp Source: Oral (09/27 1651) BP: 112/100 (09/27 1651) Pulse Rate: 75 (09/27 1651)  Labs: Recent Labs    11/27/18 0253 11/28/18 0206 11/29/18 0400 11/29/18 1754  HGB 16.4 16.3 16.2  --   HCT 46.2 47.6 47.6  --   PLT 262 286 301  --   HEPARINUNFRC 0.43 0.40 0.71* 0.42  CREATININE 1.11 1.24 1.38*  --     Estimated Creatinine Clearance: 53.4 mL/min (A) (by C-G formula based on SCr of 1.38 mg/dL (H)).   Assessment: ACS. Heparin resumed post-cath 9/22. Cath: EF 25%. Severe multivessel CAD. TCTS consulted for CABG evaluation and plan is for tomorrow. No anticoagulation pta.   Repeat heparin level is therapeutic at 0.4.   Goal of Therapy:  Heparin level 0.3-0.7 units/ml Monitor platelets by anticoagulation protocol: Yes   Plan:  -Continue heparin 1350 units/hr -Daily heparin level and CBC   Arrie Senate, PharmD, BCPS Clinical Pharmacist [phone] Please check AMION for all [facility] Pharmacy numbers 11/29/2018

## 2018-11-29 NOTE — Progress Notes (Signed)
Progress Note  Patient Name: Curtis [name] Date of Encounter: 11/29/2018  Primary Cardiologist: Chrystie NoseKenneth C Hilty, MD   Subjective   Denies angina and dyspnea. Wants to "get it over with".  Inpatient Medications    Scheduled Meds: . amiodarone  400 mg Oral BID  . aspirin  81 mg Oral Daily  . atorvastatin  80 mg Oral q1800  . carvedilol  3.125 mg Oral BID WC  . [START ON 11/30/2018] epinephrine  0-10 mcg/min Intravenous To OR  . furosemide  40 mg Oral BID  . [START ON 11/30/2018] heparin-papaverine-plasmalyte irrigation   Irrigation To OR  . insulin aspart  0-5 Units Subcutaneous QHS  . insulin aspart  0-9 Units Subcutaneous TID WC  . [START ON 11/30/2018] insulin   Intravenous To OR  . [START ON 11/30/2018] [place] Blood Cardioplegia vial (lidocaine/magnesium/mannitol 0.26g-4g-6.4g)   Intracoronary Once  . [START ON 11/30/2018] phenylephrine  0-100 mcg/min Intravenous To OR  . [START ON 11/30/2018] potassium chloride  80 mEq Other To OR  . sodium chloride flush  3 mL Intravenous Q12H  . [START ON 11/30/2018] tranexamic acid  15 mg/kg Intravenous To OR  . [START ON 11/30/2018] tranexamic acid  2 mg/kg Intracatheter To OR  . [START ON 11/30/2018] vancomycin  1,000 mg Other To OR   Continuous Infusions: . sodium chloride    . [START ON 11/30/2018] dexmedetomidine    . [START ON 11/30/2018] DOBUTamine    . [START ON 11/30/2018] DOPamine    . [START ON 11/30/2018] fluconazole (DIFLUCAN) IV    . [START ON 11/30/2018] heparin 30,000 units/NS 1000 mL solution for CELLSAVER    . heparin 1,450 Units/hr (11/28/18 2045)  . [START ON 11/30/2018] levofloxacin (LEVAQUIN) IV    . [START ON 11/30/2018] milrinone    . nitroGLYCERIN    . [START ON 11/30/2018] nitroGLYCERIN    . [START ON 11/30/2018] norepinephrine (LEVOPHED) Adult infusion    . [START ON 11/30/2018] rifampin (RIFADIN) IVPB    . [START ON 11/30/2018] tranexamic acid (CYKLOKAPRON) infusion ([facility])    . [START ON 11/30/2018] vancomycin    .  [START ON 11/30/2018] vasopressin (PITRESSIN) infusion - *FOR SHOCK*     PRN Meds: sodium chloride, acetaminophen, ondansetron (ZOFRAN) IV, sodium chloride flush   Vital Signs    Vitals:   11/28/18 1953 11/29/18 0026 11/29/18 0536 11/29/18 0746  BP: 130/81 139/82 (!) 144/81 (!) 149/78  Pulse: 68 73 70 68  Resp: 20 20 18  (!) 25  Temp: 98.3 F (36.8 C) 98.3 F (36.8 C) 98.2 F (36.8 C) 98 F (36.7 C)  TempSrc: Oral Oral Oral Oral  SpO2: 94% 96% 95% 95%  Weight:   83.2 kg   Height:        Intake/Output Summary (Last 24 hours) at 11/29/2018 1130 Last data filed at 11/29/2018 1037 Gross per 24 hour  Intake 100 ml  Output 400 ml  Net -300 ml   Last 3 Weights 11/29/2018 11/28/2018 11/27/2018  Weight (lbs) 183 lb 6.8 oz 185 lb 9.6 oz 187 lb 13.3 oz  Weight (kg) 83.2 kg 84.188 kg 85.2 kg      Telemetry    NSR - Personally Reviewed  ECG    No new tracing  - Personally Reviewed  Physical Exam  Lying fully supine, looks comfortable GEN: No acute distress.   Neck: No JVD Cardiac: RRR, 1-2/6 apical holosystolic murmurno diastolic murmurs, rubs, or gallops.  Respiratory: Clear to auscultation bilaterally. GI: Soft, nontender, non-distended  MS: No edema; No deformity. Neuro:  Nonfocal  Psych: Normal affect   Labs    High Sensitivity Troponin:   Recent Labs  Lab 11/23/18 2328 11/24/18 0327 11/24/18 0820  TROPONINIHS 63* 4,477* 9,016*      Chemistry Recent Labs  Lab 11/25/18 0834 11/26/18 0158 11/27/18 0253 11/28/18 0206 11/29/18 0400  NA 141 138 138 136 137  K 3.2* 3.3* 3.5 3.4* 3.6  CL 104 98 98 99 102  CO2 26 28 26 25 22   GLUCOSE 134* 136* 134* 149* 143*  BUN 24* 25* 25* 26* 32*  CREATININE 1.22 1.18 1.11 1.24 1.38*  CALCIUM 8.6* 8.8* 8.7* 8.8* 8.9  PROT 6.7 6.9  --   --  7.2  ALBUMIN 3.5 3.8  --   --  4.0  AST 33 24  --   --  25  ALT 29 27  --   --  30  ALKPHOS 70 71  --   --  75  BILITOT 1.3* 2.5*  --   --  2.0*  GFRNONAA >60 >60 >60 >60 53*   GFRAA >60 >60 >60 >60 >60  ANIONGAP 11 12 14 12 13      Hematology Recent Labs  Lab 11/27/18 0253 11/28/18 0206 11/29/18 0400  WBC 11.0* 13.6* 14.8*  RBC 4.80 4.92 4.87  HGB 16.4 16.3 16.2  HCT 46.2 47.6 47.6  MCV 96.3 96.7 97.7  MCH 34.2* 33.1 33.3  MCHC 35.5 34.2 34.0  RDW 12.0 12.0 12.0  PLT 262 286 301    BNP Recent Labs  Lab 11/23/18 2328  BNP 507.9*     DDimer No results for input(s): DDIMER in the last 168 hours.   Radiology    No results found.  Cardiac Studies   TEE 11/26/2018 1. Left ventricular ejection fraction, by visual estimation, is 25 to 30%. The left ventricle has severely decreased function. Normal left ventricular size. There is no left ventricular hypertrophy. 2. Global right ventricle has normal systolic function.The right ventricular size is normal. No increase in right ventricular wall thickness. 3. Left atrial size was moderately dilated. 4. Right atrial size was normal. 5. The mitral valve is normal in structure. Moderate mitral valve regurgitation. No evidence of mitral stenosis. 6. PISA radius - 0.6cm ERO - 0.17 cm2 MR regugitant volume - 44ml Vena contracta - 0.5 cm. 7. The tricuspid valve is normal in structure. Tricuspid valve regurgitation was not visualized by color flow Doppler. 8. The aortic valve is tricuspid Aortic valve regurgitation is mild by color flow Doppler. Structurally normal aortic valve, with no evidence of sclerosis or stenosis. 9. Vena contracta of AR jet is 0.2cm. 10. The pulmonic valve was normal in structure. Pulmonic valve regurgitation is trivial by color flow Doppler. 11. The inferior vena cava is normal in size with greater than 50% respiratory variability, suggesting right atrial pressure of 3 mmHg.  Cardiac cath 11/24/2018  Acute systolic heart failure with pulmonary edema, echo LVEF 25%, and normal LVEDP after diuresis. Assume that the patient has stunned myocardium (No symptoms  prior to presentation).  Severe multivessel coronary disease with 40% tubular left main.   50 followed by 60 to 70% proximal LAD tandem lesions. Severe 80% diffuse disease in the large first diagonal.   The circumflex contains multifocal high-grade stenoses in the mid body and distal before the origin of the left PDA. The large third obtuse marginal contains 99% stenosis.   The right coronary is codominant and is totally occluded with  collaterals filling from left to right and right to right.  RECOMMENDATIONS:   After stabilization, the patient should be considered for surgical revascularization if targets are adequate. TCTS consultation is recommended but because of the late completion of the procedure the office was closed. Please consult in a.m.  Patient Profile     68 y.o. male  with NSTEMI, severe ischemic cardiomyopathy and acute systolic heart failure/pulmonary edema, moderate mitral regurgitation in the setting of multivessel CAD  Assessment & Plan    1. CHF: Appears clinically euvolemic.  EF 25%.   2. CAD: for CABG tomorrow.  No angina at this time. 3. MR: reevaluate by intraop TEE, but appears to be secondary, only moderate.   4. LBBB: Consider placing an epicardial left ventricular lead tunneled to left subclavian area, for possible need for biventricular pacing if LVEF does not recover. Intravenous amiodarone started due to high likelihood of postoperative atrial fibrillation.  No evidence of higher grade AV block or bradycardia so far. 5. HLP: Aggressive treatment of hypercholesterolemia (high intensity statin has been started).     For questions or updates, please contact [place] Please consult [email or web address] for contact info under        Signed, Sanda Klein, MD  11/29/2018, 11:30 AM

## 2018-11-30 ENCOUNTER — Encounter (HOSPITAL_COMMUNITY)
Admission: EM | Disposition: A | Discharge status: Rehabilitation Facility | Payer: Self-pay | Source: Home / Self Care | Attending: Cardiothoracic Surgery

## 2018-11-30 ENCOUNTER — Inpatient Hospital Stay (HOSPITAL_COMMUNITY): Payer: Medicare Other | Admitting: Certified Registered Nurse Anesthetist

## 2018-11-30 ENCOUNTER — Inpatient Hospital Stay (HOSPITAL_COMMUNITY): Payer: Medicare Other

## 2018-11-30 ENCOUNTER — Inpatient Hospital Stay (HOSPITAL_COMMUNITY): Payer: Medicare Other | Admitting: Anesthesiology

## 2018-11-30 DIAGNOSIS — I97611 Postprocedural hemorrhage and hematoma of a circulatory system organ or structure following cardiac bypass: Secondary | ICD-10-CM

## 2018-11-30 DIAGNOSIS — I340 Nonrheumatic mitral (valve) insufficiency: Secondary | ICD-10-CM

## 2018-11-30 DIAGNOSIS — I2510 Atherosclerotic heart disease of native coronary artery without angina pectoris: Secondary | ICD-10-CM

## 2018-11-30 DIAGNOSIS — Z951 Presence of aortocoronary bypass graft: Secondary | ICD-10-CM

## 2018-11-30 HISTORY — PX: MITRAL VALVE REPAIR: SHX2039

## 2018-11-30 HISTORY — PX: TEE WITHOUT CARDIOVERSION: SHX5443

## 2018-11-30 HISTORY — PX: PLACEMENT OF IMPELLA LEFT VENTRICULAR ASSIST DEVICE: SHX6519

## 2018-11-30 HISTORY — PX: CORONARY ARTERY BYPASS GRAFT: SHX141

## 2018-11-30 LAB — POCT I-STAT 7, (LYTES, BLD GAS, ICA,H+H)
Acid-Base Excess: 1 mmol/L (ref 0.0–2.0)
Acid-base deficit: 1 mmol/L (ref 0.0–2.0)
Acid-base deficit: 1 mmol/L (ref 0.0–2.0)
Acid-base deficit: 3 mmol/L — ABNORMAL HIGH (ref 0.0–2.0)
Acid-base deficit: 5 mmol/L — ABNORMAL HIGH (ref 0.0–2.0)
Bicarbonate: 23.8 mmol/L (ref 20.0–28.0)
Bicarbonate: 25.3 mmol/L (ref 20.0–28.0)
Bicarbonate: 23.5 mmol/L (ref 20.0–28.0)
Bicarbonate: 22.1 mmol/L (ref 20.0–28.0)
Bicarbonate: 20.3 mmol/L (ref 20.0–28.0)
Calcium, Ion: 1.17 mmol/L (ref 1.15–1.40)
Calcium, Ion: 0.95 mmol/L — ABNORMAL LOW (ref 1.15–1.40)
Calcium, Ion: 1.23 mmol/L (ref 1.15–1.40)
Calcium, Ion: 1.16 mmol/L (ref 1.15–1.40)
Calcium, Ion: 1.08 mmol/L — ABNORMAL LOW (ref 1.15–1.40)
HCT: 42 % (ref 39.0–52.0)
HCT: 32 % — ABNORMAL LOW (ref 39.0–52.0)
HCT: 27 % — ABNORMAL LOW (ref 39.0–52.0)
HCT: 23 % — ABNORMAL LOW (ref 39.0–52.0)
HCT: 20 % — ABNORMAL LOW (ref 39.0–52.0)
Hemoglobin: 14.3 g/dL (ref 13.0–17.0)
Hemoglobin: 10.9 g/dL — ABNORMAL LOW (ref 13.0–17.0)
Hemoglobin: 9.2 g/dL — ABNORMAL LOW (ref 13.0–17.0)
Hemoglobin: 7.8 g/dL — ABNORMAL LOW (ref 13.0–17.0)
Hemoglobin: 6.8 g/dL — CL (ref 13.0–17.0)
O2 Saturation: 99 %
O2 Saturation: 100 %
O2 Saturation: 95 %
O2 Saturation: 92 %
O2 Saturation: 94 %
Patient temperature: 36.3
Patient temperature: 35.9
Potassium: 3.5 mmol/L (ref 3.5–5.1)
Potassium: 3.6 mmol/L (ref 3.5–5.1)
Potassium: 3.9 mmol/L (ref 3.5–5.1)
Potassium: 4.1 mmol/L (ref 3.5–5.1)
Potassium: 4.3 mmol/L (ref 3.5–5.1)
Sodium: 138 mmol/L (ref 135–145)
Sodium: 140 mmol/L (ref 135–145)
Sodium: 139 mmol/L (ref 135–145)
Sodium: 139 mmol/L (ref 135–145)
Sodium: 141 mmol/L (ref 135–145)
TCO2: 25 mmol/L (ref 22–32)
TCO2: 26 mmol/L (ref 22–32)
TCO2: 25 mmol/L (ref 22–32)
TCO2: 23 mmol/L (ref 22–32)
TCO2: 21 mmol/L — ABNORMAL LOW (ref 22–32)
pCO2 arterial: 40.1 mmHg (ref 32.0–48.0)
pCO2 arterial: 38.3 mmHg (ref 32.0–48.0)
pCO2 arterial: 39.1 mmHg (ref 32.0–48.0)
pCO2 arterial: 36.7 mmHg (ref 32.0–48.0)
pCO2 arterial: 36.1 mmHg (ref 32.0–48.0)
pH, Arterial: 7.381 (ref 7.350–7.450)
pH, Arterial: 7.428 (ref 7.350–7.450)
pH, Arterial: 7.386 (ref 7.350–7.450)
pH, Arterial: 7.386 (ref 7.350–7.450)
pH, Arterial: 7.352 (ref 7.350–7.450)
pO2, Arterial: 169 mmHg — ABNORMAL HIGH (ref 83.0–108.0)
pO2, Arterial: 331 mmHg — ABNORMAL HIGH (ref 83.0–108.0)
pO2, Arterial: 76 mmHg — ABNORMAL LOW (ref 83.0–108.0)
pO2, Arterial: 63 mmHg — ABNORMAL LOW (ref 83.0–108.0)
pO2, Arterial: 69 mmHg — ABNORMAL LOW (ref 83.0–108.0)

## 2018-11-30 LAB — DIC (DISSEMINATED INTRAVASCULAR COAGULATION)PANEL
D-Dimer, Quant: 0.36 ug/mL-FEU (ref 0.00–0.50)
Fibrinogen: 204 mg/dL — ABNORMAL LOW (ref 210–475)
INR: 1.5 — ABNORMAL HIGH (ref 0.8–1.2)
Platelets: 74 10*3/uL — ABNORMAL LOW (ref 150–400)
Prothrombin Time: 17.9 seconds — ABNORMAL HIGH (ref 11.4–15.2)
Smear Review: NONE SEEN
aPTT: 42 seconds — ABNORMAL HIGH (ref 24–36)

## 2018-11-30 LAB — GLUCOSE, CAPILLARY
Glucose-Capillary: 136 mg/dL — ABNORMAL HIGH (ref 70–99)
Glucose-Capillary: 122 mg/dL — ABNORMAL HIGH (ref 70–99)
Glucose-Capillary: 126 mg/dL — ABNORMAL HIGH (ref 70–99)
Glucose-Capillary: 141 mg/dL — ABNORMAL HIGH (ref 70–99)
Glucose-Capillary: 138 mg/dL — ABNORMAL HIGH (ref 70–99)
Glucose-Capillary: 124 mg/dL — ABNORMAL HIGH (ref 70–99)
Glucose-Capillary: 113 mg/dL — ABNORMAL HIGH (ref 70–99)

## 2018-11-30 LAB — BASIC METABOLIC PANEL
Anion gap: 9 (ref 5–15)
BUN: 27 mg/dL — ABNORMAL HIGH (ref 8–23)
CO2: 21 mmol/L — ABNORMAL LOW (ref 22–32)
Calcium: 8.6 mg/dL — ABNORMAL LOW (ref 8.9–10.3)
Chloride: 109 mmol/L (ref 98–111)
Creatinine, Ser: 1.35 mg/dL — ABNORMAL HIGH (ref 0.61–1.24)
GFR calc Af Amer: >60 mL/min (ref >60)
GFR calc non Af Amer: 55 mL/min — ABNORMAL LOW (ref >60)
Glucose, Bld: 110 mg/dL — ABNORMAL HIGH (ref 70–99)
Potassium: 4.4 mmol/L (ref 3.5–5.1)
Sodium: 139 mmol/L (ref 135–145)

## 2018-11-30 LAB — CBC
HCT: 45 % (ref 39.0–52.0)
HCT: 25.7 % — ABNORMAL LOW (ref 39.0–52.0)
HCT: 20.6 % — ABNORMAL LOW (ref 39.0–52.0)
Hemoglobin: 16 g/dL (ref 13.0–17.0)
Hemoglobin: 8.5 g/dL — ABNORMAL LOW (ref 13.0–17.0)
Hemoglobin: 7.3 g/dL — ABNORMAL LOW (ref 13.0–17.0)
MCH: 34.4 pg — ABNORMAL HIGH (ref 26.0–34.0)
MCH: 33.5 pg (ref 26.0–34.0)
MCH: 32.7 pg (ref 26.0–34.0)
MCHC: 35.6 g/dL (ref 30.0–36.0)
MCHC: 33.1 g/dL (ref 30.0–36.0)
MCHC: 35.4 g/dL (ref 30.0–36.0)
MCV: 96.8 fL (ref 80.0–100.0)
MCV: 101.2 fL — ABNORMAL HIGH (ref 80.0–100.0)
MCV: 92.4 fL (ref 80.0–100.0)
Platelets: 246 10*3/uL (ref 150–400)
Platelets: 127 10*3/uL — ABNORMAL LOW (ref 150–400)
Platelets: 117 10*3/uL — ABNORMAL LOW (ref 150–400)
RBC: 4.65 MIL/uL (ref 4.22–5.81)
RBC: 2.54 MIL/uL — ABNORMAL LOW (ref 4.22–5.81)
RBC: 2.23 MIL/uL — ABNORMAL LOW (ref 4.22–5.81)
RDW: 12.2 % (ref 11.5–15.5)
RDW: 12.2 % (ref 11.5–15.5)
RDW: 16.1 % — ABNORMAL HIGH (ref 11.5–15.5)
WBC: 12.7 10*3/uL — ABNORMAL HIGH (ref 4.0–10.5)
WBC: 24.1 10*3/uL — ABNORMAL HIGH (ref 4.0–10.5)
WBC: 17 10*3/uL — ABNORMAL HIGH (ref 4.0–10.5)
nRBC: 0 % (ref 0.0–0.2)
nRBC: 0 % (ref 0.0–0.2)
nRBC: 0 % (ref 0.0–0.2)

## 2018-11-30 LAB — POCT I-STAT, CHEM 8
BUN: 30 mg/dL — ABNORMAL HIGH (ref 8–23)
BUN: 31 mg/dL — ABNORMAL HIGH (ref 8–23)
BUN: 31 mg/dL — ABNORMAL HIGH (ref 8–23)
BUN: 30 mg/dL — ABNORMAL HIGH (ref 8–23)
BUN: 33 mg/dL — ABNORMAL HIGH (ref 8–23)
BUN: 33 mg/dL — ABNORMAL HIGH (ref 8–23)
Calcium, Ion: 1.19 mmol/L (ref 1.15–1.40)
Calcium, Ion: 1.16 mmol/L (ref 1.15–1.40)
Calcium, Ion: 1.03 mmol/L — ABNORMAL LOW (ref 1.15–1.40)
Calcium, Ion: 1.02 mmol/L — ABNORMAL LOW (ref 1.15–1.40)
Calcium, Ion: 1.19 mmol/L (ref 1.15–1.40)
Calcium, Ion: 1.2 mmol/L (ref 1.15–1.40)
Chloride: 102 mmol/L (ref 98–111)
Chloride: 103 mmol/L (ref 98–111)
Chloride: 98 mmol/L (ref 98–111)
Chloride: 100 mmol/L (ref 98–111)
Chloride: 102 mmol/L (ref 98–111)
Chloride: 101 mmol/L (ref 98–111)
Creatinine, Ser: 1.2 mg/dL (ref 0.61–1.24)
Creatinine, Ser: 1.2 mg/dL (ref 0.61–1.24)
Creatinine, Ser: 1.1 mg/dL (ref 0.61–1.24)
Creatinine, Ser: 1 mg/dL (ref 0.61–1.24)
Creatinine, Ser: 1.1 mg/dL (ref 0.61–1.24)
Creatinine, Ser: 1.3 mg/dL — ABNORMAL HIGH (ref 0.61–1.24)
Glucose, Bld: 139 mg/dL — ABNORMAL HIGH (ref 70–99)
Glucose, Bld: 144 mg/dL — ABNORMAL HIGH (ref 70–99)
Glucose, Bld: 104 mg/dL — ABNORMAL HIGH (ref 70–99)
Glucose, Bld: 117 mg/dL — ABNORMAL HIGH (ref 70–99)
Glucose, Bld: 144 mg/dL — ABNORMAL HIGH (ref 70–99)
Glucose, Bld: 146 mg/dL — ABNORMAL HIGH (ref 70–99)
HCT: 44 % (ref 39.0–52.0)
HCT: 42 % (ref 39.0–52.0)
HCT: 31 % — ABNORMAL LOW (ref 39.0–52.0)
HCT: 28 % — ABNORMAL LOW (ref 39.0–52.0)
HCT: 26 % — ABNORMAL LOW (ref 39.0–52.0)
HCT: 31 % — ABNORMAL LOW (ref 39.0–52.0)
Hemoglobin: 15 g/dL (ref 13.0–17.0)
Hemoglobin: 14.3 g/dL (ref 13.0–17.0)
Hemoglobin: 10.5 g/dL — ABNORMAL LOW (ref 13.0–17.0)
Hemoglobin: 9.5 g/dL — ABNORMAL LOW (ref 13.0–17.0)
Hemoglobin: 8.8 g/dL — ABNORMAL LOW (ref 13.0–17.0)
Hemoglobin: 10.5 g/dL — ABNORMAL LOW (ref 13.0–17.0)
Potassium: 3.5 mmol/L (ref 3.5–5.1)
Potassium: 3.7 mmol/L (ref 3.5–5.1)
Potassium: 4.1 mmol/L (ref 3.5–5.1)
Potassium: 4.2 mmol/L (ref 3.5–5.1)
Potassium: 4.5 mmol/L (ref 3.5–5.1)
Potassium: 3.9 mmol/L (ref 3.5–5.1)
Sodium: 139 mmol/L (ref 135–145)
Sodium: 139 mmol/L (ref 135–145)
Sodium: 135 mmol/L (ref 135–145)
Sodium: 136 mmol/L (ref 135–145)
Sodium: 137 mmol/L (ref 135–145)
Sodium: 139 mmol/L (ref 135–145)
TCO2: 24 mmol/L (ref 22–32)
TCO2: 25 mmol/L (ref 22–32)
TCO2: 26 mmol/L (ref 22–32)
TCO2: 28 mmol/L (ref 22–32)
TCO2: 25 mmol/L (ref 22–32)
TCO2: 23 mmol/L (ref 22–32)

## 2018-11-30 LAB — FIBRINOGEN
Fibrinogen: 198 mg/dL — ABNORMAL LOW (ref 210–475)
Fibrinogen: 208 mg/dL — ABNORMAL LOW (ref 210–475)

## 2018-11-30 LAB — COMPREHENSIVE METABOLIC PANEL
ALT: 37 U/L (ref 0–44)
AST: 30 U/L (ref 15–41)
Albumin: 3.9 g/dL (ref 3.5–5.0)
Alkaline Phosphatase: 78 U/L (ref 38–126)
Anion gap: 13 (ref 5–15)
BUN: 31 mg/dL — ABNORMAL HIGH (ref 8–23)
CO2: 23 mmol/L (ref 22–32)
Calcium: 8.9 mg/dL (ref 8.9–10.3)
Chloride: 103 mmol/L (ref 98–111)
Creatinine, Ser: 1.45 mg/dL — ABNORMAL HIGH (ref 0.61–1.24)
GFR calc Af Amer: 58 mL/min — ABNORMAL LOW (ref >60)
GFR calc non Af Amer: 50 mL/min — ABNORMAL LOW (ref >60)
Glucose, Bld: 153 mg/dL — ABNORMAL HIGH (ref 70–99)
Potassium: 3.5 mmol/L (ref 3.5–5.1)
Sodium: 139 mmol/L (ref 135–145)
Total Bilirubin: 2.3 mg/dL — ABNORMAL HIGH (ref 0.3–1.2)
Total Protein: 6.9 g/dL (ref 6.5–8.1)

## 2018-11-30 LAB — PREPARE RBC (CROSSMATCH)

## 2018-11-30 LAB — PROTIME-INR
INR: 1.8 — ABNORMAL HIGH (ref 0.8–1.2)
INR: 1.7 — ABNORMAL HIGH (ref 0.8–1.2)
Prothrombin Time: 20.3 seconds — ABNORMAL HIGH (ref 11.4–15.2)
Prothrombin Time: 19.4 seconds — ABNORMAL HIGH (ref 11.4–15.2)

## 2018-11-30 LAB — ECHO INTRAOPERATIVE TEE
Height: 69 in — NL
MV Vena cont: 0.6 cm — NL
Weight: 2944 oz — NL

## 2018-11-30 LAB — HEMOGLOBIN AND HEMATOCRIT, BLOOD
HCT: 28.4 % — ABNORMAL LOW (ref 39.0–52.0)
Hemoglobin: 9.9 g/dL — ABNORMAL LOW (ref 13.0–17.0)

## 2018-11-30 LAB — MAGNESIUM
Magnesium: 2 mg/dL (ref 1.7–2.4)
Magnesium: 3.2 mg/dL — ABNORMAL HIGH (ref 1.7–2.4)

## 2018-11-30 LAB — APTT
aPTT: 44 seconds — ABNORMAL HIGH (ref 24–36)
aPTT: 51 seconds — ABNORMAL HIGH (ref 24–36)

## 2018-11-30 LAB — PLATELET COUNT: Platelets: 139 10*3/uL — ABNORMAL LOW (ref 150–400)

## 2018-11-30 LAB — LACTATE DEHYDROGENASE: LDH: 253 U/L — ABNORMAL HIGH (ref 98–192)

## 2018-11-30 LAB — HEPARIN LEVEL (UNFRACTIONATED): Heparin Unfractionated: 0.63 IU/mL (ref 0.30–0.70)

## 2018-11-30 SURGERY — CORONARY ARTERY BYPASS GRAFTING (CABG)
Anesthesia: General | Site: Chest

## 2018-11-30 MED ORDER — CHLORHEXIDINE GLUCONATE 0.12% ORAL RINSE (MEDLINE KIT)
15.0000 mL | Freq: Two times a day (BID) | OROMUCOSAL | Status: DC
  Administered 2018-12-01 – 2018-12-05 (×8): 15 mL via OROMUCOSAL

## 2018-11-30 MED ORDER — PHENYLEPHRINE HCL-NACL 20-0.9 MG/250ML-% IV SOLN
0.0000 ug/min | INTRAVENOUS | Status: DC
  Administered 2018-11-30: 100 ug/min via INTRAVENOUS
  Administered 2018-12-05: 20 ug/min via INTRAVENOUS
  Filled 2018-11-30 (×3): qty 250

## 2018-11-30 MED ORDER — SODIUM CHLORIDE 0.9 % IV SOLN
20.0000 ug | Freq: Once | INTRAVENOUS | Status: AC
  Administered 2018-11-30: 20 ug via INTRAVENOUS
  Filled 2018-11-30: qty 5

## 2018-11-30 MED ORDER — NOREPINEPHRINE 4 MG/250ML-% IV SOLN
0.0000 ug/min | INTRAVENOUS | Status: DC
  Administered 2018-11-30: 19:00:00 2 ug/min via INTRAVENOUS
  Administered 2018-12-01: 23:00:00 4 ug/min via INTRAVENOUS

## 2018-11-30 MED ORDER — ROCURONIUM BROMIDE 10 MG/ML (PF) SYRINGE
PREFILLED_SYRINGE | INTRAVENOUS | Status: AC
  Filled 2018-11-30: qty 10

## 2018-11-30 MED ORDER — ACETAMINOPHEN 650 MG RE SUPP: 650.0000 mg | Freq: Once | RECTAL | Status: AC

## 2018-11-30 MED ORDER — FAMOTIDINE IN NACL 20-0.9 MG/50ML-% IV SOLN
20.0000 mg | Freq: Two times a day (BID) | INTRAVENOUS | Status: AC
  Administered 2018-11-30 – 2018-12-01 (×2): 20 mg via INTRAVENOUS
  Filled 2018-11-30: qty 50

## 2018-11-30 MED ORDER — ACETAMINOPHEN 160 MG/5ML PO SOLN
1000.0000 mg | Freq: Four times a day (QID) | ORAL | Status: AC
  Administered 2018-12-01 – 2018-12-05 (×7): 1000 mg
  Filled 2018-11-30 (×7): qty 40.6

## 2018-11-30 MED ORDER — AMIODARONE HCL IN DEXTROSE 360-4.14 MG/200ML-% IV SOLN
30.0000 mg/h | INTRAVENOUS | Status: DC
  Administered 2018-12-01 – 2018-12-09 (×17): 30 mg/h via INTRAVENOUS
  Filled 2018-11-30 (×16): qty 200

## 2018-11-30 MED ORDER — LACTATED RINGERS IV SOLN
INTRAVENOUS | Status: DC | PRN
  Administered 2018-11-30: 07:00:00 via INTRAVENOUS

## 2018-11-30 MED ORDER — MILRINONE LACTATE IN DEXTROSE 20-5 MG/100ML-% IV SOLN
INTRAVENOUS | Status: DC | PRN
  Administered 2018-11-30: 0.25 ug/kg/min via INTRAVENOUS

## 2018-11-30 MED ORDER — ALBUMIN HUMAN 5 % IV SOLN
12.5000 g | Freq: Once | INTRAVENOUS | Status: AC
  Administered 2018-11-30: 20:00:00 12.5 g via INTRAVENOUS

## 2018-11-30 MED ORDER — ONDANSETRON HCL 4 MG/2ML IJ SOLN: 4.0000 mg | Freq: Four times a day (QID) | INTRAMUSCULAR | Status: DC | PRN

## 2018-11-30 MED ORDER — LACTATED RINGERS IV SOLN
INTRAVENOUS | Status: DC | PRN
  Administered 2018-11-30 (×2): via INTRAVENOUS

## 2018-11-30 MED ORDER — PLASMA-LYTE 148 IV SOLN
INTRAVENOUS | Status: DC | PRN
  Administered 2018-11-30: 07:00:00 500 mL via INTRAVASCULAR

## 2018-11-30 MED ORDER — 0.9 % SODIUM CHLORIDE (POUR BTL) OPTIME
TOPICAL | Status: DC | PRN
  Administered 2018-11-30: 5000 mL

## 2018-11-30 MED ORDER — SODIUM CHLORIDE 0.9% FLUSH: 3.0000 mL | INTRAVENOUS | Status: DC | PRN

## 2018-11-30 MED ORDER — PROPOFOL 10 MG/ML IV BOLUS
INTRAVENOUS | Status: AC
  Filled 2018-11-30: qty 20

## 2018-11-30 MED ORDER — NON FORMULARY
Status: DC | PRN
  Administered 2018-11-30: 30 mL

## 2018-11-30 MED ORDER — FENTANYL CITRATE (PF) 250 MCG/5ML IJ SOLN
INTRAMUSCULAR | Status: DC | PRN
  Administered 2018-11-30: 100 ug via INTRAVENOUS
  Administered 2018-11-30: 50 ug via INTRAVENOUS
  Administered 2018-11-30 (×5): 100 ug via INTRAVENOUS

## 2018-11-30 MED ORDER — ALBUMIN HUMAN 5 % IV SOLN
INTRAVENOUS | Status: AC
  Filled 2018-11-30: qty 250

## 2018-11-30 MED ORDER — ALBUMIN HUMAN 5 % IV SOLN
250.0000 mL | INTRAVENOUS | Status: AC | PRN
  Administered 2018-11-30 (×4): 12.5 g via INTRAVENOUS
  Filled 2018-11-30: qty 500

## 2018-11-30 MED ORDER — LACTATED RINGERS IV SOLN
500.0000 mL | Freq: Once | INTRAVENOUS | Status: AC | PRN
  Administered 2018-11-30: 500 mL via INTRAVENOUS

## 2018-11-30 MED ORDER — DEXTROSE 5 % SOLN FOR IMPELLA PURGE CATHETER
INTRAVENOUS | Status: DC
  Filled 2018-11-30: qty 1000

## 2018-11-30 MED ORDER — INSULIN REGULAR(HUMAN) IN NACL 100-0.9 UT/100ML-% IV SOLN
INTRAVENOUS | Status: DC
  Administered 2018-12-01: 2.4 [IU]/h via INTRAVENOUS
  Filled 2018-11-30: qty 100

## 2018-11-30 MED ORDER — ROCURONIUM BROMIDE 10 MG/ML (PF) SYRINGE
PREFILLED_SYRINGE | INTRAVENOUS | Status: AC
  Filled 2018-11-30: qty 30

## 2018-11-30 MED ORDER — EPINEPHRINE HCL 5 MG/250ML IV SOLN IN NS
0.0000 ug/min | INTRAVENOUS | Status: DC
  Administered 2018-12-01 – 2018-12-02 (×2): 3 ug/min via INTRAVENOUS
  Filled 2018-11-30 (×2): qty 250

## 2018-11-30 MED ORDER — INSULIN REGULAR BOLUS VIA INFUSION
0.0000 [IU] | Freq: Three times a day (TID) | INTRAVENOUS | Status: DC
  Filled 2018-11-30: qty 10

## 2018-11-30 MED ORDER — FENTANYL CITRATE (PF) 250 MCG/5ML IJ SOLN
INTRAMUSCULAR | Status: AC
  Filled 2018-11-30: qty 5

## 2018-11-30 MED ORDER — STERILE WATER FOR INJECTION IJ SOLN
INTRAMUSCULAR | Status: AC
  Filled 2018-11-30: qty 10

## 2018-11-30 MED ORDER — PHENYLEPHRINE 40 MCG/ML (10ML) SYRINGE FOR IV PUSH (FOR BLOOD PRESSURE SUPPORT)
PREFILLED_SYRINGE | INTRAVENOUS | Status: DC | PRN
  Administered 2018-11-30 (×2): 80 ug via INTRAVENOUS

## 2018-11-30 MED ORDER — METOPROLOL TARTRATE 12.5 MG HALF TABLET: 12.5000 mg | ORAL_TABLET | Freq: Two times a day (BID) | ORAL | Status: DC

## 2018-11-30 MED ORDER — DEXMEDETOMIDINE HCL IN NACL 200 MCG/50ML IV SOLN: 0.0000 ug/kg/h | INTRAVENOUS | Status: DC

## 2018-11-30 MED ORDER — BISACODYL 10 MG RE SUPP
10.0000 mg | Freq: Every day | RECTAL | Status: DC
  Filled 2018-11-30: qty 1

## 2018-11-30 MED ORDER — DOCUSATE SODIUM 100 MG PO CAPS
200.0000 mg | ORAL_CAPSULE | Freq: Every day | ORAL | Status: DC
  Administered 2018-12-02 – 2018-12-10 (×7): 200 mg via ORAL
  Filled 2018-11-30 (×8): qty 2

## 2018-11-30 MED ORDER — LACTATED RINGERS IV SOLN
INTRAVENOUS | Status: DC
  Administered 2018-12-01 – 2018-12-04 (×5): via INTRAVENOUS

## 2018-11-30 MED ORDER — LACTATED RINGERS IV SOLN
INTRAVENOUS | Status: DC
  Administered 2018-11-30: 21:00:00 via INTRAVENOUS

## 2018-11-30 MED ORDER — SODIUM CHLORIDE (PF) 0.9 % IJ SOLN
OROMUCOSAL | Status: DC | PRN
  Administered 2018-11-30 (×3): 4 mL via TOPICAL

## 2018-11-30 MED ORDER — EPINEPHRINE PF 1 MG/ML IJ SOLN: 0.0000 ug/min | INTRAVENOUS | Status: DC

## 2018-11-30 MED ORDER — OXYCODONE HCL 5 MG PO TABS
5.0000 mg | ORAL_TABLET | ORAL | Status: DC | PRN
  Administered 2018-12-03: 5 mg via ORAL
  Filled 2018-11-30: qty 1

## 2018-11-30 MED ORDER — HEPARIN SODIUM (PORCINE) 1000 UNIT/ML IJ SOLN
INTRAMUSCULAR | Status: DC | PRN
  Administered 2018-11-30: 30000 [IU] via INTRAVENOUS

## 2018-11-30 MED ORDER — VASOPRESSIN 20 UNIT/ML IV SOLN
0.0400 [IU]/min | INTRAVENOUS | Status: DC
  Administered 2018-11-30: 18:00:00 0.04 [IU]/min via INTRAVENOUS
  Filled 2018-11-30 (×3): qty 2

## 2018-11-30 MED ORDER — PROPOFOL 1000 MG/100ML IV EMUL
INTRAVENOUS | Status: AC
  Filled 2018-11-30: qty 100

## 2018-11-30 MED ORDER — ACETAMINOPHEN 500 MG PO TABS
1000.0000 mg | ORAL_TABLET | Freq: Four times a day (QID) | ORAL | Status: AC
  Administered 2018-12-02 – 2018-12-03 (×3): 1000 mg via ORAL
  Filled 2018-11-30 (×4): qty 2

## 2018-11-30 MED ORDER — PROTAMINE SULFATE 10 MG/ML IV SOLN
INTRAVENOUS | Status: DC | PRN
  Administered 2018-11-30: 250 mg via INTRAVENOUS

## 2018-11-30 MED ORDER — FENTANYL CITRATE (PF) 250 MCG/5ML IJ SOLN
INTRAMUSCULAR | Status: AC
  Filled 2018-11-30: qty 25

## 2018-11-30 MED ORDER — TRAMADOL HCL 50 MG PO TABS: 50.0000 mg | ORAL_TABLET | ORAL | Status: DC | PRN

## 2018-11-30 MED ORDER — SODIUM CHLORIDE 0.9 % IV SOLN
250.0000 mL | INTRAVENOUS | Status: DC
  Administered 2018-11-30: 21:00:00 via INTRAVENOUS

## 2018-11-30 MED ORDER — ROCURONIUM BROMIDE 10 MG/ML (PF) SYRINGE
PREFILLED_SYRINGE | INTRAVENOUS | Status: AC
  Filled 2018-11-30: qty 20

## 2018-11-30 MED ORDER — CALCIUM CHLORIDE 10 % IV SOLN
INTRAVENOUS | Status: DC | PRN
  Administered 2018-11-30 (×3): 100 mg via INTRAVENOUS
  Administered 2018-11-30: 200 mg via INTRAVENOUS

## 2018-11-30 MED ORDER — 0.9 % SODIUM CHLORIDE (POUR BTL) OPTIME
TOPICAL | Status: DC | PRN
  Administered 2018-11-30: 10:00:00 1000 mL

## 2018-11-30 MED ORDER — PROPOFOL 10 MG/ML IV BOLUS
INTRAVENOUS | Status: DC | PRN
  Administered 2018-11-30: 40 mg via INTRAVENOUS

## 2018-11-30 MED ORDER — LEVOFLOXACIN IN D5W 750 MG/150ML IV SOLN
750.0000 mg | INTRAVENOUS | Status: AC
  Administered 2018-12-01: 750 mg via INTRAVENOUS
  Filled 2018-11-30: qty 150

## 2018-11-30 MED ORDER — CHLORHEXIDINE GLUCONATE CLOTH 2 % EX PADS
6.0000 | MEDICATED_PAD | Freq: Every day | CUTANEOUS | Status: DC
  Administered 2018-12-02 – 2018-12-11 (×9): 6 via TOPICAL

## 2018-11-30 MED ORDER — MORPHINE SULFATE (PF) 2 MG/ML IV SOLN
1.0000 mg | INTRAVENOUS | Status: DC | PRN
  Administered 2018-12-01 – 2018-12-06 (×10): 2 mg via INTRAVENOUS
  Filled 2018-11-30 (×11): qty 1

## 2018-11-30 MED ORDER — HEMOSTATIC AGENTS (NO CHARGE) OPTIME
TOPICAL | Status: DC | PRN
  Administered 2018-11-30 (×2): 1 via TOPICAL

## 2018-11-30 MED ORDER — NITROGLYCERIN IN D5W 200-5 MCG/ML-% IV SOLN
0.0000 ug/min | INTRAVENOUS | Status: DC
  Administered 2018-12-02: 25 ug/min via INTRAVENOUS
  Administered 2018-12-03: 30 ug/min via INTRAVENOUS
  Filled 2018-11-30: qty 250

## 2018-11-30 MED ORDER — MIDAZOLAM HCL (PF) 10 MG/2ML IJ SOLN
INTRAMUSCULAR | Status: AC
  Filled 2018-11-30: qty 2

## 2018-11-30 MED ORDER — VANCOMYCIN HCL 1000 MG IV SOLR
INTRAVENOUS | Status: AC
  Filled 2018-11-30: qty 1000

## 2018-11-30 MED ORDER — ASPIRIN 81 MG PO CHEW
324.0000 mg | CHEWABLE_TABLET | Freq: Every day | ORAL | Status: DC
  Administered 2018-12-01 – 2018-12-05 (×2): 324 mg
  Filled 2018-11-30 (×2): qty 4

## 2018-11-30 MED ORDER — ACETAMINOPHEN 160 MG/5ML PO SOLN
650.0000 mg | Freq: Once | ORAL | Status: AC
  Administered 2018-11-30: 650 mg

## 2018-11-30 MED ORDER — SODIUM CHLORIDE 0.45 % IV SOLN
INTRAVENOUS | Status: DC | PRN
  Administered 2018-11-30 – 2018-12-04 (×3): via INTRAVENOUS

## 2018-11-30 MED ORDER — ORAL CARE MOUTH RINSE
15.0000 mL | OROMUCOSAL | Status: DC
  Administered 2018-12-01 – 2018-12-02 (×14): 15 mL via OROMUCOSAL

## 2018-11-30 MED ORDER — MAGNESIUM SULFATE 4 GM/100ML IV SOLN
4.0000 g | Freq: Once | INTRAVENOUS | Status: AC
  Administered 2018-11-30: 4 g via INTRAVENOUS
  Filled 2018-11-30: qty 100

## 2018-11-30 MED ORDER — ALBUMIN HUMAN 5 % IV SOLN
INTRAVENOUS | Status: DC | PRN
  Administered 2018-11-30 (×3): via INTRAVENOUS

## 2018-11-30 MED ORDER — MILRINONE LACTATE IN DEXTROSE 20-5 MG/100ML-% IV SOLN
0.2500 ug/kg/min | INTRAVENOUS | Status: DC
  Administered 2018-12-01 – 2018-12-03 (×4): 0.25 ug/kg/min via INTRAVENOUS
  Filled 2018-11-30 (×4): qty 100

## 2018-11-30 MED ORDER — POTASSIUM CHLORIDE 10 MEQ/50ML IV SOLN: 10.0000 meq | INTRAVENOUS | Status: AC

## 2018-11-30 MED ORDER — VANCOMYCIN HCL 1000 MG IV SOLR
INTRAVENOUS | Status: DC | PRN
  Administered 2018-11-30: 1000 mL

## 2018-11-30 MED ORDER — VANCOMYCIN HCL IN DEXTROSE 1-5 GM/200ML-% IV SOLN
1000.0000 mg | Freq: Once | INTRAVENOUS | Status: AC
  Administered 2018-11-30: 20:00:00 1000 mg via INTRAVENOUS
  Filled 2018-11-30: qty 200

## 2018-11-30 MED ORDER — SODIUM CHLORIDE 0.9% IV SOLUTION
Freq: Once | INTRAVENOUS | Status: AC
  Administered 2018-11-30: 20:00:00 via INTRAVENOUS

## 2018-11-30 MED ORDER — AMIODARONE HCL IN DEXTROSE 360-4.14 MG/200ML-% IV SOLN
INTRAVENOUS | Status: DC | PRN
  Administered 2018-11-30: 60 mg/h via INTRAVENOUS

## 2018-11-30 MED ORDER — PROTAMINE SULFATE 10 MG/ML IV SOLN
INTRAVENOUS | Status: AC
  Filled 2018-11-30: qty 25

## 2018-11-30 MED ORDER — FENTANYL CITRATE (PF) 250 MCG/5ML IJ SOLN
INTRAMUSCULAR | Status: DC | PRN
  Administered 2018-11-30 (×3): 50 ug via INTRAVENOUS
  Administered 2018-11-30: 100 ug via INTRAVENOUS

## 2018-11-30 MED ORDER — SODIUM CHLORIDE 0.9 % IV SOLN
INTRAVENOUS | Status: DC
  Administered 2018-11-30: 10 mL via INTRAVENOUS
  Administered 2018-12-07: 05:00:00 via INTRAVENOUS

## 2018-11-30 MED ORDER — CHLORHEXIDINE GLUCONATE 0.12 % MT SOLN: 15.0000 mL | OROMUCOSAL | Status: AC

## 2018-11-30 MED ORDER — MIDAZOLAM HCL 5 MG/5ML IJ SOLN
INTRAMUSCULAR | Status: DC | PRN
  Administered 2018-11-30: 2 mg via INTRAVENOUS
  Administered 2018-11-30: 4 mg via INTRAVENOUS
  Administered 2018-11-30: 2 mg via INTRAVENOUS

## 2018-11-30 MED ORDER — HEPARIN SODIUM (PORCINE) 1000 UNIT/ML IJ SOLN
INTRAMUSCULAR | Status: AC
  Filled 2018-11-30: qty 1

## 2018-11-30 MED ORDER — NOREPINEPHRINE 4 MG/250ML-% IV SOLN
INTRAVENOUS | Status: AC
  Filled 2018-11-30: qty 250

## 2018-11-30 MED ORDER — SODIUM CHLORIDE 0.9% IV SOLUTION
Freq: Once | INTRAVENOUS | Status: AC
  Administered 2018-11-30: 16:00:00 via INTRAVENOUS

## 2018-11-30 MED ORDER — LACTATED RINGERS IV SOLN
INTRAVENOUS | Status: DC | PRN
  Administered 2018-11-30: 06:00:00 via INTRAVENOUS

## 2018-11-30 MED ORDER — SODIUM CHLORIDE 0.9% IV SOLUTION
Freq: Once | INTRAVENOUS | Status: AC
  Administered 2018-11-30: 10 mL via INTRAVENOUS

## 2018-11-30 MED ORDER — CALCIUM CHLORIDE 10 % IV SOLN
1.0000 g | Freq: Once | INTRAVENOUS | Status: AC
  Administered 2018-11-30: 1 g via INTRAVENOUS

## 2018-11-30 MED ORDER — ROCURONIUM BROMIDE 100 MG/10ML IV SOLN
INTRAVENOUS | Status: DC | PRN
  Administered 2018-11-30 (×2): 100 mg via INTRAVENOUS

## 2018-11-30 MED ORDER — ROCURONIUM BROMIDE 10 MG/ML (PF) SYRINGE
PREFILLED_SYRINGE | INTRAVENOUS | Status: DC | PRN
  Administered 2018-11-30: 100 mg via INTRAVENOUS
  Administered 2018-11-30 (×4): 50 mg via INTRAVENOUS
  Administered 2018-11-30: 40 mg via INTRAVENOUS
  Administered 2018-11-30: 50 mg via INTRAVENOUS

## 2018-11-30 MED ORDER — METOPROLOL TARTRATE 5 MG/5ML IV SOLN: 2.5000 mg | INTRAVENOUS | Status: DC | PRN

## 2018-11-30 MED ORDER — PANTOPRAZOLE SODIUM 40 MG PO TBEC
40.0000 mg | DELAYED_RELEASE_TABLET | Freq: Every day | ORAL | Status: DC
  Administered 2018-12-02 – 2018-12-11 (×9): 40 mg via ORAL
  Filled 2018-11-30 (×9): qty 1

## 2018-11-30 MED ORDER — HEMOSTATIC AGENTS (NO CHARGE) OPTIME
TOPICAL | Status: DC | PRN
  Administered 2018-11-30 (×3): 1 via TOPICAL

## 2018-11-30 MED ORDER — METOPROLOL TARTRATE 25 MG/10 ML ORAL SUSPENSION: 12.5000 mg | Freq: Two times a day (BID) | ORAL | Status: DC

## 2018-11-30 MED ORDER — MIDAZOLAM HCL 2 MG/2ML IJ SOLN
2.0000 mg | INTRAMUSCULAR | Status: DC | PRN
  Administered 2018-12-01: 2 mg via INTRAVENOUS
  Filled 2018-11-30: qty 2

## 2018-11-30 MED ORDER — SODIUM CHLORIDE 0.9 % IV SOLN
INTRAVENOUS | Status: DC | PRN
  Administered 2018-11-30: 14:00:00 via INTRAVENOUS

## 2018-11-30 MED ORDER — BISACODYL 5 MG PO TBEC
10.0000 mg | DELAYED_RELEASE_TABLET | Freq: Every day | ORAL | Status: DC
  Administered 2018-12-01 – 2018-12-10 (×6): 10 mg via ORAL
  Filled 2018-11-30 (×7): qty 2

## 2018-11-30 MED ORDER — SODIUM CHLORIDE 0.9% FLUSH
3.0000 mL | Freq: Two times a day (BID) | INTRAVENOUS | Status: DC
  Administered 2018-12-01 – 2018-12-11 (×17): 3 mL via INTRAVENOUS

## 2018-11-30 MED ORDER — ASPIRIN EC 325 MG PO TBEC
325.0000 mg | DELAYED_RELEASE_TABLET | Freq: Every day | ORAL | Status: DC
  Administered 2018-12-02 – 2018-12-11 (×9): 325 mg via ORAL
  Filled 2018-11-30 (×9): qty 1

## 2018-11-30 MED ORDER — 0.9 % SODIUM CHLORIDE (POUR BTL) OPTIME
TOPICAL | Status: DC | PRN
  Administered 2018-11-30: 2000 mL

## 2018-11-30 MED ORDER — DEXMEDETOMIDINE HCL IN NACL 400 MCG/100ML IV SOLN
0.0000 ug/kg/h | INTRAVENOUS | Status: DC
  Administered 2018-11-30: 0.7 ug/kg/h via INTRAVENOUS
  Administered 2018-12-01: 0.3 ug/kg/h via INTRAVENOUS
  Administered 2018-12-01 – 2018-12-02 (×3): 0.7 ug/kg/h via INTRAVENOUS
  Administered 2018-12-04: 0.2 ug/kg/h via INTRAVENOUS
  Administered 2018-12-04: 0.4 ug/kg/h via INTRAVENOUS
  Filled 2018-11-30 (×6): qty 100

## 2018-11-30 MED ORDER — SODIUM CHLORIDE (PF) 0.9 % IJ SOLN
OROMUCOSAL | Status: DC | PRN
  Administered 2018-11-30 (×2): 4 mL via TOPICAL

## 2018-11-30 MED FILL — Indocyanine Green For IV Soln 25 MG: INTRAVENOUS | Qty: 25 | Status: AC

## 2018-11-30 SURGICAL SUPPLY — 127 items
ADAPTER CARDIO PERF ANTE/RETRO (ADAPTER) ×3 IMPLANT
APPLICATOR TIP COSEAL (VASCULAR PRODUCTS) ×2 IMPLANT
BAG DECANTER FOR FLEXI CONT (MISCELLANEOUS) ×3 IMPLANT
BASKET HEART (ORDER IN 25'S) (MISCELLANEOUS) ×1
BASKET HEART (ORDER IN 25S) (MISCELLANEOUS) ×2 IMPLANT
BLADE CLIPPER SURG (BLADE) ×3 IMPLANT
BLADE STERNUM SYSTEM 6 (BLADE) ×3 IMPLANT
BLADE SURG 15 STRL LF DISP TIS (BLADE) IMPLANT
BLADE SURG 15 STRL SS (BLADE) ×1
BNDG ELASTIC 4X5.8 VLCR STR LF (GAUZE/BANDAGES/DRESSINGS) ×3 IMPLANT
BNDG ELASTIC 6X5.8 VLCR STR LF (GAUZE/BANDAGES/DRESSINGS) ×3 IMPLANT
BNDG GAUZE ELAST 4 BULKY (GAUZE/BANDAGES/DRESSINGS) ×3 IMPLANT
CANISTER SUCT 3000ML PPV (MISCELLANEOUS) ×3 IMPLANT
CANN PRFSN 3/8XCNCT ST RT ANG (MISCELLANEOUS) ×2
CANN PRFSN 3/8XRT ANG TPR 14 (MISCELLANEOUS) ×2
CANNULA NON VENT 20FR 12 (CANNULA) ×1 IMPLANT
CANNULA PRFSN 3/8XCNCT RT ANG (MISCELLANEOUS) IMPLANT
CANNULA PRFSN 3/8XRT ANG TPR14 (MISCELLANEOUS) IMPLANT
CANNULA VEN MTL TIP RT (MISCELLANEOUS) ×2
CATH CPB KIT HENDRICKSON (MISCELLANEOUS) ×3 IMPLANT
CATH DIAG EXPO 6F AL1 (CATHETERS) ×3 IMPLANT
CATH INFINITI 6F MPB2 (CATHETERS) ×3 IMPLANT
CATH ROBINSON RED A/P 18FR (CATHETERS) ×7 IMPLANT
CLIP RETRACTION 3.0MM CORONARY (MISCELLANEOUS) ×3 IMPLANT
CONN 1/2X1/2X1/2  BEN (MISCELLANEOUS) ×1
CONN 1/2X1/2X1/2 BEN (MISCELLANEOUS) IMPLANT
CONN 3/8X1/2 ST GISH (MISCELLANEOUS) ×2 IMPLANT
CONN ST 1/4X3/8  BEN (MISCELLANEOUS) ×1
CONN ST 1/4X3/8 BEN (MISCELLANEOUS) IMPLANT
CONT SPEC 4OZ CLIKSEAL STRL BL (MISCELLANEOUS) ×1 IMPLANT
COVER WAND RF STERILE (DRAPES) ×2 IMPLANT
DERMABOND ADVANCED (GAUZE/BANDAGES/DRESSINGS) ×1
DERMABOND ADVANCED .7 DNX12 (GAUZE/BANDAGES/DRESSINGS) ×2 IMPLANT
DEVICE SUT CK QUICK LOAD MINI (Prosthesis & Implant Heart) ×1 IMPLANT
DRAIN CHANNEL 28F RND 3/8 FF (WOUND CARE) ×10 IMPLANT
DRAPE C-ARM 42X72 X-RAY (DRAPES) ×4 IMPLANT
DRAPE CARDIOVASCULAR INCISE (DRAPES) ×1
DRAPE CV SPLIT W-CLR ANES SCRN (DRAPES) ×3 IMPLANT
DRAPE INCISE IOBAN 66X45 STRL (DRAPES) ×1 IMPLANT
DRAPE PERI GROIN 82X75IN TIB (DRAPES) ×3 IMPLANT
DRAPE SLUSH/WARMER DISC (DRAPES) ×3 IMPLANT
DRAPE SRG 135X102X78XABS (DRAPES) ×2 IMPLANT
DRSG AQUACEL AG ADV 3.5X14 (GAUZE/BANDAGES/DRESSINGS) ×3 IMPLANT
ELECT BLADE 6.5 EXT (BLADE) ×1 IMPLANT
ELECT CAUTERY BLADE 6.4 (BLADE) ×3 IMPLANT
ELECT REM PT RETURN 9FT ADLT (ELECTROSURGICAL) ×6
ELECTRODE REM PT RTRN 9FT ADLT (ELECTROSURGICAL) ×4 IMPLANT
FELT TEFLON 1X6 (MISCELLANEOUS) ×6 IMPLANT
GAUZE SPONGE 4X4 12PLY STRL (GAUZE/BANDAGES/DRESSINGS) ×6 IMPLANT
GAUZE SPONGE 4X4 12PLY STRL LF (GAUZE/BANDAGES/DRESSINGS) ×2 IMPLANT
GLOVE BIO SURGEON STRL SZ 6.5 (GLOVE) ×3 IMPLANT
GLOVE BIO SURGEON STRL SZ7 (GLOVE) ×1 IMPLANT
GLOVE BIOGEL PI IND STRL 7.5 (GLOVE) IMPLANT
GLOVE BIOGEL PI INDICATOR 7.5 (GLOVE) ×2
GLOVE NEODERM STRL 7.5 LF PF (GLOVE) ×6 IMPLANT
GLOVE SURG NEODERM 7.5  LF PF (GLOVE) ×6
GLOVE SURG SS PI 7.0 STRL IVOR (GLOVE) ×5 IMPLANT
GOWN STRL REUS W/ TWL LRG LVL3 (GOWN DISPOSABLE) ×8 IMPLANT
GOWN STRL REUS W/TWL LRG LVL3 (GOWN DISPOSABLE) ×8
GRAFT HEMASHIELD 10MM (Graft) ×1 IMPLANT
GRAFT VASC STRG 30X10STRL (Graft) IMPLANT
HEMOSTAT POWDER SURGIFOAM 1G (HEMOSTASIS) ×9 IMPLANT
HEMOSTAT SURGICEL 2X14 (HEMOSTASIS) ×3 IMPLANT
INSERT FOGARTY SM (MISCELLANEOUS) ×5 IMPLANT
INSERT FOGARTY XLG (MISCELLANEOUS) ×1 IMPLANT
KIT BASIN OR (CUSTOM PROCEDURE TRAY) ×3 IMPLANT
KIT SUCTION CATH 14FR (SUCTIONS) ×3 IMPLANT
KIT SUT CK MINI COMBO 4X17 (Prosthesis & Implant Heart) ×1 IMPLANT
KIT TURNOVER KIT B (KITS) ×3 IMPLANT
KIT VASOVIEW HEMOPRO 2 VH 4000 (KITS) ×3 IMPLANT
LEAD PACING MYOCARDI (MISCELLANEOUS) ×3 IMPLANT
LINE VENT (MISCELLANEOUS) ×1 IMPLANT
LOOP VESSEL MINI RED (MISCELLANEOUS) ×3 IMPLANT
MARKER GRAFT CORONARY BYPASS (MISCELLANEOUS) ×9 IMPLANT
NDL BLUNT 18X1 FOR OR ONLY (NEEDLE) IMPLANT
NEEDLE BLUNT 18X1 FOR OR ONLY (NEEDLE) ×3 IMPLANT
NS IRRIG 1000ML POUR BTL (IV SOLUTION) ×16 IMPLANT
PACK CHEST (CUSTOM PROCEDURE TRAY) ×3 IMPLANT
PACK E OPEN HEART (SUTURE) ×3 IMPLANT
PACK OPEN HEART (CUSTOM PROCEDURE TRAY) ×3 IMPLANT
PACK SPY-PHI (KITS) ×1 IMPLANT
PAD ARMBOARD 7.5X6 YLW CONV (MISCELLANEOUS) ×6 IMPLANT
PAD ELECT DEFIB RADIOL ZOLL (MISCELLANEOUS) ×3 IMPLANT
PENCIL BUTTON HOLSTER BLD 10FT (ELECTRODE) ×3 IMPLANT
POSITIONER HEAD DONUT 9IN (MISCELLANEOUS) ×3 IMPLANT
PUMP SET IMPELLA LD (CATHETERS) ×1 IMPLANT
PUNCH AORTIC ROTATE  4.5MM 8IN (MISCELLANEOUS) ×1 IMPLANT
SEALANT SURG COSEAL 8ML (VASCULAR PRODUCTS) ×4 IMPLANT
SET CARDIOPLEGIA MPS 5001102 (MISCELLANEOUS) ×1 IMPLANT
SPONGE LAP 18X18 X RAY DECT (DISPOSABLE) ×1 IMPLANT
STAPLER VISISTAT 35W (STAPLE) IMPLANT
SUT BONE WAX W31G (SUTURE) ×3 IMPLANT
SUT ETHIBOND 2 0 SH (SUTURE) ×1 IMPLANT
SUT ETHIBOND X763 2 0 SH 1 (SUTURE) ×3 IMPLANT
SUT ETHILON 3 0 PS 1 (SUTURE) ×6 IMPLANT
SUT MNCRL AB 3-0 PS2 18 (SUTURE) ×6 IMPLANT
SUT PDS AB 1 CTX 36 (SUTURE) ×6 IMPLANT
SUT PROLENE 3 0 SH DA (SUTURE) ×5 IMPLANT
SUT PROLENE 4 0 SH DA (SUTURE) ×2 IMPLANT
SUT PROLENE 5 0 C 1 36 (SUTURE) IMPLANT
SUT PROLENE 6 0 C 1 30 (SUTURE) ×11 IMPLANT
SUT PROLENE 7 0 BV 1 (SUTURE) ×1 IMPLANT
SUT PROLENE 8 0 BV175 6 (SUTURE) IMPLANT
SUT PROLENE BLUE 7 0 (SUTURE) ×3 IMPLANT
SUT SILK  1 MH (SUTURE) ×6
SUT SILK 1 MH (SUTURE) ×8 IMPLANT
SUT SILK 1 TIES 10X30 (SUTURE) ×3 IMPLANT
SUT SILK 2 0 SH CR/8 (SUTURE) ×1 IMPLANT
SUT SILK 3 0 SH CR/8 (SUTURE) IMPLANT
SUT STEEL 6MS V (SUTURE) ×3 IMPLANT
SUT STEEL SZ 6 DBL 3X14 BALL (SUTURE) ×3 IMPLANT
SUT VIC AB 2-0 CT1 27 (SUTURE) ×1
SUT VIC AB 2-0 CT1 TAPERPNT 27 (SUTURE) IMPLANT
SUT VIC AB 2-0 CTX 27 (SUTURE) IMPLANT
SUT VIC AB 3-0 X1 27 (SUTURE) IMPLANT
SYR 10ML LL (SYRINGE) ×2 IMPLANT
SYSTEM SAHARA CHEST DRAIN ATS (WOUND CARE) ×4 IMPLANT
TAPE CLOTH SOFT 2X10 (GAUZE/BANDAGES/DRESSINGS) ×1 IMPLANT
TAPE CLOTH SURG 4X10 WHT LF (GAUZE/BANDAGES/DRESSINGS) ×1 IMPLANT
TOWEL GREEN STERILE (TOWEL DISPOSABLE) ×3 IMPLANT
TOWEL GREEN STERILE FF (TOWEL DISPOSABLE) ×3 IMPLANT
TRAY FOLEY SLVR 16FR TEMP STAT (SET/KITS/TRAYS/PACK) ×3 IMPLANT
TUBING LAP HI FLOW INSUFFLATIO (TUBING) ×3 IMPLANT
UNDERPAD 30X30 (UNDERPADS AND DIAPERS) ×3 IMPLANT
VALVE MAGNA MITRAL 27MM (Prosthesis & Implant Heart) ×1 IMPLANT
WATER STERILE IRR 1000ML POUR (IV SOLUTION) ×6 IMPLANT
WATER STERILE IRR 1000ML UROMA (IV SOLUTION) IMPLANT

## 2018-11-30 SURGICAL SUPPLY — 59 items
BAG DECANTER FOR FLEXI CONT (MISCELLANEOUS) ×2 IMPLANT
CABLE PACING FASLOC BIEGE (MISCELLANEOUS) ×2 IMPLANT
CANISTER SUCT 3000ML PPV (MISCELLANEOUS) ×2 IMPLANT
CLIP VESOCCLUDE MED LG 24/CT (CLIP) ×2 IMPLANT
CLIP VESOCCLUDE SM WIDE 24/CT (CLIP) ×2 IMPLANT
DRAPE CARDIOVASCULAR INCISE (DRAPES) ×1
DRAPE SLUSH/WARMER DISC (DRAPES) ×2 IMPLANT
DRAPE SRG 135X102X78XABS (DRAPES) ×1 IMPLANT
DRSG AQUACEL AG ADV 3.5X14 (GAUZE/BANDAGES/DRESSINGS) ×2 IMPLANT
ELECT REM PT RETURN 9FT ADLT (ELECTROSURGICAL) ×4
ELECTRODE REM PT RTRN 9FT ADLT (ELECTROSURGICAL) ×2 IMPLANT
FELT TEFLON 1X6 (MISCELLANEOUS) ×2 IMPLANT
GAUZE SPONGE 4X4 12PLY STRL LF (GAUZE/BANDAGES/DRESSINGS) ×2 IMPLANT
GLOVE NEODERM STRL 7.5 LF PF (GLOVE) ×2 IMPLANT
GLOVE SURG NEODERM 7.5  LF PF (GLOVE) ×2
GOWN STRL REUS W/ TWL LRG LVL3 (GOWN DISPOSABLE) ×4 IMPLANT
GOWN STRL REUS W/TWL LRG LVL3 (GOWN DISPOSABLE) ×4
HEMOSTAT POWDER SURGIFOAM 1G (HEMOSTASIS) ×4 IMPLANT
IV NS 1000ML (IV SOLUTION) ×1
IV NS 1000ML BAXH (IV SOLUTION) ×1 IMPLANT
KIT BASIN OR (CUSTOM PROCEDURE TRAY) ×2 IMPLANT
KIT SUCTION CATH 14FR (SUCTIONS) ×2 IMPLANT
KIT TURNOVER KIT B (KITS) ×2 IMPLANT
LEAD PACING MYOCARDI (MISCELLANEOUS) ×2 IMPLANT
MARKER GRAFT CORONARY BYPASS (MISCELLANEOUS) ×6 IMPLANT
NEEDLE 18GX1X1/2 (RX/OR ONLY) (NEEDLE) ×2 IMPLANT
NS IRRIG 1000ML POUR BTL (IV SOLUTION) ×4 IMPLANT
PACK SPY-PHI (KITS) IMPLANT
PAD ELECT DEFIB RADIOL ZOLL (MISCELLANEOUS) ×2 IMPLANT
POSITIONER HEAD DONUT 9IN (MISCELLANEOUS) ×2 IMPLANT
SEALANT PATCH FIBRIN 2X4IN (MISCELLANEOUS) ×2 IMPLANT
SEALANT SURG COSEAL 4ML (VASCULAR PRODUCTS) ×2 IMPLANT
STAPLER VISISTAT 35W (STAPLE) ×4 IMPLANT
SUT BONE WAX W31G (SUTURE) ×2 IMPLANT
SUT ETHIBOND X763 2 0 SH 1 (SUTURE) ×2 IMPLANT
SUT MNCRL AB 3-0 PS2 18 (SUTURE) ×4 IMPLANT
SUT PDS AB 1 CTX 36 (SUTURE) ×4 IMPLANT
SUT PROLENE 4 0 RB 1 (SUTURE) ×3
SUT PROLENE 4 0 SH DA (SUTURE) ×2 IMPLANT
SUT PROLENE 4-0 RB1 .5 CRCL 36 (SUTURE) ×3 IMPLANT
SUT PROLENE 5 0 C 1 36 (SUTURE) ×2 IMPLANT
SUT PROLENE 6 0 C 1 30 (SUTURE) ×4 IMPLANT
SUT PROLENE 7 0 BV 1 (SUTURE) ×2 IMPLANT
SUT PROLENE 8 0 BV175 6 (SUTURE) IMPLANT
SUT SILK 2 0 SH CR/8 (SUTURE) IMPLANT
SUT SILK 2 0SH CR/8 30 (SUTURE) ×2 IMPLANT
SUT SILK 3 0 SH CR/8 (SUTURE) IMPLANT
SUT STEEL SZ 6 DBL 3X14 BALL (SUTURE) ×2 IMPLANT
SUT VIC AB 2-0 CTX 27 (SUTURE) IMPLANT
SUT VIC AB 2-0 CTX 36 (SUTURE) ×4 IMPLANT
SUT VIC AB 3-0 X1 27 (SUTURE) IMPLANT
SYR 10ML LL (SYRINGE) ×4 IMPLANT
TAPE CLOTH SOFT 2X10 (GAUZE/BANDAGES/DRESSINGS) ×2 IMPLANT
TAPE CLOTH SURG 4X10 WHT LF (GAUZE/BANDAGES/DRESSINGS) ×2 IMPLANT
TOWEL GREEN STERILE FF (TOWEL DISPOSABLE) ×2 IMPLANT
TUBE CONNECTING 20X1/4 (TUBING) ×2 IMPLANT
UNDERPAD 30X30 (UNDERPADS AND DIAPERS) ×2 IMPLANT
WATER STERILE IRR 1000ML POUR (IV SOLUTION) ×4 IMPLANT
WATER STERILE IRR 1000ML UROMA (IV SOLUTION) IMPLANT

## 2018-11-30 NOTE — Anesthesia Preprocedure Evaluation (Signed)
Anesthesia Evaluation  Preop documentation limited or incomplete due to emergent nature of procedure.  Airway Mallampati: Intubated       Dental   Pulmonary Current Smoker,    breath sounds clear to auscultation       Cardiovascular  Rhythm:Regular Rate:Normal  S/p MVR and CABG with low EF and post-op bleeding   Neuro/Psych    GI/Hepatic   Endo/Other    Renal/GU      Musculoskeletal   Abdominal   Peds  Hematology   Anesthesia Other Findings   Reproductive/Obstetrics                             Anesthesia Physical Anesthesia Plan  ASA: V and emergent  Anesthesia Plan: General   Post-op Pain Management:    Induction: Inhalational  PONV Risk Score and Plan: Treatment may vary due to age or medical condition  Airway Management Planned: Oral ETT  Additional Equipment: Arterial line, CVP, PA Cath and TEE  Intra-op Plan:   Post-operative Plan: Post-operative intubation/ventilation  Informed Consent: I have reviewed the patients History and Physical, chart, labs and discussed the procedure including the risks, benefits and alternatives for the proposed anesthesia with the patient or authorized representative who has indicated his/her understanding and acceptance.       Plan Discussed with: CRNA and Surgeon  Anesthesia Plan Comments:         Anesthesia Quick Evaluation

## 2018-11-30 NOTE — Anesthesia Postprocedure Evaluation (Signed)
Anesthesia Post Note  Patient: Curtis [name]  Procedure(s) Performed: CORONARY ARTERY BYPASS GRAFTING (CABG) x Three , using left internal mammary artery and left leg greater saphenous vein harvested endoscopically (N/A Chest) MITRAL VALVE REPLACEMENT (MVR) USING MAGNA EASE SIZE 55mm (N/A Chest) TRANSESOPHAGEAL ECHOCARDIOGRAM (TEE) (N/A ) INDOCYANINE GREEN FLUORESCENCE IMAGING (ICG) (N/A ) PLACEMENT OF IMPELLA LEFT VENTRICULAR ASSIST DEVICE (N/A )     Patient location during evaluation: ICU Anesthesia Type: General Level of consciousness: sedated and patient remains intubated per anesthesia plan Pain management: pain level controlled Vital Signs Assessment: post-procedure vital signs reviewed and stable Respiratory status: patient remains intubated per anesthesia plan Cardiovascular status: stable Postop Assessment: no apparent nausea or vomiting Anesthetic complications: no    Last Vitals:  Vitals:   11/30/18 2019 11/30/18 2034  BP:    Pulse:  92  Resp:  12  Temp:  (!) 35.7 C  SpO2: 98%     Last Pain:  Vitals:   11/30/18 1831  TempSrc: Core  PainSc:                  Audry Pili

## 2018-11-30 NOTE — Anesthesia Procedure Notes (Signed)
Central Venous Catheter Insertion Performed by: Albertha Ghee, MD, anesthesiologist Start/End9/28/2020 6:50 AM, 11/30/2018 7:01 AM Patient location: Pre-op. Preanesthetic checklist: patient identified, IV checked, site marked, risks and benefits discussed, surgical consent, monitors and equipment checked, pre-op evaluation, timeout performed and anesthesia consent Position: Trendelenburg Lidocaine 1% used for infiltration and patient sedated Hand hygiene performed , maximum sterile barriers used  and Seldinger technique used Catheter size: 9 Fr Central line and PA cath was placed.MAC introducer Swan type:thermodilation and thermodilution Procedure performed using ultrasound guided technique. Ultrasound Notes:anatomy identified, needle tip was noted to be adjacent to the nerve/plexus identified, no ultrasound evidence of intravascular and/or intraneural injection and image(s) printed for medical record Attempts: 1 Following insertion, line sutured, dressing applied and Biopatch. Post procedure assessment: blood return through all ports, free fluid flow and no air  Patient tolerated the procedure well with no immediate complications.

## 2018-11-30 NOTE — Anesthesia Procedure Notes (Signed)
Procedure Name: Intubation Date/Time: 11/30/2018 7:51 AM Performed by: Colin Benton, CRNA Pre-anesthesia Checklist: Patient identified, Emergency Drugs available, Suction available and Patient being monitored Patient Re-evaluated:Patient Re-evaluated prior to induction Oxygen Delivery Method: Circle system utilized Preoxygenation: Pre-oxygenation with 100% oxygen Induction Type: IV induction Ventilation: Mask ventilation without difficulty Laryngoscope Size: Miller and 2 Grade View: Grade II Tube type: Oral Tube size: 8.0 mm Number of attempts: 1 Airway Equipment and Method: Stylet Placement Confirmation: ETT inserted through vocal cords under direct vision,  positive ETCO2 and breath sounds checked- equal and bilateral Secured at: 23 cm Tube secured with: Tape Dental Injury: Teeth and Oropharynx as per pre-operative assessment

## 2018-11-30 NOTE — Brief Op Note (Signed)
11/30/2018  2:56 PM  PATIENT:  Curtis [name]  68 y.o. male  PRE-OPERATIVE DIAGNOSIS:  Coronary Artery Disease Mitral Regurgitation Mild aortic insufficiency  POST-OPERATIVE DIAGNOSIS:  Coronary Artery Disease Mitral Regurgitation Mild aortic insufficiency  PROCEDURE:  Procedure(s): CORONARY ARTERY BYPASS GRAFTING (CABG) x Three , using left internal mammary artery and left leg greater saphenous vein harvested endoscopically (N/A) MITRAL VALVE REPLACEMENT (MVR) USING MAGNA EASE SIZE 43mm (N/A) TRANSESOPHAGEAL ECHOCARDIOGRAM (TEE) (N/A) INDOCYANINE GREEN FLUORESCENCE IMAGING (ICG) (N/A) PLACEMENT OF IMPELLA LEFT VENTRICULAR ASSIST DEVICE (N/A) (direct aortic)  SURGEON:  Surgeon(s) and Role:    Wonda Olds, MD - Primary  PHYSICIAN ASSISTANT: Jadene Pierini PA-C  ASSISTANTS: staff   ANESTHESIA:   general  EBL:  900 mL   BLOOD ADMINISTERED:250 FFP  DRAINS: 4 Chest Tube(s) in the mediastinum and bilateral pleural spaces   LOCAL MEDICATIONS USED:  NONE  SPECIMEN:  Source of Specimen:  anterior leaflet mitral valve representing the central portion of the leaflet only; the remainder of the leaflet was spared  DISPOSITION OF SPECIMEN:  PATHOLOGY  COUNTS:  YES  TOURNIQUET:  * No tourniquets in log *  DICTATION: .Note written in EPIC  PLAN OF CARE: Admit to inpatient   PATIENT DISPOSITION:  ICU - intubated and critically ill.   Delay start of Pharmacological VTE agent (>24hrs) due to surgical blood loss or risk of bleeding: yes

## 2018-11-30 NOTE — Op Note (Signed)
Procedure(s): MEDIASTINAL POST-OP BLEED Procedure Note  Curtis [name] male 68 y.o. 11/30/2018  Procedure(s) and Anesthesia Type:    * MEDIASTINAL POST-OP BLEED - General  Surgeon(s) and Role:    * Wonda Olds, MD - Primary   Indications: The patient underwent CABG x 3 and MVR earlier today complicated by persistent coagulopathy and chest tube output. Taken back to OR urgently.       Surgeon: Wonda Olds   Assistants: Macarthur Critchley PA-C  Anesthesia: General endotracheal anesthesia  ASA Class: 5    Procedure Detail  The patient was taken to the operating room urgently for mediastinal bleeding.  He was placed on the supine position on the operating table.  Anesthesia was confirmed by the general endotracheal technique.  The anterior chest and abdomen was prepped and draped sterile field using Betadine paint and soap.  A preop surgical pause was performed.  The prior sternal incision was reopened and hardware removed.  There is a moderate anterior mediastinal hematoma evacuated.  Careful inspection was made throughout the mediastinum and bilateral pleural spaces for discrete bleeding but in general there was more characteristic of diffuse bleeding.  Multiple rounds of blood products were required for coagulopathy. Once happy with the degree of hemostasis chest tubes restored to the mediastinum and bilateral pleural spaces.  The sternum was reapproximated stainless wires in standard fashion. The tissue overlying the sternum was copiously irrigated and repaired in layers.  Staples reapproximated the skin all sponge instruments and needle counts were correct.  I was present and participated in all aspects procedure.  Findings: Diffuse coagulopathy  Estimated Blood Loss:  100 mL         Drains: 4 chest tubes         Total IV Fluids:  500 mL cell saver  Blood Given: 500 CC CELLSAVER          Specimens: none         Implants: none        Complications:  * No complications  entered in OR log *         Disposition: ICU - intubated and critically ill.         Condition: stable  Kermitt Harjo Z. Orvan Seen, [place]

## 2018-11-30 NOTE — Anesthesia Procedure Notes (Signed)
Arterial Line Insertion Start/End9/28/2020 6:45 AM, 11/30/2018 6:48 AM Performed by: CRNA  Patient location: Pre-op. Preanesthetic checklist: patient identified, IV checked, site marked, risks and benefits discussed, surgical consent, monitors and equipment checked, pre-op evaluation, timeout performed and anesthesia consent Lidocaine 1% used for infiltration and patient sedated Left, radial was placed Catheter size: 20 G Hand hygiene performed , maximum sterile barriers used  and Seldinger technique used Allen's test indicative of satisfactory collateral circulation Attempts: 1 Procedure performed without using ultrasound guided technique. Following insertion, dressing applied and Biopatch. Post procedure assessment: normal and unchanged  Patient tolerated the procedure well with no immediate complications.

## 2018-11-30 NOTE — Progress Notes (Signed)
S/p cabg with CT surgery. Will sign off. Please let hospitalist know if we can be of assistance going forward.

## 2018-11-30 NOTE — Op Note (Signed)
CARDIOTHORACIC SURGERY OPERATIVE NOTE  Date of Procedure: 11/30/2018  Preoperative Diagnosis: Severe 3-vessel Coronary Artery Disease with reduced LV function; moderate to severe Mitral regurgitation Postoperative Diagnosis: Same  Procedure:    Coronary Artery Bypass Grafting x 3   Left Internal Mammary Artery to Distal Left Anterior Descending Coronary Artery; Saphenous Vein Graft to  Obtuse Marginal Branch of Left Circumflex Coronary Artery; Sapheonous Vein Graft to Diagonal Branch Coronary Artery, Endoscopic Vein Harvest from left Thigh and Lower Leg Mitral valve replacement (27 mm CE Magna Ease bovine bioprosthetic); insertion of Impella LVAD; completion graft surveillance with indocyanine green fluorescence imaging (SPY)  Surgeon: B. Lorayne Marek, MD  Assistant: Gershon Crane PA-C  Anesthesia: get  Operative Findings:  reduced left ventricular systolic function  good quality left internal mammary artery conduit  good quality saphenous vein conduit  Good quality target vessels for grafting  Severe mitral regurgitation by TEE     BRIEF CLINICAL NOTE AND INDICATIONS FOR SURGERY  68 yo man admitted to hospital with flash pulmonary edema.  He ruled in for an NSTEMI.  Work-up demonstrated severe multivessel coronary artery disease, reduced left ventricular ejection fraction, moderate to severe mitral regurgitation, and mild to moderate aortic valve insufficiency.  He is taken to the operating room after medical optimization.  DETAILS OF THE OPERATIVE PROCEDURE  Preparation:  The patient is brought to the operating room on the above mentioned date and central monitoring was established by the anesthesia team including placement of Swan-Ganz catheter and radial arterial line. The patient is placed in the supine position on the operating table.  Intravenous antibiotics are administered. General endotracheal anesthesia is induced uneventfully. A Foley catheter is placed.  Baseline  transesophageal echocardiogram was performed.  Findings were notable for severe mitral regurgitation despite medical optimization and under anesthesia.  LV ejection fraction was reduced at 30 to 35%.  Right ventricular ejection fraction is normal.  Based on these findings the decision was made to perform mitral valve replacement.  The patient's chest, abdomen, both groins, and both lower extremities are prepared and draped in a sterile manner. A time out procedure is performed.   Surgical Approach and Conduit Harvest:  A median sternotomy incision was performed and the left internal mammary artery is dissected from the chest wall and prepared for bypass grafting. The left internal mammary artery is notably good quality conduit. Simultaneously, the greater saphenous vein is obtained from the patient's left thigh using endoscopic vein harvest technique. The saphenous vein is notably good quality conduit. After removal of the saphenous vein, the small surgical incisions in the lower extremity are closed with absorbable suture. Following systemic heparinization, the left internal mammary artery was transected distally noted to have excellent flow.   Extracorporeal Cardiopulmonary Bypass and Myocardial Protection:  The pericardium is opened. The ascending aorta is nondiseased in appearance. The ascending aorta and both cava are cannulated for cardiopulmonary bypass.  Adequate heparinization is verified.  A retrograde cardioplegia cannula is placed through the right atrium into the coronary sinus.  The entire pre-bypass portion of the operation was notable for stable hemodynamics.  Cardiopulmonary bypass was begun and the surface of the heart is inspected. Distal target vessels are selected for coronary artery bypass grafting. A cardioplegia cannula is placed in the ascending aorta. A left ventricular vent is placed through the right superior pulmonary vein.  The patient is allowed to cool passively to  [name] systemic temperature.  The aortic cross clamp is applied and cold blood cardioplegia is  delivered initially in an antegrade fashion through the aortic root.  Supplemental cardioplegia is given retrograde through the coronary sinus catheter.  Iced saline slush is applied for topical hypothermia.  The initial cardioplegic arrest is rapid with early diastolic arrest.  Repeat doses of cardioplegia are administered intermittently throughout the entire cross clamp portion of the operation through the aortic root, through the coronary sinus catheter, and through subsequently placed vein grafts in order to maintain completely flat electrocardiogram. Myocardial protection was felt to be excellent.   Coronary Artery Bypass Grafting:   The obtuse marginal branch of the left circumflex coronary artery was grafted using a reversed saphenous vein graft in an end-to-side fashion.  At the site of distal anastomosis the target vessel was good quality and measured approximately 2 mm in diameter. Anastomotic patency and runoff was confirmed with indocyanine green fluorescence imaging (SPY).  The  diagonal branch of the left anterior descending coronary artery was grafted using a reversed saphenous vein graft in an end-to-side fashion.  At the site of distal anastomosis the target vessel was good quality and measured approximately 2 mm in diameter. Anastomotic patency and runoff was confirmed with indocyanine green fluorescence imaging (SPY).  The distal left anterior coronary artery was grafted with the left internal mammary artery in an end-to-side fashion.  At the site of distal anastomosis the target vessel was good quality and measured approximately 2 mm in diameter. Anastomotic patency and runoff was confirmed with indocyanine green fluorescence imaging (SPY).  All proximal vein graft anastomoses were placed directly to the ascending aorta prior to removal of the aortic cross clamp.     Next attention was  turned to mitral valve surgery.  The interatrial groove was dissected.  The left atrium was entered safely.  Self-retaining retractors were placed.  The mitral annulus was dilated.  Circumferential sutures of 2-0 Ethibond were placed with the pledgets on the atrial side.  These were then brought through the sewing cuff of a 27 mm magna ease bovine bioprosthetic valve.  Should be noted that the anterior leaflet of the mitral valve was split in the middle and the valve sutures were brought through the remnants of the anterior leaflet at either commissure to preserve the anterior leaflet integrity.  The valve was then seated position and the knots were tied with a core knot crimping device.  The valve seated well.  The left atrium was then closed in layers.  A reanimation dose of warm cardioplegia was given.  De-airing procedures were performed and the aortic cross-clamp was removed.  The aortic cross clamp was removed after a total cross clamp time of 139 minutes.   Procedure Completion:  All proximal and distal coronary anastomoses were inspected for hemostasis and appropriate graft orientation. Epicardial pacing wires are fixed to the right ventricular outflow tract and to the right atrial appendage. The patient is rewarmed to 37C temperature.  Attempts are made to wean the patient from bypass but the left ventricle was quite sluggish as expected.  Therefore the plan was made to insert a Impella assist device.  This was done by anastomosing a 10 mm dacryon graft to the distal aspect of the ascending aorta using a partial-occlusion clamp.  The Impella was then tunneled into the chest from the right neck.  Using TEE guidance the LD catheter was inserted across the aortic valve.  The patient was then weaned from cardiopulmonary bypass.  The heart was decannulated and protamine was given to reverse the heparin.  The patient's rhythm at separation from bypass was sinus bradycardia.  Total cardiopulmonary bypass  time for the operation was 224 minutes.   The mediastinum and pleural space were inspected for hemostasis and irrigated with saline solution. The mediastinum and bilateral pleural space were drained using fluted chest tubes placed through separate stab incisions inferiorly.  The soft tissues anterior to the aorta were reapproximated loosely. The sternum is closed with double strength sternal wire. The soft tissues anterior to the sternum were closed in multiple layers and the skin is closed with a running subcuticular skin closure.  The post-bypass portion of the operation was notable for stable rhythm and hemodynamics.    Disposition:  The patient tolerated the procedure well and is transported to the surgical intensive care in stable condition. There are no intraoperative complications. All sponge instrument and needle counts are verified correct at completion of the operation.    Brantley FlingB. Zane Shray Hunley, MD 11/30/2018 7:54 PM

## 2018-11-30 NOTE — Transfer of Care (Signed)
Immediate Anesthesia Transfer of Care Note  Patient: Curtis [name]  Procedure(s) Performed: CORONARY ARTERY BYPASS GRAFTING (CABG) x Three , using left internal mammary artery and left leg greater saphenous vein harvested endoscopically (N/A Chest) MITRAL VALVE REPLACEMENT (MVR) USING MAGNA EASE SIZE 36mm (N/A Chest) TRANSESOPHAGEAL ECHOCARDIOGRAM (TEE) (N/A ) INDOCYANINE GREEN FLUORESCENCE IMAGING (ICG) (N/A ) PLACEMENT OF IMPELLA LEFT VENTRICULAR ASSIST DEVICE (N/A )  Patient Location: ICU  Anesthesia Type:General  Level of Consciousness: sedated and Patient remains intubated per anesthesia plan  Airway & Oxygen Therapy: Patient remains intubated per anesthesia plan and Patient placed on Ventilator (see vital sign flow sheet for setting)  Post-op Assessment: Report given to RN and Post -op Vital signs reviewed and stable  Post vital signs: Reviewed and stable  Last Vitals:  Vitals Value Taken Time  BP 85/67 11/30/18 1502  Temp 34.8 C 11/30/18 1504  Pulse 71 11/30/18 1501  Resp 12 11/30/18 1504  SpO2 95 % 11/30/18 1501  Vitals shown include unvalidated device data.  Last Pain:  Vitals:   11/30/18 0438  TempSrc: Oral  PainSc:          Complications: No apparent anesthesia complications

## 2018-11-30 NOTE — H&P (Signed)
History and Physical Interval Note:  11/30/2018 7:14 AM  Curtis [name]  has presented today for surgery, with the diagnosis of Coronary Artery Disease Mitral Regurgitation Mild aortic insufficiency.  The various methods of treatment have been discussed with the patient and family. After consideration of risks, benefits and other options for treatment, the patient has consented to  Procedure(s): CORONARY ARTERY BYPASS GRAFTING (CABG) (N/A) possible MITRAL VALVE REPAIR OR REPLACEMENT (MVR) (N/A) TRANSESOPHAGEAL ECHOCARDIOGRAM (TEE) (N/A) INDOCYANINE GREEN FLUORESCENCE IMAGING (ICG) (N/A) possible,PLACEMENT OF IMPELLA LEFT VENTRICULAR ASSIST DEVICE (N/A) as a surgical intervention.  The patient's history has been reviewed, patient examined, no change in status, stable for surgery.  I have reviewed the patient's chart and labs.  Questions were answered to the patient's satisfaction.     Wonda Olds, MD TCTS [phone]

## 2018-11-30 NOTE — Progress Notes (Signed)
  Echocardiogram Echocardiogram Transesophageal has been performed.  Jannett Celestine 11/30/2018, 8:21 AM

## 2018-11-30 NOTE — Progress Notes (Signed)
TCTS Progress Note  The patient has experienced continued excessive chest tube output after surgery despite correcting coagulopathy. He requires urgent mediastinal re-exploration.  There is no family from which to obtain consent, so this should be performed as an emergency procedure in lieu of typical operative consent.   Curtis [name], [place]

## 2018-11-30 NOTE — Brief Op Note (Signed)
11/23/2018 - 11/30/2018  8:14 AM  PATIENT:  Curtis [name]  68 y.o. male  PRE-OPERATIVE DIAGNOSIS:  Coronary Artery Disease Mitral Regurgitation Mild aortic insufficiency  POST-OPERATIVE DIAGNOSIS:  Coronary Artery Disease Mitral Regurgitation Mild aortic insufficiency  PROCEDURE:  Procedure(s): CORONARY ARTERY BYPASS GRAFTING (CABG) x Three , using left internal mammary artery and left leg greater saphenous vein harvested endoscopically (N/A) MITRAL VALVE REPLACEMENT (MVR) USING MAGNA EASE SIZE 20mm (N/A) TRANSESOPHAGEAL ECHOCARDIOGRAM (TEE) (N/A) INDOCYANINE GREEN FLUORESCENCE IMAGING (ICG) (N/A) PLACEMENT OF IMPELLA LEFT VENTRICULAR ASSIST DEVICE (N/A) LIMA-LAD SVG-DIST CX SVG-DIAG  SURGEON:  Surgeon(s) and Role:    * Atkins, Glenice Bow, MD - Primary  PHYSICIAN ASSISTANT: WAYNE GOLD PA-C  ANESTHESIA:   general  EBL:  900 mL   BLOOD ADMINISTERED:none  DRAINS: 2 PLEURAL AND 2 MEDIASTINAL CHEEST TUBES   LOCAL MEDICATIONS USED:  NONE  SPECIMEN:  Source of Specimen:  MITRAL VALVE LEAFLET  DISPOSITION OF SPECIMEN:  PATHOLOGY  COUNTS:  YES  TOURNIQUET:  * No tourniquets in log *  DICTATION: .Dragon Dictation  PLAN OF CARE: Admit to inpatient   PATIENT DISPOSITION:  ICU - intubated and hemodynamically stable.   Delay start of Pharmacological VTE agent (>24hrs) due to surgical blood loss or risk of bleeding: yes

## 2018-11-30 NOTE — Progress Notes (Signed)
ANTICOAGULATION CONSULT NOTE - Initial Consult  Pharmacy Consult for heparin Indication: Impella  Allergies  Allergen Reactions  . Penicillins Rash    Did it involve swelling of the face/tongue/throat, SOB, or low BP? Unknown Did it involve sudden or severe rash/hives, skin peeling, or any reaction on the inside of your mouth or nose? Yes Did you need to seek medical attention at a hospital or doctor's office? Unknown When did it last happen? childhood If all above answers are "NO", may proceed with cephalosporin use.     Patient Measurements: Height: 5\' 9"  (175.3 cm) Weight: 184 lb (83.5 kg)(scale A) IBW/kg (Calculated) : 70.7  Vital Signs: Temp: 97.8 F (36.6 C) (09/28 0438) Temp Source: Oral (09/28 0438) BP: 133/76 (09/28 0438) Pulse Rate: 66 (09/28 0438)  Labs: Recent Labs    11/29/18 0400 11/29/18 1754 11/30/18 0500  11/30/18 1131 11/30/18 1150 11/30/18 1256 11/30/18 1332 11/30/18 1336  HGB 16.2  --  16.0   < > 9.5* 9.9* 8.8* 9.2* 10.5*  HCT 47.6  --  45.0   < > 28.0* 28.4* 26.0* 27.0* 31.0*  PLT 301  --  246  --   --  139*  --   --   --   HEPARINUNFRC 0.71* 0.42 0.63  --   --   --   --   --   --   CREATININE 1.38*  --  1.45*   < > 1.00  --  1.10  --  1.30*   < > = values in this interval not displayed.    Estimated Creatinine Clearance: 56.7 mL/min (A) (by C-G formula based on SCr of 1.3 mg/dL (H)).   Medical History: Past Medical History:  Diagnosis Date  . Coronary artery disease   . Elevated troponin 11/25/2018  . Tobacco use      Assessment: 50 yoM admitted with NSTEMI now s/p CABG 9/28 with MVR. Pt required Impella 5.0 placement in OR, currently on dextrose purge solution only. Will follow-up transitioning to heparinized purge at discretion of TCTS tomorrow pending chest tube output, coag labs, etc.  Goal of Therapy:  ACT 160-180 Monitor platelets by anticoagulation protocol: Yes   Plan:  -Dextrose purge solution only for  now   Arrie Senate, PharmD, BCPS Clinical Pharmacist [phone] Please check AMION for all [place] numbers 11/30/2018

## 2018-11-30 NOTE — Brief Op Note (Signed)
11/23/2018 - 11/30/2018  10:57 PM  PATIENT:  Curtis [name]  68 y.o. male  PRE-OPERATIVE DIAGNOSIS:  Staus Post coronary artery bypass grafting Sternal Bleed  POST-OPERATIVE DIAGNOSIS:  Staus Post Coronary artery bypass grafting Sternal Bleed, Coagulopathy  PROCEDURE:  Procedure(s): MEDIASTINAL EXPLORATION, CONTROL OF BLEEDING  SURGEON:  Surgeon(s) and Role:    Wonda Olds, MD - Primary  PHYSICIAN ASSISTANT: Maecie Sevcik  ANESTHESIA:   general  EBL:  1000 mL   BLOOD ADMINISTERED:Multiple blood products given including PRBC's,Plts, FFP, Cryoprecipitate.  DRAINS: Right and left pleural drains and mediastinal drains x 2.    LOCAL MEDICATIONS USED:  NONE  SPECIMEN:  No Specimen  DISPOSITION OF SPECIMEN:  N/A  COUNTS:  YES  DICTATION: .Dragon Dictation  PLAN OF CARE: Admit to inpatient   PATIENT DISPOSITION:  ICU - extubated and stable.   Delay start of Pharmacological VTE agent (>24hrs) due to surgical blood loss or risk of bleeding: yes

## 2018-12-01 ENCOUNTER — Inpatient Hospital Stay (HOSPITAL_COMMUNITY): Payer: Medicare Other

## 2018-12-01 ENCOUNTER — Encounter (HOSPITAL_COMMUNITY): Payer: Self-pay | Admitting: Cardiothoracic Surgery

## 2018-12-01 DIAGNOSIS — R570 Cardiogenic shock: Secondary | ICD-10-CM

## 2018-12-01 LAB — CBC
HCT: 30.3 % — ABNORMAL LOW (ref 39.0–52.0)
HCT: 27.2 % — ABNORMAL LOW (ref 39.0–52.0)
Hemoglobin: 10.8 g/dL — ABNORMAL LOW (ref 13.0–17.0)
Hemoglobin: 9.5 g/dL — ABNORMAL LOW (ref 13.0–17.0)
MCH: 32 pg (ref 26.0–34.0)
MCH: 31.4 pg (ref 26.0–34.0)
MCHC: 35.6 g/dL (ref 30.0–36.0)
MCHC: 34.9 g/dL (ref 30.0–36.0)
MCV: 89.6 fL (ref 80.0–100.0)
MCV: 89.8 fL (ref 80.0–100.0)
Platelets: 69 10*3/uL — ABNORMAL LOW (ref 150–400)
Platelets: 70 10*3/uL — ABNORMAL LOW (ref 150–400)
RBC: 3.38 MIL/uL — ABNORMAL LOW (ref 4.22–5.81)
RBC: 3.03 MIL/uL — ABNORMAL LOW (ref 4.22–5.81)
RDW: 15.8 % — ABNORMAL HIGH (ref 11.5–15.5)
RDW: 16.1 % — ABNORMAL HIGH (ref 11.5–15.5)
WBC: 10.9 10*3/uL — ABNORMAL HIGH (ref 4.0–10.5)
WBC: 12.1 10*3/uL — ABNORMAL HIGH (ref 4.0–10.5)
nRBC: 0 % (ref 0.0–0.2)
nRBC: 0 % (ref 0.0–0.2)

## 2018-12-01 LAB — PREPARE FRESH FROZEN PLASMA
Unit division: 0
Unit division: 0
Unit division: 0
Unit division: 0
Unit division: 0
Unit division: 0
Unit division: 0
Unit division: 0
Unit division: 0
Unit division: 0
Unit division: 0
Unit division: 0
Unit division: 0
Unit division: 0

## 2018-12-01 LAB — BPAM FFP
Blood Product Expiration Date: 202010022359
Blood Product Expiration Date: 202010022359
Blood Product Expiration Date: 202010022359
Blood Product Expiration Date: 202010022359
Blood Product Expiration Date: 202010022359
Blood Product Expiration Date: 202010022359
Blood Product Expiration Date: 202010022359
Blood Product Expiration Date: 202010032359
Blood Product Expiration Date: 202010032359
Blood Product Expiration Date: 202010032359
Blood Product Expiration Date: 202010032359
Blood Product Expiration Date: 202010032359
Blood Product Expiration Date: 202010032359
Blood Product Expiration Date: 202010032359
Blood Product Expiration Date: 202010032359
ISSUE DATE / TIME: 202009281357
ISSUE DATE / TIME: 202009281752
ISSUE DATE / TIME: 202009281752
ISSUE DATE / TIME: 202009282106
ISSUE DATE / TIME: 202009282109
ISSUE DATE / TIME: 202009282109
ISSUE DATE / TIME: 202009282112
ISSUE DATE / TIME: 202009282151
ISSUE DATE / TIME: 202009282151
ISSUE DATE / TIME: 202009282151
ISSUE DATE / TIME: 202009282151
ISSUE DATE / TIME: 202009290837
ISSUE DATE / TIME: 202009290837
ISSUE DATE / TIME: 202009290837
ISSUE DATE / TIME: 202009290837
Unit Type and Rh: 5100
Unit Type and Rh: 5100
Unit Type and Rh: 5100
Unit Type and Rh: 5100
Unit Type and Rh: 5100
Unit Type and Rh: 5100
Unit Type and Rh: 5100
Unit Type and Rh: 6200
Unit Type and Rh: 6200
Unit Type and Rh: 6200
Unit Type and Rh: 6200
Unit Type and Rh: 6200
Unit Type and Rh: 6200
Unit Type and Rh: 6200
Unit Type and Rh: 9500

## 2018-12-01 LAB — MAGNESIUM
Magnesium: 2.6 mg/dL — ABNORMAL HIGH (ref 1.7–2.4)
Magnesium: 2 mg/dL (ref 1.7–2.4)

## 2018-12-01 LAB — BPAM PLATELET PHERESIS
Blood Product Expiration Date: 202009292359
Blood Product Expiration Date: 202010012359
ISSUE DATE / TIME: 202009281809
ISSUE DATE / TIME: 202009282153
Unit Type and Rh: 6200
Unit Type and Rh: 8400

## 2018-12-01 LAB — COOXEMETRY PANEL
Carboxyhemoglobin: 1.1 % (ref 0.5–1.5)
Methemoglobin: 0.9 % (ref 0.0–1.5)
O2 Saturation: 62.7 %
Total hemoglobin: 10 g/dL — ABNORMAL LOW (ref 12.0–16.0)

## 2018-12-01 LAB — PREPARE PLATELET PHERESIS
Unit division: 0
Unit division: 0

## 2018-12-01 LAB — BPAM CRYOPRECIPITATE
Blood Product Expiration Date: 202009282237
Blood Product Expiration Date: 202009290359
Blood Product Expiration Date: 202009290401
ISSUE DATE / TIME: 202009281721
ISSUE DATE / TIME: 202009282215
ISSUE DATE / TIME: 202009282217
Unit Type and Rh: 5100
Unit Type and Rh: 5100
Unit Type and Rh: 5100

## 2018-12-01 LAB — BASIC METABOLIC PANEL
Anion gap: 8 (ref 5–15)
Anion gap: 10 (ref 5–15)
BUN: 24 mg/dL — ABNORMAL HIGH (ref 8–23)
BUN: 22 mg/dL (ref 8–23)
CO2: 23 mmol/L (ref 22–32)
CO2: 25 mmol/L (ref 22–32)
Calcium: 8.1 mg/dL — ABNORMAL LOW (ref 8.9–10.3)
Calcium: 7.5 mg/dL — ABNORMAL LOW (ref 8.9–10.3)
Chloride: 108 mmol/L (ref 98–111)
Chloride: 106 mmol/L (ref 98–111)
Creatinine, Ser: 1.34 mg/dL — ABNORMAL HIGH (ref 0.61–1.24)
Creatinine, Ser: 1.36 mg/dL — ABNORMAL HIGH (ref 0.61–1.24)
GFR calc Af Amer: >60 mL/min (ref >60)
GFR calc Af Amer: >60 mL/min (ref >60)
GFR calc non Af Amer: 55 mL/min — ABNORMAL LOW (ref >60)
GFR calc non Af Amer: 54 mL/min — ABNORMAL LOW (ref >60)
Glucose, Bld: 122 mg/dL — ABNORMAL HIGH (ref 70–99)
Glucose, Bld: 102 mg/dL — ABNORMAL HIGH (ref 70–99)
Potassium: 4.1 mmol/L (ref 3.5–5.1)
Potassium: 3.5 mmol/L (ref 3.5–5.1)
Sodium: 139 mmol/L (ref 135–145)
Sodium: 141 mmol/L (ref 135–145)

## 2018-12-01 LAB — PREPARE CRYOPRECIPITATE
Unit division: 0
Unit division: 0
Unit division: 0

## 2018-12-01 LAB — GLUCOSE, CAPILLARY
Glucose-Capillary: 119 mg/dL — ABNORMAL HIGH (ref 70–99)
Glucose-Capillary: 114 mg/dL — ABNORMAL HIGH (ref 70–99)
Glucose-Capillary: 103 mg/dL — ABNORMAL HIGH (ref 70–99)
Glucose-Capillary: 108 mg/dL — ABNORMAL HIGH (ref 70–99)
Glucose-Capillary: 100 mg/dL — ABNORMAL HIGH (ref 70–99)
Glucose-Capillary: 96 mg/dL (ref 70–99)
Glucose-Capillary: 86 mg/dL (ref 70–99)
Glucose-Capillary: 112 mg/dL — ABNORMAL HIGH (ref 70–99)
Glucose-Capillary: 120 mg/dL — ABNORMAL HIGH (ref 70–99)
Glucose-Capillary: 131 mg/dL — ABNORMAL HIGH (ref 70–99)
Glucose-Capillary: 142 mg/dL — ABNORMAL HIGH (ref 70–99)
Glucose-Capillary: 135 mg/dL — ABNORMAL HIGH (ref 70–99)
Glucose-Capillary: 104 mg/dL — ABNORMAL HIGH (ref 70–99)
Glucose-Capillary: 102 mg/dL — ABNORMAL HIGH (ref 70–99)
Glucose-Capillary: 101 mg/dL — ABNORMAL HIGH (ref 70–99)
Glucose-Capillary: 101 mg/dL — ABNORMAL HIGH (ref 70–99)
Glucose-Capillary: 105 mg/dL — ABNORMAL HIGH (ref 70–99)
Glucose-Capillary: 110 mg/dL — ABNORMAL HIGH (ref 70–99)
Glucose-Capillary: 106 mg/dL — ABNORMAL HIGH (ref 70–99)
Glucose-Capillary: 107 mg/dL — ABNORMAL HIGH (ref 70–99)
Glucose-Capillary: 103 mg/dL — ABNORMAL HIGH (ref 70–99)
Glucose-Capillary: 100 mg/dL — ABNORMAL HIGH (ref 70–99)
Glucose-Capillary: 102 mg/dL — ABNORMAL HIGH (ref 70–99)

## 2018-12-01 LAB — POCT I-STAT 7, (LYTES, BLD GAS, ICA,H+H)
Acid-base deficit: 5 mmol/L — ABNORMAL HIGH (ref 0.0–2.0)
Acid-base deficit: 8 mmol/L — ABNORMAL HIGH (ref 0.0–2.0)
Acid-base deficit: 2 mmol/L (ref 0.0–2.0)
Acid-base deficit: 1 mmol/L (ref 0.0–2.0)
Bicarbonate: 19.3 mmol/L — ABNORMAL LOW (ref 20.0–28.0)
Bicarbonate: 21.8 mmol/L (ref 20.0–28.0)
Bicarbonate: 23.9 mmol/L (ref 20.0–28.0)
Bicarbonate: 24.6 mmol/L (ref 20.0–28.0)
Calcium, Ion: 0.82 mmol/L — CL (ref 1.15–1.40)
Calcium, Ion: 1.25 mmol/L (ref 1.15–1.40)
Calcium, Ion: 1.21 mmol/L (ref 1.15–1.40)
Calcium, Ion: 1.19 mmol/L (ref 1.15–1.40)
HCT: 23 % — ABNORMAL LOW (ref 39.0–52.0)
HCT: 30 % — ABNORMAL LOW (ref 39.0–52.0)
HCT: 29 % — ABNORMAL LOW (ref 39.0–52.0)
HCT: 28 % — ABNORMAL LOW (ref 39.0–52.0)
Hemoglobin: 10.2 g/dL — ABNORMAL LOW (ref 13.0–17.0)
Hemoglobin: 7.8 g/dL — ABNORMAL LOW (ref 13.0–17.0)
Hemoglobin: 9.9 g/dL — ABNORMAL LOW (ref 13.0–17.0)
Hemoglobin: 9.5 g/dL — ABNORMAL LOW (ref 13.0–17.0)
O2 Saturation: 98 %
O2 Saturation: 99 %
O2 Saturation: 90 %
O2 Saturation: 97 %
Patient temperature: 35
Patient temperature: 35.7
Patient temperature: 36.8
Potassium: 4.7 mmol/L (ref 3.5–5.1)
Potassium: 4.8 mmol/L (ref 3.5–5.1)
Potassium: 4 mmol/L (ref 3.5–5.1)
Potassium: 4 mmol/L (ref 3.5–5.1)
Sodium: 143 mmol/L (ref 135–145)
Sodium: 143 mmol/L (ref 135–145)
Sodium: 142 mmol/L (ref 135–145)
Sodium: 142 mmol/L (ref 135–145)
TCO2: 21 mmol/L — ABNORMAL LOW (ref 22–32)
TCO2: 23 mmol/L (ref 22–32)
TCO2: 25 mmol/L (ref 22–32)
TCO2: 26 mmol/L (ref 22–32)
pCO2 arterial: 44.1 mmHg (ref 32.0–48.0)
pCO2 arterial: 45 mmHg (ref 32.0–48.0)
pCO2 arterial: 41.4 mmHg (ref 32.0–48.0)
pCO2 arterial: 43.1 mmHg (ref 32.0–48.0)
pH, Arterial: 7.239 — ABNORMAL LOW (ref 7.350–7.450)
pH, Arterial: 7.292 — ABNORMAL LOW (ref 7.350–7.450)
pH, Arterial: 7.363 (ref 7.350–7.450)
pH, Arterial: 7.363 (ref 7.350–7.450)
pO2, Arterial: 124 mmHg — ABNORMAL HIGH (ref 83.0–108.0)
pO2, Arterial: 149 mmHg — ABNORMAL HIGH (ref 83.0–108.0)
pO2, Arterial: 56 mmHg — ABNORMAL LOW (ref 83.0–108.0)
pO2, Arterial: 89 mmHg (ref 83.0–108.0)

## 2018-12-01 LAB — APTT: aPTT: 37 seconds — ABNORMAL HIGH (ref 24–36)

## 2018-12-01 LAB — LACTATE DEHYDROGENASE: LDH: 469 U/L — ABNORMAL HIGH (ref 98–192)

## 2018-12-01 LAB — POCT ACTIVATED CLOTTING TIME: Activated Clotting Time: 142 seconds

## 2018-12-01 LAB — SURGICAL PATHOLOGY

## 2018-12-01 MED ORDER — ALBUMIN HUMAN 5 % IV SOLN
12.5000 g | Freq: Once | INTRAVENOUS | Status: AC
  Administered 2018-12-01: 04:00:00 12.5 g via INTRAVENOUS

## 2018-12-01 MED ORDER — POTASSIUM CHLORIDE 10 MEQ/50ML IV SOLN
10.0000 meq | INTRAVENOUS | Status: AC
  Administered 2018-12-01 (×3): 10 meq via INTRAVENOUS
  Filled 2018-12-01 (×2): qty 50

## 2018-12-01 MED ORDER — ALBUMIN HUMAN 5 % IV SOLN
INTRAVENOUS | Status: AC
  Administered 2018-12-01: 12.5 g via INTRAVENOUS
  Filled 2018-12-01: qty 250

## 2018-12-01 MED ORDER — HEPARIN SODIUM (PORCINE) 5000 UNIT/ML IJ SOLN
50000.0000 [IU] | INTRAVENOUS | Status: DC
  Administered 2018-12-01 – 2018-12-02 (×2): 50000 [IU]
  Filled 2018-12-01 (×3): qty 10

## 2018-12-01 MED ORDER — FUROSEMIDE 10 MG/ML IJ SOLN
60.0000 mg | Freq: Two times a day (BID) | INTRAMUSCULAR | Status: DC
  Administered 2018-12-01 – 2018-12-02 (×3): 60 mg via INTRAVENOUS
  Filled 2018-12-01 (×3): qty 6

## 2018-12-01 MED FILL — Lidocaine HCl Local Preservative Free (PF) Inj 2%: INTRAMUSCULAR | Qty: 15 | Status: AC

## 2018-12-01 MED FILL — Potassium Chloride Inj 2 mEq/ML: INTRAVENOUS | Qty: 40 | Status: AC

## 2018-12-01 MED FILL — Rifampin For Inj 600 MG: INTRAVENOUS | Qty: 600 | Status: AC

## 2018-12-01 MED FILL — Heparin Sodium (Porcine) Inj 1000 Unit/ML: INTRAMUSCULAR | Qty: 30 | Status: AC

## 2018-12-01 NOTE — Transfer of Care (Signed)
Immediate Anesthesia Transfer of Care Note  Patient: Curtis [name]  Procedure(s) Performed: MEDIASTINAL POST-OP BLEED (N/A Chest)  Patient Location: ICU  Anesthesia Type:General  Level of Consciousness: sedated and Patient remains intubated per anesthesia plan  Airway & Oxygen Therapy: Patient remains intubated per anesthesia plan and Patient placed on Ventilator (see vital sign flow sheet for setting)  Post-op Assessment: Report given to RN and Post -op Vital signs reviewed and stable  Post vital signs: Reviewed and stable  Last Vitals:  Vitals Value Taken Time  BP    Temp    Pulse    Resp    SpO2      Last Pain:  Vitals:   11/30/18 2007  TempSrc: Core  PainSc:          Complications: No apparent anesthesia complications

## 2018-12-01 NOTE — Progress Notes (Signed)
Initial Nutrition Assessment  RD working remotely.  DOCUMENTATION CODES:   Not applicable  INTERVENTION:   If unable to extubate, recommend initiation of tube feeds: - Vital AF 1.2 @ 55 ml/hr (1320 ml/day) - Pro-stat 30 ml daily  Recommended tube feeding regimen provides 1684 kcal, 114 grams of protein, and 1071 ml of H2O.   NUTRITION DIAGNOSIS:   Inadequate oral intake related to inability to eat as evidenced by NPO status.  GOAL:   Patient will meet greater than or equal to 90% of their needs  MONITOR:   Vent status, Labs, Weight trends, Skin, I & O's  REASON FOR ASSESSMENT:   Ventilator    ASSESSMENT:   68 year old male who presented to the ED on 9/21 with SOB. PMH of tobacco use. Pt admitted with acute hypoxemic hypercapnic respiratory failure and elevated troponin/NSTEMI. Pt also found to have CHF.   9/22 - cath showing marked LV dysfunction and multivessel CAD 9/24 - TEE 9/28 - s/p CABG x 3, mitral valve replacement, Impella placement; s/p urgent mediastinal exploration for control of bleeding  Pt remains intubated post-op. RD will leave TF recommendations for use if extubation is unsuccessful.  OG tube in place.  Weight up significantly over the last day. Will utilize 83.2 kg as EDW. Per RN edema assessment, pt with mild pitting generalized edema.  Patient is currently intubated on ventilator support MV: 6.7 L/min Temp (24hrs), Avg:97.4 F (36.3 C), Min:95.5 F (35.3 C), Max:99 F (37.2 C) BP (a-line): 96/55 MAP (a-line): 65  Drips: Precedex: 6.3 ml/hr Epinephrine: 11.2 ml/hr Milrinone: 6.3 ml/hr Nitroglycerin: 3 ml/hr Insulin: 2.7 ml/hr LR: 100 ml/hr  Medications reviewed and include: Dulcolax, Colace, Lasix, insulin TID, Protonix  Labs reviewed: BUN 24, creatinine 1.34, magnesium 2.6, hemoglobin 9.5 CBG's: 86-142 x 12 hours  UOP: 1440 ml x 24 hours CT: 3180 ml x 24 hours I/O's: +4.1 L since admit  NUTRITION - FOCUSED PHYSICAL  EXAM:  Unable to complete at this time. RD working remotely.  Diet Order:   Diet Order    None      EDUCATION NEEDS:   No education needs have been identified at this time  Skin:  Skin Assessment: Skin Integrity Issues: Skin Integrity Issues: Incisions: closed incisions to chest and left leg  Last BM:  no documented BM  Height:   Ht Readings from Last 1 Encounters:  11/24/18 5\' 9"  (1.753 m)    Weight:   Wt Readings from Last 1 Encounters:  12/01/18 95.1 kg    Ideal Body Weight:  72.7 kg  BMI:  Body mass index is 30.96 kg/m.  Estimated Nutritional Needs:   Kcal:  1722  Protein:  110-130 grams  Fluid:  >/= 1.7 L    Gaynell Face, MS, RD, LDN Inpatient Clinical Dietitian Pager: [phone] Weekend/After Hours: [phone]

## 2018-12-01 NOTE — Progress Notes (Signed)
Pt ETT still with pink tape from OR. No plans to extubate this evening per Dr. Orvan Seen. Pink tape removed and hollister ETT holder placed on pt. ETT repositioned to the center and pt suctioned via ETT and orally. Pt tolerated well. RT will continue to monitor.

## 2018-12-01 NOTE — Progress Notes (Signed)
ANTICOAGULATION CONSULT NOTE - Follow Up Consult  Pharmacy Consult for Heparin Indication: Impella 5.0  Allergies  Allergen Reactions  . Penicillins Rash    Did it involve swelling of the face/tongue/throat, SOB, or low BP? Unknown Did it involve sudden or severe rash/hives, skin peeling, or any reaction on the inside of your mouth or nose? Yes Did you need to seek medical attention at a hospital or doctor's office? Unknown When did it last happen? childhood If all above answers are "NO", may proceed with cephalosporin use.     Patient Measurements: Height: 5' 9" (175.3 cm) Weight: 209 lb 10.5 oz (95.1 kg) IBW/kg (Calculated) : 70.7 Heparin Dosing Weight: 90.4 kg  Vital Signs: Temp: 98.2 F (36.8 C) (09/29 1500) Temp Source: Core (09/29 1200) BP: 116/65 (09/29 1500) Pulse Rate: 78 (09/29 1500)  Labs: Recent Labs    11/29/18 0400 11/29/18 1754 11/30/18 0500  11/30/18 1336  11/30/18 1530  11/30/18 2019 11/30/18 2047  11/30/18 2228 12/01/18 0114 12/01/18 0127 12/01/18 0507  HGB 16.2  --  16.0   < > 10.5*   < > 8.5*   < > 7.3*  --    < >  --  9.9* 10.8* 9.5*  HCT 47.6  --  45.0   < > 31.0*   < > 25.7*   < > 20.6*  --    < >  --  29.0* 30.3* 28.0*  PLT 301  --  246   < >  --   --  127*  --  117*  --   --  74*  --  69*  --   APTT  --   --   --   --   --    < > 44*  --  51*  --   --  42*  --  37*  --   LABPROT  --   --   --   --   --   --  20.3*  --  19.4*  --   --  17.9*  --   --   --   INR  --   --   --   --   --   --  1.8*  --  1.7*  --   --  1.5*  --   --   --   HEPARINUNFRC 0.71* 0.42 0.63  --   --   --   --   --   --   --   --   --   --   --   --   CREATININE 1.38*  --  1.45*   < > 1.30*  --   --   --   --  1.35*  --   --   --  1.34*  --    < > = values in this interval not displayed.    Estimated Creatinine Clearance: 62.6 mL/min (A) (by C-G formula based on SCr of 1.34 mg/dL (H)).   Medications:  Scheduled:  . acetaminophen  1,000 mg Oral Q6H   Or   . acetaminophen (TYLENOL) oral liquid 160 mg/5 mL  1,000 mg Per Tube Q6H  . aspirin EC  325 mg Oral Daily   Or  . aspirin  324 mg Per Tube Daily  . bisacodyl  10 mg Oral Daily   Or  . bisacodyl  10 mg Rectal Daily  . chlorhexidine gluconate (MEDLINE KIT)  15 mL Mouth Rinse BID  . Chlorhexidine Gluconate   Cloth  6 each Topical Daily  . docusate sodium  200 mg Oral Daily  . furosemide  60 mg Intravenous BID  . insulin regular  0-10 Units Intravenous TID WC  . mouth rinse  15 mL Mouth Rinse 10 times per day  . [START ON 12/02/2018] pantoprazole  40 mg Oral Daily  . sodium chloride flush  3 mL Intravenous Q12H   Infusions:  . sodium chloride 20 mL/hr at 12/01/18 1500  . sodium chloride    . sodium chloride 10 mL (11/30/18 1500)  . amiodarone Stopped (11/30/18 1700)  . dexmedetomidine (PRECEDEX) IV infusion 0.2 mcg/kg/hr (12/01/18 1500)  . epinephrine 3 mcg/min (12/01/18 1500)  . impella catheter heparin 50 unit/mL in dextrose 5%    . insulin 2 mL/hr at 12/01/18 1500  . lactated ringers    . lactated ringers 100 mL/hr at 12/01/18 1500  . milrinone 0.25 mcg/kg/min (12/01/18 1500)  . nitroGLYCERIN 10 mcg/min (12/01/18 1500)  . norepinephrine (LEVOPHED) Adult infusion Stopped (11/30/18 2233)  . phenylephrine (NEO-SYNEPHRINE) Adult infusion Stopped (11/30/18 2223)  . vasopressin (PITRESSIN) infusion - *FOR SHOCK* Stopped (11/30/18 2331)    Assessment: 67 yoM admitted with NSTEMI now s/p CABG 9/28 with MVR. Pt required Impella 5.0 placement in OR. He was noted to have significant coagulopathy and had to be taken back to OR urgently for mediastinal post-op bleeding.  Due to high risk of bleeding, will ensure that patient does not receive > 1,000 units/hr total. Current purge infusion rate delivering ~ 950 units/hr of heparin.  Goal of Therapy:  ACT 160-180 Monitor platelets by anticoagulation protocol: Yes   Plan:  Dextrose + heparin purge solution only for now Will not start IV  heparin to titrate to goal ACT for today per Dr. Atkins. F/U on when necessary to start IV heparin.  Kushal Naik, PharmD PGY1 Acute Care Pharmacy Resident [phone] 12/01/2018,3:33 PM  

## 2018-12-01 NOTE — Progress Notes (Signed)
EVENING ROUNDS NOTE :     Curtis [name].[address]       [place] [address]             [phone]                 1 Day Post-Op Procedure(s) (LRB): MEDIASTINAL POST-OP BLEED (N/A)   Total Length of Stay:  LOS: 7 days  Events:  Not bleeding Good hemodynamic on epi 3, milr and nitro.   Impella P6 flow 3.5  Wean to extubate    BP 106/60 (BP Location: Right Arm)   Pulse 78   Temp 98.4 F (36.9 C) (Core)   Resp (!) 21   Ht 5\' 9"  (1.753 m)   Wt 95.1 kg   SpO2 94%   BMI 30.96 kg/m   PAP: (23-41)/(13-23) 26/14 CVP:  [8 mmHg-16 mmHg] 8 mmHg CO:  [2.1 L/min-6.7 L/min] 5.5 L/min CI:  [1.1 L/min/m2-3.4 L/min/m2] 2.8 L/min/m2  Vent Mode: CPAP;PSV FiO2 (%):  [40 %-100 %] 40 % Set Rate:  [4 bmp-12 bmp] 4 bmp Vt Set:  [570 mL] 570 mL PEEP:  [5 cmH20] 5 cmH20 Pressure Support:  [10 cmH20] 10 cmH20 Plateau Pressure:  [19 cmH20-27 cmH20] 22 cmH20  . sodium chloride 20 mL/hr at 12/01/18 1600  . sodium chloride    . sodium chloride 10 mL (11/30/18 1500)  . amiodarone Stopped (11/30/18 1700)  . dexmedetomidine (PRECEDEX) IV infusion 0.2 mcg/kg/hr (12/01/18 1600)  . epinephrine 3 mcg/min (12/01/18 1600)  . impella catheter heparin 50 unit/mL in dextrose 5%    . insulin 2 mL/hr at 12/01/18 1600  . lactated ringers    . lactated ringers 100 mL/hr at 12/01/18 1600  . milrinone 0.25 mcg/kg/min (12/01/18 1600)  . nitroGLYCERIN 10 mcg/min (12/01/18 1600)  . norepinephrine (LEVOPHED) Adult infusion Stopped (11/30/18 2233)  . phenylephrine (NEO-SYNEPHRINE) Adult infusion Stopped (11/30/18 2223)  . potassium chloride    . vasopressin (PITRESSIN) infusion - *FOR SHOCK* Stopped (11/30/18 2331)    I/O last 3 completed shifts: In: 15740.8 [I.V.:6467.8; Blood:7268.6; Other:241.1; IV Piggyback:1763.3] Out: 6520 [Urine:1440; Blood:1900; Chest Tube:3180]   CBC Latest Ref Rng & Units 12/01/2018 12/01/2018 12/01/2018  WBC 4.0 - 10.5 K/uL 12.1(H) - 10.9(H)  Hemoglobin 13.0 - 17.0 g/dL  9.5(L) 9.5(L) 10.8(L)  Hematocrit 39.0 - 52.0 % 27.2(L) 28.0(L) 30.3(L)  Platelets 150 - 400 K/uL 70(L) - 69(L)    BMP Latest Ref Rng & Units 12/01/2018 12/01/2018 12/01/2018  Glucose 70 - 99 mg/dL 102(H) - 122(H)  BUN 8 - 23 mg/dL 22 - 24(H)  Creatinine 0.61 - 1.24 mg/dL 1.36(H) - 1.34(H)  Sodium 135 - 145 mmol/L 141 142 139  Potassium 3.5 - 5.1 mmol/L 3.5 4.0 4.1  Chloride 98 - 111 mmol/L 106 - 108  CO2 22 - 32 mmol/L 25 - 23  Calcium 8.9 - 10.3 mg/dL 7.5(L) - 8.1(L)    ABG    Component Value Date/Time   PHART 7.363 12/01/2018 0507   PCO2ART 43.1 12/01/2018 0507   PO2ART 89.0 12/01/2018 0507   HCO3 24.6 12/01/2018 0507   TCO2 26 12/01/2018 0507   ACIDBASEDEF 1.0 12/01/2018 0507   O2SAT 62.7 12/01/2018 0730       Melodie Bouillon, MD 12/01/2018 4:47 PM

## 2018-12-01 NOTE — Addendum Note (Signed)
Addendum  created 12/01/18 0945 by Josephine Igo, CRNA   Order list changed

## 2018-12-01 NOTE — Consult Note (Signed)
Advanced Heart Failure Team Consult Note   Primary Physician: Patient, No Pcp Per PCP-Cardiologist:  Pixie Casino, MD   Consulting: Orvan Seen  Reason for Consultation: Post-op shock  HPI:    Finnick Orosz is seen today for evaluation of cardiogenic shock  Mr. Clingerman is a 68 year old male with h/o tobacco use and probable COPD. Presented to the hospital via EMS on 11/23/18 when he says he was awakened from sleep with acute onset shortness of breath.  He denied having any chest pain at the time nor has he had any chest pain since then.  On arrival to the emergency room, he was noted to be diaphoretic, tachypneic, and tachycardic.  EKG demonstrated a narrow complex tachycardia with a heart rate of around 140.  He was treated with adenosine and his heart rate declined to about 104 with evidence of left bundle branch block.  Further work-up in the emergency room included chest x-ray that showed pulmonary edema consistent with acute heart failure.  His initial troponin was elevated at 63 but later serial studies increased to 4400 then further increased to in excess of 9000.  He had left heart catheterization which demonstrated an occluded right coronary artery.  There was a 30 to 40% left main coronary artery stenosis.  The LAD had a 50% proximal stenosis followed by a 70% proximal stenosis.  The first diagonal was severely diseased with an 80% stenosis.  The circumflex was codominant with an 85% mid to distal stenosis.  Ejection fraction was estimated at 25 to 30%.  Echocardiogram has been completed and showed an estimated ejection fraction of 20 to 25%.  There was moderate to severe mitral insufficiency and mild to moderate aortic insufficiency.    He underwent CABG and bioprosthetic MVR  (LIMA - LAD, SVG - OM, SVG - Diag) and Impella LD placement on 9/28 by Dr. Orvan Seen. Taken back emergently last night for severe bleeding. Got numerous units of cells   Now back in ICU. Remains epi 3, Milrinone 0.25.  On  vent at 90% Fio@  [facility]  CVP 10-12 PA 28/18 (22)  Thermo 4.3/2.2  On Impella at p-6 (3.6L) Waveforms ok.     Review of Systems: Unavailable due intubated/sedated    Home Medications Prior to Admission medications   Not on File    Past Medical History: Past Medical History:  Diagnosis Date  . Coronary artery disease   . Elevated troponin 11/25/2018  . Tobacco use     Past Surgical History: Past Surgical History:  Procedure Laterality Date  . LEFT HEART CATH AND CORONARY ANGIOGRAPHY N/A 11/24/2018   Procedure: LEFT HEART CATH AND CORONARY ANGIOGRAPHY;  Surgeon: Belva Crome, MD;  Location: [facility];  Service: Cardiovascular;  Laterality: N/A;  . TEE WITHOUT CARDIOVERSION N/A 11/26/2018   Procedure: TRANSESOPHAGEAL ECHOCARDIOGRAM (TEE);  Surgeon: Jerline Pain, MD;  Location: [facility];  Service: Cardiovascular;  Laterality: N/A;    Family History: Family History  Problem Relation Age of Onset  . Heart attack Father   . Heart attack Mother     Social History: Social History   Socioeconomic History  . Marital status: Single    Spouse name: Not on file  . Number of children: Not on file  . Years of education: Not on file  . Highest education level: Not on file  Occupational History  . Not on file  Social Needs  . Financial resource strain: Not on file  . Food insecurity  Worry: Not on file    Inability: Not on file  . Transportation needs    Medical: Not on file    Non-medical: Not on file  Tobacco Use  . Smoking status: Current Some Day Smoker    Packs/day: 0.25    Types: Cigarettes  . Smokeless tobacco: Never Used  Substance and Sexual Activity  . Alcohol use: Never    Frequency: Never  . Drug use: Never  . Sexual activity: Not on file  Lifestyle  . Physical activity    Days per week: Not on file    Minutes per session: Not on file  . Stress: Not on file  Relationships  . Social Herbalist on phone: Not on file     Gets together: Not on file    Attends religious service: Not on file    Active member of club or organization: Not on file    Attends meetings of clubs or organizations: Not on file    Relationship status: Not on file  Other Topics Concern  . Not on file  Social History Narrative  . Not on file    Allergies:  Allergies  Allergen Reactions  . Penicillins Rash    Did it involve swelling of the face/tongue/throat, SOB, or low BP? Unknown Did it involve sudden or severe rash/hives, skin peeling, or any reaction on the inside of your mouth or nose? Yes Did you need to seek medical attention at a hospital or doctor's office? Unknown When did it last happen? childhood If all above answers are "NO", may proceed with cephalosporin use.     Objective:    Vital Signs:   Temp:  [95.5 F (35.3 C)-99 F (37.2 C)] 97.9 F (36.6 C) (09/29 0700) Pulse Rate:  [69-92] 78 (09/29 0700) Resp:  [11-20] 12 (09/29 0700) BP: (44-178)/(19-86) 178/86 (09/29 0354) SpO2:  [92 %-100 %] 99 % (09/29 0700) Arterial Line BP: (55-317)/(36-91) 99/59 (09/29 0700) FiO2 (%):  [50 %-100 %] 100 % (09/29 0400) Weight:  [95.1 kg] 95.1 kg (09/29 0400) Last BM Date: (UTA)  Weight change: Filed Weights   11/29/18 0536 11/30/18 0438 12/01/18 0400  Weight: 83.2 kg 83.5 kg 95.1 kg    Intake/Output:   Intake/Output Summary (Last 24 hours) at 12/01/2018 0705 Last data filed at 12/01/2018 0700 Gross per 24 hour  Intake 15740.78 ml  Output 6520 ml  Net 9220.78 ml      Physical Exam    General:  Intubated sedated HEENT: normal + ETT Neck: supple. RIJ swan . Carotids 2+ bilat; no bruits. No lymphadenopathy or thyromegaly appreciated. Cor: PMI nondisplaced. Regular rate & rhythm. Sternal and impella dressing ok  CTs ok Lungs: caorse Abdomen: obese soft, nontender, nondistended. No hepatosplenomegaly. No bruits or masses. Hypoactivebowel sounds. Extremities: no cyanosis, clubbing, rash, trace edema Neuro:  intubated/sedated   Telemetry   AV paced 70-80s Personally reviewed   Labs   Basic Metabolic Panel: Recent Labs  Lab 11/26/18 0158  11/28/18 0206 11/29/18 0400 11/30/18 0500  11/30/18 1131 11/30/18 1256  11/30/18 1336  11/30/18 1924 11/30/18 2019 11/30/18 2047 12/01/18 0114 12/01/18 0127 12/01/18 0507  NA 138   < > 136 137 139   < > 136 137   < > 139   < > 141  --  139 142 139 142  K 3.3*   < > 3.4* 3.6 3.5   < > 4.2 4.5   < > 3.9   < > 4.3  --  4.4 4.0 4.1 4.0  CL 98   < > 99 102 103   < > 100 102  --  101  --   --   --  109  --  108  --   CO2 28   < > _0 --   --   --   --   --   --   --   --  21*  --  23  --   GLUCOSE 136*   < > 149* 143* 153*   < > 117* 144*  --  146*  --   --   --  110*  --  122*  --   BUN 25*   < > 26* 32* 31*   < > 30* 33*  --  33*  --   --   --  27*  --  24*  --   CREATININE 1.18   < > 1.24 1.38* 1.45*   < > 1.00 1.10  --  1.30*  --   --   --  1.35*  --  1.34*  --   CALCIUM 8.8*   < > 8.8* 8.9 8.9  --   --   --   --   --   --   --   --  8.6*  --  8.1*  --   MG 1.9  --   --  2.0 2.0  --   --   --   --   --   --   --  3.2*  --   --  2.6*  --    < > = values in this interval not displayed.    Liver Function Tests: Recent Labs  Lab 11/25/18 0834 11/26/18 0158 11/29/18 0400 11/30/18 0500  AST 33 _1 ALT _2 37  ALKPHOS 70 71 75 78  BILITOT 1.3* 2.5* 2.0* 2.3*  PROT 6.7 6.9 7.2 6.9  ALBUMIN 3.5 3.8 4.0 3.9   No results for input(s): LIPASE, AMYLASE in the last 168 hours. No results for input(s): AMMONIA in the last 168 hours.  CBC: Recent Labs  Lab 11/29/18 0400 11/30/18 0500  11/30/18 1150  11/30/18 1530 11/30/18 1924 11/30/18 2019 11/30/18 2228 12/01/18 0114 12/01/18 0127 12/01/18 0507  WBC 14.8* 12.7*  --   --   --  24.1*  --  17.0*  --   --  10.9*  --   HGB 16.2 16.0   < > 9.9*   < > 8.5* 6.8* 7.3*  --  9.9* 10.8* 9.5*  HCT 47.6 45.0   < > 28.4*   < > 25.7* 20.0* 20.6*  --  29.0* 30.3* 28.0*  MCV 97.7 96.8   --   --   --  101.2*  --  92.4  --   --  89.6  --   PLT 301 246  --  139*  --  127*  --  117* 74*  --  69*  --    < > = values in this interval not displayed.    Cardiac Enzymes: No results for input(s): CKTOTAL, CKMB, CKMBINDEX, TROPONINI in the last 168 hours.  BNP: BNP (last 3 results) Recent Labs    11/23/18 2328  BNP 507.9*    ProBNP (last 3 results) No results for input(s): PROBNP in the last 8760 hours.   CBG: Recent Labs  Lab 12/01/18 0214 12/01/18 9753 12/01/18 0407 12/01/18 0505 12/01/18 0051  GLUCAP 114* 103* 108* 100* 96    Coagulation Studies: Recent Labs    11/30/18 1530 11/30/18 2019 11/30/18 2228  LABPROT 20.3* 19.4* 17.9*  INR 1.8* 1.7* 1.5*     Imaging   Dg Chest 1 View  Result Date: 12/01/2018 CLINICAL DATA:  Status post open heart surgery. EXAM: CHEST  1 VIEW COMPARISON:  Radiograph yesterday at 15:35 p.m. FINDINGS: Endotracheal tube tip 2.3 cm from the carina. Tip and side port of enteric tube below the diaphragm in the stomach. Right internal jugular Swan-Ganz catheter tip in the region of the main pulmonary outflow tract. Left and right chest tubes and mediastinal drains in place. Impella device in place. Post median sternotomy. Cardiomegaly with prosthetic valve. Similar upper mediastinal widening. Right apical opacity may be layering pleural fluid. No visualized pneumothorax. Increased right infrahilar opacity favors atelectasis. IMPRESSION: 1. Support apparatus as described. 2. Similar upper mediastinal widening, patient had tortuous vasculature on preoperative imaging. 3. Increased right infrahilar opacity favors atelectasis. Probable right pleural effusion. Electronically Signed   By: Keith Rake M.D.   On: 12/01/2018 00:28   Dg Chest Port 1 View  Result Date: 11/30/2018 CLINICAL DATA:  Status post CABG and mitral valve replacement EXAM: PORTABLE CHEST 1 VIEW COMPARISON:  November 29, 2018 FINDINGS: An ETT is in good position. A PA  catheter terminates near the pulmonary outflow tract. Mediastinal and chest tubes are seen. No pneumothorax. No overt pulmonary edema or pulmonary infiltrate. IMPRESSION: 1. Support apparatus as above. 2. Postsurgical changes in the chest. Electronically Signed   By: Dorise Bullion III M.D   On: 11/30/2018 15:48      Medications:     Current Medications: . acetaminophen  1,000 mg Oral Q6H   Or  . acetaminophen (TYLENOL) oral liquid 160 mg/5 mL  1,000 mg Per Tube Q6H  . aspirin EC  325 mg Oral Daily   Or  . aspirin  324 mg Per Tube Daily  . bisacodyl  10 mg Oral Daily   Or  . bisacodyl  10 mg Rectal Daily  . chlorhexidine  15 mL Mouth/Throat NOW  . chlorhexidine gluconate (MEDLINE KIT)  15 mL Mouth Rinse BID  . Chlorhexidine Gluconate Cloth  6 each Topical Daily  . docusate sodium  200 mg Oral Daily  . insulin regular  0-10 Units Intravenous TID WC  . mouth rinse  15 mL Mouth Rinse 10 times per day  . metoprolol tartrate  12.5 mg Oral BID   Or  . metoprolol tartrate  12.5 mg Per Tube BID  . [START ON 12/02/2018] pantoprazole  40 mg Oral Daily  . sodium chloride flush  3 mL Intravenous Q12H     Infusions: . sodium chloride 20 mL/hr at 12/01/18 0700  . sodium chloride    . sodium chloride 10 mL (11/30/18 1500)  . amiodarone Stopped (11/30/18 1700)  . dexmedetomidine (PRECEDEX) IV infusion 0.4 mcg/kg/hr (12/01/18 0700)  . dextrose 5 % Impella 5.0 Purge solution    . epinephrine 3 mcg/min (12/01/18 0700)  . insulin 1.7 mL/hr at 12/01/18 0700  . lactated ringers    . lactated ringers 100 mL/hr at 12/01/18 0700  . levofloxacin (LEVAQUIN) IV    . milrinone 0.25 mcg/kg/min (12/01/18 0700)  . nitroGLYCERIN Stopped (12/01/18 0647)  . norepinephrine (LEVOPHED) Adult infusion Stopped (11/30/18 2233)  . phenylephrine (NEO-SYNEPHRINE) Adult infusion Stopped (11/30/18 2223)  . vasopressin (PITRESSIN) infusion - *FOR SHOCK* Stopped (11/30/18 2331)      Assessment/Plan  1.  Post-cardiotomy cardiogenic shock in setting if severe iCM and post-operative bleeding/hemorrhagic shock - pre-op echo EF 25-30% - s/p CABG & bioprosthetic MVR with Impella palcement on 9/28 c/b severe coagulopathy/bleeding requiring OR takeback yo OR - Hemodynamically improved this am on milrinone 0.25 and epi 3  - Impella at p-6 with good flows. And waveforms - Volume status mildly elevated. Will start gentle diuresis -> watch renal function closely.  - Bleeding seems to have resolved - Check co-ox. Follow swan numbers - Likely need Impella for several more days - start dig and spiro soon once renal function stable  - D/w Dr. Orvan Seen at bedside   2. Acute hypoxemic respiratory failure - Post-op  - on 90% FiO2 compounded by extensive need for blood products and baseline COPD - still with large Aa gradient - cxr this am improving - diurese gently  - watch for ARDS - wean vent slowly  3. Severe MR - s/p bioprosthetic MVR  4. Acute blood loss anemia - transfuse to keep hgb >= 8.0  5. CAD with NSTEMI - s/p CABG 9/28 - ASA/statin - b-blocker and Plavix when ready  CRITICAL CARE Performed by: Glori Bickers  Total critical care time: 55 minutes  Critical care time was exclusive of separately billable procedures and treating other patients.  Critical care was necessary to treat or prevent imminent or life-threatening deterioration.  Critical care was time spent personally by me (independent of midlevel providers or residents) on the following activities: development of treatment plan with patient and/or surrogate as well as nursing, discussions with consultants, evaluation of patient's response to treatment, examination of patient, obtaining history from patient or surrogate, ordering and performing treatments and interventions, ordering and review of laboratory studies, ordering and review of radiographic studies, pulse oximetry and re-evaluation of patient's condition.    Length of Stay: Bledsoe, MD  12/01/2018, 7:05 AM  Advanced Heart Failure Team Pager [phone] (M-F; 7a - 4p)  Please contact [facility] for night-coverage after hours (4p -7a ) and weekends on [email or web address]

## 2018-12-01 NOTE — Progress Notes (Signed)
Rapid wean started at 1525. At 1610 pt placed on CPAP/PS and tolerated well for 15 min then had increased wob/RR (40's). Pt returned to full support settings: SIMV/PRVC/PSV 570/12/50%/+5/PS10. RN aware and will let MD know when arrives to pt bedside.

## 2018-12-01 NOTE — Anesthesia Postprocedure Evaluation (Signed)
Anesthesia Post Note  Patient: Curtis [name]  Procedure(s) Performed: MEDIASTINAL POST-OP BLEED (N/A Chest)     Patient location during evaluation: SICU Anesthesia Type: General Level of consciousness: sedated Pain management: pain level controlled Vital Signs Assessment: post-procedure vital signs reviewed and stable Respiratory status: patient remains intubated per anesthesia plan Cardiovascular status: stable Postop Assessment: no apparent nausea or vomiting Anesthetic complications: no    Last Vitals:  Vitals:   12/01/18 0430 12/01/18 0445  BP:    Pulse: 78 78  Resp: 12 12  Temp: 37 C 36.9 C  SpO2: 100% 100%    Last Pain:  Vitals:   12/01/18 0400  TempSrc: Core (Comment)  PainSc:                  Curtis [name]

## 2018-12-02 ENCOUNTER — Encounter (HOSPITAL_COMMUNITY): Payer: Self-pay | Admitting: Certified Registered"

## 2018-12-02 ENCOUNTER — Inpatient Hospital Stay (HOSPITAL_COMMUNITY): Payer: Medicare Other

## 2018-12-02 LAB — CBC
HCT: 26.2 % — ABNORMAL LOW (ref 39.0–52.0)
HCT: 28 % — ABNORMAL LOW (ref 39.0–52.0)
Hemoglobin: 9.1 g/dL — ABNORMAL LOW (ref 13.0–17.0)
Hemoglobin: 9.9 g/dL — ABNORMAL LOW (ref 13.0–17.0)
MCH: 31.6 pg (ref 26.0–34.0)
MCH: 31.4 pg (ref 26.0–34.0)
MCHC: 34.7 g/dL (ref 30.0–36.0)
MCHC: 35.4 g/dL (ref 30.0–36.0)
MCV: 91 fL (ref 80.0–100.0)
MCV: 88.9 fL (ref 80.0–100.0)
Platelets: 70 10*3/uL — ABNORMAL LOW (ref 150–400)
Platelets: 68 10*3/uL — ABNORMAL LOW (ref 150–400)
RBC: 2.88 MIL/uL — ABNORMAL LOW (ref 4.22–5.81)
RBC: 3.15 MIL/uL — ABNORMAL LOW (ref 4.22–5.81)
RDW: 16.1 % — ABNORMAL HIGH (ref 11.5–15.5)
RDW: 16.7 % — ABNORMAL HIGH (ref 11.5–15.5)
WBC: 13 10*3/uL — ABNORMAL HIGH (ref 4.0–10.5)
WBC: 15.9 10*3/uL — ABNORMAL HIGH (ref 4.0–10.5)
nRBC: 0 % (ref 0.0–0.2)
nRBC: 0 % (ref 0.0–0.2)

## 2018-12-02 LAB — POCT I-STAT 7, (LYTES, BLD GAS, ICA,H+H)
Acid-Base Excess: 2 mmol/L (ref 0.0–2.0)
Bicarbonate: 25.2 mmol/L (ref 20.0–28.0)
Calcium, Ion: 1.12 mmol/L — ABNORMAL LOW (ref 1.15–1.40)
HCT: 24 % — ABNORMAL LOW (ref 39.0–52.0)
Hemoglobin: 8.2 g/dL — ABNORMAL LOW (ref 13.0–17.0)
O2 Saturation: 95 %
Patient temperature: 37.4
Potassium: 3.2 mmol/L — ABNORMAL LOW (ref 3.5–5.1)
Sodium: 141 mmol/L (ref 135–145)
TCO2: 26 mmol/L (ref 22–32)
pCO2 arterial: 35.1 mmHg (ref 32.0–48.0)
pH, Arterial: 7.467 — ABNORMAL HIGH (ref 7.350–7.450)
pO2, Arterial: 72 mmHg — ABNORMAL LOW (ref 83.0–108.0)

## 2018-12-02 LAB — BASIC METABOLIC PANEL
Anion gap: 10 (ref 5–15)
BUN: 18 mg/dL (ref 8–23)
CO2: 24 mmol/L (ref 22–32)
Calcium: 7.7 mg/dL — ABNORMAL LOW (ref 8.9–10.3)
Chloride: 104 mmol/L (ref 98–111)
Creatinine, Ser: 1.38 mg/dL — ABNORMAL HIGH (ref 0.61–1.24)
GFR calc Af Amer: >60 mL/min (ref >60)
GFR calc non Af Amer: 53 mL/min — ABNORMAL LOW (ref >60)
Glucose, Bld: 125 mg/dL — ABNORMAL HIGH (ref 70–99)
Potassium: 3.6 mmol/L (ref 3.5–5.1)
Sodium: 138 mmol/L (ref 135–145)

## 2018-12-02 LAB — COOXEMETRY PANEL
Carboxyhemoglobin: 1.4 % (ref 0.5–1.5)
Methemoglobin: 1.1 % (ref 0.0–1.5)
O2 Saturation: 71.8 %
Total hemoglobin: 7.9 g/dL — ABNORMAL LOW (ref 12.0–16.0)

## 2018-12-02 LAB — GLUCOSE, CAPILLARY
Glucose-Capillary: 100 mg/dL — ABNORMAL HIGH (ref 70–99)
Glucose-Capillary: 100 mg/dL — ABNORMAL HIGH (ref 70–99)
Glucose-Capillary: 106 mg/dL — ABNORMAL HIGH (ref 70–99)
Glucose-Capillary: 106 mg/dL — ABNORMAL HIGH (ref 70–99)
Glucose-Capillary: 106 mg/dL — ABNORMAL HIGH (ref 70–99)
Glucose-Capillary: 107 mg/dL — ABNORMAL HIGH (ref 70–99)
Glucose-Capillary: 107 mg/dL — ABNORMAL HIGH (ref 70–99)
Glucose-Capillary: 108 mg/dL — ABNORMAL HIGH (ref 70–99)
Glucose-Capillary: 109 mg/dL — ABNORMAL HIGH (ref 70–99)
Glucose-Capillary: 110 mg/dL — ABNORMAL HIGH (ref 70–99)
Glucose-Capillary: 111 mg/dL — ABNORMAL HIGH (ref 70–99)
Glucose-Capillary: 113 mg/dL — ABNORMAL HIGH (ref 70–99)
Glucose-Capillary: 113 mg/dL — ABNORMAL HIGH (ref 70–99)
Glucose-Capillary: 113 mg/dL — ABNORMAL HIGH (ref 70–99)
Glucose-Capillary: 116 mg/dL — ABNORMAL HIGH (ref 70–99)
Glucose-Capillary: 119 mg/dL — ABNORMAL HIGH (ref 70–99)
Glucose-Capillary: 119 mg/dL — ABNORMAL HIGH (ref 70–99)
Glucose-Capillary: 120 mg/dL — ABNORMAL HIGH (ref 70–99)
Glucose-Capillary: 122 mg/dL — ABNORMAL HIGH (ref 70–99)
Glucose-Capillary: 124 mg/dL — ABNORMAL HIGH (ref 70–99)
Glucose-Capillary: 127 mg/dL — ABNORMAL HIGH (ref 70–99)
Glucose-Capillary: 99 mg/dL (ref 70–99)

## 2018-12-02 LAB — PREPARE RBC (CROSSMATCH)

## 2018-12-02 LAB — HEPARIN LEVEL (UNFRACTIONATED): Heparin Unfractionated: 0.12 IU/mL — ABNORMAL LOW (ref 0.30–0.70)

## 2018-12-02 LAB — POCT ACTIVATED CLOTTING TIME: Activated Clotting Time: 136 seconds

## 2018-12-02 LAB — LACTATE DEHYDROGENASE: LDH: 317 U/L — ABNORMAL HIGH (ref 98–192)

## 2018-12-02 LAB — APTT: aPTT: 68 seconds — ABNORMAL HIGH (ref 24–36)

## 2018-12-02 LAB — MAGNESIUM: Magnesium: 1.7 mg/dL (ref 1.7–2.4)

## 2018-12-02 MED ORDER — ORAL CARE MOUTH RINSE
15.0000 mL | Freq: Two times a day (BID) | OROMUCOSAL | Status: DC
  Administered 2018-12-02 – 2018-12-06 (×7): 15 mL via OROMUCOSAL

## 2018-12-02 MED ORDER — SODIUM CHLORIDE 0.9% IV SOLUTION
Freq: Once | INTRAVENOUS | Status: AC
  Administered 2018-12-02: 08:00:00 via INTRAVENOUS

## 2018-12-02 MED ORDER — MAGNESIUM SULFATE 2 GM/50ML IV SOLN
2.0000 g | Freq: Once | INTRAVENOUS | Status: AC
  Administered 2018-12-02: 2 g via INTRAVENOUS
  Filled 2018-12-02: qty 50

## 2018-12-02 MED ORDER — EPINEPHRINE PF 1 MG/ML IJ SOLN
0.0000 ug/min | INTRAVENOUS | Status: DC
  Filled 2018-12-02: qty 8

## 2018-12-02 MED ORDER — POTASSIUM CHLORIDE CRYS ER 20 MEQ PO TBCR
20.0000 meq | EXTENDED_RELEASE_TABLET | ORAL | Status: AC
  Administered 2018-12-02 (×2): 20 meq via ORAL
  Filled 2018-12-02 (×2): qty 1

## 2018-12-02 MED ORDER — LEVALBUTEROL HCL 0.63 MG/3ML IN NEBU
0.6300 mg | INHALATION_SOLUTION | Freq: Two times a day (BID) | RESPIRATORY_TRACT | Status: DC
  Administered 2018-12-02 – 2018-12-11 (×16): 0.63 mg via RESPIRATORY_TRACT
  Filled 2018-12-02 (×18): qty 3

## 2018-12-02 MED ORDER — POTASSIUM CHLORIDE 20 MEQ/15ML (10%) PO SOLN
20.0000 meq | ORAL | Status: AC
  Administered 2018-12-02 (×2): 20 meq via ORAL
  Filled 2018-12-02 (×2): qty 15

## 2018-12-02 MED ORDER — LEVALBUTEROL HCL 0.63 MG/3ML IN NEBU
INHALATION_SOLUTION | RESPIRATORY_TRACT | Status: AC
  Administered 2018-12-02: 0.63 mg via RESPIRATORY_TRACT
  Filled 2018-12-02: qty 3

## 2018-12-02 MED ORDER — LEVALBUTEROL HCL 0.63 MG/3ML IN NEBU
0.6300 mg | INHALATION_SOLUTION | Freq: Four times a day (QID) | RESPIRATORY_TRACT | Status: DC
  Administered 2018-12-02 (×2): 0.63 mg via RESPIRATORY_TRACT
  Filled 2018-12-02: qty 3

## 2018-12-02 MED ORDER — HYDRALAZINE HCL 10 MG PO TABS: 10.0000 mg | ORAL_TABLET | Freq: Three times a day (TID) | ORAL | Status: DC

## 2018-12-02 MED ORDER — HYDRALAZINE HCL 10 MG PO TABS: 10.0000 mg | ORAL_TABLET | Freq: Three times a day (TID) | ORAL | Status: DC | PRN

## 2018-12-02 MED ORDER — NOREPINEPHRINE 16 MG/250ML-% IV SOLN: 0.0000 ug/min | INTRAVENOUS | Status: DC

## 2018-12-02 MED ORDER — LEVALBUTEROL TARTRATE 45 MCG/ACT IN AERO: 2.0000 | INHALATION_SPRAY | Freq: Four times a day (QID) | RESPIRATORY_TRACT | Status: DC

## 2018-12-02 MED ORDER — FUROSEMIDE 10 MG/ML IJ SOLN
60.0000 mg | Freq: Two times a day (BID) | INTRAMUSCULAR | Status: DC
  Administered 2018-12-04 – 2018-12-07 (×8): 60 mg via INTRAVENOUS
  Filled 2018-12-02 (×9): qty 6

## 2018-12-02 MED ORDER — INSULIN ASPART 100 UNIT/ML ~~LOC~~ SOLN
0.0000 [IU] | SUBCUTANEOUS | Status: DC
  Administered 2018-12-03: 8 [IU] via SUBCUTANEOUS
  Administered 2018-12-03: 2 [IU] via SUBCUTANEOUS
  Administered 2018-12-03: 8 [IU] via SUBCUTANEOUS
  Administered 2018-12-03: 4 [IU] via SUBCUTANEOUS

## 2018-12-02 MED ORDER — POTASSIUM CHLORIDE 20 MEQ/15ML (10%) PO SOLN
20.0000 meq | ORAL | Status: DC
  Administered 2018-12-02: 07:00:00 20 meq
  Filled 2018-12-02: qty 15

## 2018-12-02 NOTE — Progress Notes (Signed)
      [address]       [place] [address]             [phone]      POD # 2  Extubated, talking with family on telephone  BP 126/70   Pulse 97   Temp 99.5 F (37.5 C)   Resp (!) 32   Ht 5\' 9"  (1.753 m)   Wt 100.1 kg Comment: unsure of accuracy, unable to zero bed w/patient it   SpO2 93%   BMI 32.59 kg/m  impella on P5 CVp =4 PA= 34/15 co-ox 72%   Intake/Output Summary (Last 24 hours) at 12/02/2018 1810 Last data filed at 12/02/2018 1800 Gross per 24 hour  Intake 4106.32 ml  Output 4705 ml  Net -598.68 ml   Continue current Rx  Korinne Greenstein C. Roxan Hockey, MD [facility] [phone]

## 2018-12-02 NOTE — Progress Notes (Addendum)
[facility] DAILY ICU PROGRESS NOTE                   [address]            [place], [address]          [phone]   2 Days Post-Op Procedure(s) (LRB): MEDIASTINAL POST-OP BLEED (N/A)  Total Length of Stay:  LOS: 8 days   Subjective: Alert on vent, following simple commands and MAE Denies significant pain  Objective: Vital signs in last 24 hours: Temp:  [97.9 F (36.6 C)-99.5 F (37.5 C)] 99 F (37.2 C) (09/30 0800) Pulse Rate:  [76-90] 89 (09/30 0800) Cardiac Rhythm: A-V Sequential paced (09/30 0400) Resp:  [11-29] 22 (09/30 0800) BP: (90-170)/(54-83) 140/83 (09/30 0800) SpO2:  [93 %-100 %] 96 % (09/30 0800) Arterial Line BP: (91-224)/(51-81) 155/69 (09/30 0800) FiO2 (%):  [40 %-60 %] 50 % (09/30 0743) Weight:  [95.1 kg] 95.1 kg (09/30 0635)  Filed Weights   12/01/18 0400 12/01/18 1500 12/02/18 0635  Weight: 95.1 kg 95.1 kg 95.1 kg    Weight change: 0 kg   Hemodynamic parameters for last 24 hours: PAP: (18-49)/(9-28) 31/18 CVP:  [2 mmHg-17 mmHg] 8 mmHg PCWP:  [11 mmHg-14 mmHg] 14 mmHg CO:  [4.2 L/min-6.7 L/min] 5.1 L/min CI:  [2.1 L/min/m2-3.4 L/min/m2] 2.6 L/min/m2  Vent Mode: SIMV;PRVC;PSV FiO2 (%):  [40 %-60 %] 50 % Set Rate:  [4 bmp-12 bmp] 4 bmp Vt Set:  [570 mL] 570 mL PEEP:  [5 cmH20] 5 cmH20 Pressure Support:  [10 cmH20-410 cmH20] 10 cmH20 Plateau Pressure:  [19 cmH20-24 cmH20] 24 cmH20  Intake/Output from previous day: 09/29 0701 - 09/30 0700 In: 4570.7 [I.V.:3830; IV Piggyback:307.6] Out: 1194 [Urine:3530; Emesis/NG output:300; Chest Tube:1005]  Intake/Output this shift: Total I/O In: 165.4 [I.V.:146.5; Other:18.9] Out: 310 [Urine:260; Chest Tube:50]  Current Meds: Scheduled Meds: . acetaminophen  1,000 mg Oral Q6H   Or  . acetaminophen (TYLENOL) oral liquid 160 mg/5 mL  1,000 mg Per Tube Q6H  . aspirin EC  325 mg Oral Daily   Or  . aspirin  324 mg Per Tube Daily  . bisacodyl  10 mg Oral Daily   Or  . bisacodyl  10 mg  Rectal Daily  . chlorhexidine gluconate (MEDLINE KIT)  15 mL Mouth Rinse BID  . Chlorhexidine Gluconate Cloth  6 each Topical Daily  . docusate sodium  200 mg Oral Daily  . furosemide  60 mg Intravenous BID  . insulin regular  0-10 Units Intravenous TID WC  . levalbuterol  0.63 mg Nebulization Q6H  . mouth rinse  15 mL Mouth Rinse 10 times per day  . pantoprazole  40 mg Oral Daily  . potassium chloride  20 mEq Per Tube Q4H  . sodium chloride flush  3 mL Intravenous Q12H   Continuous Infusions: . sodium chloride 20 mL/hr at 12/02/18 0800  . sodium chloride    . sodium chloride 10 mL (11/30/18 1500)  . amiodarone 30 mg/hr (12/02/18 0509)  . dexmedetomidine (PRECEDEX) IV infusion 0.2 mcg/kg/hr (12/02/18 0800)  . epinephrine 3 mcg/min (12/02/18 0800)  . impella catheter heparin 50 unit/mL in dextrose 5%    . insulin 2.4 mL/hr at 12/02/18 0800  . lactated ringers    . lactated ringers 100 mL/hr at 12/02/18 0800  . milrinone 0.25 mcg/kg/min (12/02/18 0800)  . nitroGLYCERIN 5 mcg/min (12/02/18 0800)  . norepinephrine (LEVOPHED) Adult infusion 4 mcg/min (12/01/18 2307)  . phenylephrine (NEO-SYNEPHRINE)  Adult infusion Stopped (11/30/18 2223)  . vasopressin (PITRESSIN) infusion - *FOR SHOCK* Stopped (11/30/18 2331)   PRN Meds:.sodium chloride, metoprolol tartrate, midazolam, morphine injection, ondansetron (ZOFRAN) IV, oxyCODONE, sodium chloride flush, traMADol  General appearance: alert, cooperative and no distress Neurologic: intact grossly, follows simple commands, MAEx4 Heart: regular rate and rhythm and AV paced Lungs: pretty clear anteriorly Abdomen: benign Extremities: + edema, extremities Wound: incis dressings CDI  Lab Results: CBC: Recent Labs    12/01/18 1545 12/02/18 0401 12/02/18 0607  WBC 12.1* 13.0*  --   HGB 9.5* 9.1* 8.2*  HCT 27.2* 26.2* 24.0*  PLT 70* 70*  --    BMET:  Recent Labs    12/01/18 0127  12/01/18 1545 12/02/18 0607  NA 139   < > 141 141  K  4.1   < > 3.5 3.2*  CL 108  --  106  --   CO2 23  --  25  --   GLUCOSE 122*  --  102*  --   BUN 24*  --  22  --   CREATININE 1.34*  --  1.36*  --   CALCIUM 8.1*  --  7.5*  --    < > = values in this interval not displayed.    CMET: Lab Results  Component Value Date   WBC 13.0 (H) 12/02/2018   HGB 8.2 (L) 12/02/2018   HCT 24.0 (L) 12/02/2018   PLT 70 (L) 12/02/2018   GLUCOSE 102 (H) 12/01/2018   CHOL 233 (H) 11/25/2018   TRIG 155 (H) 11/25/2018   HDL 31 (L) 11/25/2018   LDLCALC 171 (H) 11/25/2018   ALT 37 11/30/2018   AST 30 11/30/2018   NA 141 12/02/2018   K 3.2 (L) 12/02/2018   CL 106 12/01/2018   CREATININE 1.36 (H) 12/01/2018   BUN 22 12/01/2018   CO2 25 12/01/2018   TSH 0.350 11/24/2018   INR 1.5 (H) 11/30/2018   HGBA1C 5.8 (H) 11/24/2018   ABG    Component Value Date/Time   PHART 7.467 (H) 12/02/2018 0607   PCO2ART 35.1 12/02/2018 0607   PO2ART 72.0 (L) 12/02/2018 0607   HCO3 25.2 12/02/2018 0607   TCO2 26 12/02/2018 0607   ACIDBASEDEF 1.0 12/01/2018 0507   O2SAT 71.8 12/02/2018 0628     PT/INR:  Recent Labs    11/30/18 2228  LABPROT 17.9*  INR 1.5*   Radiology: Dg Chest 1 View  Result Date: 12/02/2018 CLINICAL DATA:  Postoperative status post CABG. EXAM: CHEST  1 VIEW COMPARISON:  12/01/2018 FINDINGS: Endotracheal tube remains in place. Enteric tube coursing off the field of the radiograph. Stable position of the right-sided Swan-Ganz catheter and Impella device since previous exam. Signs of mitral valve replacement as before. Bilateral chest tubes remaining in place without signs of visible pneumothorax. Signs of mild basilar atelectasis persist. Epicardial pacer wires project over the upper abdomen. Cardiomediastinal contours are unchanged with postoperative changes of sternotomy, skin staples in place. IMPRESSION: Unchanged appearance of Impella device, projecting somewhat medially but unchanged in position. Stable appearance of prominence of superior  mediastinum presumably postoperative. Electronically Signed   By: Zetta Bills M.D.   On: 12/02/2018 08:08     Assessment/Plan: S/P Procedure(s) (LRB): MEDIASTINAL POST-OP BLEED (N/A)  1 stable with impella at P5, on milrinone and epi, amiodarone, AV paced, CoOx 71.8, PAP reasonable with good CO/CI, BP well controlled 2 ABG pretty reasonable, xopenex added to assist with oxygenation- attempting to wean as able 3 H/H  8.2/24- will receive one unit PRBC's 4 thrombocytopenia is stable with platelets at 70K, on heparin with impella- cont to monitor 5 minor leukocytosis, reactive, minor, Tmax 99.5 6 good UOP, creat only mildly elev and stable at 1.36 yesterday afternoon 7 BS well controlled 8 CXR , lung fields pretty clear , moderate CT drainage conts- keep CT's in place  Purcell E GoldPA-C 12/02/2018 8:15 AM  Pager [phone]  Pt seen and examined; agree with note. Plan extubation today; continue diuresis. Txfuse one unit PRBC for Hct 24. D/c Impella Fri if he continues to improve.   Kattleya Kuhnert Z. Orvan Seen, [place]

## 2018-12-02 NOTE — Progress Notes (Signed)
Advanced Heart Failure Rounding Note   Subjective:    Awake on vent (FiO2 50%). Impella LD on P-5  On milrinone 0.25 NE 5 and ntg 20.   MAPs in 80s with good arterial pulsativity   Swan numbers CVP 5-6 PA 27/15 (20) Thermo 4.5/2.3 Co-ox 72%  Impella P-5 Flow 2.9 Waveforms ok VAD interrogated personally. Parameters stable.   Objective:   Weight Range:  Vital Signs:   Temp:  [98.8 F (37.1 C)-99.5 F (37.5 C)] 99.5 F (37.5 C) (09/30 1700) Pulse Rate:  [77-99] 92 (09/30 1700) Resp:  [11-37] 29 (09/30 1700) BP: (96-191)/(57-83) 126/70 (09/30 1100) SpO2:  [93 %-100 %] 93 % (09/30 1700) Arterial Line BP: (99-224)/(51-81) 139/55 (09/30 1700) FiO2 (%):  [40 %-50 %] 40 % (09/30 0848) Weight:  [95.1 kg-100.1 kg] 100.1 kg (09/30 1300) Last BM Date: (UTA)  Weight change: Filed Weights   12/01/18 1500 12/02/18 0635 12/02/18 1300  Weight: 95.1 kg 95.1 kg 100.1 kg    Intake/Output:   Intake/Output Summary (Last 24 hours) at 12/02/2018 1747 Last data filed at 12/02/2018 1700 Gross per 24 hour  Intake 4207.72 ml  Output 5010 ml  Net -802.28 ml     Physical Exam: General:  Awake on vent HEENT: normal +ETT Neck: supple. RIJ swan Carotids 2+ bilat; no bruits. No lymphadenopathy or thryomegaly appreciated. Cor: Impella and sternal dressings in tact   Regular +Impella hum Lungs: coarse Abdomen: soft, nontender, + distended. No hepatosplenomegaly. No bruits or masses. Hypoactive bowel sounds. Extremities: no cyanosis, clubbing, rash, 1+ edema Neuro: awake on vent. Following commands  Telemetry: AV paced 90 Personally reviewed  Labs: Basic Metabolic Panel: Recent Labs  Lab 11/30/18 0500  11/30/18 1336  11/30/18 2019 11/30/18 2047  12/01/18 0127 12/01/18 0507 12/01/18 1545 12/02/18 0607 12/02/18 1457  NA 139   < > 139   < >  --  139   < > 139 142 141 141 138  K 3.5   < > 3.9   < >  --  4.4   < > 4.1 4.0 3.5 3.2* 3.6  CL 103   < > 101  --   --  109  --  108   --  106  --  104  CO2 23  --   --   --   --  21*  --  23  --  25  --  24  GLUCOSE 153*   < > 146*  --   --  110*  --  122*  --  102*  --  125*  BUN 31*   < > 33*  --   --  27*  --  24*  --  22  --  18  CREATININE 1.45*   < > 1.30*  --   --  1.35*  --  1.34*  --  1.36*  --  1.38*  CALCIUM 8.9  --   --   --   --  8.6*  --  8.1*  --  7.5*  --  7.7*  MG 2.0  --   --   --  3.2*  --   --  2.6*  --  2.0  --  1.7   < > = values in this interval not displayed.    Liver Function Tests: Recent Labs  Lab 11/26/18 0158 11/29/18 0400 11/30/18 0500  AST _0 ALT 27 30 37  ALKPHOS 71 75 78  BILITOT 2.5*  2.0* 2.3*  PROT 6.9 7.2 6.9  ALBUMIN 3.8 4.0 3.9   No results for input(s): LIPASE, AMYLASE in the last 168 hours. No results for input(s): AMMONIA in the last 168 hours.  CBC: Recent Labs  Lab 11/30/18 2019  11/30/18 2228  12/01/18 0127 12/01/18 0507 12/01/18 1545 12/02/18 0401 12/02/18 0607 12/02/18 1457  WBC 17.0*  --   --   --  10.9*  --  12.1* 13.0*  --  15.9*  HGB 7.3*   < >  --    < > 10.8* 9.5* 9.5* 9.1* 8.2* 9.9*  HCT 20.6*   < >  --    < > 30.3* 28.0* 27.2* 26.2* 24.0* 28.0*  MCV 92.4  --   --   --  89.6  --  89.8 91.0  --  88.9  PLT 117*  --  74*  --  69*  --  70* 70*  --  68*   < > = values in this interval not displayed.    Cardiac Enzymes: No results for input(s): CKTOTAL, CKMB, CKMBINDEX, TROPONINI in the last 168 hours.  BNP: BNP (last 3 results) Recent Labs    11/23/18 2328  BNP 507.9*    ProBNP (last 3 results) No results for input(s): PROBNP in the last 8760 hours.    Other results:  Imaging: Dg Chest 1 View  Result Date: 12/02/2018 CLINICAL DATA:  Postoperative status post CABG. EXAM: CHEST  1 VIEW COMPARISON:  12/01/2018 FINDINGS: Endotracheal tube remains in place. Enteric tube coursing off the field of the radiograph. Stable position of the right-sided Swan-Ganz catheter and Impella device since previous exam. Signs of mitral valve  replacement as before. Bilateral chest tubes remaining in place without signs of visible pneumothorax. Signs of mild basilar atelectasis persist. Epicardial pacer wires project over the upper abdomen. Cardiomediastinal contours are unchanged with postoperative changes of sternotomy, skin staples in place. IMPRESSION: Unchanged appearance of Impella device, projecting somewhat medially but unchanged in position. Stable appearance of prominence of superior mediastinum presumably postoperative. Electronically Signed   By: Zetta Bills M.D.   On: 12/02/2018 08:08   Dg Chest 1 View  Result Date: 12/01/2018 CLINICAL DATA:  Status post open heart surgery. EXAM: CHEST  1 VIEW COMPARISON:  Radiograph yesterday at 15:35 p.m. FINDINGS: Endotracheal tube tip 2.3 cm from the carina. Tip and side port of enteric tube below the diaphragm in the stomach. Right internal jugular Swan-Ganz catheter tip in the region of the main pulmonary outflow tract. Left and right chest tubes and mediastinal drains in place. Impella device in place. Post median sternotomy. Cardiomegaly with prosthetic valve. Similar upper mediastinal widening. Right apical opacity may be layering pleural fluid. No visualized pneumothorax. Increased right infrahilar opacity favors atelectasis. IMPRESSION: 1. Support apparatus as described. 2. Similar upper mediastinal widening, patient had tortuous vasculature on preoperative imaging. 3. Increased right infrahilar opacity favors atelectasis. Probable right pleural effusion. Electronically Signed   By: Keith Rake M.D.   On: 12/01/2018 00:28   Dg Chest Port 1 View  Result Date: 12/01/2018 CLINICAL DATA:  Postop day 1 status post CABG and subsequent evacuation of mediastinal hematoma. EXAM: PORTABLE CHEST 1 VIEW COMPARISON:  12/01/2018 at 12:10 a.m. FINDINGS: Endotracheal tube tip 5.3 cm above the carina. Right internal jugular Swan-Ganz catheter tip: Main pulmonary artery. Nasogastric tube enters the  stomach. Bilateral chest tubes. Mitral valve prosthesis. An Impella assist device is noted with the outlet projecting over the mid cardiac shadow presumably  proximally in the ascending aorta, and the inlet projecting centrally over the lower portion of the cardiac shadow, presumably in a rightward portion of the left ventricle, although somewhat more medial than is occasionally encountered. Continued prominence the upper mediastinum potentially from hematoma or edema. No appreciable pneumothorax. No pneumomediastinum. Thoracic spondylosis noted. No significant pulmonary edema. There is mild atelectasis at both lung bases. IMPRESSION: 1. The Impella inlet is somewhat medial in position, and is presumably in the left ventricle but near the interventricular septum. The outlet projects over the ascending thoracic aorta in the expected position. Remaining tubes and lines appear unremarkable. 2. No pneumothorax or significant pulmonary edema. Mild atelectasis in the lung bases. 3. Continued prominence of the upper mediastinum potentially from edema or hematoma. Obscured AP window. Electronically Signed   By: Van Clines M.D.   On: 12/01/2018 08:20      Medications:     Scheduled Medications: . acetaminophen  1,000 mg Oral Q6H   Or  . acetaminophen (TYLENOL) oral liquid 160 mg/5 mL  1,000 mg Per Tube Q6H  . aspirin EC  325 mg Oral Daily   Or  . aspirin  324 mg Per Tube Daily  . bisacodyl  10 mg Oral Daily   Or  . bisacodyl  10 mg Rectal Daily  . chlorhexidine gluconate (MEDLINE KIT)  15 mL Mouth Rinse BID  . Chlorhexidine Gluconate Cloth  6 each Topical Daily  . docusate sodium  200 mg Oral Daily  . furosemide  60 mg Intravenous BID  . insulin regular  0-10 Units Intravenous TID WC  . levalbuterol  0.63 mg Nebulization Q6H  . mouth rinse  15 mL Mouth Rinse BID  . pantoprazole  40 mg Oral Daily  . potassium chloride  20 mEq Oral Q4H  . sodium chloride flush  3 mL Intravenous Q12H      Infusions: . sodium chloride Stopped (12/02/18 1640)  . sodium chloride    . sodium chloride 10 mL (11/30/18 1500)  . amiodarone 30 mg/hr (12/02/18 1713)  . dexmedetomidine (PRECEDEX) IV infusion Stopped (12/02/18 7494)  . epinephrine    . impella catheter heparin 50 unit/mL in dextrose 5%    . insulin 2.3 mL/hr at 12/02/18 1700  . lactated ringers    . lactated ringers 10 mL/hr at 12/02/18 1700  . milrinone 0.25 mcg/kg/min (12/02/18 1700)  . nitroGLYCERIN 20 mcg/min (12/02/18 1700)  . norepinephrine (LEVOPHED) Adult infusion    . phenylephrine (NEO-SYNEPHRINE) Adult infusion Stopped (11/30/18 2223)  . vasopressin (PITRESSIN) infusion - *FOR SHOCK* Stopped (11/30/18 2331)     PRN Medications:  sodium chloride, hydrALAZINE, metoprolol tartrate, midazolam, morphine injection, ondansetron (ZOFRAN) IV, oxyCODONE, sodium chloride flush, traMADol   Assessment/Plan:   1. Post-cardiotomy cardiogenic shock in setting if severe iCM and post-operative bleeding/hemorrhagic shock - pre-op echo EF 25-30% - s/p CABG & bioprosthetic MVR with Impella palcement on 9/28 c/b severe coagulopathy/bleeding requiring OR takeback  - Hemodynamics look good on milrinone 0.25 and NE 5 - Impella at p-5 with good flows. And waveforms. VAD interrogated personally. Parameters stable. - Volume status improving rapidly. Weight still up 25 pounds from baseline. Diurese as tolerated Keep CVP > 5 - Bleeding seems to have resolved. Hgb 9.1 this am  - Case d/w Dr. Orvan Seen. Excellent pulsatility on Impella even at p-8. Suspect native function recovered. Agree with Impella removal tomorrow. Continue milrinone 0.25. Wean NE as tolerated - maintaining SR on IV amio.    2. Acute hypoxemic  respiratory failure - Post-op  - Improved - on 50% FiO2 - Continue diuresis as above - cxr improving - wean vent  3. Severe MR - s/p bioprosthetic MVR  4. Acute blood loss anemia - transfuse to keep hgb >= 8.0 - hgb 9.1   5. CAD with NSTEMI - s/p CABG 9/28 - on ASA/statin - b-blocker and Plavix when ready  CRITICAL CARE Performed by: Glori Bickers  Total critical care time: 45 minutes  Critical care time was exclusive of separately billable procedures and treating other patients.  Critical care was necessary to treat or prevent imminent or life-threatening deterioration.  Critical care was time spent personally by me (independent of midlevel providers or residents) on the following activities: development of treatment plan with patient and/or surrogate as well as nursing, discussions with consultants, evaluation of patient's response to treatment, examination of patient, obtaining history from patient or surrogate, ordering and performing treatments and interventions, ordering and review of laboratory studies, ordering and review of radiographic studies, pulse oximetry and re-evaluation of patient's condition.    Length of Stay: 8   Glori Bickers MD 12/02/2018, 5:47 PM  Advanced Heart Failure Team Pager [phone] (M-F; [place])  Please contact [facility] for night-coverage after hours (4p -7a ) and weekends on [email or web address]

## 2018-12-02 NOTE — Progress Notes (Addendum)
ANTICOAGULATION CONSULT NOTE - Follow Up Consult  Pharmacy Consult for Heparin Indication: Impella 5.0  Allergies  Allergen Reactions  . Penicillins Rash    Did it involve swelling of the face/tongue/throat, SOB, or low BP? Unknown Did it involve sudden or severe rash/hives, skin peeling, or any reaction on the inside of your mouth or nose? Yes Did you need to seek medical attention at a hospital or doctor's office? Unknown When did it last happen? childhood If all above answers are "NO", may proceed with cephalosporin use.     Patient Measurements: Height: _0  (175.3 cm) Weight: 209 lb 10.5 oz (95.1 kg) IBW/kg (Calculated) : 70.7 Heparin Dosing Weight: 90.4 kg  Vital Signs: Temp: 99 F (37.2 C) (09/30 1030) Temp Source: Core (09/30 1030) BP: 191/73 (09/30 0942) Pulse Rate: 88 (09/30 1030)  Labs: Recent Labs    11/29/18 1754 11/30/18 0500  11/30/18 1530  11/30/18 2019 11/30/18 2047  11/30/18 2228  12/01/18 0127  12/01/18 1545 12/02/18 0401 12/02/18 0607  HGB  --  16.0   < > 8.5*   < > 7.3*  --    < >  --    < > 10.8*   < > 9.5* 9.1* 8.2*  HCT  --  45.0   < > 25.7*   < > 20.6*  --    < >  --    < > 30.3*   < > 27.2* 26.2* 24.0*  PLT  --  246   < > 127*  --  117*  --   --  74*  --  69*  --  70* 70*  --   APTT  --   --    < > 44*  --  51*  --   --  42*  --  37*  --   --  68*  --   LABPROT  --   --   --  20.3*  --  19.4*  --   --  17.9*  --   --   --   --   --   --   INR  --   --   --  1.8*  --  1.7*  --   --  1.5*  --   --   --   --   --   --   HEPARINUNFRC 0.42 0.63  --   --   --   --   --   --   --   --   --   --   --  0.12*  --   CREATININE  --  1.45*   < >  --   --   --  1.35*  --   --   --  1.34*  --  1.36*  --   --    < > = values in this interval not displayed.    Estimated Creatinine Clearance: 61.7 mL/min (A) (by C-G formula based on SCr of 1.36 mg/dL (H)).   Medications:  Scheduled:  . acetaminophen  1,000 mg Oral Q6H   Or  . acetaminophen  (TYLENOL) oral liquid 160 mg/5 mL  1,000 mg Per Tube Q6H  . aspirin EC  325 mg Oral Daily   Or  . aspirin  324 mg Per Tube Daily  . bisacodyl  10 mg Oral Daily   Or  . bisacodyl  10 mg Rectal Daily  . chlorhexidine gluconate (MEDLINE KIT)  15 mL Mouth Rinse BID  .  Chlorhexidine Gluconate Cloth  6 each Topical Daily  . docusate sodium  200 mg Oral Daily  . furosemide  60 mg Intravenous BID  . insulin regular  0-10 Units Intravenous TID WC  . levalbuterol  0.63 mg Nebulization Q6H  . mouth rinse  15 mL Mouth Rinse BID  . pantoprazole  40 mg Oral Daily  . potassium chloride  20 mEq Oral Q4H  . sodium chloride flush  3 mL Intravenous Q12H   Infusions:  . sodium chloride 20 mL/hr at 12/02/18 1000  . sodium chloride    . sodium chloride 10 mL (11/30/18 1500)  . amiodarone 30 mg/hr (12/02/18 0509)  . dexmedetomidine (PRECEDEX) IV infusion Stopped (12/02/18 8657)  . epinephrine    . impella catheter heparin 50 unit/mL in dextrose 5%    . insulin 2.1 mL/hr at 12/02/18 1000  . lactated ringers    . lactated ringers 100 mL/hr at 12/02/18 1000  . milrinone 0.25 mcg/kg/min (12/02/18 1000)  . nitroGLYCERIN 20 mcg/min (12/02/18 1000)  . norepinephrine (LEVOPHED) Adult infusion    . phenylephrine (NEO-SYNEPHRINE) Adult infusion Stopped (11/30/18 2223)  . vasopressin (PITRESSIN) infusion - *FOR SHOCK* Stopped (11/30/18 2331)    Assessment: 44 yoM admitted with NSTEMI now s/p CABG 9/28 with MVR. Pt required Impella 5.0 placement in OR. He was noted to have significant coagulopathy and had to be taken back to OR urgently for mediastinal post-op bleeding.  Due to high risk of bleeding, will ensure that patient does not receive > 1,000 units/hr total. Current purge infusion rate delivering ~ 950 units/hr of heparin.  Heparin level this morning was 0.12 with purge heparin. ACT this afternoon was ~ 136  Goal of Therapy:  ACT 160-180 Monitor platelets by anticoagulation protocol: Yes   Plan:   Dextrose + heparin purge solution only for now.  Will not start IV heparin to titrate to goal ACT. Per Dr. Orvan Seen, leave heparin rate same since  impella likely be pulled tomorrow.  F/U on when necessary to start IV heparin post impella removal for MVR.  Sherren Kerns, PharmD PGY1 Acute Care Pharmacy Resident [phone] 12/02/2018,10:47 AM

## 2018-12-02 NOTE — Procedures (Addendum)
Extubation Procedure Note  Patient Details:   Name: Curtis [name] DOB: 1950-04-23 MRN: [id number]   Airway Documentation:    Vent end date: 12/02/18 Vent end time: 0942   Evaluation  O2 sats: stable throughout Complications: No apparent complications Patient did tolerate procedure well.  Bilateral Breath Sounds: Clear/diminished. Yes   Pt extubated per MD order to 4L [place]. Cuff leak heard prior to procedure. No apparent complications at this time. RN and RT at bedside. HR 90, RR 18, SATs 99%. Pt able to tell us his name after extubation.   Esperanza Sheets T 12/02/2018, 9:43 AM

## 2018-12-03 ENCOUNTER — Inpatient Hospital Stay (HOSPITAL_COMMUNITY): Payer: Medicare Other

## 2018-12-03 ENCOUNTER — Encounter (HOSPITAL_COMMUNITY)
Admission: EM | Disposition: A | Discharge status: Rehabilitation Facility | Payer: Self-pay | Source: Home / Self Care | Attending: Cardiothoracic Surgery

## 2018-12-03 ENCOUNTER — Encounter (HOSPITAL_COMMUNITY): Payer: Self-pay | Admitting: Cardiothoracic Surgery

## 2018-12-03 LAB — POCT I-STAT 7, (LYTES, BLD GAS, ICA,H+H)
Acid-base deficit: 5 mmol/L — ABNORMAL HIGH (ref 0.0–2.0)
Acid-base deficit: 1 mmol/L (ref 0.0–2.0)
Bicarbonate: 18.5 mmol/L — ABNORMAL LOW (ref 20.0–28.0)
Bicarbonate: 22.1 mmol/L (ref 20.0–28.0)
Calcium, Ion: 1.07 mmol/L — ABNORMAL LOW (ref 1.15–1.40)
Calcium, Ion: 1.12 mmol/L — ABNORMAL LOW (ref 1.15–1.40)
HCT: 26 % — ABNORMAL LOW (ref 39.0–52.0)
HCT: 25 % — ABNORMAL LOW (ref 39.0–52.0)
Hemoglobin: 8.8 g/dL — ABNORMAL LOW (ref 13.0–17.0)
Hemoglobin: 8.5 g/dL — ABNORMAL LOW (ref 13.0–17.0)
O2 Saturation: 87 %
O2 Saturation: 93 %
Patient temperature: 37.4
Patient temperature: 37.2
Potassium: 4 mmol/L (ref 3.5–5.1)
Potassium: 3.8 mmol/L (ref 3.5–5.1)
Sodium: 134 mmol/L — ABNORMAL LOW (ref 135–145)
Sodium: 135 mmol/L (ref 135–145)
TCO2: 19 mmol/L — ABNORMAL LOW (ref 22–32)
TCO2: 23 mmol/L (ref 22–32)
pCO2 arterial: 27.2 mmHg — ABNORMAL LOW (ref 32.0–48.0)
pCO2 arterial: 30.7 mmHg — ABNORMAL LOW (ref 32.0–48.0)
pH, Arterial: 7.444 (ref 7.350–7.450)
pH, Arterial: 7.465 — ABNORMAL HIGH (ref 7.350–7.450)
pO2, Arterial: 51 mmHg — ABNORMAL LOW (ref 83.0–108.0)
pO2, Arterial: 62 mmHg — ABNORMAL LOW (ref 83.0–108.0)

## 2018-12-03 LAB — BPAM RBC
Blood Product Expiration Date: 202010022359
Blood Product Expiration Date: 202010132359
Blood Product Expiration Date: 202010232359
Blood Product Expiration Date: 202010242359
Blood Product Expiration Date: 202010242359
Blood Product Expiration Date: 202010262359
Blood Product Expiration Date: 202010262359
Blood Product Expiration Date: 202010262359
Blood Product Expiration Date: 202010262359
Blood Product Expiration Date: 202010262359
Blood Product Expiration Date: 202010262359
Blood Product Expiration Date: 202010282359
Blood Product Expiration Date: 202010292359
Blood Product Expiration Date: 202010292359
Blood Product Expiration Date: 202010302359
Blood Product Expiration Date: 202010302359
ISSUE DATE / TIME: 202009281625
ISSUE DATE / TIME: 202009281625
ISSUE DATE / TIME: 202009281939
ISSUE DATE / TIME: 202009281939
ISSUE DATE / TIME: 202009282030
ISSUE DATE / TIME: 202009282030
ISSUE DATE / TIME: 202009282030
ISSUE DATE / TIME: 202009282030
ISSUE DATE / TIME: 202009290837
ISSUE DATE / TIME: 202009290837
ISSUE DATE / TIME: 202009290837
ISSUE DATE / TIME: 202009291031
ISSUE DATE / TIME: 202009300825
Unit Type and Rh: 5100
Unit Type and Rh: 5100
Unit Type and Rh: 5100
Unit Type and Rh: 5100
Unit Type and Rh: 5100
Unit Type and Rh: 5100
Unit Type and Rh: 5100
Unit Type and Rh: 5100
Unit Type and Rh: 5100
Unit Type and Rh: 5100
Unit Type and Rh: 5100
Unit Type and Rh: 5100
Unit Type and Rh: 5100
Unit Type and Rh: 5100
Unit Type and Rh: 5100
Unit Type and Rh: 5100

## 2018-12-03 LAB — GLUCOSE, CAPILLARY
Glucose-Capillary: 155 mg/dL — ABNORMAL HIGH (ref 70–99)
Glucose-Capillary: 161 mg/dL — ABNORMAL HIGH (ref 70–99)
Glucose-Capillary: 223 mg/dL — ABNORMAL HIGH (ref 70–99)
Glucose-Capillary: 217 mg/dL — ABNORMAL HIGH (ref 70–99)
Glucose-Capillary: 204 mg/dL — ABNORMAL HIGH (ref 70–99)
Glucose-Capillary: 190 mg/dL — ABNORMAL HIGH (ref 70–99)
Glucose-Capillary: 155 mg/dL — ABNORMAL HIGH (ref 70–99)
Glucose-Capillary: 90 mg/dL (ref 70–99)
Glucose-Capillary: 89 mg/dL (ref 70–99)
Glucose-Capillary: 105 mg/dL — ABNORMAL HIGH (ref 70–99)
Glucose-Capillary: 101 mg/dL — ABNORMAL HIGH (ref 70–99)

## 2018-12-03 LAB — TYPE AND SCREEN
ABO/RH(D): O POS
Antibody Screen: NEGATIVE
Unit division: 0
Unit division: 0
Unit division: 0
Unit division: 0
Unit division: 0
Unit division: 0
Unit division: 0
Unit division: 0
Unit division: 0
Unit division: 0
Unit division: 0
Unit division: 0
Unit division: 0
Unit division: 0
Unit division: 0
Unit division: 0

## 2018-12-03 LAB — CBC WITH DIFFERENTIAL/PLATELET
Abs Immature Granulocytes: 0.35 10*3/uL — ABNORMAL HIGH (ref 0.00–0.07)
Basophils Absolute: 0 10*3/uL (ref 0.0–0.1)
Basophils Relative: 0 %
Eosinophils Absolute: 0 10*3/uL (ref 0.0–0.5)
Eosinophils Relative: 0 %
HCT: 24.5 % — ABNORMAL LOW (ref 39.0–52.0)
Hemoglobin: 8.7 g/dL — ABNORMAL LOW (ref 13.0–17.0)
Immature Granulocytes: 2 %
Lymphocytes Relative: 8 %
Lymphs Abs: 1.3 10*3/uL (ref 0.7–4.0)
MCH: 32.1 pg (ref 26.0–34.0)
MCHC: 35.5 g/dL (ref 30.0–36.0)
MCV: 90.4 fL (ref 80.0–100.0)
Monocytes Absolute: 1.3 10*3/uL — ABNORMAL HIGH (ref 0.1–1.0)
Monocytes Relative: 8 %
Neutro Abs: 12.8 10*3/uL — ABNORMAL HIGH (ref 1.7–7.7)
Neutrophils Relative %: 82 %
Platelets: 62 10*3/uL — ABNORMAL LOW (ref 150–400)
RBC: 2.71 MIL/uL — ABNORMAL LOW (ref 4.22–5.81)
RDW: 16.8 % — ABNORMAL HIGH (ref 11.5–15.5)
WBC: 15.7 10*3/uL — ABNORMAL HIGH (ref 4.0–10.5)
nRBC: 0 % (ref 0.0–0.2)

## 2018-12-03 LAB — BASIC METABOLIC PANEL
Anion gap: 7 (ref 5–15)
BUN: 19 mg/dL (ref 8–23)
CO2: 24 mmol/L (ref 22–32)
Calcium: 7.5 mg/dL — ABNORMAL LOW (ref 8.9–10.3)
Chloride: 103 mmol/L (ref 98–111)
Creatinine, Ser: 1.24 mg/dL (ref 0.61–1.24)
GFR calc Af Amer: >60 mL/min (ref >60)
GFR calc non Af Amer: >60 mL/min (ref >60)
Glucose, Bld: 153 mg/dL — ABNORMAL HIGH (ref 70–99)
Potassium: 3.7 mmol/L (ref 3.5–5.1)
Sodium: 134 mmol/L — ABNORMAL LOW (ref 135–145)

## 2018-12-03 LAB — COOXEMETRY PANEL
Carboxyhemoglobin: 1.2 % (ref 0.5–1.5)
Carboxyhemoglobin: 1 % (ref 0.5–1.5)
Carboxyhemoglobin: 1.2 % (ref 0.5–1.5)
Carboxyhemoglobin: 1.2 % (ref 0.5–1.5)
Carboxyhemoglobin: 1.2 % (ref 0.5–1.5)
Methemoglobin: 0.8 % (ref 0.0–1.5)
Methemoglobin: 0.7 % (ref 0.0–1.5)
Methemoglobin: 1.1 % (ref 0.0–1.5)
Methemoglobin: 0.7 % (ref 0.0–1.5)
Methemoglobin: 0.6 % (ref 0.0–1.5)
O2 Saturation: 46.3 %
O2 Saturation: 45.9 %
O2 Saturation: 38.6 %
O2 Saturation: 44.3 %
O2 Saturation: 41.9 %
Total hemoglobin: 7.9 g/dL — ABNORMAL LOW (ref 12.0–16.0)
Total hemoglobin: 9.5 g/dL — ABNORMAL LOW (ref 12.0–16.0)
Total hemoglobin: 8.8 g/dL — ABNORMAL LOW (ref 12.0–16.0)
Total hemoglobin: 7.7 g/dL — ABNORMAL LOW (ref 12.0–16.0)
Total hemoglobin: 8.3 g/dL — ABNORMAL LOW (ref 12.0–16.0)

## 2018-12-03 LAB — POCT ACTIVATED CLOTTING TIME
Activated Clotting Time: 142 seconds
Activated Clotting Time: 136 seconds
Activated Clotting Time: 142 seconds
Activated Clotting Time: 142 seconds
Activated Clotting Time: 136 seconds
Activated Clotting Time: 147 seconds
Activated Clotting Time: 147 seconds
Activated Clotting Time: 153 seconds

## 2018-12-03 LAB — LACTATE DEHYDROGENASE: LDH: 274 U/L — ABNORMAL HIGH (ref 98–192)

## 2018-12-03 LAB — MAGNESIUM: Magnesium: 2 mg/dL (ref 1.7–2.4)

## 2018-12-03 LAB — PHOSPHORUS: Phosphorus: 3.6 mg/dL (ref 2.5–4.6)

## 2018-12-03 LAB — HEPARIN LEVEL (UNFRACTIONATED): Heparin Unfractionated: <0.1 IU/mL — ABNORMAL LOW (ref 0.30–0.70)

## 2018-12-03 LAB — APTT: aPTT: 55 seconds — ABNORMAL HIGH (ref 24–36)

## 2018-12-03 SURGERY — REMOVAL, CARDIAC ASSIST DEVICE, IMPELLA
Anesthesia: General

## 2018-12-03 MED ORDER — HEPARIN (PORCINE) 25000 UT/250ML-% IV SOLN
200.0000 [IU]/h | INTRAVENOUS | Status: DC
  Administered 2018-12-03: 200 [IU]/h via INTRAVENOUS
  Filled 2018-12-03: qty 250

## 2018-12-03 MED ORDER — POTASSIUM CHLORIDE 10 MEQ/50ML IV SOLN
10.0000 meq | INTRAVENOUS | Status: AC
  Administered 2018-12-03 (×3): 10 meq via INTRAVENOUS
  Filled 2018-12-03 (×3): qty 50

## 2018-12-03 MED ORDER — INSULIN REGULAR(HUMAN) IN NACL 100-0.9 UT/100ML-% IV SOLN
INTRAVENOUS | Status: DC
  Administered 2018-12-03: 2.9 [IU]/h via INTRAVENOUS
  Administered 2018-12-04: 1.1 [IU]/h via INTRAVENOUS
  Filled 2018-12-03 (×3): qty 100

## 2018-12-03 MED ORDER — FENTANYL CITRATE (PF) 250 MCG/5ML IJ SOLN
INTRAMUSCULAR | Status: AC
  Filled 2018-12-03: qty 5

## 2018-12-03 MED ORDER — ROCURONIUM BROMIDE 10 MG/ML (PF) SYRINGE
PREFILLED_SYRINGE | INTRAVENOUS | Status: AC
  Filled 2018-12-03: qty 20

## 2018-12-03 MED ORDER — FUROSEMIDE 10 MG/ML IJ SOLN
60.0000 mg | Freq: Once | INTRAMUSCULAR | Status: AC
  Administered 2018-12-03: 60 mg via INTRAVENOUS
  Filled 2018-12-03: qty 6

## 2018-12-03 MED ORDER — GUAIFENESIN ER 600 MG PO TB12
600.0000 mg | ORAL_TABLET | Freq: Two times a day (BID) | ORAL | Status: DC
  Administered 2018-12-03 – 2018-12-11 (×14): 600 mg via ORAL
  Filled 2018-12-03 (×15): qty 1

## 2018-12-03 MED ORDER — FUROSEMIDE 10 MG/ML IJ SOLN
60.0000 mg | Freq: Once | INTRAMUSCULAR | Status: AC
  Administered 2018-12-03: 60 mg via INTRAVENOUS

## 2018-12-03 MED ORDER — PROTAMINE SULFATE 10 MG/ML IV SOLN
INTRAVENOUS | Status: AC
  Filled 2018-12-03: qty 10

## 2018-12-03 MED ORDER — PROPOFOL 10 MG/ML IV BOLUS
INTRAVENOUS | Status: AC
  Filled 2018-12-03: qty 20

## 2018-12-03 MED ORDER — MIDAZOLAM HCL 2 MG/2ML IJ SOLN
INTRAMUSCULAR | Status: AC
  Filled 2018-12-03: qty 2

## 2018-12-03 MED ORDER — INSULIN REGULAR BOLUS VIA INFUSION
0.0000 [IU] | Freq: Three times a day (TID) | INTRAVENOUS | Status: DC
  Filled 2018-12-03: qty 10

## 2018-12-03 MED ORDER — EPINEPHRINE HCL 5 MG/250ML IV SOLN IN NS
2.0000 ug/min | INTRAVENOUS | Status: AC
  Administered 2018-12-03 – 2018-12-05 (×5): 3 ug/min via INTRAVENOUS
  Administered 2018-12-06: 2 ug/min via INTRAVENOUS
  Filled 2018-12-03 (×4): qty 250

## 2018-12-03 MED ORDER — SACUBITRIL-VALSARTAN 24-26 MG PO TABS
1.0000 | ORAL_TABLET | Freq: Two times a day (BID) | ORAL | Status: DC
  Administered 2018-12-03 – 2018-12-04 (×3): 1 via ORAL
  Filled 2018-12-03 (×5): qty 1

## 2018-12-03 MED ORDER — PROTAMINE SULFATE 10 MG/ML IV SOLN
INTRAVENOUS | Status: AC
  Filled 2018-12-03: qty 25

## 2018-12-03 MED ORDER — DEXMEDETOMIDINE HCL IN NACL 200 MCG/50ML IV SOLN
INTRAVENOUS | Status: AC
  Filled 2018-12-03: qty 50

## 2018-12-03 MED ORDER — SPIRONOLACTONE 12.5 MG HALF TABLET
12.5000 mg | ORAL_TABLET | Freq: Every day | ORAL | Status: DC
  Administered 2018-12-03 – 2018-12-07 (×5): 12.5 mg via ORAL
  Filled 2018-12-03 (×5): qty 1

## 2018-12-03 MED ORDER — MILRINONE LACTATE IN DEXTROSE 20-5 MG/100ML-% IV SOLN
0.2500 ug/kg/min | INTRAVENOUS | Status: DC
  Administered 2018-12-03 – 2018-12-07 (×11): 0.375 ug/kg/min via INTRAVENOUS
  Administered 2018-12-07: 0.25 ug/kg/min via INTRAVENOUS
  Filled 2018-12-03 (×11): qty 100

## 2018-12-03 MED FILL — Heparin Sodium (Porcine) Inj 1000 Unit/ML: INTRAMUSCULAR | Qty: 20 | Status: AC

## 2018-12-03 MED FILL — Calcium Chloride Inj 10%: INTRAVENOUS | Qty: 10 | Status: AC

## 2018-12-03 MED FILL — Sodium Bicarbonate IV Soln 8.4%: INTRAVENOUS | Qty: 50 | Status: AC

## 2018-12-03 MED FILL — Sodium Chloride IV Soln 0.9%: INTRAVENOUS | Qty: 2000 | Status: AC

## 2018-12-03 MED FILL — Sodium Chloride IV Soln 0.9%: INTRAVENOUS | Qty: 3000 | Status: AC

## 2018-12-03 MED FILL — Heparin Sodium (Porcine) Inj 1000 Unit/ML: INTRAMUSCULAR | Qty: 30 | Status: AC

## 2018-12-03 MED FILL — Mannitol IV Soln 20%: INTRAVENOUS | Qty: 500 | Status: AC

## 2018-12-03 MED FILL — Albumin, Human Inj 5%: INTRAVENOUS | Qty: 250 | Status: AC

## 2018-12-03 MED FILL — Electrolyte-R (PH 7.4) Solution: INTRAVENOUS | Qty: 5000 | Status: AC

## 2018-12-03 NOTE — Progress Notes (Signed)
eLink Physician-Brief Progress Note Patient Name: Curtis [name] DOB: [birth year] MRN: [id number]   Date of Service  12/03/2018  HPI/Events of Note  Post-op respiratory failure with increased work of breathing and hypoxemia on ABG.  eICU Interventions  Trial of CPAP of 7 cm of water.        [place]wo U Ogan 12/03/2018, 1:51 AM

## 2018-12-03 NOTE — Progress Notes (Signed)
Advanced Heart Failure Rounding Note   Subjective:    Extubated. Denies CP or SOB. Impella LD on P-4  On milrinone 0.25 Off NE. On IV NTG. Co-ox 46%  Creatinine stable. LDH 274  MAPs in 80-90s with good pulsativity  Given 1 dose lasix yesterday with good response. Further doses held due to CVP 2-3. Weight down to 207 (weighed personally in the bed)  CXR with pulmonary edema and LUL loculated effusion   Swan numbers CVP 6-7 PA 35/14  Thermo 4.9/2.4 -> 5.2/2.6 Co-ox 72%  Impella P-4 Flow 2.3 Waveforms ok VAD interrogated personally. Parameters stable.   Objective:   Weight Range:  Vital Signs:   Temp:  [98.8 F (37.1 C)-99.5 F (37.5 C)] 99 F (37.2 C) (10/01 0500) Pulse Rate:  [81-111] 95 (10/01 0500) Resp:  [14-41] 32 (10/01 0500) BP: (104-191)/(60-83) 126/70 (10/01 0135) SpO2:  [91 %-100 %] 93 % (10/01 0500) Arterial Line BP: (100-177)/(51-71) 144/61 (10/01 0500) FiO2 (%):  [40 %-50 %] 40 % (09/30 0848) Weight:  [100.1 kg] 100.1 kg (09/30 1300) Last BM Date: (UTA)  Weight change: Filed Weights   12/01/18 1500 12/02/18 0635 12/02/18 1300  Weight: 95.1 kg 95.1 kg 100.1 kg    Intake/Output:   Intake/Output Summary (Last 24 hours) at 12/03/2018 0645 Last data filed at 12/03/2018 0600 Gross per 24 hour  Intake 2977.68 ml  Output 3605 ml  Net -627.32 ml     Physical Exam: General:  Awake mildly dyspneic HEENT: normal Neck: supple. JVP 6 Carotids 2+ bilat; no bruits. No lymphadenopathy or thryomegaly appreciated. Cor: PMI nondisplaced. Regular rate & rhythm. Impella hum Lungs: diffuse crackles Abdomen: soft, nontender, nondistended. No hepatosplenomegaly. No bruits or masses. Hypoactive bowel sounds. Extremities: no cyanosis, clubbing, rash, 1+ edema Neuro: alert & orientedx3, cranial nerves grossly intact. moves all 4 extremities w/o difficulty. Affect pleasant  Telemetry: NSR 90sPersonally reviewed  Labs: Basic Metabolic Panel: Recent Labs  Lab  11/30/18 2019 11/30/18 2047  12/01/18 0127  12/01/18 1545 12/02/18 0607 12/02/18 1457 12/03/18 0106 12/03/18 0416 12/03/18 0443  NA  --  139   < > 139   < > 141 141 138 134* 134* 135  K  --  4.4   < > 4.1   < > 3.5 3.2* 3.6 4.0 3.7 3.8  CL  --  109  --  108  --  106  --  104  --  103  --   CO2  --  21*  --  23  --  25  --  24  --  24  --   GLUCOSE  --  110*  --  122*  --  102*  --  125*  --  153*  --   BUN  --  27*  --  24*  --  22  --  18  --  19  --   CREATININE  --  1.35*  --  1.34*  --  1.36*  --  1.38*  --  1.24  --   CALCIUM  --  8.6*  --  8.1*  --  7.5*  --  7.7*  --  7.5*  --   MG 3.2*  --   --  2.6*  --  2.0  --  1.7  --  2.0  --   PHOS  --   --   --   --   --   --   --   --   --  3.6  --    < > = values in this interval not displayed.    Liver Function Tests: Recent Labs  Lab 11/29/18 0400 11/30/18 0500  AST 25 30  ALT 30 37  ALKPHOS 75 78  BILITOT 2.0* 2.3*  PROT 7.2 6.9  ALBUMIN 4.0 3.9   No results for input(s): LIPASE, AMYLASE in the last 168 hours. No results for input(s): AMMONIA in the last 168 hours.  CBC: Recent Labs  Lab 12/01/18 0127  12/01/18 1545 12/02/18 0401 12/02/18 0607 12/02/18 1457 12/03/18 0106 12/03/18 0416 12/03/18 0443  WBC 10.9*  --  12.1* 13.0*  --  15.9*  --  15.7*  --   NEUTROABS  --   --   --   --   --   --   --  12.8*  --   HGB 10.8*   < > 9.5* 9.1* 8.2* 9.9* 8.8* 8.7* 8.5*  HCT 30.3*   < > 27.2* 26.2* 24.0* 28.0* 26.0* 24.5* 25.0*  MCV 89.6  --  89.8 91.0  --  88.9  --  90.4  --   PLT 69*  --  70* 70*  --  68*  --  62*  --    < > = values in this interval not displayed.    Cardiac Enzymes: No results for input(s): CKTOTAL, CKMB, CKMBINDEX, TROPONINI in the last 168 hours.  BNP: BNP (last 3 results) Recent Labs    11/23/18 2328  BNP 507.9*    ProBNP (last 3 results) No results for input(s): PROBNP in the last 8760 hours.    Other results:  Imaging: Dg Chest 1 View  Result Date: 12/02/2018 CLINICAL  DATA:  Postoperative status post CABG. EXAM: CHEST  1 VIEW COMPARISON:  12/01/2018 FINDINGS: Endotracheal tube remains in place. Enteric tube coursing off the field of the radiograph. Stable position of the right-sided Swan-Ganz catheter and Impella device since previous exam. Signs of mitral valve replacement as before. Bilateral chest tubes remaining in place without signs of visible pneumothorax. Signs of mild basilar atelectasis persist. Epicardial pacer wires project over the upper abdomen. Cardiomediastinal contours are unchanged with postoperative changes of sternotomy, skin staples in place. IMPRESSION: Unchanged appearance of Impella device, projecting somewhat medially but unchanged in position. Stable appearance of prominence of superior mediastinum presumably postoperative. Electronically Signed   By: Zetta Bills M.D.   On: 12/02/2018 08:08   Dg Chest Port 1 View  Result Date: 12/03/2018 CLINICAL DATA:  Shortness of breath tonight. Recent open heart surgery CABG. EXAM: PORTABLE CHEST 1 VIEW COMPARISON:  Radiograph yesterday. FINDINGS: Removal of endotracheal and enteric tubes. Impella device unchanged in positioning, slightly medially located. Right internal jugular Swan-Ganz catheter tip in the region of the main pulmonary outflow tract. Bilateral chest tubes and mediastinal drain in place. Overall lower lung volumes after extubation. Increasing peripheral opacity in the left upper lateral hemithorax that appears extrapleural. No pneumothorax. Unchanged heart size and mediastinal contours. Slight increasing bibasilar opacities favoring atelectasis. Increasing central lung opacities. IMPRESSION: 1. Lower lung volumes after extubation. 2. Developing peripheral opacity in the left upper lateral hemithorax which appears extrapleural and likely represents partially loculated pleural fluid. Increasing bibasilar opacities/atelectasis. 3. Increasing central lung opacities may represent pulmonary edema.  Electronically Signed   By: Keith Rake M.D.   On: 12/03/2018 01:54     Medications:     Scheduled Medications:  acetaminophen  1,000 mg Oral Q6H   Or   acetaminophen (TYLENOL) oral liquid  160 mg/5 mL  1,000 mg Per Tube Q6H   aspirin EC  325 mg Oral Daily   Or   aspirin  324 mg Per Tube Daily   bisacodyl  10 mg Oral Daily   Or   bisacodyl  10 mg Rectal Daily   chlorhexidine gluconate (MEDLINE KIT)  15 mL Mouth Rinse BID   Chlorhexidine Gluconate Cloth  6 each Topical Daily   docusate sodium  200 mg Oral Daily   furosemide  60 mg Intravenous BID   insulin aspart  0-24 Units Subcutaneous Q4H   insulin regular  0-10 Units Intravenous TID WC   levalbuterol  0.63 mg Nebulization BID   mouth rinse  15 mL Mouth Rinse BID   pantoprazole  40 mg Oral Daily   sodium chloride flush  3 mL Intravenous Q12H    Infusions:  sodium chloride 20 mL/hr at 12/03/18 0500   sodium chloride     sodium chloride 10 mL (11/30/18 1500)   amiodarone 30 mg/hr (12/03/18 0512)   dexmedetomidine (PRECEDEX) IV infusion Stopped (12/02/18 1610)   epinephrine     impella catheter heparin 50 unit/mL in dextrose 5%     insulin Stopped (12/02/18 2031)   lactated ringers     lactated ringers 10 mL/hr at 12/03/18 0500   milrinone 0.25 mcg/kg/min (12/03/18 0511)   nitroGLYCERIN 30 mcg/min (12/03/18 0500)   norepinephrine (LEVOPHED) Adult infusion     phenylephrine (NEO-SYNEPHRINE) Adult infusion Stopped (11/30/18 2223)   vasopressin (PITRESSIN) infusion - *FOR SHOCK* Stopped (11/30/18 2331)    PRN Medications: sodium chloride, hydrALAZINE, metoprolol tartrate, midazolam, morphine injection, ondansetron (ZOFRAN) IV, oxyCODONE, sodium chloride flush, traMADol   Assessment/Plan:   1. Post-cardiotomy cardiogenic shock in setting if severe iCM and post-operative bleeding/hemorrhagic shock - pre-op echo EF 25-30% - s/p CABG & bioprosthetic MVR with Impella palcement on 9/28  c/b severe coagulopathy/bleeding requiring OR takeback  - Remains on milrinone at 0.25 and Impella on P-4 - Thermo numbers ok but co-ox low. Will repeat co-ox stat - Has diuresed some but more pulmonary edema today - Tentatively scheduled for Impella removal in OR today but need to repeat co-ox and have more fluid off first. Will d/w DR. Atkins  - Continue diuresis. Give 60 IV lasix now  - Will start Entresto 24/26 bid and spiro  wean NTG  2. Acute hypoxemic respiratory failure - Extubated 9/30 - Continue diuresis as above - cxr worse today  3. Severe MR - s/p bioprosthetic MVR  4. Acute blood loss anemia - transfuse to keep hgb >= 8.0 - hgb 8.5  5. CAD with NSTEMI - s/p CABG 9/28 - on ASA/statin - b-blocker and Plavix when ready  6. Thrombocytopenia - PLTs 68k -> 62K  - no bleeding   CRITICAL CARE Performed by: Glori Bickers  Total critical care time: 35 minutes  Critical care time was exclusive of separately billable procedures and treating other patients.  Critical care was necessary to treat or prevent imminent or life-threatening deterioration.  Critical care was time spent personally by me (independent of midlevel providers or residents) on the following activities: development of treatment plan with patient and/or surrogate as well as nursing, discussions with consultants, evaluation of patient's response to treatment, examination of patient, obtaining history from patient or surrogate, ordering and performing treatments and interventions, ordering and review of laboratory studies, ordering and review of radiographic studies, pulse oximetry and re-evaluation of patient's condition.    Length of Stay: 9  Glori Bickers MD 12/03/2018, 6:45 AM  Advanced Heart Failure Team Pager [phone] (M-F; [place])  Please contact [facility] for night-coverage after hours (4p -7a ) and weekends on [email or web address]

## 2018-12-03 NOTE — Progress Notes (Signed)
Nutrition Follow-up  DOCUMENTATION CODES:   Not applicable  INTERVENTION:   - Once diet advanced, Ensure Enlive po BID, each supplement provides 350 kcal and 20 grams of protein  NUTRITION DIAGNOSIS:   Inadequate oral intake related to inability to eat as evidenced by NPO status.  Ongoing  GOAL:   Patient will meet greater than or equal to 90% of their needs  Unmet at this time  MONITOR:   Diet advancement, Weight trends, Skin, I & O's, Labs  REASON FOR ASSESSMENT:   Ventilator    ASSESSMENT:   68 year old male who presented to the ED on 9/21 with SOB. PMH of tobacco use. Pt admitted with acute hypoxemic hypercapnic respiratory failure and elevated troponin/NSTEMI. Pt also found to have CHF.  9/22 - cath showing marked LV dysfunction and multivessel CAD 9/24 - TEE 9/28 - s/p CABG x 3, mitral valve replacement, Impella placement; s/p urgent mediastinal exploration for control of bleeding 9/30 - extubated  Discussed pt with RN and during ICU rounds. Impella removal possibly today or tomorrow.  Today's weight consistent with weight on admit.  Spoke with pt at bedside. Pt reports feeling very hungry. RN reports unsure whether pt's diet will be advanced today or if he will remain NPO. This depends on whether he will go to the OR for Impella removal today.  Pt reports that he had a good appetite PTA. Pt states that his weight fluctuated between 175-195 lbs. Pt reports that recently he lost weight but is unable to provide more details. No weight history PTA available in chart.  Once diet advanced, RD will order an oral nutrition supplement to aid pt in meeting kcal and protein needs.  Medications reviewed and include: Dulcolax, Colace, Lasix, SSI q 4 hours, insulin TID, Protonix, spironolactone, amiodarone, milrinone, nitroglycerine, IV KCl 10 mEq x 3 runs  IVF: 1/2NS @ 20 ml/hr, LR @ 10 ml/hr  Labs reviewed: hemoglobin 8.5 CBG's: 100-155 x 24 hours  UOP: 2855 ml x  24 hours CT: 320 ml x 24 hours I/O's: +4.2 L since admit  NUTRITION - FOCUSED PHYSICAL EXAM:    Most Recent Value  Orbital Region  No depletion  Upper Arm Region  No depletion  Thoracic and Lumbar Region  No depletion  Buccal Region  No depletion  Temple Region  No depletion  Clavicle Bone Region  No depletion  Clavicle and Acromion Bone Region  Mild depletion  Scapular Bone Region  Unable to assess  Dorsal Hand  No depletion  Patellar Region  No depletion  Anterior Thigh Region  Mild depletion  Posterior Calf Region  Mild depletion  Edema (RD Assessment)  Mild [generalized]  Hair  Reviewed  Eyes  Reviewed  Mouth  Reviewed  Skin  Reviewed  Nails  Reviewed       Diet Order:   Diet Order    None      EDUCATION NEEDS:   No education needs have been identified at this time  Skin:  Skin Assessment: Skin Integrity Issues: Skin Integrity Issues: Incisions: closed incisions to chest and left leg  Last BM:  no documented BM  Height:   Ht Readings from Last 1 Encounters:  12/01/18 5\' 9"  (1.753 m)    Weight:   Wt Readings from Last 1 Encounters:  12/03/18 94.3 kg    Ideal Body Weight:  72.7 kg  BMI:  Body mass index is 30.7 kg/m.  Estimated Nutritional Needs:   Kcal:  2100-2300  Protein:  110-130  grams  Fluid:  >/= 1.8 L    Earma Reading, MS, RD, LDN Inpatient Clinical Dietitian Pager: [phone] Weekend/After Hours: [phone]

## 2018-12-03 NOTE — Progress Notes (Signed)
ANTICOAGULATION CONSULT NOTE - Follow Up Consult  Pharmacy Consult for Heparin Indication: Impella 5.0  Allergies  Allergen Reactions  . Penicillins Rash and Other (See Comments)    Did it involve swelling of the face/tongue/throat, SOB, or low BP? Unknown Did it involve sudden or severe rash/hives, skin peeling, or any reaction on the inside of your mouth or nose? Yes Did you need to seek medical attention at a hospital or doctor's office? Unknown When did it last happen? childhood If all above answers are "NO", may proceed with cephalosporin use.     Patient Measurements: Height: '5\' 9"'$  (175.3 cm) Weight: 207 lb 14.3 oz (94.3 kg) IBW/kg (Calculated) : 70.7 Heparin Dosing Weight: 90.4 kg  Vital Signs: Temp: 99.3 F (37.4 C) (10/01 1000) BP: 125/67 (10/01 0818) Pulse Rate: 106 (10/01 1000)  Labs: Recent Labs    11/30/18 1530  11/30/18 2019  11/30/18 2228  12/01/18 0127  12/01/18 1545 12/02/18 0401  12/02/18 1457 12/03/18 0106 12/03/18 0416 12/03/18 0443  HGB 8.5*   < > 7.3*   < >  --    < > 10.8*   < > 9.5* 9.1*   < > 9.9* 8.8* 8.7* 8.5*  HCT 25.7*   < > 20.6*   < >  --    < > 30.3*   < > 27.2* 26.2*   < > 28.0* 26.0* 24.5* 25.0*  PLT 127*  --  117*  --  74*  --  69*  --  70* 70*  --  68*  --  62*  --   APTT 44*  --  51*  --  42*  --  37*  --   --  68*  --   --   --  55*  --   LABPROT 20.3*  --  19.4*  --  17.9*  --   --   --   --   --   --   --   --   --   --   INR 1.8*  --  1.7*  --  1.5*  --   --   --   --   --   --   --   --   --   --   HEPARINUNFRC  --   --   --   --   --   --   --   --   --  0.12*  --   --   --  <0.10*  --   CREATININE  --   --   --    < >  --   --  1.34*  --  1.36*  --   --  1.38*  --  1.24  --    < > = values in this interval not displayed.    Estimated Creatinine Clearance: 67.3 mL/min (by C-G formula based on SCr of 1.24 mg/dL).   Medications:  Scheduled:  . acetaminophen  1,000 mg Oral Q6H   Or  . acetaminophen (TYLENOL) oral  liquid 160 mg/5 mL  1,000 mg Per Tube Q6H  . aspirin EC  325 mg Oral Daily   Or  . aspirin  324 mg Per Tube Daily  . bisacodyl  10 mg Oral Daily   Or  . bisacodyl  10 mg Rectal Daily  . chlorhexidine gluconate (MEDLINE KIT)  15 mL Mouth Rinse BID  . Chlorhexidine Gluconate Cloth  6 each Topical Daily  . docusate sodium  200  mg Oral Daily  . furosemide  60 mg Intravenous BID  . insulin aspart  0-24 Units Subcutaneous Q4H  . insulin regular  0-10 Units Intravenous TID WC  . levalbuterol  0.63 mg Nebulization BID  . mouth rinse  15 mL Mouth Rinse BID  . pantoprazole  40 mg Oral Daily  . sacubitril-valsartan  1 tablet Oral BID  . sodium chloride flush  3 mL Intravenous Q12H  . spironolactone  12.5 mg Oral Daily   Infusions:  . sodium chloride Stopped (12/03/18 0937)  . sodium chloride    . sodium chloride 10 mL (11/30/18 1500)  . amiodarone 30 mg/hr (12/03/18 1000)  . dexmedetomidine (PRECEDEX) IV infusion Stopped (12/02/18 1833)  . epinephrine    . impella catheter heparin 50 unit/mL in dextrose 5%    . insulin Stopped (12/02/18 2031)  . lactated ringers    . lactated ringers 10 mL/hr at 12/03/18 1000  . milrinone 0.375 mcg/kg/min (12/03/18 1000)  . nitroGLYCERIN 30 mcg/min (12/03/18 1000)  . norepinephrine (LEVOPHED) Adult infusion    . phenylephrine (NEO-SYNEPHRINE) Adult infusion Stopped (11/30/18 2223)  . potassium chloride 10 mEq (12/03/18 1038)  . vasopressin (PITRESSIN) infusion - *FOR SHOCK* Stopped (11/30/18 2331)    Assessment: 28 yoM admitted with NSTEMI now s/p CABG 9/28 with MVR. Pt required Impella 5.0 placement in OR. He was noted to have significant coagulopathy and had to be taken back to OR urgently for mediastinal post-op bleeding. Impella not to be removed today. Per TCTS, ok with starting systemic heparin in addition to heparin in purge solution and titrating to therapeutic ACT goal. Current purge infusion rate delivering 1050 units/hr of heparin.  Heparin  level this morning was <0.1 with purge heparin. ACT this afternoon was 142. Per RN, patient has only had ~70 cc of bloody output in the chest tube today.   Goal of Therapy:  ACT 160-180 Monitor platelets by anticoagulation protocol: Yes   Plan:  Start systemic heparin per RN protocol to titrate to goal ACT Dextrose + heparin purge solution Monitor ACT, heparin levels daily, and bleeding  Vertis Kelch, PharmD PGY2 Cardiology Pharmacy Resident Phone [phone] 12/03/2018       10:53 AM  Please check [email or web address] for unit-specific pharmacist phone numbers

## 2018-12-03 NOTE — Progress Notes (Signed)
TCTS BRIEF SICU PROGRESS NOTE  3 Days Post-Op  S/P Procedure(s) (LRB): MEDIASTINAL POST-OP BLEED (N/A)   Relatively stable day NSR w/ stable hemodynamics and Impella flow, Epi increased 3 mcg/min earlier today, milrinone 0.375, PA pressures low and cardiac index >2.0, O2 sats 94% UOP excellent  Plan: Continue current plan  Rexene Alberts, MD 12/03/2018 6:56 PM

## 2018-12-04 ENCOUNTER — Inpatient Hospital Stay (HOSPITAL_COMMUNITY): Payer: Medicare Other

## 2018-12-04 ENCOUNTER — Encounter (HOSPITAL_COMMUNITY)
Admission: EM | Disposition: A | Discharge status: Rehabilitation Facility | Payer: Self-pay | Source: Home / Self Care | Attending: Cardiothoracic Surgery

## 2018-12-04 ENCOUNTER — Inpatient Hospital Stay (HOSPITAL_COMMUNITY): Payer: Medicare Other | Admitting: Certified Registered"

## 2018-12-04 DIAGNOSIS — I97631 Postprocedural hematoma of a circulatory system organ or structure following cardiac bypass: Secondary | ICD-10-CM

## 2018-12-04 DIAGNOSIS — I2510 Atherosclerotic heart disease of native coronary artery without angina pectoris: Secondary | ICD-10-CM

## 2018-12-04 HISTORY — PX: REMOVAL OF IMPELLA LEFT VENTRICULAR ASSIST DEVICE: SHX6556

## 2018-12-04 HISTORY — PX: STERNAL INCISION RECLOSURE: SHX2442

## 2018-12-04 HISTORY — PX: TEE WITHOUT CARDIOVERSION: SHX5443

## 2018-12-04 LAB — POCT I-STAT 7, (LYTES, BLD GAS, ICA,H+H)
Acid-Base Excess: 3 mmol/L — ABNORMAL HIGH (ref 0.0–2.0)
Acid-Base Excess: 2 mmol/L (ref 0.0–2.0)
Acid-base deficit: 2 mmol/L (ref 0.0–2.0)
Acid-base deficit: 1 mmol/L (ref 0.0–2.0)
Bicarbonate: 25.8 mmol/L (ref 20.0–28.0)
Bicarbonate: 23.2 mmol/L (ref 20.0–28.0)
Bicarbonate: 24.3 mmol/L (ref 20.0–28.0)
Bicarbonate: 26.2 mmol/L (ref 20.0–28.0)
Calcium, Ion: 1.08 mmol/L — ABNORMAL LOW (ref 1.15–1.40)
Calcium, Ion: 1.18 mmol/L (ref 1.15–1.40)
Calcium, Ion: 1.11 mmol/L — ABNORMAL LOW (ref 1.15–1.40)
Calcium, Ion: 1.08 mmol/L — ABNORMAL LOW (ref 1.15–1.40)
HCT: 33 % — ABNORMAL LOW (ref 39.0–52.0)
HCT: 22 % — ABNORMAL LOW (ref 39.0–52.0)
HCT: 27 % — ABNORMAL LOW (ref 39.0–52.0)
HCT: 27 % — ABNORMAL LOW (ref 39.0–52.0)
Hemoglobin: 11.2 g/dL — ABNORMAL LOW (ref 13.0–17.0)
Hemoglobin: 7.5 g/dL — ABNORMAL LOW (ref 13.0–17.0)
Hemoglobin: 9.2 g/dL — ABNORMAL LOW (ref 13.0–17.0)
Hemoglobin: 9.2 g/dL — ABNORMAL LOW (ref 13.0–17.0)
O2 Saturation: 94 %
O2 Saturation: 90 %
O2 Saturation: 92 %
O2 Saturation: 100 %
Patient temperature: 37
Patient temperature: 36.4
Potassium: 2.9 mmol/L — ABNORMAL LOW (ref 3.5–5.1)
Potassium: 3.5 mmol/L (ref 3.5–5.1)
Potassium: 3.7 mmol/L (ref 3.5–5.1)
Potassium: 3.8 mmol/L (ref 3.5–5.1)
Sodium: 134 mmol/L — ABNORMAL LOW (ref 135–145)
Sodium: 135 mmol/L (ref 135–145)
Sodium: 134 mmol/L — ABNORMAL LOW (ref 135–145)
Sodium: 135 mmol/L (ref 135–145)
TCO2: 27 mmol/L (ref 22–32)
TCO2: 24 mmol/L (ref 22–32)
TCO2: 26 mmol/L (ref 22–32)
TCO2: 27 mmol/L (ref 22–32)
pCO2 arterial: 32.1 mmHg (ref 32.0–48.0)
pCO2 arterial: 39.8 mmHg (ref 32.0–48.0)
pCO2 arterial: 43 mmHg (ref 32.0–48.0)
pCO2 arterial: 35.1 mmHg (ref 32.0–48.0)
pH, Arterial: 7.512 — ABNORMAL HIGH (ref 7.350–7.450)
pH, Arterial: 7.374 (ref 7.350–7.450)
pH, Arterial: 7.361 (ref 7.350–7.450)
pH, Arterial: 7.479 — ABNORMAL HIGH (ref 7.350–7.450)
pO2, Arterial: 64 mmHg — ABNORMAL LOW (ref 83.0–108.0)
pO2, Arterial: 60 mmHg — ABNORMAL LOW (ref 83.0–108.0)
pO2, Arterial: 66 mmHg — ABNORMAL LOW (ref 83.0–108.0)
pO2, Arterial: 259 mmHg — ABNORMAL HIGH (ref 83.0–108.0)

## 2018-12-04 LAB — POCT ACTIVATED CLOTTING TIME
Activated Clotting Time: 153 seconds
Activated Clotting Time: 158 seconds
Activated Clotting Time: 158 seconds
Activated Clotting Time: 164 seconds
Activated Clotting Time: 164 seconds
Activated Clotting Time: 164 seconds
Activated Clotting Time: 164 seconds
Activated Clotting Time: 153 seconds

## 2018-12-04 LAB — BASIC METABOLIC PANEL
Anion gap: 11 (ref 5–15)
Anion gap: 8 (ref 5–15)
BUN: 21 mg/dL (ref 8–23)
BUN: 18 mg/dL (ref 8–23)
CO2: 25 mmol/L (ref 22–32)
CO2: 23 mmol/L (ref 22–32)
Calcium: 7.5 mg/dL — ABNORMAL LOW (ref 8.9–10.3)
Calcium: 7.5 mg/dL — ABNORMAL LOW (ref 8.9–10.3)
Chloride: 98 mmol/L (ref 98–111)
Chloride: 101 mmol/L (ref 98–111)
Creatinine, Ser: 1.27 mg/dL — ABNORMAL HIGH (ref 0.61–1.24)
Creatinine, Ser: 1.11 mg/dL (ref 0.61–1.24)
GFR calc Af Amer: >60 mL/min (ref >60)
GFR calc Af Amer: >60 mL/min (ref >60)
GFR calc non Af Amer: 59 mL/min — ABNORMAL LOW (ref >60)
GFR calc non Af Amer: >60 mL/min (ref >60)
Glucose, Bld: 125 mg/dL — ABNORMAL HIGH (ref 70–99)
Glucose, Bld: 117 mg/dL — ABNORMAL HIGH (ref 70–99)
Potassium: 2.9 mmol/L — ABNORMAL LOW (ref 3.5–5.1)
Potassium: 3.5 mmol/L (ref 3.5–5.1)
Sodium: 134 mmol/L — ABNORMAL LOW (ref 135–145)
Sodium: 132 mmol/L — ABNORMAL LOW (ref 135–145)

## 2018-12-04 LAB — CBC WITH DIFFERENTIAL/PLATELET
Abs Immature Granulocytes: 0 10*3/uL (ref 0.00–0.07)
Basophils Absolute: 0 10*3/uL (ref 0.0–0.1)
Basophils Relative: 0 %
Eosinophils Absolute: 0.5 10*3/uL (ref 0.0–0.5)
Eosinophils Relative: 2 %
HCT: 26.6 % — ABNORMAL LOW (ref 39.0–52.0)
Hemoglobin: 9.3 g/dL — ABNORMAL LOW (ref 13.0–17.0)
Lymphocytes Relative: 15 %
Lymphs Abs: 3.7 10*3/uL (ref 0.7–4.0)
MCH: 31.2 pg (ref 26.0–34.0)
MCHC: 35 g/dL (ref 30.0–36.0)
MCV: 89.3 fL (ref 80.0–100.0)
Monocytes Absolute: 1.5 10*3/uL — ABNORMAL HIGH (ref 0.1–1.0)
Monocytes Relative: 6 %
Neutro Abs: 18.9 10*3/uL — ABNORMAL HIGH (ref 1.7–7.7)
Neutrophils Relative %: 77 %
Platelets: 121 10*3/uL — ABNORMAL LOW (ref 150–400)
RBC: 2.98 MIL/uL — ABNORMAL LOW (ref 4.22–5.81)
RDW: 17.1 % — ABNORMAL HIGH (ref 11.5–15.5)
WBC: 24.5 10*3/uL — ABNORMAL HIGH (ref 4.0–10.5)
nRBC: 0.6 % — ABNORMAL HIGH (ref 0.0–0.2)
nRBC: 2 /100 WBC — ABNORMAL HIGH

## 2018-12-04 LAB — LACTATE DEHYDROGENASE: LDH: 292 U/L — ABNORMAL HIGH (ref 98–192)

## 2018-12-04 LAB — GLUCOSE, CAPILLARY
Glucose-Capillary: 105 mg/dL — ABNORMAL HIGH (ref 70–99)
Glucose-Capillary: 123 mg/dL — ABNORMAL HIGH (ref 70–99)
Glucose-Capillary: 118 mg/dL — ABNORMAL HIGH (ref 70–99)
Glucose-Capillary: 127 mg/dL — ABNORMAL HIGH (ref 70–99)
Glucose-Capillary: 94 mg/dL (ref 70–99)
Glucose-Capillary: 84 mg/dL (ref 70–99)
Glucose-Capillary: 124 mg/dL — ABNORMAL HIGH (ref 70–99)
Glucose-Capillary: 131 mg/dL — ABNORMAL HIGH (ref 70–99)
Glucose-Capillary: 151 mg/dL — ABNORMAL HIGH (ref 70–99)
Glucose-Capillary: 160 mg/dL — ABNORMAL HIGH (ref 70–99)

## 2018-12-04 LAB — CBC
HCT: 24.1 % — ABNORMAL LOW (ref 39.0–52.0)
Hemoglobin: 8.5 g/dL — ABNORMAL LOW (ref 13.0–17.0)
MCH: 32.2 pg (ref 26.0–34.0)
MCHC: 35.3 g/dL (ref 30.0–36.0)
MCV: 91.3 fL (ref 80.0–100.0)
Platelets: 144 10*3/uL — ABNORMAL LOW (ref 150–400)
RBC: 2.64 MIL/uL — ABNORMAL LOW (ref 4.22–5.81)
RDW: 16.5 % — ABNORMAL HIGH (ref 11.5–15.5)
WBC: 28.8 10*3/uL — ABNORMAL HIGH (ref 4.0–10.5)
nRBC: 1.1 % — ABNORMAL HIGH (ref 0.0–0.2)

## 2018-12-04 LAB — POCT I-STAT 4, (NA,K, GLUC, HGB,HCT)
Glucose, Bld: 135 mg/dL — ABNORMAL HIGH (ref 70–99)
Glucose, Bld: 116 mg/dL — ABNORMAL HIGH (ref 70–99)
HCT: 24 % — ABNORMAL LOW (ref 39.0–52.0)
HCT: 26 % — ABNORMAL LOW (ref 39.0–52.0)
Hemoglobin: 8.2 g/dL — ABNORMAL LOW (ref 13.0–17.0)
Hemoglobin: 8.8 g/dL — ABNORMAL LOW (ref 13.0–17.0)
Potassium: 3.7 mmol/L (ref 3.5–5.1)
Potassium: 3.5 mmol/L (ref 3.5–5.1)
Sodium: 133 mmol/L — ABNORMAL LOW (ref 135–145)
Sodium: 134 mmol/L — ABNORMAL LOW (ref 135–145)

## 2018-12-04 LAB — HEPARIN LEVEL (UNFRACTIONATED): Heparin Unfractionated: 0.53 IU/mL (ref 0.30–0.70)

## 2018-12-04 LAB — COOXEMETRY PANEL
Carboxyhemoglobin: 1.1 % (ref 0.5–1.5)
Carboxyhemoglobin: 1.2 % (ref 0.5–1.5)
Methemoglobin: 0.7 % (ref 0.0–1.5)
Methemoglobin: 0.7 % (ref 0.0–1.5)
O2 Saturation: 41 %
O2 Saturation: 43.8 %
Total hemoglobin: 7.4 g/dL — ABNORMAL LOW (ref 12.0–16.0)
Total hemoglobin: 8.8 g/dL — ABNORMAL LOW (ref 12.0–16.0)

## 2018-12-04 LAB — PREPARE RBC (CROSSMATCH)

## 2018-12-04 LAB — ECHO INTRAOPERATIVE TEE
Height: 69 in
Weight: 3252.23 oz

## 2018-12-04 LAB — APTT: aPTT: 137 seconds — ABNORMAL HIGH (ref 24–36)

## 2018-12-04 SURGERY — REMOVAL, CARDIAC ASSIST DEVICE, IMPELLA
Anesthesia: General

## 2018-12-04 MED ORDER — POTASSIUM CHLORIDE 10 MEQ/50ML IV SOLN: 10.0000 meq | INTRAVENOUS | Status: DC

## 2018-12-04 MED ORDER — VANCOMYCIN HCL 10 G IV SOLR
1500.0000 mg | INTRAVENOUS | Status: AC
  Administered 2018-12-04: 14:00:00 1500 mg via INTRAVENOUS
  Filled 2018-12-04: qty 1000

## 2018-12-04 MED ORDER — POTASSIUM CHLORIDE 10 MEQ/50ML IV SOLN
10.0000 meq | INTRAVENOUS | Status: AC
  Administered 2018-12-04 (×3): 10 meq via INTRAVENOUS
  Filled 2018-12-04 (×3): qty 50

## 2018-12-04 MED ORDER — POTASSIUM CHLORIDE 10 MEQ/50ML IV SOLN
10.0000 meq | INTRAVENOUS | Status: AC
  Administered 2018-12-04 (×3): 10 meq via INTRAVENOUS
  Filled 2018-12-04: qty 50

## 2018-12-04 MED ORDER — VANCOMYCIN HCL 1000 MG IV SOLR
INTRAVENOUS | Status: AC
  Administered 2018-12-04: 14:00:00 1000 mL
  Filled 2018-12-04: qty 1000

## 2018-12-04 MED ORDER — ROCURONIUM BROMIDE 10 MG/ML (PF) SYRINGE
PREFILLED_SYRINGE | INTRAVENOUS | Status: AC
  Filled 2018-12-04: qty 10

## 2018-12-04 MED ORDER — SODIUM CHLORIDE (PF) 0.9 % IJ SOLN
OROMUCOSAL | Status: DC | PRN
  Administered 2018-12-04 (×2): 4 mL via TOPICAL

## 2018-12-04 MED ORDER — LACTATED RINGERS IV SOLN
INTRAVENOUS | Status: DC
  Administered 2018-12-04: 20:00:00 via INTRAVENOUS

## 2018-12-04 MED ORDER — SODIUM CHLORIDE 0.9 % IR SOLN
Status: DC | PRN
  Administered 2018-12-04: 1000 mL

## 2018-12-04 MED ORDER — AMIODARONE LOAD VIA INFUSION
150.0000 mg | Freq: Once | INTRAVENOUS | Status: AC
  Administered 2018-12-04: 150 mg via INTRAVENOUS

## 2018-12-04 MED ORDER — POTASSIUM CHLORIDE 10 MEQ/50ML IV SOLN
10.0000 meq | INTRAVENOUS | Status: AC
  Administered 2018-12-04 (×5): 10 meq via INTRAVENOUS
  Filled 2018-12-04 (×5): qty 50

## 2018-12-04 MED ORDER — MIDAZOLAM HCL 2 MG/2ML IJ SOLN
INTRAMUSCULAR | Status: DC | PRN
  Administered 2018-12-04: 2 mg via INTRAVENOUS

## 2018-12-04 MED ORDER — MIDAZOLAM HCL 2 MG/2ML IJ SOLN
INTRAMUSCULAR | Status: AC
  Filled 2018-12-04: qty 2

## 2018-12-04 MED ORDER — FENTANYL CITRATE (PF) 100 MCG/2ML IJ SOLN
INTRAMUSCULAR | Status: DC | PRN
  Administered 2018-12-04 (×2): 50 ug via INTRAVENOUS
  Administered 2018-12-04: 150 ug via INTRAVENOUS

## 2018-12-04 MED ORDER — PROPOFOL 10 MG/ML IV BOLUS
INTRAVENOUS | Status: AC
  Filled 2018-12-04: qty 20

## 2018-12-04 MED ORDER — LACTATED RINGERS IV SOLN
INTRAVENOUS | Status: DC | PRN
  Administered 2018-12-04 (×2): via INTRAVENOUS

## 2018-12-04 MED ORDER — DEXMEDETOMIDINE HCL IN NACL 200 MCG/50ML IV SOLN
INTRAVENOUS | Status: AC
  Filled 2018-12-04: qty 50

## 2018-12-04 MED ORDER — FENTANYL CITRATE (PF) 250 MCG/5ML IJ SOLN
INTRAMUSCULAR | Status: AC
  Filled 2018-12-04: qty 5

## 2018-12-04 MED ORDER — PHENYLEPHRINE 40 MCG/ML (10ML) SYRINGE FOR IV PUSH (FOR BLOOD PRESSURE SUPPORT)
PREFILLED_SYRINGE | INTRAVENOUS | Status: DC | PRN
  Administered 2018-12-04 (×2): 120 ug via INTRAVENOUS

## 2018-12-04 MED ORDER — MAGNESIUM SULFATE 2 GM/50ML IV SOLN
2.0000 g | Freq: Once | INTRAVENOUS | Status: AC
  Administered 2018-12-04: 2 g via INTRAVENOUS
  Filled 2018-12-04: qty 50

## 2018-12-04 MED ORDER — LIDOCAINE 2% (20 MG/ML) 5 ML SYRINGE
INTRAMUSCULAR | Status: AC
  Filled 2018-12-04: qty 5

## 2018-12-04 MED ORDER — VASOPRESSIN 20 UNIT/ML IV SOLN
INTRAVENOUS | Status: DC | PRN
  Administered 2018-12-04: 2 [IU] via INTRAVENOUS

## 2018-12-04 MED ORDER — CALCIUM CHLORIDE 10 % IV SOLN
INTRAVENOUS | Status: DC | PRN
  Administered 2018-12-04 (×3): 200 mg via INTRAVENOUS
  Administered 2018-12-04 (×2): 100 mg via INTRAVENOUS
  Administered 2018-12-04: 200 mg via INTRAVENOUS

## 2018-12-04 MED ORDER — NOREPINEPHRINE 4 MG/250ML-% IV SOLN
0.0000 ug/min | INTRAVENOUS | Status: DC
  Administered 2018-12-04: 7 ug/min via INTRAVENOUS
  Administered 2018-12-05: 8 ug/min via INTRAVENOUS
  Administered 2018-12-05: 6 ug/min via INTRAVENOUS
  Administered 2018-12-05: 8 ug/min via INTRAVENOUS
  Filled 2018-12-04 (×3): qty 250

## 2018-12-04 MED ORDER — ROCURONIUM BROMIDE 10 MG/ML (PF) SYRINGE
PREFILLED_SYRINGE | INTRAVENOUS | Status: DC | PRN
  Administered 2018-12-04: 50 mg via INTRAVENOUS
  Administered 2018-12-04: 40 mg via INTRAVENOUS
  Administered 2018-12-04: 30 mg via INTRAVENOUS
  Administered 2018-12-04 (×2): 20 mg via INTRAVENOUS

## 2018-12-04 MED ORDER — NOREPINEPHRINE BITARTRATE 1 MG/ML IV SOLN
INTRAVENOUS | Status: DC | PRN
  Administered 2018-12-04: 14:00:00 3 ug/min via INTRAVENOUS

## 2018-12-04 MED ORDER — SODIUM CHLORIDE 0.9% IV SOLUTION: Freq: Once | INTRAVENOUS | Status: DC

## 2018-12-04 MED ORDER — SUCCINYLCHOLINE CHLORIDE 200 MG/10ML IV SOSY
PREFILLED_SYRINGE | INTRAVENOUS | Status: DC | PRN
  Administered 2018-12-04: 100 mg via INTRAVENOUS

## 2018-12-04 MED ORDER — 0.9 % SODIUM CHLORIDE (POUR BTL) OPTIME
TOPICAL | Status: DC | PRN
  Administered 2018-12-04: 2000 mL

## 2018-12-04 MED ORDER — ALBUMIN HUMAN 5 % IV SOLN
INTRAVENOUS | Status: DC | PRN
  Administered 2018-12-04: 14:00:00 via INTRAVENOUS

## 2018-12-04 MED ORDER — ETOMIDATE 2 MG/ML IV SOLN
INTRAVENOUS | Status: DC | PRN
  Administered 2018-12-04: 20 mg via INTRAVENOUS

## 2018-12-04 SURGICAL SUPPLY — 114 items
ADAPTER CARDIO PERF ANTE/RETRO (ADAPTER) ×2 IMPLANT
ATTRACTOMAT 16X20 MAGNETIC DRP (DRAPES) ×4 IMPLANT
BAG DECANTER FOR FLEXI CONT (MISCELLANEOUS) ×2 IMPLANT
BASKET HEART  (ORDER IN 25'S) (MISCELLANEOUS)
BASKET HEART (ORDER IN 25'S) (MISCELLANEOUS)
BASKET HEART (ORDER IN 25S) (MISCELLANEOUS) ×2 IMPLANT
BATTERY MAXDRIVER (MISCELLANEOUS) ×2 IMPLANT
BLADE CLIPPER SURG (BLADE) ×2 IMPLANT
BLADE STERNUM SYSTEM 6 (BLADE) ×2 IMPLANT
BLADE SURG 12 STRL SS (BLADE) ×2 IMPLANT
BNDG ELASTIC 4X5.8 VLCR STR LF (GAUZE/BANDAGES/DRESSINGS) ×2 IMPLANT
BNDG ELASTIC 6X5.8 VLCR STR LF (GAUZE/BANDAGES/DRESSINGS) ×2 IMPLANT
BNDG GAUZE ELAST 4 BULKY (GAUZE/BANDAGES/DRESSINGS) ×2 IMPLANT
CANISTER SUCT 3000ML PPV (MISCELLANEOUS) ×4 IMPLANT
CANNULA GUNDRY RCSP 15FR (MISCELLANEOUS) ×2 IMPLANT
CATH CPB KIT VANTRIGT (MISCELLANEOUS) ×2 IMPLANT
CATH ROBINSON RED A/P 18FR (CATHETERS) ×6 IMPLANT
CATH THORACIC 36FR RT ANG (CATHETERS) ×2 IMPLANT
CLIP VESOCCLUDE LG 6/CT (CLIP) ×2 IMPLANT
CLIP VESOCCLUDE MED 24/CT (CLIP) ×2 IMPLANT
CLIP VESOCCLUDE SM WIDE 24/CT (CLIP) ×2 IMPLANT
CONN ST 1/4X3/8  BEN (MISCELLANEOUS) ×6
CONN ST 1/4X3/8 BEN (MISCELLANEOUS) IMPLANT
CONN Y 3/8X3/8X3/8  BEN (MISCELLANEOUS) ×2
CONN Y 3/8X3/8X3/8 BEN (MISCELLANEOUS) IMPLANT
COVER SURGICAL LIGHT HANDLE (MISCELLANEOUS) ×2 IMPLANT
COVER WAND RF STERILE (DRAPES) ×2 IMPLANT
DRAIN CHANNEL 32F RND 10.7 FF (WOUND CARE) ×8 IMPLANT
DRAPE CARDIOVASCULAR INCISE (DRAPES)
DRAPE IMP U-DRAPE 54X76 (DRAPES) ×4 IMPLANT
DRAPE SLUSH/WARMER DISC (DRAPES) ×4 IMPLANT
DRAPE SRG 135X102X78XABS (DRAPES) ×2 IMPLANT
DRSG AQUACEL AG ADV 3.5X14 (GAUZE/BANDAGES/DRESSINGS) ×4 IMPLANT
ELECT BLADE 4.0 EZ CLEAN MEGAD (MISCELLANEOUS)
ELECT BLADE 6.5 EXT (BLADE) ×2 IMPLANT
ELECT CAUTERY BLADE 6.4 (BLADE) ×4 IMPLANT
ELECT REM PT RETURN 9FT ADLT (ELECTROSURGICAL) ×8
ELECTRODE BLDE 4.0 EZ CLN MEGD (MISCELLANEOUS) ×2 IMPLANT
ELECTRODE REM PT RTRN 9FT ADLT (ELECTROSURGICAL) ×4 IMPLANT
GAUZE SPONGE 4X4 12PLY STRL (GAUZE/BANDAGES/DRESSINGS) ×6 IMPLANT
GLOVE BIO SURGEON STRL SZ7.5 (GLOVE) ×10 IMPLANT
GOWN STRL REUS W/ TWL LRG LVL3 (GOWN DISPOSABLE) ×8 IMPLANT
GOWN STRL REUS W/TWL LRG LVL3 (GOWN DISPOSABLE) ×10
HEMOSTAT POWDER SURGIFOAM 1G (HEMOSTASIS) ×10 IMPLANT
HEMOSTAT SURGICEL 2X14 (HEMOSTASIS) ×2 IMPLANT
INSERT FOGARTY XLG (MISCELLANEOUS) IMPLANT
KIT BASIN OR (CUSTOM PROCEDURE TRAY) ×4 IMPLANT
KIT REMOVER STAPLE SKIN (MISCELLANEOUS) ×2 IMPLANT
KIT SUCTION CATH 14FR (SUCTIONS) ×4 IMPLANT
KIT TURNOVER KIT B (KITS) ×4 IMPLANT
KIT VASOVIEW HEMOPRO 2 VH 4000 (KITS) ×2 IMPLANT
LEAD PACING MYOCARDI (MISCELLANEOUS) ×4 IMPLANT
MARKER GRAFT CORONARY BYPASS (MISCELLANEOUS) ×6 IMPLANT
NS IRRIG 1000ML POUR BTL (IV SOLUTION) ×16 IMPLANT
PACK CHEST (CUSTOM PROCEDURE TRAY) ×2 IMPLANT
PACK E OPEN HEART (SUTURE) ×2 IMPLANT
PACK OPEN HEART (CUSTOM PROCEDURE TRAY) ×2 IMPLANT
PAD ARMBOARD 7.5X6 YLW CONV (MISCELLANEOUS) ×8 IMPLANT
PAD ELECT DEFIB RADIOL ZOLL (MISCELLANEOUS) ×4 IMPLANT
PENCIL BUTTON HOLSTER BLD 10FT (ELECTRODE) ×2 IMPLANT
PLATE STERNAL 2.3X208 14H 2-PK (Plate) ×2 IMPLANT
POSITIONER HEAD DONUT 9IN (MISCELLANEOUS) ×2 IMPLANT
PULSAVAC PLUS IRRIG FAN TIP (DISPOSABLE) ×4
PUNCH AORTIC ROTATE 4.0MM (MISCELLANEOUS) IMPLANT
PUNCH AORTIC ROTATE 4.5MM 8IN (MISCELLANEOUS) IMPLANT
PUNCH AORTIC ROTATE 5MM 8IN (MISCELLANEOUS) IMPLANT
PUTTY DBX 5CC (Putty) ×2 IMPLANT
SCREW BONE LOCKING 2.3X9 (Screw) ×4 IMPLANT
SCREW LOCKING TI 2.3X11MM (Screw) ×8 IMPLANT
SCREW LOCKING TI 2.3X13MM (Screw) ×8 IMPLANT
SCREW STERNAL LOCK 2.3MM (Screw) ×4 IMPLANT
SET TUBE SMOKE EVAC HIGH FLOW (TUBING) ×2 IMPLANT
STAPLER VISISTAT 35W (STAPLE) ×2 IMPLANT
SURGIFLO W/THROMBIN 8M KIT (HEMOSTASIS) ×2 IMPLANT
SUT BONE WAX W31G (SUTURE) ×4 IMPLANT
SUT ETHIBOND X763 2 0 SH 1 (SUTURE) ×2 IMPLANT
SUT ETHILON 3 0 FSL (SUTURE) ×4 IMPLANT
SUT MNCRL AB 4-0 PS2 18 (SUTURE) ×6 IMPLANT
SUT PDS AB 1 CTX 36 (SUTURE) ×6 IMPLANT
SUT PROLENE 3 0 SH DA (SUTURE) IMPLANT
SUT PROLENE 3 0 SH1 36 (SUTURE) IMPLANT
SUT PROLENE 4 0 RB 1 (SUTURE) ×4
SUT PROLENE 4 0 SH DA (SUTURE) ×2 IMPLANT
SUT PROLENE 4-0 RB1 .5 CRCL 36 (SUTURE) ×2 IMPLANT
SUT PROLENE 5 0 C 1 36 (SUTURE) IMPLANT
SUT PROLENE 6 0 C 1 30 (SUTURE) IMPLANT
SUT PROLENE 6 0 CC (SUTURE) ×6 IMPLANT
SUT PROLENE 8 0 BV175 6 (SUTURE) IMPLANT
SUT PROLENE BLUE 7 0 (SUTURE) ×2 IMPLANT
SUT SILK  1 MH (SUTURE) ×8
SUT SILK 1 MH (SUTURE) IMPLANT
SUT SILK 2 0 SH CR/8 (SUTURE) IMPLANT
SUT SILK 2 0SH CR/8 30 (SUTURE) ×2 IMPLANT
SUT SILK 3 0 SH CR/8 (SUTURE) IMPLANT
SUT STEEL 6MS V (SUTURE) ×6 IMPLANT
SUT STEEL SZ 6 DBL 3X14 BALL (SUTURE) ×4 IMPLANT
SUT VIC AB 1 CTX 36 (SUTURE)
SUT VIC AB 1 CTX36XBRD ANBCTR (SUTURE) ×4 IMPLANT
SUT VIC AB 2-0 CT1 27 (SUTURE)
SUT VIC AB 2-0 CT1 TAPERPNT 27 (SUTURE) IMPLANT
SUT VIC AB 2-0 CTX 27 (SUTURE) IMPLANT
SUT VIC AB 2-0 CTX 36 (SUTURE) ×4 IMPLANT
SUT VIC AB 3-0 X1 27 (SUTURE) IMPLANT
SYSTEM SAHARA CHEST DRAIN ATS (WOUND CARE) ×4 IMPLANT
TAPE CLOTH SURG 4X10 WHT LF (GAUZE/BANDAGES/DRESSINGS) ×2 IMPLANT
TAPE PAPER 2X10 WHT MICROPORE (GAUZE/BANDAGES/DRESSINGS) ×2 IMPLANT
TIP FAN IRRIG PULSAVAC PLUS (DISPOSABLE) IMPLANT
TOWEL GREEN STERILE (TOWEL DISPOSABLE) ×4 IMPLANT
TOWEL GREEN STERILE FF (TOWEL DISPOSABLE) ×4 IMPLANT
TRAY FOLEY SLVR 16FR TEMP STAT (SET/KITS/TRAYS/PACK) ×2 IMPLANT
TUBE CONNECTING 12'X1/4 (SUCTIONS) ×1
TUBE CONNECTING 12X1/4 (SUCTIONS) ×1 IMPLANT
UNDERPAD 30X30 (UNDERPADS AND DIAPERS) ×2 IMPLANT
WATER STERILE IRR 1000ML POUR (IV SOLUTION) ×8 IMPLANT

## 2018-12-04 NOTE — Progress Notes (Addendum)
TCTS DAILY ICU PROGRESS NOTE                   [address]            [place] [address]          [phone]   4 Days Post-Op Procedure(s) (LRB): MEDIASTINAL POST-OP BLEED (N/A)  Total Length of Stay:  LOS: 10 days   Subjective: Impella at P3, on milrinone and epi with adeq CI, PAP are acceptable, CO-OX low at 43 and CVP 9   Objective: Vital signs in last 24 hours: Temp:  [98.4 F (36.9 C)-99.5 F (37.5 C)] 98.8 F (37.1 C) (10/02 0700) Pulse Rate:  [95-106] 104 (10/02 0700) Cardiac Rhythm: Bundle branch block (10/01 2000) Resp:  [20-52] 26 (10/02 0700) BP: (125)/(67) 125/67 (10/01 0818) SpO2:  [90 %-99 %] 93 % (10/02 0700) Arterial Line BP: (93-149)/(49-77) 108/55 (10/02 0700) Weight:  [92.2 kg] 92.2 kg (10/02 0600)  Filed Weights   12/02/18 1300 12/03/18 0600 12/04/18 0600  Weight: 100.1 kg 94.3 kg 92.2 kg    Weight change: -7.9 kg   Hemodynamic parameters for last 24 hours: PAP: (25-39)/(8-21) 31/20 CVP:  [2 mmHg-10 mmHg] 9 mmHg CO:  [4.2 L/min-6.4 L/min] 5.1 L/min CI:  [2.1 L/min/m2-3.2 L/min/m2] 2.6 L/min/m2  Intake/Output from previous day: 10/01 0701 - 10/02 0700 In: 2472.8 [P.O.:120; I.V.:1634.4; IV Piggyback:218.4] Out: 4905 [Urine:4305; Chest Tube:600]  Intake/Output this shift: No intake/output data recorded.  Current Meds: Scheduled Meds: . acetaminophen  1,000 mg Oral Q6H   Or  . acetaminophen (TYLENOL) oral liquid 160 mg/5 mL  1,000 mg Per Tube Q6H  . aspirin EC  325 mg Oral Daily   Or  . aspirin  324 mg Per Tube Daily  . bisacodyl  10 mg Oral Daily   Or  . bisacodyl  10 mg Rectal Daily  . chlorhexidine gluconate (MEDLINE KIT)  15 mL Mouth Rinse BID  . Chlorhexidine Gluconate Cloth  6 each Topical Daily  . docusate sodium  200 mg Oral Daily  . furosemide  60 mg Intravenous BID  . guaiFENesin  600 mg Oral BID  . insulin regular  0-10 Units Intravenous TID WC  . levalbuterol  0.63 mg Nebulization BID  . mouth rinse   15 mL Mouth Rinse BID  . pantoprazole  40 mg Oral Daily  . sacubitril-valsartan  1 tablet Oral BID  . sodium chloride flush  3 mL Intravenous Q12H  . spironolactone  12.5 mg Oral Daily   Continuous Infusions: . sodium chloride Stopped (12/04/18 3149)  . sodium chloride    . sodium chloride 10 mL (11/30/18 1500)  . amiodarone 30 mg/hr (12/04/18 0700)  . dexmedetomidine (PRECEDEX) IV infusion Stopped (12/02/18 7026)  . epinephrine 3 mcg/min (12/04/18 0700)  . impella catheter heparin 50 unit/mL in dextrose 5%    . heparin 900 Units/hr (12/04/18 0700)  . insulin Stopped (12/04/18 0558)  . lactated ringers    . lactated ringers 10 mL/hr at 12/04/18 0700  . milrinone 0.375 mcg/kg/min (12/04/18 0700)  . nitroGLYCERIN Stopped (12/03/18 2300)  . norepinephrine (LEVOPHED) Adult infusion    . phenylephrine (NEO-SYNEPHRINE) Adult infusion Stopped (11/30/18 2223)  . potassium chloride 10 mEq (12/04/18 0740)  . vasopressin (PITRESSIN) infusion - *FOR SHOCK* Stopped (11/30/18 2331)   PRN Meds:.sodium chloride, hydrALAZINE, metoprolol tartrate, midazolam, morphine injection, ondansetron (ZOFRAN) IV, oxyCODONE, sodium chloride flush, traMADol  General appearance: alert, cooperative and no distress Heart: regular rate  and rhythm and distant heart tones Lungs: dim in lower fields Abdomen: soft, non-tender Extremities: minor edema Wound: dressings intact  Lab Results: CBC: Recent Labs    12/03/18 0416  12/04/18 0433 12/04/18 0436  WBC 15.7*  --  28.8*  --   HGB 8.7*   < > 8.5* 11.2*  HCT 24.5*   < > 24.1* 33.0*  PLT 62*  --  144*  --    < > = values in this interval not displayed.   BMET:  Recent Labs    12/03/18 0416  12/04/18 0428 12/04/18 0436  NA 134*   < > 134* 134*  K 3.7   < > 2.9* 2.9*  CL 103  --  98  --   CO2 24  --  25  --   GLUCOSE 153*  --  125*  --   BUN 19  --  21  --   CREATININE 1.24  --  1.27*  --   CALCIUM 7.5*  --  7.5*  --    < > = values in this  interval not displayed.    CMET: Lab Results  Component Value Date   WBC 28.8 (H) 12/04/2018   HGB 11.2 (L) 12/04/2018   HCT 33.0 (L) 12/04/2018   PLT 144 (L) 12/04/2018   GLUCOSE 125 (H) 12/04/2018   CHOL 233 (H) 11/25/2018   TRIG 155 (H) 11/25/2018   HDL 31 (L) 11/25/2018   LDLCALC 171 (H) 11/25/2018   ALT 37 11/30/2018   AST 30 11/30/2018   NA 134 (L) 12/04/2018   K 2.9 (L) 12/04/2018   CL 98 12/04/2018   CREATININE 1.27 (H) 12/04/2018   BUN 21 12/04/2018   CO2 25 12/04/2018   TSH 0.350 11/24/2018   INR 1.5 (H) 11/30/2018   HGBA1C 5.8 (H) 11/24/2018      PT/INR: No results for input(s): LABPROT, INR in the last 72 hours. Radiology: No results found.   Assessment/Plan: S/P Procedure(s) (LRB): MEDIASTINAL POST-OP BLEED (N/A)  1 hemodyn stable, conts current gtts, poss d/c Impella today. AHF team assisting with managemment. Excellent diuresis, will need K+ replacement. BUN/Creat very stable 2 sats ok on 4 liters, ABG shows respiratory alkalosis 3 CT's mod drainage, continue 4 leukocytosis has increased, tmax 99.5, may need to consider abx for pulm coverage- will repeat CXR 5 thrombocytopenia is improved 6 BS controlled John Giovanni PA-C 12/04/2018 7:46 AM  Pager [phone]  Pt seen and examined;stable night; anticipate impella removal later today. Will also need evacuation of left hemothorax. D/c heparin now.  Cheray Pardi Z. Orvan Seen, [place]

## 2018-12-04 NOTE — Progress Notes (Signed)
Advanced Heart Failure Rounding Note   Subjective:    On Impella at P-3. Epi at 3. Milrinone 0.25 Excellent diuresis overnight. Weight down to 203 (pre-op 184).   Co-ox remains low (44%) but swan numbers look good RA 6-7 PA 36/11 Thermo 5.1/2.6  Denies CP or SOB. Had brief run of AF today. Back in NSR on amio   WBC up to 28.8k. K 2.9  Impella P-3 Flow 2.3 Waveforms ok VAD interrogated personally. Parameters stable.   Objective:   Weight Range:  Vital Signs:   Temp:  [98.4 F (36.9 C)-99.5 F (37.5 C)] 99 F (37.2 C) (10/02 1000) Pulse Rate:  [95-105] 104 (10/02 1000) Resp:  [20-52] 34 (10/02 1000) SpO2:  [90 %-99 %] 96 % (10/02 1000) Arterial Line BP: (93-134)/(49-63) 107/52 (10/02 1000) Weight:  [92.2 kg] 92.2 kg (10/02 0600) Last BM Date: 12/03/18  Weight change: Filed Weights   12/02/18 1300 12/03/18 0600 12/04/18 0600  Weight: 100.1 kg 94.3 kg 92.2 kg    Intake/Output:   Intake/Output Summary (Last 24 hours) at 12/04/2018 1054 Last data filed at 12/04/2018 1000 Gross per 24 hour  Intake 2529.49 ml  Output 4350 ml  Net -1820.51 ml     Physical Exam: General:  Sitting up in bed. Mildly dyspneic HEENT: normal Neck: supple. no JVD. Carotids 2+ bilat; no bruits. No lymphadenopathy or thryomegaly appreciated. Cor: Left chest impella  Sternal wound ok PMI nondisplaced. Regular rate & rhythm. No rubs, gallops or murmurs. Lungs: coarse dull on left Abdomen: soft, nontender, nondistended. No hepatosplenomegaly. No bruits or masses. Hypoactivebowel sounds. Extremities: no cyanosis, clubbing, rash, edema Neuro: alert & orientedx3, cranial nerves grossly intact. moves all 4 extremities w/o difficulty. Affect pleasant   Telemetry: NSR 90-105 brief run AFPersonally reviewed  Labs: Basic Metabolic Panel: Recent Labs  Lab 11/30/18 2019  12/01/18 0127  12/01/18 1545  12/02/18 1457 12/03/18 0106 12/03/18 0416 12/03/18 0443 12/04/18 0428 12/04/18 0436   NA  --    < > 139   < > 141   < > 138 134* 134* 135 134* 134*  K  --    < > 4.1   < > 3.5   < > 3.6 4.0 3.7 3.8 2.9* 2.9*  CL  --    < > 108  --  106  --  104  --  103  --  98  --   CO2  --    < > 23  --  25  --  24  --  24  --  25  --   GLUCOSE  --    < > 122*  --  102*  --  125*  --  153*  --  125*  --   BUN  --    < > 24*  --  22  --  18  --  19  --  21  --   CREATININE  --    < > 1.34*  --  1.36*  --  1.38*  --  1.24  --  1.27*  --   CALCIUM  --    < > 8.1*  --  7.5*  --  7.7*  --  7.5*  --  7.5*  --   MG 3.2*  --  2.6*  --  2.0  --  1.7  --  2.0  --   --   --   PHOS  --   --   --   --   --   --   --   --  3.6  --   --   --    < > = values in this interval not displayed.    Liver Function Tests: Recent Labs  Lab 11/29/18 0400 11/30/18 0500  AST 25 30  ALT 30 37  ALKPHOS 75 78  BILITOT 2.0* 2.3*  PROT 7.2 6.9  ALBUMIN 4.0 3.9   No results for input(s): LIPASE, AMYLASE in the last 168 hours. No results for input(s): AMMONIA in the last 168 hours.  CBC: Recent Labs  Lab 12/01/18 1545 12/02/18 0401  12/02/18 1457 12/03/18 0106 12/03/18 0416 12/03/18 0443 12/04/18 0433 12/04/18 0436  WBC 12.1* 13.0*  --  15.9*  --  15.7*  --  28.8*  --   NEUTROABS  --   --   --   --   --  12.8*  --   --   --   HGB 9.5* 9.1*   < > 9.9* 8.8* 8.7* 8.5* 8.5* 11.2*  HCT 27.2* 26.2*   < > 28.0* 26.0* 24.5* 25.0* 24.1* 33.0*  MCV 89.8 91.0  --  88.9  --  90.4  --  91.3  --   PLT 70* 70*  --  68*  --  62*  --  144*  --    < > = values in this interval not displayed.    Cardiac Enzymes: No results for input(s): CKTOTAL, CKMB, CKMBINDEX, TROPONINI in the last 168 hours.  BNP: BNP (last 3 results) Recent Labs    11/23/18 2328  BNP 507.9*    ProBNP (last 3 results) No results for input(s): PROBNP in the last 8760 hours.    Other results:  Imaging: Dg Chest Port 1 View  Result Date: 12/04/2018 CLINICAL DATA:  Status post coronary bypass grafting EXAM: PORTABLE CHEST 1 VIEW  COMPARISON:  12/03/2018 FINDINGS: Cardiac shadow is enlarged. Postsurgical changes are again seen. Impella catheter is noted and stable in appearance. Swan-Ganz catheter is noted in the pulmonary outflow tract. Mediastinal drain and bilateral thoracostomy catheters are seen. No pneumothorax is noted although there is increased density in the left apex laterally when compared with the prior exam. This is consistent with a loculated fluid collection and is significantly larger than that seen on the prior exam now measuring approximately 8.8 by 5.4 cm in greatest dimension. Increase in left-sided pleural effusion is noted as well. No pneumothorax is noted. IMPRESSION: Increasing ovoid opacification in the lateral aspect of left upper hemithorax consistent with a loculated fluid collection. The possibility of focal hematoma deserves consideration. Slight increase in overall left pleural effusion inferiorly. Tubes and lines as described above. These results will be called to the ordering clinician or representative by the Radiologist Assistant, and communication documented in the PACS or zVision Dashboard. Electronically Signed   By: Inez Catalina M.D.   On: 12/04/2018 08:42   Dg Chest Port 1 View  Result Date: 12/03/2018 CLINICAL DATA:  Shortness of breath tonight. Recent open heart surgery CABG. EXAM: PORTABLE CHEST 1 VIEW COMPARISON:  Radiograph yesterday. FINDINGS: Removal of endotracheal and enteric tubes. Impella device unchanged in positioning, slightly medially located. Right internal jugular Swan-Ganz catheter tip in the region of the main pulmonary outflow tract. Bilateral chest tubes and mediastinal drain in place. Overall lower lung volumes after extubation. Increasing peripheral opacity in the left upper lateral hemithorax that appears extrapleural. No pneumothorax. Unchanged heart size and mediastinal contours. Slight increasing bibasilar opacities favoring atelectasis. Increasing central lung opacities.  IMPRESSION: 1. Lower lung volumes after extubation.  2. Developing peripheral opacity in the left upper lateral hemithorax which appears extrapleural and likely represents partially loculated pleural fluid. Increasing bibasilar opacities/atelectasis. 3. Increasing central lung opacities may represent pulmonary edema. Electronically Signed   By: Keith Rake M.D.   On: 12/03/2018 01:54     Medications:     Scheduled Medications: . acetaminophen  1,000 mg Oral Q6H   Or  . acetaminophen (TYLENOL) oral liquid 160 mg/5 mL  1,000 mg Per Tube Q6H  . aspirin EC  325 mg Oral Daily   Or  . aspirin  324 mg Per Tube Daily  . bisacodyl  10 mg Oral Daily   Or  . bisacodyl  10 mg Rectal Daily  . chlorhexidine gluconate (MEDLINE KIT)  15 mL Mouth Rinse BID  . Chlorhexidine Gluconate Cloth  6 each Topical Daily  . docusate sodium  200 mg Oral Daily  . furosemide  60 mg Intravenous BID  . guaiFENesin  600 mg Oral BID  . insulin regular  0-10 Units Intravenous TID WC  . levalbuterol  0.63 mg Nebulization BID  . mouth rinse  15 mL Mouth Rinse BID  . pantoprazole  40 mg Oral Daily  . sacubitril-valsartan  1 tablet Oral BID  . sodium chloride flush  3 mL Intravenous Q12H  . spironolactone  12.5 mg Oral Daily    Infusions: . sodium chloride Stopped (12/04/18 0957)  . sodium chloride    . sodium chloride 10 mL (11/30/18 1500)  . amiodarone 30 mg/hr (12/04/18 1000)  . dexmedetomidine (PRECEDEX) IV infusion Stopped (12/02/18 6389)  . epinephrine 3 mcg/min (12/04/18 1000)  . impella catheter heparin 50 unit/mL in dextrose 5%    . insulin Stopped (12/04/18 0558)  . lactated ringers    . lactated ringers 10 mL/hr at 12/04/18 1000  . milrinone 0.375 mcg/kg/min (12/04/18 1000)  . nitroGLYCERIN Stopped (12/03/18 2300)  . norepinephrine (LEVOPHED) Adult infusion    . phenylephrine (NEO-SYNEPHRINE) Adult infusion Stopped (11/30/18 2223)  . potassium chloride 10 mEq (12/04/18 0957)  . vasopressin  (PITRESSIN) infusion - *FOR SHOCK* Stopped (11/30/18 2331)    PRN Medications: sodium chloride, hydrALAZINE, metoprolol tartrate, midazolam, morphine injection, ondansetron (ZOFRAN) IV, oxyCODONE, sodium chloride flush, traMADol   Assessment/Plan:   1. Post-cardiotomy cardiogenic shock in setting if severe iCM and post-operative bleeding/hemorrhagic shock - pre-op echo EF 25-30% - s/p CABG & bioprosthetic MVR with Impella palcement on 9/28 c/b severe coagulopathy/bleeding requiring OR takeback  - Remains on epi at 3 and Impella P-3. Swan numbers look good but co-ox persistently low with no change with epi or increasing impella. I think swan numbers more accurate.  - D/w Dr. Orvan Seen. Agree that he should go to OR today for Impella removal and evacuation or left hemothorax - Continue diuresis when returns from OR - Continue Entresto 24/26 bid and spiro for afterload reduction as BPs have been high  2. Acute hypoxemic respiratory failure - Extubated 9/30 - Volume status improving but still elevated  - edema improving on CXR but enlarging left hemothorax - to OR today  3. Severe MR - s/p bioprosthetic MVR. Stable   4. Acute blood loss anemia - transfuse to keep hgb >= 8.0 - hgb 8.2  5. CAD with NSTEMI - s/p CABG 9/28 - on ASA/statin - b-blocker and Plavix when ready  6. Thrombocytopenia - PLTs 68k -> 62K -> 144k - no bleeding   7. PAF - back in NSR with amio  CRITICAL CARE Performed by: Tigran Haynie,  Yael Coppess  Total critical care time: 40 minutes  Critical care time was exclusive of separately billable procedures and treating other patients.  Critical care was necessary to treat or prevent imminent or life-threatening deterioration.  Critical care was time spent personally by me (independent of midlevel providers or residents) on the following activities: development of treatment plan with patient and/or surrogate as well as nursing, discussions with consultants,  evaluation of patient's response to treatment, examination of patient, obtaining history from patient or surrogate, ordering and performing treatments and interventions, ordering and review of laboratory studies, ordering and review of radiographic studies, pulse oximetry and re-evaluation of patient's condition.   Length of Stay: 10   Glori Bickers MD 12/04/2018, 10:54 AM  Advanced Heart Failure Team Pager [phone] (M-F; [place])  Please contact [facility] for night-coverage after hours (4p -7a ) and weekends on [email or web address]

## 2018-12-04 NOTE — Progress Notes (Signed)
Attempted rapid wean but pt's RR was 27-31. Pt placed back previous settings.

## 2018-12-04 NOTE — Anesthesia Preprocedure Evaluation (Addendum)
Anesthesia Evaluation  Patient identified by MRN, date of birth, ID band Patient awake    Reviewed: Allergy & Precautions, NPO status , Patient's Chart, lab work & pertinent test results  Airway Mallampati: II  TM Distance: >3 FB Neck ROM: Full    Dental  (+) Dental Advisory Given, Chipped   Pulmonary Current Smoker and Patient abstained from smoking.,    breath sounds clear to auscultation + decreased breath sounds      Cardiovascular hypertension, + CAD, + Past MI, + CABG and +CHF  Normal cardiovascular exam+ Valvular Problems/Murmurs (s/p MVR)  Rhythm:Regular Rate:Normal  On Impella at P-3. Epi at 3. Milrinone 0.25   Echo 11/30/2018: Post-Op Left Ventricle: LV cavity size is normal. Impella device in place, 3 cm from aortic valve. Left ventricular systolic function is decreased. LV wall motion is hypokinetic. Post-Op Right Ventricle: Right ventricle is normal. Systolic function is normal. Post-Op Aortic Valve: Aortic valve unchanged from pre-bypass, prior to Impella placement across valve. Post-Op Mitral Valve: Valve is well seated. There is no evidence of a paravalvular leak.     Neuro/Psych negative neurological ROS  negative psych ROS   GI/Hepatic negative GI ROS, Neg liver ROS,   Endo/Other  Obesity   Renal/GU negative Renal ROS     Musculoskeletal negative musculoskeletal ROS (+)   Abdominal   Peds  Hematology  (+) Blood dyscrasia (Thrombocytopenia), anemia ,   Anesthesia Other Findings Day of surgery medications reviewed with the patient.  Reproductive/Obstetrics                           Anesthesia Physical Anesthesia Plan  ASA: IV  Anesthesia Plan: General   Post-op Pain Management:    Induction: Intravenous  PONV Risk Score and Plan: 1  Airway Management Planned: Oral ETT  Additional Equipment:   Intra-op Plan:   Post-operative Plan: Possible Post-op  intubation/ventilation  Informed Consent: I have reviewed the patients History and Physical, chart, labs and discussed the procedure including the risks, benefits and alternatives for the proposed anesthesia with the patient or authorized representative who has indicated his/her understanding and acceptance.     Dental advisory given  Plan Discussed with: CRNA  Anesthesia Plan Comments:        Anesthesia Quick Evaluation

## 2018-12-04 NOTE — Op Note (Signed)
Procedure(s): REMOVAL OF IMPELLA LEFT VENTRICULAR ASSIST DEVICE.  EVACUATION OF LEFT HEMOTHORAX TRANSESOPHAGEAL ECHOCARDIOGRAM (TEE) Sternal Plating with Sternal Rewiring Procedure Note  Curtis [name] male 68 y.o. 12/04/2018  Procedure(s) and Anesthesia Type:    * REMOVAL OF IMPELLA LEFT VENTRICULAR ASSIST DEVICE.  EVACUATION OF LEFT HEMOTHORAX - General    * TRANSESOPHAGEAL ECHOCARDIOGRAM (TEE) - General    * Sternal Plating with Sternal Rewiring  Surgeon(s) and Role:    * Linden Dolin, MD - Primary   Indications: The patient is s/p CABG and Mitral valve replacement earlier this week and was supported with an Impella LVAD. This is now ready to be removed; he also has evidence of large left hemothorax. The patient now presents for sternal reexploration, removal of impella, and evacuation of left hemothorax after discussing therapeutic alternatives.        Surgeon: Linden Dolin   Assistants: staff  Anesthesia: General endotracheal anesthesia  ASA Class: 4    Procedure Detail  REMOVAL OF IMPELLA LEFT VENTRICULAR ASSIST DEVICE.  EVACUATION OF LEFT HEMOTHORAX, TRANSESOPHAGEAL ECHOCARDIOGRAM (TEE), Sternal Plating with Sternal Rewiring  After informed consent, the patient was taken to the operating room at [facility] on 12/04/2018.  He was placed in the supine position on the operating table.  Anesthesia was begun in a smooth fashion by the general endotracheal technique.  Upon giving anesthesia however the patient's blood pressure dropped.  Transesophageal echocardiography demonstrated moderate pericardial effusion.  The patient's hemodynamics did respond to supportive medication.  The anterior chest was prepped draped sterile field using Betadine solution.  A preop surgical pause was performed.  The prior sternal incision was reopened and sternal hardware was removed.  The bone was relatively intact but there was one fracture on the right side.  Mediastinal  exploration demonstrated a moderate degree of clot anterior which might have been pressing on the right ventricle in addition to body effusion circumferentially.  There was also a relatively sizable right pleural effusion and a large left pleural effusion.  These were completely evacuated and the mediastinum was copiously irrigated.  At the same time transesophageal echocardiography was performed which demonstrated good LV function.  The Impella device was removed and the dacryon graft at the level of the distal ascending aorta was controlled.  Chest tubes were returned to the mediastinum and bilateral pleural spaces rigid sternal reconstruction was then performed with a linear plating system on each hemisternum.  Sternal wires and then used to wrap around the plates.  Should be noted the bone was treated with the demineralized bone matrix prior to closure.  The wound was then closed in layers; staples were used to reapproximate the skin.  All sponge instruments and needle counts were correct at the end of the case and I was present and participated in all aspects of the procedure. Findings: Good LV function and mediastinal and bilateral pleural effusions  Estimated Blood Loss:  Minimal         Drains: Fluted drains in bilateral pleural spaces and mediastinum         Blood Given: 500 CC PRBC          Specimens: None         Implants: DBX bone putty and sternal plating        Complications:  * No complications entered in OR log *         Disposition: To ICU, stable         Condition: stable  Ramiyah Mcclenahan Z. Orvan Seen, [place]

## 2018-12-04 NOTE — Progress Notes (Signed)
ANTICOAGULATION CONSULT NOTE - Follow Up Consult  Pharmacy Consult for Heparin Indication: Impella 5.0  Allergies  Allergen Reactions  . Penicillins Rash and Other (See Comments)    Did it involve swelling of the face/tongue/throat, SOB, or low BP? Unknown Did it involve sudden or severe rash/hives, skin peeling, or any reaction on the inside of your mouth or nose? Yes Did you need to seek medical attention at a hospital or doctor's office? Unknown When did it last happen? childhood If all above answers are "NO", may proceed with cephalosporin use.     Patient Measurements: Height: 5' 9" (175.3 cm) Weight: 203 lb 4.2 oz (92.2 kg) IBW/kg (Calculated) : 70.7 Heparin Dosing Weight: 90.4 kg  Vital Signs: Temp: 99.1 F (37.3 C) (10/02 1100) Temp Source: Core (10/02 0400) Pulse Rate: 104 (10/02 1100)  Labs: Recent Labs    12/02/18 0401  12/02/18 1457  12/03/18 0416 12/03/18 0443 12/04/18 0428 12/04/18 0433 12/04/18 0436  HGB 9.1*   < > 9.9*   < > 8.7* 8.5*  --  8.5* 11.2*  HCT 26.2*   < > 28.0*   < > 24.5* 25.0*  --  24.1* 33.0*  PLT 70*  --  68*  --  62*  --   --  144*  --   APTT 68*  --   --   --  55*  --  137*  --   --   HEPARINUNFRC 0.12*  --   --   --  <0.10*  --  0.53  --   --   CREATININE  --   --  1.38*  --  1.24  --  1.27*  --   --    < > = values in this interval not displayed.    Estimated Creatinine Clearance: 65 mL/min (A) (by C-G formula based on SCr of 1.27 mg/dL (H)).   Medications:  Scheduled:  . acetaminophen  1,000 mg Oral Q6H   Or  . acetaminophen (TYLENOL) oral liquid 160 mg/5 mL  1,000 mg Per Tube Q6H  . aspirin EC  325 mg Oral Daily   Or  . aspirin  324 mg Per Tube Daily  . bisacodyl  10 mg Oral Daily   Or  . bisacodyl  10 mg Rectal Daily  . chlorhexidine gluconate (MEDLINE KIT)  15 mL Mouth Rinse BID  . Chlorhexidine Gluconate Cloth  6 each Topical Daily  . docusate sodium  200 mg Oral Daily  . furosemide  60 mg Intravenous BID   . guaiFENesin  600 mg Oral BID  . insulin regular  0-10 Units Intravenous TID WC  . levalbuterol  0.63 mg Nebulization BID  . mouth rinse  15 mL Mouth Rinse BID  . pantoprazole  40 mg Oral Daily  . sacubitril-valsartan  1 tablet Oral BID  . sodium chloride flush  3 mL Intravenous Q12H  . spironolactone  12.5 mg Oral Daily   Infusions:  . sodium chloride Stopped (12/04/18 1056)  . sodium chloride    . sodium chloride 10 mL (11/30/18 1500)  . amiodarone 30 mg/hr (12/04/18 1100)  . dexmedetomidine (PRECEDEX) IV infusion Stopped (12/02/18 1610)  . epinephrine 3 mcg/min (12/04/18 1119)  . impella catheter heparin 50 unit/mL in dextrose 5%    . insulin Stopped (12/04/18 0558)  . lactated ringers    . lactated ringers 10 mL/hr at 12/04/18 1100  . milrinone 0.375 mcg/kg/min (12/04/18 1118)  . nitroGLYCERIN Stopped (12/03/18 2300)  . norepinephrine (  LEVOPHED) Adult infusion    . phenylephrine (NEO-SYNEPHRINE) Adult infusion Stopped (11/30/18 2223)  . potassium chloride 50 mL/hr at 12/04/18 1100  . vasopressin (PITRESSIN) infusion - *FOR SHOCK* Stopped (11/30/18 2331)    Assessment: 43 yoM admitted with NSTEMI now s/p CABG 9/28 with MVR. Pt required Impella 5.0 placement in OR. He was noted to have significant coagulopathy and had to be taken back to OR urgently for mediastinal post-op bleeding.  Systemic heparin started in addition to heparin in purge solution on 10/1. Purge infusion rate delivering 995 units/hr of heparin and systemic providing 900 units/hr.  Heparin level this morning was 0.53. ACT was 164. Moderate drainage in CT.   Possible pull of Impella today  Goal of Therapy:  ACT 160-180 Monitor platelets by anticoagulation protocol: Yes   Plan:  Stop systemic heparin in anticipation for Impella pull later today Continue dextrose + heparin purge solution  Vertis Kelch, PharmD PGY2 Cardiology Pharmacy Resident Phone [phone] 12/04/2018       11:27 AM  Please  check [email or web address] for unit-specific pharmacist phone numbers

## 2018-12-04 NOTE — Brief Op Note (Signed)
12/04/2018  4:12 PM  PATIENT:  Rogene Houston  68 y.o. male  PRE-OPERATIVE DIAGNOSIS:  ICM  POST-OPERATIVE DIAGNOSIS:  ICM  PROCEDURE:  Procedure(s): REMOVAL OF IMPELLA LEFT VENTRICULAR ASSIST DEVICE.  EVACUATION OF LEFT HEMOTHORAX (N/A) TRANSESOPHAGEAL ECHOCARDIOGRAM (TEE) (N/A) Sternal Plating with Sternal Rewiring  SURGEON:  Surgeon(s) and Role:    * Wonda Olds, MD - Primary  PHYSICIAN ASSISTANT: none  ASSISTANTS: staff   ANESTHESIA:   general  EBL: per anes  BLOOD ADMINISTERED:one unit prbc  DRAINS: 3 Chest Tube(s) in the mediastinum and bilateral pleural spaces   LOCAL MEDICATIONS USED:  NONE  SPECIMEN:  No Specimen  DISPOSITION OF SPECIMEN:  N/A  COUNTS:  YES  TOURNIQUET:  * No tourniquets in log *  DICTATION: .Note written in EPIC  PLAN OF CARE: Admit to inpatient   PATIENT DISPOSITION:  ICU - intubated and hemodynamically stable.   Delay start of Pharmacological VTE agent (>24hrs) due to surgical blood loss or risk of bleeding: yes  Japleen Tornow Z. Orvan Seen, [place]

## 2018-12-04 NOTE — OR Nursing (Signed)
1523 2 HEART CHARGE NURSE CALLED AND GIVEN 45 MIN. ETA

## 2018-12-04 NOTE — Progress Notes (Signed)
Patient ID: Curtis [name], male   DOB: 01-Apr-1950, 68 y.o.   MRN: [id number] EVENING ROUNDS NOTE :     [address]       [place] [address]             [phone]                 Day of Surgery Procedure(s) (LRB): REMOVAL OF IMPELLA LEFT VENTRICULAR ASSIST DEVICE.  EVACUATION OF LEFT HEMOTHORAX (N/A) TRANSESOPHAGEAL ECHOCARDIOGRAM (TEE) (N/A) Sternal Plating with Sternal Rewiring  Total Length of Stay:  LOS: 10 days  BP 125/67   Pulse 89   Temp (!) 97.5 F (36.4 C)   Resp (!) 23   Ht 5\' 9"  (1.753 m)   Wt 92.2 kg   SpO2 100%   BMI 30.02 kg/m   .Intake/Output      10/01 0701 - 10/02 0700 10/02 0701 - 10/03 0700   P.O. 120    I.V. (mL/kg) 1634.4 (17.7) 1974.9 (21.4)   Blood  617   Other 500 119.4   IV Piggyback 218.4 581.4   Total Intake(mL/kg) 2472.8 (26.8) 3292.8 (35.7)   Urine (mL/kg/hr) 4305 (1.9) 1600 (1.6)   Emesis/NG output     Other  500   Blood  100   Chest Tube 600 180   Total Output 4905 2380   Net -2432.2 +912.8          . sodium chloride Stopped (12/04/18 1251)  . sodium chloride 10 mL (11/30/18 1500)  . amiodarone 30 mg/hr (12/04/18 1725)  . dexmedetomidine (PRECEDEX) IV infusion 0.4 mcg/kg/hr (12/04/18 1724)  . epinephrine 3 mcg/min (12/04/18 1726)  . insulin 2.2 Units/hr (12/04/18 1722)  . lactated ringers    . milrinone 0.375 mcg/kg/min (12/04/18 1719)  . nitroGLYCERIN Stopped (12/03/18 2300)  . norepinephrine (LEVOPHED) Adult infusion 7 mcg/min (12/04/18 1753)  . phenylephrine (NEO-SYNEPHRINE) Adult infusion Stopped (11/30/18 2223)  . potassium chloride 10 mEq (12/04/18 1713)  . potassium chloride    . potassium chloride    . vasopressin (PITRESSIN) infusion - *FOR SHOCK* Stopped (11/30/18 2331)     Lab Results  Component Value Date   WBC 24.5 (H) 12/04/2018   HGB 9.3 (L) 12/04/2018   HCT 26.6 (L) 12/04/2018   PLT 121 (L) 12/04/2018   GLUCOSE 117 (H) 12/04/2018   CHOL 233 (H) 11/25/2018   TRIG 155 (H) 11/25/2018   HDL 31 (L) 11/25/2018   LDLCALC 171 (H) 11/25/2018   ALT 37 11/30/2018   AST 30 11/30/2018   NA 132 (L) 12/04/2018   K 3.5 12/04/2018   CL 101 12/04/2018   CREATININE 1.11 12/04/2018   BUN 18 12/04/2018   CO2 23 12/04/2018   TSH 0.350 11/24/2018   INR 1.5 (H) 11/30/2018   HGBA1C 5.8 (H) 11/24/2018    Chest xray improved impulla out On amnidrone , levaphed, milrinone, epi, insulin CI = 1.7 Not bleeding Starting to wake up  [name] MD  Beeper [phone] Office [phone] 12/04/2018 5:59 PM

## 2018-12-04 NOTE — OR Nursing (Signed)
1541 2 HEART CHARGE NURSE CALLED AND GIVEN 20 MIN ETA.

## 2018-12-04 NOTE — Anesthesia Procedure Notes (Signed)
Procedure Name: Intubation Date/Time: 12/04/2018 1:53 PM Performed by: Amadeo Garnet, CRNA Pre-anesthesia Checklist: Patient identified, Emergency Drugs available, Suction available, Patient being monitored and Timeout performed Patient Re-evaluated:Patient Re-evaluated prior to induction Oxygen Delivery Method: Circle system utilized Preoxygenation: Pre-oxygenation with 100% oxygen Induction Type: IV induction Ventilation: Mask ventilation without difficulty Laryngoscope Size: Mac and 3 Grade View: Grade II Tube type: Oral Tube size: 8.0 mm Number of attempts: 1 Airway Equipment and Method: Stylet Placement Confirmation: ETT inserted through vocal cords under direct vision,  positive ETCO2 and breath sounds checked- equal and bilateral Secured at: 22 cm Tube secured with: Tape Dental Injury: Teeth and Oropharynx as per pre-operative assessment

## 2018-12-04 NOTE — Transfer of Care (Signed)
Immediate Anesthesia Transfer of Care Note  Patient: Curtis [name]  Procedure(s) Performed: REMOVAL OF IMPELLA LEFT VENTRICULAR ASSIST DEVICE.  EVACUATION OF LEFT HEMOTHORAX (N/A ) TRANSESOPHAGEAL ECHOCARDIOGRAM (TEE) (N/A ) Sternal Plating with Sternal Rewiring  Patient Location: SICU  Anesthesia Type:General  Level of Consciousness: Patient remains intubated per anesthesia plan  Airway & Oxygen Therapy: Patient remains intubated per anesthesia plan and Patient placed on Ventilator (see vital sign flow sheet for setting)  Post-op Assessment: Report given to RN and Post -op Vital signs reviewed and stable  Post vital signs: Reviewed and stable  Last Vitals:  Vitals Value Taken Time  BP    Temp 36.3 C 12/04/18 1631  Pulse 80 12/04/18 1631  Resp 42 12/04/18 1631  SpO2 100 % 12/04/18 1631  Vitals shown include unvalidated device data.  Last Pain:  Vitals:   12/04/18 1200  TempSrc:   PainSc: 0-No pain         Complications: No apparent anesthesia complications

## 2018-12-04 NOTE — Progress Notes (Signed)
Rapid wean initiated per protocol  

## 2018-12-04 NOTE — Progress Notes (Signed)
Rapid wean terminated due to RR > 25.  Pt placed back on full support on documented settings. Pt tolerating well.

## 2018-12-05 ENCOUNTER — Inpatient Hospital Stay (HOSPITAL_COMMUNITY): Payer: Medicare Other

## 2018-12-05 LAB — CBC
HCT: 28.2 % — ABNORMAL LOW (ref 39.0–52.0)
Hemoglobin: 9.7 g/dL — ABNORMAL LOW (ref 13.0–17.0)
MCH: 31 pg (ref 26.0–34.0)
MCHC: 34.4 g/dL (ref 30.0–36.0)
MCV: 90.1 fL (ref 80.0–100.0)
Platelets: 128 10*3/uL — ABNORMAL LOW (ref 150–400)
RBC: 3.13 MIL/uL — ABNORMAL LOW (ref 4.22–5.81)
RDW: 18.1 % — ABNORMAL HIGH (ref 11.5–15.5)
WBC: 24.7 10*3/uL — ABNORMAL HIGH (ref 4.0–10.5)
nRBC: 0.8 % — ABNORMAL HIGH (ref 0.0–0.2)

## 2018-12-05 LAB — BASIC METABOLIC PANEL WITH GFR
Anion gap: 9 (ref 5–15)
BUN: 17 mg/dL (ref 8–23)
CO2: 19 mmol/L — ABNORMAL LOW (ref 22–32)
Calcium: 7.2 mg/dL — ABNORMAL LOW (ref 8.9–10.3)
Chloride: 104 mmol/L (ref 98–111)
Creatinine, Ser: 1.23 mg/dL (ref 0.61–1.24)
GFR calc Af Amer: >60 mL/min
GFR calc non Af Amer: >60 mL/min
Glucose, Bld: 131 mg/dL — ABNORMAL HIGH (ref 70–99)
Potassium: 3.8 mmol/L (ref 3.5–5.1)
Sodium: 132 mmol/L — ABNORMAL LOW (ref 135–145)

## 2018-12-05 LAB — GLUCOSE, CAPILLARY
Glucose-Capillary: 106 mg/dL — ABNORMAL HIGH (ref 70–99)
Glucose-Capillary: 108 mg/dL — ABNORMAL HIGH (ref 70–99)
Glucose-Capillary: 111 mg/dL — ABNORMAL HIGH (ref 70–99)
Glucose-Capillary: 116 mg/dL — ABNORMAL HIGH (ref 70–99)
Glucose-Capillary: 126 mg/dL — ABNORMAL HIGH (ref 70–99)
Glucose-Capillary: 127 mg/dL — ABNORMAL HIGH (ref 70–99)
Glucose-Capillary: 134 mg/dL — ABNORMAL HIGH (ref 70–99)
Glucose-Capillary: 141 mg/dL — ABNORMAL HIGH (ref 70–99)
Glucose-Capillary: 162 mg/dL — ABNORMAL HIGH (ref 70–99)
Glucose-Capillary: 74 mg/dL (ref 70–99)
Glucose-Capillary: 80 mg/dL (ref 70–99)
Glucose-Capillary: 91 mg/dL (ref 70–99)
Glucose-Capillary: 92 mg/dL (ref 70–99)
Glucose-Capillary: 94 mg/dL (ref 70–99)
Glucose-Capillary: 95 mg/dL (ref 70–99)
Glucose-Capillary: 104 mg/dL — ABNORMAL HIGH (ref 70–99)
Glucose-Capillary: 115 mg/dL — ABNORMAL HIGH (ref 70–99)
Glucose-Capillary: 126 mg/dL — ABNORMAL HIGH (ref 70–99)
Glucose-Capillary: 139 mg/dL — ABNORMAL HIGH (ref 70–99)
Glucose-Capillary: 200 mg/dL — ABNORMAL HIGH (ref 70–99)
Glucose-Capillary: 175 mg/dL — ABNORMAL HIGH (ref 70–99)
Glucose-Capillary: 139 mg/dL — ABNORMAL HIGH (ref 70–99)

## 2018-12-05 LAB — COOXEMETRY PANEL
Carboxyhemoglobin: 1.3 % (ref 0.5–1.5)
Methemoglobin: 0.9 % (ref 0.0–1.5)
O2 Saturation: 66.5 %
Total hemoglobin: 8.9 g/dL — ABNORMAL LOW (ref 12.0–16.0)

## 2018-12-05 LAB — POCT I-STAT 7, (LYTES, BLD GAS, ICA,H+H)
Acid-Base Excess: 1 mmol/L (ref 0.0–2.0)
Acid-Base Excess: 4 mmol/L — ABNORMAL HIGH (ref 0.0–2.0)
Acid-base deficit: 2 mmol/L (ref 0.0–2.0)
Bicarbonate: 21.3 mmol/L (ref 20.0–28.0)
Bicarbonate: 22.5 mmol/L (ref 20.0–28.0)
Bicarbonate: 26.5 mmol/L (ref 20.0–28.0)
Calcium, Ion: 1.1 mmol/L — ABNORMAL LOW (ref 1.15–1.40)
Calcium, Ion: 1.03 mmol/L — ABNORMAL LOW (ref 1.15–1.40)
Calcium, Ion: 1.05 mmol/L — ABNORMAL LOW (ref 1.15–1.40)
HCT: 27 % — ABNORMAL LOW (ref 39.0–52.0)
HCT: 27 % — ABNORMAL LOW (ref 39.0–52.0)
HCT: 31 % — ABNORMAL LOW (ref 39.0–52.0)
Hemoglobin: 9.2 g/dL — ABNORMAL LOW (ref 13.0–17.0)
Hemoglobin: 9.2 g/dL — ABNORMAL LOW (ref 13.0–17.0)
Hemoglobin: 10.5 g/dL — ABNORMAL LOW (ref 13.0–17.0)
O2 Saturation: 98 %
O2 Saturation: 98 %
O2 Saturation: 97 %
Patient temperature: 37.4
Potassium: 3.9 mmol/L (ref 3.5–5.1)
Potassium: 4.7 mmol/L (ref 3.5–5.1)
Potassium: 4.2 mmol/L (ref 3.5–5.1)
Sodium: 134 mmol/L — ABNORMAL LOW (ref 135–145)
Sodium: 133 mmol/L — ABNORMAL LOW (ref 135–145)
Sodium: 133 mmol/L — ABNORMAL LOW (ref 135–145)
TCO2: 22 mmol/L (ref 22–32)
TCO2: 23 mmol/L (ref 22–32)
TCO2: 28 mmol/L (ref 22–32)
pCO2 arterial: 32.4 mmHg (ref 32.0–48.0)
pCO2 arterial: 26.1 mmHg — ABNORMAL LOW (ref 32.0–48.0)
pCO2 arterial: 32.5 mmHg (ref 32.0–48.0)
pH, Arterial: 7.429 (ref 7.350–7.450)
pH, Arterial: 7.543 — ABNORMAL HIGH (ref 7.350–7.450)
pH, Arterial: 7.52 — ABNORMAL HIGH (ref 7.350–7.450)
pO2, Arterial: 96 mmHg (ref 83.0–108.0)
pO2, Arterial: 88 mmHg (ref 83.0–108.0)
pO2, Arterial: 80 mmHg — ABNORMAL LOW (ref 83.0–108.0)

## 2018-12-05 LAB — APTT: aPTT: 33 seconds (ref 24–36)

## 2018-12-05 MED ORDER — INSULIN DETEMIR 100 UNIT/ML ~~LOC~~ SOLN
5.0000 [IU] | Freq: Two times a day (BID) | SUBCUTANEOUS | Status: DC
  Administered 2018-12-05 – 2018-12-10 (×12): 5 [IU] via SUBCUTANEOUS
  Filled 2018-12-05 (×16): qty 0.05

## 2018-12-05 MED ORDER — INSULIN ASPART 100 UNIT/ML ~~LOC~~ SOLN
2.0000 [IU] | SUBCUTANEOUS | Status: DC
  Administered 2018-12-05: 4 [IU] via SUBCUTANEOUS
  Administered 2018-12-05: 2 [IU] via SUBCUTANEOUS
  Administered 2018-12-05: 4 [IU] via SUBCUTANEOUS
  Administered 2018-12-06 (×2): 2 [IU] via SUBCUTANEOUS
  Administered 2018-12-06: 4 [IU] via SUBCUTANEOUS
  Administered 2018-12-06 – 2018-12-08 (×6): 2 [IU] via SUBCUTANEOUS
  Administered 2018-12-10: 20:00:00 4 [IU] via SUBCUTANEOUS

## 2018-12-05 MED ORDER — POTASSIUM CHLORIDE 10 MEQ/50ML IV SOLN
10.0000 meq | INTRAVENOUS | Status: AC
  Administered 2018-12-05 (×4): 10 meq via INTRAVENOUS
  Filled 2018-12-05 (×4): qty 50

## 2018-12-05 MED ORDER — CHLORHEXIDINE GLUCONATE 0.12% ORAL RINSE (MEDLINE KIT)
15.0000 mL | Freq: Two times a day (BID) | OROMUCOSAL | Status: DC
  Administered 2018-12-05: 20:00:00 15 mL via OROMUCOSAL

## 2018-12-05 MED ORDER — ORAL CARE MOUTH RINSE
15.0000 mL | OROMUCOSAL | Status: DC
  Administered 2018-12-05 – 2018-12-06 (×8): 15 mL via OROMUCOSAL

## 2018-12-05 MED ORDER — ENOXAPARIN SODIUM 40 MG/0.4ML ~~LOC~~ SOLN
40.0000 mg | SUBCUTANEOUS | Status: DC
  Administered 2018-12-05 – 2018-12-11 (×7): 40 mg via SUBCUTANEOUS
  Filled 2018-12-05 (×7): qty 0.4

## 2018-12-05 MED ORDER — INSULIN ASPART 100 UNIT/ML ~~LOC~~ SOLN
0.0000 [IU] | SUBCUTANEOUS | Status: DC
  Administered 2018-12-05: 2 [IU] via SUBCUTANEOUS

## 2018-12-05 NOTE — Progress Notes (Signed)
Pt placed on CPAP/SP.  Pt's RR increased to 26-32 after a few minutes of CPAP/SP. Pt placed back to full vent settings.  Pt tolerating well.

## 2018-12-05 NOTE — Progress Notes (Signed)
Patient ID: Curtis [name], male   DOB: 1950/04/12, 68 y.o.   MRN: [id number]    Advanced Heart Failure Rounding Note   Subjective:    Impella removed + evacuation of hemothorax 10/2.   He remains on milrinone 0.375, epinephrine 3, NE 8.  Was on phenyephrine overnight.  On amiodarone 30 gtt with paroxysmal atrial fibrillation.  He is on Lasix 60 mg IV bid, weight down.   Swan numbers: RA 9 PA 24/19 Thermo 2.6  Awake on vent, follows commands.   WBCs stable 25, Tm 99.  He is not on abx.    Objective:   Weight Range:  Vital Signs:   Temp:  [97.5 F (36.4 C)-99.9 F (37.7 C)] 99.9 F (37.7 C) (10/03 0700) Pulse Rate:  [80-115] 113 (10/03 0746) Resp:  [18-40] 24 (10/03 0746) BP: (88-196)/(52-74) 196/73 (10/03 0746) SpO2:  [93 %-100 %] 99 % (10/03 0746) Arterial Line BP: (81-136)/(40-70) 106/48 (10/03 0700) FiO2 (%):  [40 %-80 %] 40 % (10/03 0746) Last BM Date: 12/04/18  Weight change: Filed Weights   12/02/18 1300 12/03/18 0600 12/04/18 0600  Weight: 100.1 kg 94.3 kg 92.2 kg    Intake/Output:   Intake/Output Summary (Last 24 hours) at 12/05/2018 0813 Last data filed at 12/05/2018 0800 Gross per 24 hour  Intake 4621.6 ml  Output 3425 ml  Net 1196.6 ml     Physical Exam: General: NAD, intubated Neck: JVP difficult, Swan right IJ, no thyromegaly or thyroid nodule.  Lungs: Decreased at bases.  CV: Nondisplaced PMI.  Heart mildly tachy, regular S1/S2, no S3/S4, no murmur. 1+ ankle edema.  Abdomen: Soft, nontender, no hepatosplenomegaly, no distention.  Skin: Intact without lesions or rashes.  Neurologic: Alert and oriented x 3.  Psych: Normal affect. Extremities: No clubbing or cyanosis.  HEENT: Normal.   Telemetry: NSR around 100 bpm Personally reviewed  Labs: Basic Metabolic Panel: Recent Labs  Lab 11/30/18 2019  12/01/18 0127  12/01/18 1545  12/02/18 1457  12/03/18 0416  12/04/18 0428  12/04/18 1143  12/04/18 1642 12/04/18 1646 12/04/18 1736  12/05/18 0401 12/05/18 0420  NA  --    < > 139   < > 141   < > 138   < > 134*   < > 134*   < > 133*   < > 132* 134* 135 132* 134*  K  --    < > 4.1   < > 3.5   < > 3.6   < > 3.7   < > 2.9*   < > 3.7   < > 3.5 3.5 3.8 3.8 3.9  CL  --    < > 108  --  106  --  104  --  103  --  98  --   --   --  101  --   --  104  --   CO2  --    < > 23  --  25  --  24  --  24  --  25  --   --   --  23  --   --  19*  --   GLUCOSE  --    < > 122*  --  102*  --  125*  --  153*  --  125*  --  135*  --  117* 116*  --  131*  --   BUN  --    < > 24*  --  22  --  18  --  19  --  21  --   --   --  18  --   --  17  --   CREATININE  --    < > 1.34*  --  1.36*  --  1.38*  --  1.24  --  1.27*  --   --   --  1.11  --   --  1.23  --   CALCIUM  --    < > 8.1*  --  7.5*  --  7.7*  --  7.5*  --  7.5*  --   --   --  7.5*  --   --  7.2*  --   MG 3.2*  --  2.6*  --  2.0  --  1.7  --  2.0  --   --   --   --   --   --   --   --   --   --   PHOS  --   --   --   --   --   --   --   --  3.6  --   --   --   --   --   --   --   --   --   --    < > = values in this interval not displayed.    Liver Function Tests: Recent Labs  Lab 11/29/18 0400 11/30/18 0500  AST 25 30  ALT 30 37  ALKPHOS 75 78  BILITOT 2.0* 2.3*  PROT 7.2 6.9  ALBUMIN 4.0 3.9   No results for input(s): LIPASE, AMYLASE in the last 168 hours. No results for input(s): AMMONIA in the last 168 hours.  CBC: Recent Labs  Lab 12/02/18 1457  12/03/18 0416  12/04/18 0433  12/04/18 1642 12/04/18 1646 12/04/18 1736 12/05/18 0401 12/05/18 0420  WBC 15.9*  --  15.7*  --  28.8*  --  24.5*  --   --  24.7*  --   NEUTROABS  --   --  12.8*  --   --   --  18.9*  --   --   --   --   HGB 9.9*   < > 8.7*   < > 8.5*   < > 9.3* 8.8* 9.2* 9.7* 9.2*  HCT 28.0*   < > 24.5*   < > 24.1*   < > 26.6* 26.0* 27.0* 28.2* 27.0*  MCV 88.9  --  90.4  --  91.3  --  89.3  --   --  90.1  --   PLT 68*  --  62*  --  144*  --  121*  --   --  128*  --    < > = values in this interval not  displayed.    Cardiac Enzymes: No results for input(s): CKTOTAL, CKMB, CKMBINDEX, TROPONINI in the last 168 hours.  BNP: BNP (last 3 results) Recent Labs    11/23/18 2328  BNP 507.9*    ProBNP (last 3 results) No results for input(s): PROBNP in the last 8760 hours.    Other results:  Imaging: Dg Chest 1 View  Result Date: 12/04/2018 CLINICAL DATA:  Open heart surgery EXAM: CHEST  1 VIEW COMPARISON:  12/04/2018, 8:20 a.m. FINDINGS: Interval repositioning of a left-sided chest tube with resolution of a previously seen fluid collection about the left upper lobe. There is likely a small, layering left pleural effusion. Interval repositioning of right-sided chest tube.  Mediastinal drainage tubes remain in position. The right lung is normally aerated. There has been interval endotracheal intubation, tube tip over the mid trachea. Esophagogastric tube with tip and side port below the diaphragm. Right neck pulmonary artery catheter remains with tip projecting over the pulmonary outflow tract. Interval removal of Impella pump. Cardiomegaly status post median sternotomy with aortic valve prosthesis. IMPRESSION: 1. Interval repositioning of a left-sided chest tube with resolution of a previously seen fluid collection about the left upper lobe. There is likely a small, layering left pleural effusion. Interval repositioning of right-sided chest tube. Mediastinal drainage tubes remain in position. The right lung is normally aerated. 2. There has been interval endotracheal intubation, tube tip over the mid trachea. Esophagogastric tube with tip and side port below the diaphragm. Right neck pulmonary artery catheter remains with tip projecting over the pulmonary outflow tract. 3.  Interval removal of Impella pump. 4. Cardiomegaly status post median sternotomy with aortic valve prosthesis. Electronically Signed   By: Eddie Candle M.D.   On: 12/04/2018 16:59   Dg Chest Port 1 View  Result Date: 12/05/2018  CLINICAL DATA:  Removal of the left ventricular assist device. EXAM: PORTABLE CHEST 1 VIEW COMPARISON:  12/04/2018 FINDINGS: Swan-Ganz catheter with the tip projecting over the right ventricular outflow tract. Nasogastric tube projecting over the stomach. Endotracheal tube with the tip 2.4 cm above the carina. Bilateral chest tubes in unchanged position. No pneumothorax. Small left pleural effusion unchanged from the prior exam. Mild left basilar atelectasis. Stable cardiomediastinal silhouette. Prior median sternotomy, CABG, and valve replacement. IMPRESSION: 1. Support lines and tubing in satisfactory unchanged position. 2. Bilateral chest tubes without a pneumothorax. 3. Trace left pleural effusion and left basilar atelectasis. Electronically Signed   By: Kathreen Devoid   On: 12/05/2018 07:24   Dg Chest Port 1 View  Result Date: 12/04/2018 CLINICAL DATA:  Status post coronary bypass grafting EXAM: PORTABLE CHEST 1 VIEW COMPARISON:  12/03/2018 FINDINGS: Cardiac shadow is enlarged. Postsurgical changes are again seen. Impella catheter is noted and stable in appearance. Swan-Ganz catheter is noted in the pulmonary outflow tract. Mediastinal drain and bilateral thoracostomy catheters are seen. No pneumothorax is noted although there is increased density in the left apex laterally when compared with the prior exam. This is consistent with a loculated fluid collection and is significantly larger than that seen on the prior exam now measuring approximately 8.8 by 5.4 cm in greatest dimension. Increase in left-sided pleural effusion is noted as well. No pneumothorax is noted. IMPRESSION: Increasing ovoid opacification in the lateral aspect of left upper hemithorax consistent with a loculated fluid collection. The possibility of focal hematoma deserves consideration. Slight increase in overall left pleural effusion inferiorly. Tubes and lines as described above. These results will be called to the ordering clinician or  representative by the Radiologist Assistant, and communication documented in the PACS or zVision Dashboard. Electronically Signed   By: Inez Catalina M.D.   On: 12/04/2018 08:42     Medications:     Scheduled Medications: . sodium chloride   Intravenous Once  . acetaminophen  1,000 mg Oral Q6H   Or  . acetaminophen (TYLENOL) oral liquid 160 mg/5 mL  1,000 mg Per Tube Q6H  . aspirin EC  325 mg Oral Daily   Or  . aspirin  324 mg Per Tube Daily  . bisacodyl  10 mg Oral Daily   Or  . bisacodyl  10 mg Rectal Daily  . chlorhexidine gluconate (MEDLINE KIT)  15 mL Mouth Rinse BID  . Chlorhexidine Gluconate Cloth  6 each Topical Daily  . docusate sodium  200 mg Oral Daily  . furosemide  60 mg Intravenous BID  . guaiFENesin  600 mg Oral BID  . insulin aspart  0-15 Units Subcutaneous Q4H  . levalbuterol  0.63 mg Nebulization BID  . mouth rinse  15 mL Mouth Rinse BID  . pantoprazole  40 mg Oral Daily  . sodium chloride flush  3 mL Intravenous Q12H  . spironolactone  12.5 mg Oral Daily    Infusions: . sodium chloride Stopped (12/04/18 1251)  . sodium chloride 10 mL/hr at 12/04/18 1800  . amiodarone 30 mg/hr (12/05/18 0800)  . dexmedetomidine (PRECEDEX) IV infusion 0.2 mcg/kg/hr (12/04/18 2309)  . epinephrine 3 mcg/min (12/05/18 0800)  . lactated ringers    . lactated ringers 10 mL/hr at 12/05/18 0800  . milrinone 0.375 mcg/kg/min (12/05/18 0800)  . nitroGLYCERIN Stopped (12/03/18 2300)  . norepinephrine (LEVOPHED) Adult infusion 8 mcg/min (12/05/18 0800)  . phenylephrine (NEO-SYNEPHRINE) Adult infusion Stopped (12/05/18 0748)  . potassium chloride    . vasopressin (PITRESSIN) infusion - *FOR SHOCK* Stopped (11/30/18 2331)    PRN Medications: sodium chloride, hydrALAZINE, metoprolol tartrate, morphine injection, ondansetron (ZOFRAN) IV, oxyCODONE, traMADol   Assessment/Plan:   1. Post-cardiotomy cardiogenic shock in setting if severe iCM and post-operative bleeding/hemorrhagic  shock - pre-op echo EF 25-30% - s/p CABG & bioprosthetic MVR with Impella palcement on 9/28 c/b severe coagulopathy/bleeding requiring OR takeback  - On 10/2 Impella removal and evacuation of left hemothorax - This morning, he is on norepinephrine 8, epinephrine 3, milrinone 0.375.  Good cardiac output off Swan (CI 2.6) and stable BP.  Today, will work on weaning down epinephrine, then norepinephrine.  - CVP 9, will continue Lasix 60 mg IV bid to try to keep I/Os around even. Weight has trended down.   2. Acute hypoxemic respiratory failure - Extubated 9/30 - Evacuation hemothorax in OR 10/2 and re-intubated.  - Vent weaning today as able.  Diurese to keep I/O  3. Severe MR - s/p bioprosthetic MVR. Stable   4. Acute blood loss anemia - transfuse to keep hgb >= 8.0 - hgb 9.7  5. CAD with NSTEMI - s/p CABG 9/28 - on ASA/statin - b-blocker and Plavix when ready  6. Thrombocytopenia - PLTs 68k -> 62K -> 144k -> 121 -> 128 - no bleeding   7. PAF - back in NSR with amio gtt, continue while on inotropes/pressors.   8. DVT prophylaxis - start Lovenox.   CRITICAL CARE Performed by: Loralie Champagne  Total critical care time: 40 minutes  Critical care time was exclusive of separately billable procedures and treating other patients.  Critical care was necessary to treat or prevent imminent or life-threatening deterioration.  Critical care was time spent personally by me (independent of midlevel providers or residents) on the following activities: development of treatment plan with patient and/or surrogate as well as nursing, discussions with consultants, evaluation of patient's response to treatment, examination of patient, obtaining history from patient or surrogate, ordering and performing treatments and interventions, ordering and review of laboratory studies, ordering and review of radiographic studies, pulse oximetry and re-evaluation of patient's condition.   Length of Stay:  11   Loralie Champagne MD 12/05/2018, 8:13 AM  Advanced Heart Failure Team Pager [phone] (M-F; 7a - 4p)  Please contact [facility] for night-coverage after hours (4p -7a ) and weekends on [email or web address]

## 2018-12-05 NOTE — Procedures (Signed)
Extubation Procedure Note  Patient Details:   Name: Curtis [name] DOB: 1950-05-02 MRN: [id number]   Airway Documentation:    Vent end date: 12/05/18 Vent end time: 1600   Evaluation  O2 sats: stable throughout Complications: No apparent complications Patient did tolerate procedure well. Bilateral Breath Sounds: Diminished, Clear   Yes   Pt was extubated per MD order and placed on 3 L [place]. Cuff leak was noted prior to extubation. Pt is stable at this time. RT will continue to monitor.   Ronaldo Miyamoto 12/05/2018, 4:03 PM

## 2018-12-05 NOTE — Progress Notes (Signed)
NIF and VC performed.  NIF -8 VC .25ml.  Pt complains of pain, and doesn't want to attempt NIF or VC again. Pt placed back to full support to allow to rest.

## 2018-12-05 NOTE — Progress Notes (Signed)
Rapid wean initiated per protocol  

## 2018-12-05 NOTE — Anesthesia Postprocedure Evaluation (Signed)
Anesthesia Post Note  Patient: Curtis [name]  Procedure(s) Performed: REMOVAL OF IMPELLA LEFT VENTRICULAR ASSIST DEVICE.  EVACUATION OF LEFT HEMOTHORAX (N/A ) TRANSESOPHAGEAL ECHOCARDIOGRAM (TEE) (N/A ) Sternal Plating with Sternal Rewiring     Patient location during evaluation: SICU Anesthesia Type: General Level of consciousness: sedated Pain management: pain level controlled Vital Signs Assessment: post-procedure vital signs reviewed and stable Respiratory status: patient remains intubated per anesthesia plan Cardiovascular status: stable Postop Assessment: no apparent nausea or vomiting Anesthetic complications: no    Last Vitals:  Vitals:   12/05/18 0700 12/05/18 0746  BP:  (!) 196/73  Pulse: 99 (!) 113  Resp: (!) 23 (!) 24  Temp: 37.7 C   SpO2: 99% 99%    Last Pain:  Vitals:   12/05/18 0400  TempSrc:   PainSc: 0-No pain                 Catalina Gravel

## 2018-12-05 NOTE — Progress Notes (Signed)
Patient ID: Curtis [name], male   DOB: Feb 18, 1951, 68 y.o.   MRN: [id number] [facility] DAILY ICU PROGRESS NOTE                   [address]            ,[place] [address]          [phone]   1 Day Post-Op Procedure(s) (LRB): REMOVAL OF IMPELLA LEFT VENTRICULAR ASSIST DEVICE.  EVACUATION OF LEFT HEMOTHORAX (N/A) TRANSESOPHAGEAL ECHOCARDIOGRAM (TEE) (N/A) Sternal Plating with Sternal Rewiring  Total Length of Stay:  LOS: 11 days   Subjective: Patient mildly sedated, intubated but alert and follows commands, lifts his head off the pillow without difficulty  Objective: Vital signs in last 24 hours: Temp:  [97.5 F (36.4 C)-99.9 F (37.7 C)] 99.9 F (37.7 C) (10/03 0700) Pulse Rate:  [80-115] 113 (10/03 0746) Cardiac Rhythm: Sinus tachycardia (10/03 0800) Resp:  [18-40] 24 (10/03 0746) BP: (88-196)/(52-74) 196/73 (10/03 0746) SpO2:  [93 %-100 %] 99 % (10/03 0746) Arterial Line BP: (81-136)/(40-70) 106/48 (10/03 0700) FiO2 (%):  [40 %-80 %] 40 % (10/03 0746)  Filed Weights   12/02/18 1300 12/03/18 0600 12/04/18 0600  Weight: 100.1 kg 94.3 kg 92.2 kg    Weight change:    Hemodynamic parameters for last 24 hours: PAP: (5-45)/(1-28) 19/12 CVP:  [3 mmHg-18 mmHg] 5 mmHg CO:  [3.5 L/min-5.2 L/min] 5.2 L/min CI:  [1.7 L/min/m2-2.6 L/min/m2] 2.6 L/min/m2  Intake/Output from previous day: 10/02 0701 - 10/03 0700 In: 4686.2 [I.V.:3205.5; Blood:617; IV Piggyback:744.4] Out: 7371 [Urine:2245; Blood:100; Chest Tube:600]  Intake/Output this shift: Total I/O In: 94.4 [I.V.:94.4] Out: 175 [Urine:85; Chest Tube:90]  Current Meds: Scheduled Meds:  sodium chloride   Intravenous Once   acetaminophen  1,000 mg Oral Q6H   Or   acetaminophen (TYLENOL) oral liquid 160 mg/5 mL  1,000 mg Per Tube Q6H   aspirin EC  325 mg Oral Daily   Or   aspirin  324 mg Per Tube Daily   bisacodyl  10 mg Oral Daily   Or   bisacodyl  10 mg Rectal Daily   chlorhexidine gluconate  (MEDLINE KIT)  15 mL Mouth Rinse BID   Chlorhexidine Gluconate Cloth  6 each Topical Daily   docusate sodium  200 mg Oral Daily   furosemide  60 mg Intravenous BID   guaiFENesin  600 mg Oral BID   insulin aspart  0-15 Units Subcutaneous Q4H   levalbuterol  0.63 mg Nebulization BID   mouth rinse  15 mL Mouth Rinse BID   pantoprazole  40 mg Oral Daily   sodium chloride flush  3 mL Intravenous Q12H   spironolactone  12.5 mg Oral Daily   Continuous Infusions:  sodium chloride Stopped (12/04/18 1251)   sodium chloride 10 mL/hr at 12/04/18 1800   amiodarone 30 mg/hr (12/05/18 0800)   dexmedetomidine (PRECEDEX) IV infusion 0.1 mcg/kg/hr (12/05/18 0700)   epinephrine 3 mcg/min (12/05/18 0800)   lactated ringers     lactated ringers 10 mL/hr at 12/05/18 0800   milrinone 0.375 mcg/kg/min (12/05/18 0800)   nitroGLYCERIN Stopped (12/03/18 2300)   norepinephrine (LEVOPHED) Adult infusion 8 mcg/min (12/05/18 0800)   phenylephrine (NEO-SYNEPHRINE) Adult infusion Stopped (12/05/18 0748)   potassium chloride     vasopressin (PITRESSIN) infusion - *FOR SHOCK* Stopped (11/30/18 2331)   PRN Meds:.sodium chloride, hydrALAZINE, metoprolol tartrate, morphine injection, ondansetron (ZOFRAN) IV, oxyCODONE, traMADol  General appearance: alert, cooperative and no distress Neurologic:  intact Heart: regular rate and rhythm, S1, S2 normal, no murmur, click, rub or gallop Lungs: diminished breath sounds bilaterally Abdomen: soft, non-tender; bowel sounds normal; no masses,  no organomegaly Extremities: extremities normal, atraumatic, no cyanosis or edema and Homans sign is negative, no sign of DVT Wound: Sternal dressing intact  Lab Results: CBC: Recent Labs    12/04/18 1642  12/05/18 0401 12/05/18 0420  WBC 24.5*  --  24.7*  --   HGB 9.3*   < > 9.7* 9.2*  HCT 26.6*   < > 28.2* 27.0*  PLT 121*  --  128*  --    < > = values in this interval not displayed.   BMET:  Recent  Labs    12/04/18 1642 12/04/18 1646  12/05/18 0401 12/05/18 0420  NA 132* 134*   < > 132* 134*  K 3.5 3.5   < > 3.8 3.9  CL 101  --   --  104  --   CO2 23  --   --  19*  --   GLUCOSE 117* 116*  --  131*  --   BUN 18  --   --  17  --   CREATININE 1.11  --   --  1.23  --   CALCIUM 7.5*  --   --  7.2*  --    < > = values in this interval not displayed.    CMET: Lab Results  Component Value Date   WBC 24.7 (H) 12/05/2018   HGB 9.2 (L) 12/05/2018   HCT 27.0 (L) 12/05/2018   PLT 128 (L) 12/05/2018   GLUCOSE 131 (H) 12/05/2018   CHOL 233 (H) 11/25/2018   TRIG 155 (H) 11/25/2018   HDL 31 (L) 11/25/2018   LDLCALC 171 (H) 11/25/2018   ALT 37 11/30/2018   AST 30 11/30/2018   NA 134 (L) 12/05/2018   K 3.9 12/05/2018   CL 104 12/05/2018   CREATININE 1.23 12/05/2018   BUN 17 12/05/2018   CO2 19 (L) 12/05/2018   TSH 0.350 11/24/2018   INR 1.5 (H) 11/30/2018   HGBA1C 5.8 (H) 11/24/2018      PT/INR: No results for input(s): LABPROT, INR in the last 72 hours. Radiology: Dg Chest 1 View  Result Date: 12/04/2018 CLINICAL DATA:  Open heart surgery EXAM: CHEST  1 VIEW COMPARISON:  12/04/2018, 8:20 a.m. FINDINGS: Interval repositioning of a left-sided chest tube with resolution of a previously seen fluid collection about the left upper lobe. There is likely a small, layering left pleural effusion. Interval repositioning of right-sided chest tube. Mediastinal drainage tubes remain in position. The right lung is normally aerated. There has been interval endotracheal intubation, tube tip over the mid trachea. Esophagogastric tube with tip and side port below the diaphragm. Right neck pulmonary artery catheter remains with tip projecting over the pulmonary outflow tract. Interval removal of Impella pump. Cardiomegaly status post median sternotomy with aortic valve prosthesis. IMPRESSION: 1. Interval repositioning of a left-sided chest tube with resolution of a previously seen fluid collection  about the left upper lobe. There is likely a small, layering left pleural effusion. Interval repositioning of right-sided chest tube. Mediastinal drainage tubes remain in position. The right lung is normally aerated. 2. There has been interval endotracheal intubation, tube tip over the mid trachea. Esophagogastric tube with tip and side port below the diaphragm. Right neck pulmonary artery catheter remains with tip projecting over the pulmonary outflow tract. 3.  Interval removal of Impella pump.  4. Cardiomegaly status post median sternotomy with aortic valve prosthesis. Electronically Signed   By: Eddie Candle M.D.   On: 12/04/2018 16:59   Dg Chest Port 1 View  Result Date: 12/05/2018 CLINICAL DATA:  Removal of the left ventricular assist device. EXAM: PORTABLE CHEST 1 VIEW COMPARISON:  12/04/2018 FINDINGS: Swan-Ganz catheter with the tip projecting over the right ventricular outflow tract. Nasogastric tube projecting over the stomach. Endotracheal tube with the tip 2.4 cm above the carina. Bilateral chest tubes in unchanged position. No pneumothorax. Small left pleural effusion unchanged from the prior exam. Mild left basilar atelectasis. Stable cardiomediastinal silhouette. Prior median sternotomy, CABG, and valve replacement. IMPRESSION: 1. Support lines and tubing in satisfactory unchanged position. 2. Bilateral chest tubes without a pneumothorax. 3. Trace left pleural effusion and left basilar atelectasis. Electronically Signed   By: Kathreen Devoid   On: 12/05/2018 07:24     Assessment/Plan: S/P Procedure(s) (LRB): REMOVAL OF IMPELLA LEFT VENTRICULAR ASSIST DEVICE.  EVACUATION OF LEFT HEMOTHORAX (N/A) TRANSESOPHAGEAL ECHOCARDIOGRAM (TEE) (N/A) Sternal Plating with Sternal Rewiring Diabetes control Working on weaning and extubating today    [name] 12/05/2018 8:48 AM

## 2018-12-05 NOTE — Plan of Care (Signed)
Spoke with Aundra Dubin, MD regarding the [facility] glycemic protocol for the pt. New VO given. Will continue to monitor pt.

## 2018-12-05 NOTE — Progress Notes (Signed)
Patient ID: Curtis [name], male   DOB: 12-10-1950, 68 y.o.   MRN: [id number] EVENING ROUNDS NOTE :     [address]       [place] [address]             [phone]                 1 Day Post-Op Procedure(s) (LRB): REMOVAL OF IMPELLA LEFT VENTRICULAR ASSIST DEVICE.  EVACUATION OF LEFT HEMOTHORAX (N/A) TRANSESOPHAGEAL ECHOCARDIOGRAM (TEE) (N/A) Sternal Plating with Sternal Rewiring  Total Length of Stay:  LOS: 11 days  BP (!) 131/56   Pulse (!) 106   Temp 99.3 F (37.4 C)   Resp (!) 32   Ht 5\' 9"  (1.753 m)   Wt 92.2 kg   SpO2 95%   BMI 30.02 kg/m   .Intake/Output      10/02 0701 - 10/03 0700 10/03 0701 - 10/04 0700   P.O.  0   I.V. (mL/kg) 3205.5 (34.8) 703.7 (7.6)   Blood 617    Other 119.4    NG/GT  220   IV Piggyback 744.4 173.3   Total Intake(mL/kg) 4686.2 (50.8) 1097.1 (11.9)   Urine (mL/kg/hr) 2245 (1) 1698 (1.7)   Emesis/NG output  130   Other 500    Blood 100    Chest Tube 600 270   Total Output 3445 2098   Net +1241.2 -1001          . sodium chloride Stopped (12/04/18 1251)  . sodium chloride 10 mL/hr at 12/04/18 1800  . amiodarone 30 mg/hr (12/05/18 1600)  . dexmedetomidine (PRECEDEX) IV infusion Stopped (12/05/18 1621)  . epinephrine 2 mcg/min (12/05/18 1600)  . lactated ringers    . lactated ringers 10 mL/hr at 12/05/18 1600  . milrinone 0.375 mcg/kg/min (12/05/18 1600)  . nitroGLYCERIN Stopped (12/03/18 2300)  . norepinephrine (LEVOPHED) Adult infusion 6 mcg/min (12/05/18 1723)  . phenylephrine (NEO-SYNEPHRINE) Adult infusion Stopped (12/05/18 0748)  . vasopressin (PITRESSIN) infusion - *FOR SHOCK* Stopped (11/30/18 2331)     Lab Results  Component Value Date   WBC 24.7 (H) 12/05/2018   HGB 9.2 (L) 12/05/2018   HCT 27.0 (L) 12/05/2018   PLT 128 (L) 12/05/2018   GLUCOSE 131 (H) 12/05/2018   CHOL 233 (H) 11/25/2018   TRIG 155 (H) 11/25/2018   HDL 31 (L) 11/25/2018   LDLCALC 171 (H) 11/25/2018   ALT 37 11/30/2018   AST 30  11/30/2018   NA 133 (L) 12/05/2018   K 4.7 12/05/2018   CL 104 12/05/2018   CREATININE 1.23 12/05/2018   BUN 17 12/05/2018   CO2 19 (L) 12/05/2018   TSH 0.350 11/24/2018   INR 1.5 (H) 11/30/2018   HGBA1C 5.8 (H) 11/24/2018   Now extubated bp stable on milrinone levaphed and Jean Rosenthal MD  Beeper [phone] Office [phone] 12/05/2018 6:01 PM

## 2018-12-06 ENCOUNTER — Inpatient Hospital Stay (HOSPITAL_COMMUNITY): Payer: Medicare Other

## 2018-12-06 LAB — COOXEMETRY PANEL
Carboxyhemoglobin: 1.4 % (ref 0.5–1.5)
Methemoglobin: 0.4 % (ref 0.0–1.5)
O2 Saturation: 62.7 %
Total hemoglobin: 9.9 g/dL — ABNORMAL LOW (ref 12.0–16.0)

## 2018-12-06 LAB — COMPREHENSIVE METABOLIC PANEL
ALT: 21 U/L (ref 0–44)
AST: 19 U/L (ref 15–41)
Albumin: 2.4 g/dL — ABNORMAL LOW (ref 3.5–5.0)
Alkaline Phosphatase: 58 U/L (ref 38–126)
Anion gap: 10 (ref 5–15)
BUN: 17 mg/dL (ref 8–23)
CO2: 26 mmol/L (ref 22–32)
Calcium: 7.6 mg/dL — ABNORMAL LOW (ref 8.9–10.3)
Chloride: 97 mmol/L — ABNORMAL LOW (ref 98–111)
Creatinine, Ser: 1.16 mg/dL (ref 0.61–1.24)
GFR calc Af Amer: >60 mL/min (ref >60)
GFR calc non Af Amer: >60 mL/min (ref >60)
Glucose, Bld: 140 mg/dL — ABNORMAL HIGH (ref 70–99)
Potassium: 3.2 mmol/L — ABNORMAL LOW (ref 3.5–5.1)
Sodium: 133 mmol/L — ABNORMAL LOW (ref 135–145)
Total Bilirubin: 3.4 mg/dL — ABNORMAL HIGH (ref 0.3–1.2)
Total Protein: 5 g/dL — ABNORMAL LOW (ref 6.5–8.1)

## 2018-12-06 LAB — BPAM RBC
Blood Product Expiration Date: 202010292359
Blood Product Expiration Date: 202010302359
Blood Product Expiration Date: 202010302359
Blood Product Expiration Date: 202011052359
ISSUE DATE / TIME: 202010021426
ISSUE DATE / TIME: 202010021426
Unit Type and Rh: 5100
Unit Type and Rh: 5100
Unit Type and Rh: 5100
Unit Type and Rh: 5100

## 2018-12-06 LAB — POCT I-STAT 7, (LYTES, BLD GAS, ICA,H+H)
Acid-Base Excess: 4 mmol/L — ABNORMAL HIGH (ref 0.0–2.0)
Acid-Base Excess: 6 mmol/L — ABNORMAL HIGH (ref 0.0–2.0)
Bicarbonate: 25 mmol/L (ref 20.0–28.0)
Bicarbonate: 28.2 mmol/L — ABNORMAL HIGH (ref 20.0–28.0)
Calcium, Ion: 1.05 mmol/L — ABNORMAL LOW (ref 1.15–1.40)
Calcium, Ion: 1.05 mmol/L — ABNORMAL LOW (ref 1.15–1.40)
HCT: 32 % — ABNORMAL LOW (ref 39.0–52.0)
HCT: 37 % — ABNORMAL LOW (ref 39.0–52.0)
Hemoglobin: 10.9 g/dL — ABNORMAL LOW (ref 13.0–17.0)
Hemoglobin: 12.6 g/dL — ABNORMAL LOW (ref 13.0–17.0)
O2 Saturation: 91 %
O2 Saturation: 98 %
Patient temperature: 97.6
Patient temperature: 98.8
Potassium: 3.7 mmol/L (ref 3.5–5.1)
Potassium: 3.7 mmol/L (ref 3.5–5.1)
Sodium: 133 mmol/L — ABNORMAL LOW (ref 135–145)
Sodium: 133 mmol/L — ABNORMAL LOW (ref 135–145)
TCO2: 26 mmol/L (ref 22–32)
TCO2: 29 mmol/L (ref 22–32)
pCO2 arterial: 25.7 mmHg — ABNORMAL LOW (ref 32.0–48.0)
pCO2 arterial: 31.1 mmHg — ABNORMAL LOW (ref 32.0–48.0)
pH, Arterial: 7.595 — ABNORMAL HIGH (ref 7.350–7.450)
pH, Arterial: 7.567 — ABNORMAL HIGH (ref 7.350–7.450)
pO2, Arterial: 47 mmHg — ABNORMAL LOW (ref 83.0–108.0)
pO2, Arterial: 82 mmHg — ABNORMAL LOW (ref 83.0–108.0)

## 2018-12-06 LAB — TYPE AND SCREEN
ABO/RH(D): O POS
Antibody Screen: NEGATIVE
Unit division: 0
Unit division: 0
Unit division: 0
Unit division: 0

## 2018-12-06 LAB — MAGNESIUM: Magnesium: 1.9 mg/dL (ref 1.7–2.4)

## 2018-12-06 LAB — GLUCOSE, CAPILLARY
Glucose-Capillary: 105 mg/dL — ABNORMAL HIGH (ref 70–99)
Glucose-Capillary: 126 mg/dL — ABNORMAL HIGH (ref 70–99)
Glucose-Capillary: 127 mg/dL — ABNORMAL HIGH (ref 70–99)
Glucose-Capillary: 130 mg/dL — ABNORMAL HIGH (ref 70–99)
Glucose-Capillary: 138 mg/dL — ABNORMAL HIGH (ref 70–99)
Glucose-Capillary: 160 mg/dL — ABNORMAL HIGH (ref 70–99)

## 2018-12-06 LAB — CBC
HCT: 28.8 % — ABNORMAL LOW (ref 39.0–52.0)
Hemoglobin: 9.7 g/dL — ABNORMAL LOW (ref 13.0–17.0)
MCH: 30.8 pg (ref 26.0–34.0)
MCHC: 33.7 g/dL (ref 30.0–36.0)
MCV: 91.4 fL (ref 80.0–100.0)
Platelets: 127 10*3/uL — ABNORMAL LOW (ref 150–400)
RBC: 3.15 MIL/uL — ABNORMAL LOW (ref 4.22–5.81)
RDW: 18.6 % — ABNORMAL HIGH (ref 11.5–15.5)
WBC: 18.4 10*3/uL — ABNORMAL HIGH (ref 4.0–10.5)
nRBC: 0.7 % — ABNORMAL HIGH (ref 0.0–0.2)

## 2018-12-06 MED ORDER — CHLORHEXIDINE GLUCONATE 0.12 % MT SOLN
15.0000 mL | Freq: Two times a day (BID) | OROMUCOSAL | Status: DC
  Administered 2018-12-07 – 2018-12-11 (×9): 15 mL via OROMUCOSAL
  Filled 2018-12-06 (×8): qty 15

## 2018-12-06 MED ORDER — POTASSIUM CHLORIDE 10 MEQ/50ML IV SOLN
10.0000 meq | INTRAVENOUS | Status: AC
  Administered 2018-12-06 (×5): 10 meq via INTRAVENOUS
  Filled 2018-12-06 (×5): qty 50

## 2018-12-06 MED ORDER — ORAL CARE MOUTH RINSE
15.0000 mL | Freq: Two times a day (BID) | OROMUCOSAL | Status: DC
  Administered 2018-12-07 (×2): 15 mL via OROMUCOSAL

## 2018-12-06 NOTE — Progress Notes (Signed)
Patient ID: Curtis [name], male   DOB: 06-Jul-1950, 68 y.o.   MRN: [id number]    Advanced Heart Failure Rounding Note   Subjective:    Impella removed + evacuation of hemothorax 10/2.   Extubated and Swan removed 10/3.   He remains on milrinone 0.375, epinephrine 2, now off norepinephrine.  On amiodarone 30 gtt with paroxysmal atrial fibrillation.  He is on Lasix 60 mg IV bid, good diuresis yesterday.   CVP not set up.   This morning, reports being comfortable and denies significant dyspnea at rest.    WBCs 25 => 18, Tm 99.9.  He is not on abx.    Objective:   Weight Range:  Vital Signs:   Temp:  [98.7 F (37.1 C)-99.9 F (37.7 C)] 98.7 F (37.1 C) (10/04 0300) Pulse Rate:  [98-119] 105 (10/04 0400) Resp:  [21-64] 27 (10/04 0400) BP: (89-196)/(49-73) 114/68 (10/04 0400) SpO2:  [91 %-100 %] 96 % (10/04 0400) Arterial Line BP: (93-162)/(44-67) 101/50 (10/04 0400) FiO2 (%):  [40 %] 40 % (10/03 1522) Last BM Date: 12/04/18  Weight change: Filed Weights   12/02/18 1300 12/03/18 0600 12/04/18 0600  Weight: 100.1 kg 94.3 kg 92.2 kg    Intake/Output:   Intake/Output Summary (Last 24 hours) at 12/06/2018 0535 Last data filed at 12/06/2018 0500 Gross per 24 hour  Intake 2011.67 ml  Output 4713 ml  Net -2701.33 ml     Physical Exam: General: NAD Neck: JVP difficult, no thyromegaly or thyroid nodule.  Lungs: Bilateral rhonchi.  CV: Nondisplaced PMI.  Heart mildly tachy, regular S1/S2, no S3/S4, no murmur.  1+ left ankle edema.   Abdomen: Soft, nontender, no hepatosplenomegaly, no distention.  Skin: Intact without lesions or rashes.  Neurologic: Alert and oriented x 3.  Psych: Normal affect. Extremities: No clubbing or cyanosis.  HEENT: Normal.   Telemetry: sinus tachy 100s bpm Personally reviewed  Labs: Basic Metabolic Panel: Recent Labs  Lab 12/01/18 0127  12/01/18 1545  12/02/18 1457  12/03/18 0416  12/04/18 0428  12/04/18 1143  12/04/18 1642  12/04/18 1646  12/05/18 0401 12/05/18 0420 12/05/18 1549 12/05/18 1829 12/06/18 0428  NA 139   < > 141   < > 138   < > 134*   < > 134*   < > 133*   < > 132* 134*   < > 132* 134* 133* 133* 133*  K 4.1   < > 3.5   < > 3.6   < > 3.7   < > 2.9*   < > 3.7   < > 3.5 3.5   < > 3.8 3.9 4.7 4.2 3.2*  CL 108  --  106  --  104  --  103  --  98  --   --   --  101  --   --  104  --   --   --  97*  CO2 23  --  25  --  24  --  24  --  25  --   --   --  23  --   --  19*  --   --   --  26  GLUCOSE 122*  --  102*  --  125*  --  153*  --  125*  --  135*  --  117* 116*  --  131*  --   --   --  140*  BUN 24*  --  22  --  18  --  19  --  21  --   --   --  18  --   --  17  --   --   --  17  CREATININE 1.34*  --  1.36*  --  1.38*  --  1.24  --  1.27*  --   --   --  1.11  --   --  1.23  --   --   --  1.16  CALCIUM 8.1*  --  7.5*  --  7.7*  --  7.5*  --  7.5*  --   --   --  7.5*  --   --  7.2*  --   --   --  7.6*  MG 2.6*  --  2.0  --  1.7  --  2.0  --   --   --   --   --   --   --   --   --   --   --   --  1.9  PHOS  --   --   --   --   --   --  3.6  --   --   --   --   --   --   --   --   --   --   --   --   --    < > = values in this interval not displayed.    Liver Function Tests: Recent Labs  Lab 11/30/18 0500 12/06/18 0428  AST 30 19  ALT 37 21  ALKPHOS 78 58  BILITOT 2.3* 3.4*  PROT 6.9 5.0*  ALBUMIN 3.9 2.4*   No results for input(s): LIPASE, AMYLASE in the last 168 hours. No results for input(s): AMMONIA in the last 168 hours.  CBC: Recent Labs  Lab 12/03/18 0416  12/04/18 0433  12/04/18 1642  12/05/18 0401 12/05/18 0420 12/05/18 1549 12/05/18 1829 12/06/18 0428  WBC 15.7*  --  28.8*  --  24.5*  --  24.7*  --   --   --  18.4*  NEUTROABS 12.8*  --   --   --  18.9*  --   --   --   --   --   --   HGB 8.7*   < > 8.5*   < > 9.3*   < > 9.7* 9.2* 9.2* 10.5* 9.7*  HCT 24.5*   < > 24.1*   < > 26.6*   < > 28.2* 27.0* 27.0* 31.0* 28.8*  MCV 90.4  --  91.3  --  89.3  --  90.1  --   --   --  91.4   PLT 62*  --  144*  --  121*  --  128*  --   --   --  127*   < > = values in this interval not displayed.    Cardiac Enzymes: No results for input(s): CKTOTAL, CKMB, CKMBINDEX, TROPONINI in the last 168 hours.  BNP: BNP (last 3 results) Recent Labs    11/23/18 2328  BNP 507.9*    ProBNP (last 3 results) No results for input(s): PROBNP in the last 8760 hours.    Other results:  Imaging: Dg Chest 1 View  Result Date: 12/04/2018 CLINICAL DATA:  Open heart surgery EXAM: CHEST  1 VIEW COMPARISON:  12/04/2018, 8:20 a.m. FINDINGS: Interval repositioning of a left-sided chest tube with resolution of a previously seen fluid collection about the left upper lobe. There is  likely a small, layering left pleural effusion. Interval repositioning of right-sided chest tube. Mediastinal drainage tubes remain in position. The right lung is normally aerated. There has been interval endotracheal intubation, tube tip over the mid trachea. Esophagogastric tube with tip and side port below the diaphragm. Right neck pulmonary artery catheter remains with tip projecting over the pulmonary outflow tract. Interval removal of Impella pump. Cardiomegaly status post median sternotomy with aortic valve prosthesis. IMPRESSION: 1. Interval repositioning of a left-sided chest tube with resolution of a previously seen fluid collection about the left upper lobe. There is likely a small, layering left pleural effusion. Interval repositioning of right-sided chest tube. Mediastinal drainage tubes remain in position. The right lung is normally aerated. 2. There has been interval endotracheal intubation, tube tip over the mid trachea. Esophagogastric tube with tip and side port below the diaphragm. Right neck pulmonary artery catheter remains with tip projecting over the pulmonary outflow tract. 3.  Interval removal of Impella pump. 4. Cardiomegaly status post median sternotomy with aortic valve prosthesis. Electronically Signed    By: Eddie Candle M.D.   On: 12/04/2018 16:59   Dg Chest Port 1 View  Result Date: 12/05/2018 CLINICAL DATA:  Removal of the left ventricular assist device. EXAM: PORTABLE CHEST 1 VIEW COMPARISON:  12/04/2018 FINDINGS: Swan-Ganz catheter with the tip projecting over the right ventricular outflow tract. Nasogastric tube projecting over the stomach. Endotracheal tube with the tip 2.4 cm above the carina. Bilateral chest tubes in unchanged position. No pneumothorax. Small left pleural effusion unchanged from the prior exam. Mild left basilar atelectasis. Stable cardiomediastinal silhouette. Prior median sternotomy, CABG, and valve replacement. IMPRESSION: 1. Support lines and tubing in satisfactory unchanged position. 2. Bilateral chest tubes without a pneumothorax. 3. Trace left pleural effusion and left basilar atelectasis. Electronically Signed   By: Kathreen Devoid   On: 12/05/2018 07:24   Dg Chest Port 1 View  Result Date: 12/04/2018 CLINICAL DATA:  Status post coronary bypass grafting EXAM: PORTABLE CHEST 1 VIEW COMPARISON:  12/03/2018 FINDINGS: Cardiac shadow is enlarged. Postsurgical changes are again seen. Impella catheter is noted and stable in appearance. Swan-Ganz catheter is noted in the pulmonary outflow tract. Mediastinal drain and bilateral thoracostomy catheters are seen. No pneumothorax is noted although there is increased density in the left apex laterally when compared with the prior exam. This is consistent with a loculated fluid collection and is significantly larger than that seen on the prior exam now measuring approximately 8.8 by 5.4 cm in greatest dimension. Increase in left-sided pleural effusion is noted as well. No pneumothorax is noted. IMPRESSION: Increasing ovoid opacification in the lateral aspect of left upper hemithorax consistent with a loculated fluid collection. The possibility of focal hematoma deserves consideration. Slight increase in overall left pleural effusion  inferiorly. Tubes and lines as described above. These results will be called to the ordering clinician or representative by the Radiologist Assistant, and communication documented in the PACS or zVision Dashboard. Electronically Signed   By: Inez Catalina M.D.   On: 12/04/2018 08:42     Medications:     Scheduled Medications:  sodium chloride   Intravenous Once   aspirin EC  325 mg Oral Daily   Or   aspirin  324 mg Per Tube Daily   bisacodyl  10 mg Oral Daily   Or   bisacodyl  10 mg Rectal Daily   chlorhexidine gluconate (MEDLINE KIT)  15 mL Mouth Rinse BID   Chlorhexidine Gluconate Cloth  6  each Topical Daily   docusate sodium  200 mg Oral Daily   enoxaparin (LOVENOX) injection  40 mg Subcutaneous Q24H   furosemide  60 mg Intravenous BID   guaiFENesin  600 mg Oral BID   insulin aspart  2-6 Units Subcutaneous Q4H   insulin detemir  5 Units Subcutaneous Q12H   levalbuterol  0.63 mg Nebulization BID   mouth rinse  15 mL Mouth Rinse BID   mouth rinse  15 mL Mouth Rinse 10 times per day   pantoprazole  40 mg Oral Daily   sodium chloride flush  3 mL Intravenous Q12H   spironolactone  12.5 mg Oral Daily    Infusions:  sodium chloride Stopped (12/04/18 1251)   sodium chloride 10 mL/hr at 12/04/18 1800   amiodarone 30 mg/hr (12/06/18 0500)   dexmedetomidine (PRECEDEX) IV infusion Stopped (12/05/18 1621)   epinephrine 2 mcg/min (12/06/18 0500)   lactated ringers     lactated ringers 10 mL/hr at 12/06/18 0500   milrinone 0.375 mcg/kg/min (12/06/18 0500)   nitroGLYCERIN Stopped (12/03/18 2300)   norepinephrine (LEVOPHED) Adult infusion Stopped (12/06/18 0331)   phenylephrine (NEO-SYNEPHRINE) Adult infusion Stopped (12/05/18 0748)   potassium chloride     vasopressin (PITRESSIN) infusion - *FOR SHOCK* Stopped (11/30/18 2331)    PRN Medications: sodium chloride, hydrALAZINE, metoprolol tartrate, morphine injection, ondansetron (ZOFRAN) IV, oxyCODONE,  traMADol   Assessment/Plan:   1. Post-cardiotomy cardiogenic shock in setting if severe iCM and post-operative bleeding/hemorrhagic shock - pre-op echo EF 25-30% - s/p CABG & bioprosthetic MVR with Impella placement on 9/28 c/b severe coagulopathy/bleeding requiring OR takeback  - On 10/2 Impella removal and evacuation of left hemothorax - This morning, he is on epinephrine 2, milrinone 0.375.  Off norepinephrine.  Co-ox 63% with stable BP.  Today, will work on weaning off epinephrine.  - Good UOP yesterday with I/Os negative.  CVP not set up this morning (Swan removed) but lungs with rhonchi and will continue Lasix 60 mg IV bid today.  Set up CVP.   2. Acute hypoxemic respiratory failure - Extubated 9/30 - Evacuation hemothorax in OR 10/2 and re-intubated.  - Extubated 10/3.  Continue IV Lasix.  CXR today.   3. Severe MR - s/p bioprosthetic MVR. Stable   4. Acute blood loss anemia - transfuse to keep hgb >= 8.0 - hgb 9.7  5. CAD with NSTEMI - s/p CABG 9/28 - on ASA/statin - b-blocker and Plavix when ready  6. Thrombocytopenia - PLTs 68k -> 62K -> 144k -> 121 -> 128 -> 127.  - no bleeding   7. PAF - back in NSR with amio gtt, continue gtt while on inotropes/pressors. - ECG today, think sinus tachy.    8. DVT prophylaxis - Lovenox.   CRITICAL CARE Performed by: Loralie Champagne  Total critical care time: 40 minutes  Critical care time was exclusive of separately billable procedures and treating other patients.  Critical care was necessary to treat or prevent imminent or life-threatening deterioration.  Critical care was time spent personally by me (independent of midlevel providers or residents) on the following activities: development of treatment plan with patient and/or surrogate as well as nursing, discussions with consultants, evaluation of patient's response to treatment, examination of patient, obtaining history from patient or surrogate, ordering and  performing treatments and interventions, ordering and review of laboratory studies, ordering and review of radiographic studies, pulse oximetry and re-evaluation of patient's condition.   Length of Stay: 12   Loralie Champagne MD  12/06/2018, 5:35 AM  Advanced Heart Failure Team Pager [phone] (M-F; [place])  Please contact [facility] for night-coverage after hours (4p -7a ) and weekends on [email or web address]

## 2018-12-06 NOTE — Progress Notes (Signed)
Patient ID: Curtis [name], male   DOB: 1950-07-31, 68 y.o.   MRN: [id number] [facility] DAILY ICU PROGRESS NOTE                   [address]            [place] [address]          [phone]   2 Days Post-Op Procedure(s) (LRB): REMOVAL OF IMPELLA LEFT VENTRICULAR ASSIST DEVICE.  EVACUATION OF LEFT HEMOTHORAX (N/A) TRANSESOPHAGEAL ECHOCARDIOGRAM (TEE) (N/A) Sternal Plating with Sternal Rewiring  11/30/18 Coronary Artery Bypass Grafting x 3    Left Internal Mammary Artery to Distal Left Anterior Descending Coronary Artery; Saphenous Vein Graft to  Obtuse Marginal Branch of Left Circumflex Coronary Artery; Sapheonous Vein Graft to Diagonal Branch Coronary Artery, Endoscopic Vein Harvest from left Thigh and Lower Leg Mitral valve replacement (27 mm CE Magna Ease bovine bioprosthetic); insertion of Impella LVAD; completion graft surveillance with indocyanine green fluorescence imaging (SPY)  Total Length of Stay:  LOS: 12 days   Subjective: Patient up in chair, respiratory status improved from yesterday  Objective: Vital signs in last 24 hours: Temp:  [98.7 F (37.1 C)-99.9 F (37.7 C)] 98.7 F (37.1 C) (10/04 0300) Pulse Rate:  [102-119] 107 (10/04 0807) Cardiac Rhythm: Sinus tachycardia (10/04 0400) Resp:  [21-64] 31 (10/04 0807) BP: (89-131)/(49-73) 107/53 (10/04 0807) SpO2:  [91 %-99 %] 97 % (10/04 0807) Arterial Line BP: (93-162)/(44-67) 116/46 (10/04 0700) FiO2 (%):  [40 %] 40 % (10/03 1522) Weight:  [92.1 kg] 92.1 kg (10/04 0500)  Filed Weights   12/03/18 0600 12/04/18 0600 12/06/18 0500  Weight: 94.3 kg 92.2 kg 92.1 kg    Weight change:    Hemodynamic parameters for last 24 hours: PAP: (21-37)/(10-22) 31/10 CVP:  [7 mmHg-11 mmHg] 10 mmHg CO:  [4.9 L/min-5 L/min] 5 L/min CI:  [2.4 L/min/m2-2.5 L/min/m2] 2.5 L/min/m2  Intake/Output from previous day: 10/03 0701 - 10/04 0700 In: 1847.3 [P.O.:60; I.V.:1394; NG/GT:220; IV Piggyback:173.3] Out: 7893  [YBOFB:5102; Emesis/NG output:130; Chest Tube:400]  Intake/Output this shift: No intake/output data recorded.  Current Meds: Scheduled Meds: . sodium chloride   Intravenous Once  . aspirin EC  325 mg Oral Daily   Or  . aspirin  324 mg Per Tube Daily  . bisacodyl  10 mg Oral Daily   Or  . bisacodyl  10 mg Rectal Daily  . chlorhexidine gluconate (MEDLINE KIT)  15 mL Mouth Rinse BID  . Chlorhexidine Gluconate Cloth  6 each Topical Daily  . docusate sodium  200 mg Oral Daily  . enoxaparin (LOVENOX) injection  40 mg Subcutaneous Q24H  . furosemide  60 mg Intravenous BID  . guaiFENesin  600 mg Oral BID  . insulin aspart  2-6 Units Subcutaneous Q4H  . insulin detemir  5 Units Subcutaneous Q12H  . levalbuterol  0.63 mg Nebulization BID  . mouth rinse  15 mL Mouth Rinse BID  . mouth rinse  15 mL Mouth Rinse 10 times per day  . pantoprazole  40 mg Oral Daily  . sodium chloride flush  3 mL Intravenous Q12H  . spironolactone  12.5 mg Oral Daily   Continuous Infusions: . sodium chloride Stopped (12/04/18 1251)  . sodium chloride 10 mL/hr at 12/04/18 1800  . amiodarone 30 mg/hr (12/06/18 0600)  . dexmedetomidine (PRECEDEX) IV infusion Stopped (12/05/18 1621)  . epinephrine 1.5 mcg/min (12/06/18 0600)  . lactated ringers    . lactated ringers 10 mL/hr at  12/06/18 0600  . milrinone 0.375 mcg/kg/min (12/06/18 0600)  . nitroGLYCERIN Stopped (12/03/18 2300)  . norepinephrine (LEVOPHED) Adult infusion Stopped (12/06/18 0331)  . phenylephrine (NEO-SYNEPHRINE) Adult infusion Stopped (12/05/18 0748)  . potassium chloride 10 mEq (12/06/18 0731)  . vasopressin (PITRESSIN) infusion - *FOR SHOCK* Stopped (11/30/18 2331)   PRN Meds:.sodium chloride, hydrALAZINE, metoprolol tartrate, morphine injection, ondansetron (ZOFRAN) IV, oxyCODONE, traMADol  General appearance: alert, cooperative and no distress Neurologic: intact Heart: regular rate and rhythm, S1, S2 normal, no murmur, click, rub or  gallop Lungs: diminished breath sounds bibasilar Abdomen: soft, non-tender; bowel sounds normal; no masses,  no organomegaly Extremities: extremities normal, atraumatic, no cyanosis or edema and Homans sign is negative, no sign of DVT Wound: Sternal dressing intact  Lab Results: CBC: Recent Labs    12/05/18 0401  12/05/18 1829 12/06/18 0428  WBC 24.7*  --   --  18.4*  HGB 9.7*   < > 10.5* 9.7*  HCT 28.2*   < > 31.0* 28.8*  PLT 128*  --   --  127*   < > = values in this interval not displayed.   BMET:  Recent Labs    12/05/18 0401  12/05/18 1829 12/06/18 0428  NA 132*   < > 133* 133*  K 3.8   < > 4.2 3.2*  CL 104  --   --  97*  CO2 19*  --   --  26  GLUCOSE 131*  --   --  140*  BUN 17  --   --  17  CREATININE 1.23  --   --  1.16  CALCIUM 7.2*  --   --  7.6*   < > = values in this interval not displayed.    CMET: Lab Results  Component Value Date   WBC 18.4 (H) 12/06/2018   HGB 9.7 (L) 12/06/2018   HCT 28.8 (L) 12/06/2018   PLT 127 (L) 12/06/2018   GLUCOSE 140 (H) 12/06/2018   CHOL 233 (H) 11/25/2018   TRIG 155 (H) 11/25/2018   HDL 31 (L) 11/25/2018   LDLCALC 171 (H) 11/25/2018   ALT 21 12/06/2018   AST 19 12/06/2018   NA 133 (L) 12/06/2018   K 3.2 (L) 12/06/2018   CL 97 (L) 12/06/2018   CREATININE 1.16 12/06/2018   BUN 17 12/06/2018   CO2 26 12/06/2018   TSH 0.350 11/24/2018   INR 1.5 (H) 11/30/2018   HGBA1C 5.8 (H) 11/24/2018    COX 62  PT/INR: No results for input(s): LABPROT, INR in the last 72 hours. Radiology: No results found.   Assessment/Plan: S/P Procedure(s) (LRB): REMOVAL OF IMPELLA LEFT VENTRICULAR ASSIST DEVICE.  EVACUATION OF LEFT HEMOTHORAX (N/A) TRANSESOPHAGEAL ECHOCARDIOGRAM (TEE) (N/A) Sternal Plating with Sternal Rewiring Mobilize Diuresis Diabetes control d/c tubes/lines-discontinue mediastinal tube Renal function stable Replace potassium Wean epinephrine as tolerated continue milrinone   Curtis [name] 12/06/2018  8:27 AM

## 2018-12-06 NOTE — Progress Notes (Addendum)
Patient ID: Curtis [name], male   DOB: 1951-02-15, 68 y.o.   MRN: [id number] EVENING ROUNDS NOTE :     [address]       [place] [address]             [phone]                 2 Days Post-Op Procedure(s) (LRB): REMOVAL OF IMPELLA LEFT VENTRICULAR ASSIST DEVICE.  EVACUATION OF LEFT HEMOTHORAX (N/A) TRANSESOPHAGEAL ECHOCARDIOGRAM (TEE) (N/A) Sternal Plating with Sternal Rewiring  Total Length of Stay:  LOS: 12 days  BP (!) 89/61   Pulse (!) 110   Temp 98.1 F (36.7 C) (Oral)   Resp (!) 36   Ht 5\' 9"  (1.753 m)   Wt 92.1 kg   SpO2 95%   BMI 29.98 kg/m   .Intake/Output      10/04 0701 - 10/05 0700   P.O. 120   I.V. (mL/kg) 473.9 (5.1)   NG/GT    IV Piggyback 201.2   Total Intake(mL/kg) 795.1 (8.6)   Urine (mL/kg/hr) 1450 (1.3)   Emesis/NG output    Chest Tube 270   Total Output 1720   Net -924.9         . sodium chloride Stopped (12/04/18 1251)  . sodium chloride 10 mL/hr at 12/04/18 1800  . amiodarone 30 mg/hr (12/06/18 1900)  . dexmedetomidine (PRECEDEX) IV infusion Stopped (12/05/18 1621)  . epinephrine 2 mcg/min (12/06/18 1900)  . lactated ringers    . lactated ringers 10 mL/hr at 12/06/18 1900  . milrinone 0.375 mcg/kg/min (12/06/18 1900)  . nitroGLYCERIN Stopped (12/03/18 2300)  . norepinephrine (LEVOPHED) Adult infusion Stopped (12/06/18 0331)  . phenylephrine (NEO-SYNEPHRINE) Adult infusion Stopped (12/05/18 0748)     Lab Results  Component Value Date   WBC 18.4 (H) 12/06/2018   HGB 9.7 (L) 12/06/2018   HCT 28.8 (L) 12/06/2018   PLT 127 (L) 12/06/2018   GLUCOSE 140 (H) 12/06/2018   CHOL 233 (H) 11/25/2018   TRIG 155 (H) 11/25/2018   HDL 31 (L) 11/25/2018   LDLCALC 171 (H) 11/25/2018   ALT 21 12/06/2018   AST 19 12/06/2018   NA 133 (L) 12/06/2018   K 3.2 (L) 12/06/2018   CL 97 (L) 12/06/2018   CREATININE 1.16 12/06/2018   BUN 17 12/06/2018   CO2 26 12/06/2018   TSH 0.350 11/24/2018   INR 1.5 (H) 11/30/2018   HGBA1C 5.8  (H) 11/24/2018   Mildly confused  bp stable Responds to lasix  Checking abg   [name] MD  Beeper [phone] Office [phone] 12/06/2018 7:16 PM

## 2018-12-07 ENCOUNTER — Inpatient Hospital Stay (HOSPITAL_COMMUNITY): Payer: Medicare Other

## 2018-12-07 ENCOUNTER — Encounter (HOSPITAL_COMMUNITY): Payer: Self-pay | Admitting: Cardiothoracic Surgery

## 2018-12-07 LAB — POCT I-STAT 7, (LYTES, BLD GAS, ICA,H+H)
Acid-Base Excess: 4 mmol/L — ABNORMAL HIGH (ref 0.0–2.0)
Acid-Base Excess: 2 mmol/L (ref 0.0–2.0)
Bicarbonate: 24.2 mmol/L (ref 20.0–28.0)
Bicarbonate: 27.7 mmol/L (ref 20.0–28.0)
Bicarbonate: 26.9 mmol/L (ref 20.0–28.0)
Calcium, Ion: 1.15 mmol/L (ref 1.15–1.40)
Calcium, Ion: 1.09 mmol/L — ABNORMAL LOW (ref 1.15–1.40)
Calcium, Ion: 1.1 mmol/L — ABNORMAL LOW (ref 1.15–1.40)
HCT: 22 % — ABNORMAL LOW (ref 39.0–52.0)
HCT: 30 % — ABNORMAL LOW (ref 39.0–52.0)
HCT: 29 % — ABNORMAL LOW (ref 39.0–52.0)
Hemoglobin: 7.5 g/dL — ABNORMAL LOW (ref 13.0–17.0)
Hemoglobin: 10.2 g/dL — ABNORMAL LOW (ref 13.0–17.0)
Hemoglobin: 9.9 g/dL — ABNORMAL LOW (ref 13.0–17.0)
O2 Saturation: 98 %
O2 Saturation: 97 %
O2 Saturation: 98 %
Patient temperature: 98
Potassium: 3.9 mmol/L (ref 3.5–5.1)
Potassium: 3.5 mmol/L (ref 3.5–5.1)
Potassium: 3.6 mmol/L (ref 3.5–5.1)
Sodium: 134 mmol/L — ABNORMAL LOW (ref 135–145)
Sodium: 134 mmol/L — ABNORMAL LOW (ref 135–145)
Sodium: 133 mmol/L — ABNORMAL LOW (ref 135–145)
TCO2: 25 mmol/L (ref 22–32)
TCO2: 29 mmol/L (ref 22–32)
TCO2: 28 mmol/L (ref 22–32)
pCO2 arterial: 38.6 mmHg (ref 32.0–48.0)
pCO2 arterial: 35.5 mmHg (ref 32.0–48.0)
pCO2 arterial: 40.2 mmHg (ref 32.0–48.0)
pH, Arterial: 7.406 (ref 7.350–7.450)
pH, Arterial: 7.499 — ABNORMAL HIGH (ref 7.350–7.450)
pH, Arterial: 7.433 (ref 7.350–7.450)
pO2, Arterial: 108 mmHg (ref 83.0–108.0)
pO2, Arterial: 83 mmHg (ref 83.0–108.0)
pO2, Arterial: 94 mmHg (ref 83.0–108.0)

## 2018-12-07 LAB — CBC
HCT: 29.5 % — ABNORMAL LOW (ref 39.0–52.0)
Hemoglobin: 9.8 g/dL — ABNORMAL LOW (ref 13.0–17.0)
MCH: 31 pg (ref 26.0–34.0)
MCHC: 33.2 g/dL (ref 30.0–36.0)
MCV: 93.4 fL (ref 80.0–100.0)
Platelets: 182 10*3/uL (ref 150–400)
RBC: 3.16 MIL/uL — ABNORMAL LOW (ref 4.22–5.81)
RDW: 19.5 % — ABNORMAL HIGH (ref 11.5–15.5)
WBC: 26.9 10*3/uL — ABNORMAL HIGH (ref 4.0–10.5)
nRBC: 0.2 % (ref 0.0–0.2)

## 2018-12-07 LAB — URINALYSIS, ROUTINE W REFLEX MICROSCOPIC
Bilirubin Urine: NEGATIVE
Glucose, UA: NEGATIVE mg/dL
Ketones, ur: NEGATIVE mg/dL
Leukocytes,Ua: NEGATIVE
Nitrite: NEGATIVE
Protein, ur: NEGATIVE mg/dL
Specific Gravity, Urine: 1.014 (ref 1.005–1.030)
pH: 8 (ref 5.0–8.0)

## 2018-12-07 LAB — COMPREHENSIVE METABOLIC PANEL
ALT: 19 U/L (ref 0–44)
AST: 18 U/L (ref 15–41)
Albumin: 2.3 g/dL — ABNORMAL LOW (ref 3.5–5.0)
Alkaline Phosphatase: 63 U/L (ref 38–126)
Anion gap: 12 (ref 5–15)
BUN: 18 mg/dL (ref 8–23)
CO2: 27 mmol/L (ref 22–32)
Calcium: 7.6 mg/dL — ABNORMAL LOW (ref 8.9–10.3)
Chloride: 95 mmol/L — ABNORMAL LOW (ref 98–111)
Creatinine, Ser: 1.2 mg/dL (ref 0.61–1.24)
GFR calc Af Amer: >60 mL/min (ref >60)
GFR calc non Af Amer: >60 mL/min (ref >60)
Glucose, Bld: 162 mg/dL — ABNORMAL HIGH (ref 70–99)
Potassium: 3.4 mmol/L — ABNORMAL LOW (ref 3.5–5.1)
Sodium: 134 mmol/L — ABNORMAL LOW (ref 135–145)
Total Bilirubin: 2.6 mg/dL — ABNORMAL HIGH (ref 0.3–1.2)
Total Protein: 5 g/dL — ABNORMAL LOW (ref 6.5–8.1)

## 2018-12-07 LAB — COOXEMETRY PANEL
Carboxyhemoglobin: 1.2 % (ref 0.5–1.5)
Methemoglobin: 0.8 % (ref 0.0–1.5)
O2 Saturation: 66.1 %
Total hemoglobin: 9.9 g/dL — ABNORMAL LOW (ref 12.0–16.0)

## 2018-12-07 LAB — POCT I-STAT, CHEM 8
BUN: 20 mg/dL (ref 8–23)
Calcium, Ion: 1.31 mmol/L (ref 1.15–1.40)
Chloride: 96 mmol/L — ABNORMAL LOW (ref 98–111)
Creatinine, Ser: 1.1 mg/dL (ref 0.61–1.24)
Glucose, Bld: 123 mg/dL — ABNORMAL HIGH (ref 70–99)
HCT: 21 % — ABNORMAL LOW (ref 39.0–52.0)
Hemoglobin: 7.1 g/dL — ABNORMAL LOW (ref 13.0–17.0)
Potassium: 3.7 mmol/L (ref 3.5–5.1)
Sodium: 133 mmol/L — ABNORMAL LOW (ref 135–145)
TCO2: 23 mmol/L (ref 22–32)

## 2018-12-07 LAB — PROCALCITONIN: Procalcitonin: 0.45 ng/mL

## 2018-12-07 LAB — GLUCOSE, CAPILLARY
Glucose-Capillary: 113 mg/dL — ABNORMAL HIGH (ref 70–99)
Glucose-Capillary: 123 mg/dL — ABNORMAL HIGH (ref 70–99)
Glucose-Capillary: 129 mg/dL — ABNORMAL HIGH (ref 70–99)
Glucose-Capillary: 114 mg/dL — ABNORMAL HIGH (ref 70–99)
Glucose-Capillary: 117 mg/dL — ABNORMAL HIGH (ref 70–99)

## 2018-12-07 LAB — MAGNESIUM: Magnesium: 1.9 mg/dL (ref 1.7–2.4)

## 2018-12-07 MED ORDER — VANCOMYCIN HCL 10 G IV SOLR
1750.0000 mg | INTRAVENOUS | Status: AC
  Administered 2018-12-08 – 2018-12-11 (×4): 1750 mg via INTRAVENOUS
  Filled 2018-12-07 (×4): qty 1750

## 2018-12-07 MED ORDER — EPINEPHRINE HCL 5 MG/250ML IV SOLN IN NS
0.0000 ug/min | INTRAVENOUS | Status: DC
  Administered 2018-12-07 (×2): 2 ug/min via INTRAVENOUS
  Filled 2018-12-07: qty 250

## 2018-12-07 MED ORDER — POTASSIUM CHLORIDE 10 MEQ/50ML IV SOLN
10.0000 meq | INTRAVENOUS | Status: AC
  Administered 2018-12-07 (×3): 10 meq via INTRAVENOUS
  Filled 2018-12-07 (×3): qty 50

## 2018-12-07 MED ORDER — IOHEXOL 350 MG/ML SOLN
100.0000 mL | Freq: Once | INTRAVENOUS | Status: AC | PRN
  Administered 2018-12-07: 100 mL via INTRAVENOUS

## 2018-12-07 MED ORDER — SODIUM CHLORIDE 0.9 % IV SOLN
INTRAVENOUS | Status: DC | PRN
  Administered 2018-12-07: 13:00:00 1000 mL via INTRAVENOUS

## 2018-12-07 MED ORDER — SODIUM CHLORIDE 0.9 % IV SOLN
2.0000 g | Freq: Three times a day (TID) | INTRAVENOUS | Status: DC
  Administered 2018-12-07 – 2018-12-11 (×13): 2 g via INTRAVENOUS
  Filled 2018-12-07 (×16): qty 2

## 2018-12-07 MED ORDER — VANCOMYCIN HCL 10 G IV SOLR
2000.0000 mg | Freq: Once | INTRAVENOUS | Status: AC
  Administered 2018-12-07: 2000 mg via INTRAVENOUS
  Filled 2018-12-07: qty 2000

## 2018-12-07 MED ORDER — ACETAZOLAMIDE SODIUM 500 MG IJ SOLR
500.0000 mg | Freq: Four times a day (QID) | INTRAMUSCULAR | Status: AC
  Administered 2018-12-07 (×3): 500 mg via INTRAVENOUS
  Filled 2018-12-07 (×3): qty 500

## 2018-12-07 NOTE — Progress Notes (Signed)
Patient ID: Curtis [name], male   DOB: 05-Jun-1950, 68 y.o.   MRN: [id number]    Advanced Heart Failure Rounding Note   Subjective:    Impella removed + evacuation of hemothorax 10/2.  Extubated and Swan removed 10/3.   He remains on milrinone 0.375. Co-ox 66% Off epinephrine and NE. On amio at 30/hr  Getting lasix 60IV bid and IV acetazolamide Weight down 4 pounds to 199 (pre-op 184)  This am developed hypoxia and worsening leukocytosis. Place on BIPAP.  CT chest/AB done emergently. No acute findings. Afebrile. ABG with respiratory alkalosis. Started on acetazolamide.   CVP 7-8  Creatinine 1.2, K 3.4  WBCs 25 => 18 => 26.9 , Tm 98.4.  He is not on abx.    Objective:   Weight Range:  Vital Signs:   Temp:  [97.5 F (36.4 C)-98.4 F (36.9 C)] 98.4 F (36.9 C) (10/05 0801) Pulse Rate:  [52-116] 95 (10/05 0700) Resp:  [20-40] 24 (10/05 0700) BP: (89-121)/(53-95) 101/58 (10/05 0700) SpO2:  [90 %-100 %] 100 % (10/05 0700) Arterial Line BP: (51-129)/(45-73) 62/48 (10/05 0700) FiO2 (%):  [50 %] 50 % (10/05 0400) Weight:  [90.5 kg] 90.5 kg (10/05 0500) Last BM Date: 12/04/18  Weight change: Filed Weights   12/04/18 0600 12/06/18 0500 12/07/18 0500  Weight: 92.2 kg 92.1 kg 90.5 kg    Intake/Output:   Intake/Output Summary (Last 24 hours) at 12/07/2018 0912 Last data filed at 12/07/2018 0700 Gross per 24 hour  Intake 1118.03 ml  Output 2795 ml  Net -1676.97 ml     Physical Exam: General:  Ill appearing. On bipap HEENT: normal Neck: supple. JVP 7-8. Carotids 2+ bilat; no bruits. No lymphadenopathy or thryomegaly appreciated. Cor: PMI nondisplaced. Regular rate & rhythm. No rubs, gallops or murmurs. Sternal wound ok Lungs: poor air movement shallow breathing Abdomen: soft, nontender, + distended. No hepatosplenomegaly. No bruits or masses. Good bowel sounds. Extremities: no cyanosis, clubbing, rash, edema Neuro: alert & oriented follows commands. moves all 4     Telemetry: sinus 90-100 bpm Personally reviewed  Labs: Basic Metabolic Panel: Recent Labs  Lab 12/01/18 1545  12/02/18 1457  12/03/18 0416  12/04/18 0428  12/04/18 1642 12/04/18 1646  12/05/18 0401  12/05/18 1829 12/06/18 0428 12/06/18 1949 12/06/18 2135 12/07/18 0428  NA 141   < > 138   < > 134*   < > 134*   < > 132* 134*   < > 132*   < > 133* 133* 133* 133* 134*  K 3.5   < > 3.6   < > 3.7   < > 2.9*   < > 3.5 3.5   < > 3.8   < > 4.2 3.2* 3.7 3.7 3.4*  CL 106  --  104  --  103  --  98  --  101  --   --  104  --   --  97*  --   --  95*  CO2 25  --  24  --  24  --  25  --  23  --   --  19*  --   --  26  --   --  27  GLUCOSE 102*  --  125*  --  153*  --  125*   < > 117* 116*  --  131*  --   --  140*  --   --  162*  BUN 22  --  18  --  19  --  21  --  18  --   --  17  --   --  17  --   --  18  CREATININE 1.36*  --  1.38*  --  1.24  --  1.27*  --  1.11  --   --  1.23  --   --  1.16  --   --  1.20  CALCIUM 7.5*  --  7.7*  --  7.5*  --  7.5*  --  7.5*  --   --  7.2*  --   --  7.6*  --   --  7.6*  MG 2.0  --  1.7  --  2.0  --   --   --   --   --   --   --   --   --  1.9  --   --  1.9  PHOS  --   --   --   --  3.6  --   --   --   --   --   --   --   --   --   --   --   --   --    < > = values in this interval not displayed.    Liver Function Tests: Recent Labs  Lab 12/06/18 0428 12/07/18 0428  AST 19 18  ALT 21 19  ALKPHOS 58 63  BILITOT 3.4* 2.6*  PROT 5.0* 5.0*  ALBUMIN 2.4* 2.3*   No results for input(s): LIPASE, AMYLASE in the last 168 hours. No results for input(s): AMMONIA in the last 168 hours.  CBC: Recent Labs  Lab 12/03/18 0416  12/04/18 0433  12/04/18 1642  12/05/18 0401  12/05/18 1829 12/06/18 0428 12/06/18 1949 12/06/18 2135 12/07/18 0428  WBC 15.7*  --  28.8*  --  24.5*  --  24.7*  --   --  18.4*  --   --  26.9*  NEUTROABS 12.8*  --   --   --  18.9*  --   --   --   --   --   --   --   --   HGB 8.7*   < > 8.5*   < > 9.3*   < > 9.7*   < > 10.5* 9.7*  10.9* 12.6* 9.8*  HCT 24.5*   < > 24.1*   < > 26.6*   < > 28.2*   < > 31.0* 28.8* 32.0* 37.0* 29.5*  MCV 90.4  --  91.3  --  89.3  --  90.1  --   --  91.4  --   --  93.4  PLT 62*  --  144*  --  121*  --  128*  --   --  127*  --   --  182   < > = values in this interval not displayed.    Cardiac Enzymes: No results for input(s): CKTOTAL, CKMB, CKMBINDEX, TROPONINI in the last 168 hours.  BNP: BNP (last 3 results) Recent Labs    11/23/18 2328  BNP 507.9*    ProBNP (last 3 results) No results for input(s): PROBNP in the last 8760 hours.    Other results:  Imaging: Ct Head Wo Contrast  Result Date: 12/07/2018 CLINICAL DATA:  Altered level of consciousness. EXAM: CT HEAD WITHOUT CONTRAST TECHNIQUE: Contiguous axial images were obtained from the base of the skull through the vertex without intravenous contrast. COMPARISON:  None. FINDINGS: Brain: No evidence of  acute infarction, hemorrhage, extra-axial collection, ventriculomegaly, or mass effect. Small left basal ganglia lacunar infarct. Generalized cerebral atrophy. Periventricular white matter low attenuation likely secondary to microangiopathy. Vascular: Cerebrovascular atherosclerotic calcifications are noted. Skull: Negative for fracture or focal lesion. Sinuses/Orbits: Visualized portions of the orbits are unremarkable. Mastoid sinuses are clear. Mucous retention cyst in bilateral maxillary sinuses. Other: None. IMPRESSION: 1. No acute intracranial pathology. 2. Chronic microvascular disease and cerebral atrophy. Electronically Signed   By: Elige Ko   On: 12/07/2018 09:06   Dg Chest Port 1 View  Result Date: 12/07/2018 CLINICAL DATA:  Status post cardiac surgery EXAM: PORTABLE CHEST 1 VIEW COMPARISON:  12/06/2018 FINDINGS: Cardiac shadow is mildly enlarged but stable. Postsurgical changes are again seen. Right jugular sheath is again noted and stable. Bilateral thoracostomy catheters and mediastinal drain are seen. No  pneumothorax is noted. Improved aeration is noted in the left base. IMPRESSION: Tubes and lines as described above. No focal infiltrate is noted. Electronically Signed   By: Alcide Clever M.D.   On: 12/07/2018 07:45   Dg Chest Port 1 View  Result Date: 12/06/2018 CLINICAL DATA:  Chest tube in place. EXAM: PORTABLE CHEST 1 VIEW COMPARISON:  12/05/2018 FINDINGS: Right IJ central venous sheath remains in place with tip over the SVC. Bilateral basilar chest tubes as well as mediastinal drain unchanged. Lungs are adequately inflated with stable left base opacification likely small effusion with associated atelectasis. Minimal linear atelectasis right base. No pneumothorax. Mild stable cardiomegaly. Remainder of the exam is unchanged. IMPRESSION: Stable small left effusion with associated basilar atelectasis. Minimal linear atelectasis right base. Tubes and lines as described. Electronically Signed   By: Elberta Fortis M.D.   On: 12/06/2018 08:26     Medications:     Scheduled Medications: . sodium chloride   Intravenous Once  . acetaZOLAMIDE  500 mg Intravenous Q6H  . aspirin EC  325 mg Oral Daily   Or  . aspirin  324 mg Per Tube Daily  . bisacodyl  10 mg Oral Daily   Or  . bisacodyl  10 mg Rectal Daily  . chlorhexidine  15 mL Mouth Rinse BID  . Chlorhexidine Gluconate Cloth  6 each Topical Daily  . docusate sodium  200 mg Oral Daily  . enoxaparin (LOVENOX) injection  40 mg Subcutaneous Q24H  . furosemide  60 mg Intravenous BID  . guaiFENesin  600 mg Oral BID  . insulin aspart  2-6 Units Subcutaneous Q4H  . insulin detemir  5 Units Subcutaneous Q12H  . levalbuterol  0.63 mg Nebulization BID  . mouth rinse  15 mL Mouth Rinse q12n4p  . pantoprazole  40 mg Oral Daily  . sodium chloride flush  3 mL Intravenous Q12H  . spironolactone  12.5 mg Oral Daily    Infusions: . sodium chloride Stopped (12/04/18 1251)  . sodium chloride Stopped (12/07/18 1610)  . amiodarone 30 mg/hr (12/07/18 0700)   . dexmedetomidine (PRECEDEX) IV infusion Stopped (12/05/18 1621)  . lactated ringers    . lactated ringers 10 mL/hr at 12/07/18 0500  . milrinone 0.375 mcg/kg/min (12/07/18 0700)  . nitroGLYCERIN Stopped (12/03/18 2300)  . norepinephrine (LEVOPHED) Adult infusion Stopped (12/06/18 0331)  . phenylephrine (NEO-SYNEPHRINE) Adult infusion Stopped (12/05/18 0748)    PRN Medications: sodium chloride, hydrALAZINE, metoprolol tartrate, morphine injection, ondansetron (ZOFRAN) IV, oxyCODONE, traMADol   Assessment/Plan:   1. Post-cardiotomy cardiogenic shock in setting if severe iCM and post-operative bleeding/hemorrhagic shock - pre-op echo EF 25-30% - s/p CABG &  bioprosthetic MVR with Impella placement on 9/28 c/b severe coagulopathy/bleeding requiring OR takeback  - On 10/2 Impella removal and evacuation of left hemothorax - This morning, he is on,milrinone 0.375.  Off norepinephrine and EPI 2.  Co-ox 66% with stable BP.  Cut milrinone to 0.25. Wean epi as tolerated   2. Acute hypoxemic respiratory failure - Extubated 9/30 - Evacuation hemothorax in OR 10/2 and re-intubated.  - Extubated 10/3.   - ABG with respiratory alkalosis. Now on Bipap and acetazolamide - CT negative for PE or significant effusion/infiltrate - Continue diuresis.  - Check PCT  3. Severe MR - s/p bioprosthetic MVR. Stable   4. Acute blood loss anemia - transfuse to keep hgb >= 8.0 - hgb 9.8  5. CAD with NSTEMI - s/p CABG 9/28 - on ASA/statin - no s/s ischemia - b-blocker and Plavix when ready  6. Thrombocytopenia - resolved. PLTs 182k  - no bleeding   7. PAF - back in NSR with amio gtt, continue gtt while on inotropes/pressors.   8. DVT prophylaxis - Lovenox.   9. Leukocytosis - cultures drawn - Check PCT - empiric vanc /cefipime for now  CRITICAL CARE Performed by: Glori Bickers  Total critical care time: 45 minutes  Critical care time was exclusive of separately billable  procedures and treating other patients.  Critical care was necessary to treat or prevent imminent or life-threatening deterioration.  Critical care was time spent personally by me (independent of midlevel providers or residents) on the following activities: development of treatment plan with patient and/or surrogate as well as nursing, discussions with consultants, evaluation of patient's response to treatment, examination of patient, obtaining history from patient or surrogate, ordering and performing treatments and interventions, ordering and review of laboratory studies, ordering and review of radiographic studies, pulse oximetry and re-evaluation of patient's condition.   Length of Stay: 13   Glori Bickers MD 12/07/2018, 9:12 AM  Advanced Heart Failure Team Pager [phone] (M-F; [place])  Please contact [facility] for night-coverage after hours (4p -7a ) and weekends on [email or web address]

## 2018-12-07 NOTE — Progress Notes (Addendum)
[facility] DAILY ICU PROGRESS NOTE                   [address]            [place] [address]          [phone]   3 Days Post-Op Procedure(s) (LRB): REMOVAL OF IMPELLA LEFT VENTRICULAR ASSIST DEVICE.  EVACUATION OF LEFT HEMOTHORAX (N/A) TRANSESOPHAGEAL ECHOCARDIOGRAM (TEE) (N/A) Sternal Plating with Sternal Rewiring  Total Length of Stay:  LOS: 13 days   Subjective:  Patient with respiratory distress this morning on BIPAP.  Denies pain, N/V, responsive to commands  Objective: Vital signs in last 24 hours: Temp:  [97.5 F (36.4 C)-98.2 F (36.8 C)] 98.2 F (36.8 C) (10/05 0356) Pulse Rate:  [52-116] 95 (10/05 0700) Cardiac Rhythm: Sinus tachycardia (10/05 0400) Resp:  [20-40] 24 (10/05 0700) BP: (89-121)/(53-95) 101/58 (10/05 0700) SpO2:  [90 %-100 %] 100 % (10/05 0700) Arterial Line BP: (51-129)/(45-73) 62/48 (10/05 0700) FiO2 (%):  [50 %] 50 % (10/05 0400) Weight:  [90.5 kg] 90.5 kg (10/05 0500)  Filed Weights   12/04/18 0600 12/06/18 0500 12/07/18 0500  Weight: 92.2 kg 92.1 kg 90.5 kg    Weight change: -1.6 kg   Hemodynamic parameters for last 24 hours: CVP:  [3 mmHg-4 mmHg] 3 mmHg  Intake/Output from previous day: 10/04 0701 - 10/05 0700 In: 1381.4 [P.O.:120; I.V.:991.3; IV Piggyback:270.2] Out: 3045 [Urine:2615; Chest Tube:430]  Current Meds: Scheduled Meds: . sodium chloride   Intravenous Once  . acetaZOLAMIDE  500 mg Intravenous Q6H  . aspirin EC  325 mg Oral Daily   Or  . aspirin  324 mg Per Tube Daily  . bisacodyl  10 mg Oral Daily   Or  . bisacodyl  10 mg Rectal Daily  . chlorhexidine  15 mL Mouth Rinse BID  . Chlorhexidine Gluconate Cloth  6 each Topical Daily  . docusate sodium  200 mg Oral Daily  . enoxaparin (LOVENOX) injection  40 mg Subcutaneous Q24H  . furosemide  60 mg Intravenous BID  . guaiFENesin  600 mg Oral BID  . insulin aspart  2-6 Units Subcutaneous Q4H  . insulin detemir  5 Units Subcutaneous Q12H  .  levalbuterol  0.63 mg Nebulization BID  . mouth rinse  15 mL Mouth Rinse q12n4p  . pantoprazole  40 mg Oral Daily  . sodium chloride flush  3 mL Intravenous Q12H  . spironolactone  12.5 mg Oral Daily   Continuous Infusions: . sodium chloride Stopped (12/04/18 1251)  . sodium chloride Stopped (12/07/18 6237)  . amiodarone 30 mg/hr (12/07/18 0700)  . dexmedetomidine (PRECEDEX) IV infusion Stopped (12/05/18 1621)  . epinephrine 2 mcg/min (12/07/18 0700)  . lactated ringers    . lactated ringers 10 mL/hr at 12/07/18 0500  . milrinone 0.375 mcg/kg/min (12/07/18 0700)  . nitroGLYCERIN Stopped (12/03/18 2300)  . norepinephrine (LEVOPHED) Adult infusion Stopped (12/06/18 0331)  . phenylephrine (NEO-SYNEPHRINE) Adult infusion Stopped (12/05/18 0748)  . potassium chloride 10 mEq (12/07/18 0637)   PRN Meds:.sodium chloride, hydrALAZINE, metoprolol tartrate, morphine injection, ondansetron (ZOFRAN) IV, oxyCODONE, traMADol  General appearance: alert, cooperative and no distress Heart: regular rate and rhythm Lungs: rhonchi bilaterally Abdomen: soft, non-tender; bowel sounds normal; no masses,  no organomegaly Extremities: edema trace Wound: clean and dry, Left EVH leg with extensive ecchymosis  Lab Results: CBC: Recent Labs    12/06/18 0428  12/06/18 2135 12/07/18 0428  WBC 18.4*  --   --  26.9*  HGB 9.7*   < > 12.6* 9.8*  HCT 28.8*   < > 37.0* 29.5*  PLT 127*  --   --  182   < > = values in this interval not displayed.   BMET:  Recent Labs    12/06/18 0428  12/06/18 2135 12/07/18 0428  NA 133*   < > 133* 134*  K 3.2*   < > 3.7 3.4*  CL 97*  --   --  95*  CO2 26  --   --  27  GLUCOSE 140*  --   --  162*  BUN 17  --   --  18  CREATININE 1.16  --   --  1.20  CALCIUM 7.6*  --   --  7.6*   < > = values in this interval not displayed.    CMET: Lab Results  Component Value Date   WBC 26.9 (H) 12/07/2018   HGB 9.8 (L) 12/07/2018   HCT 29.5 (L) 12/07/2018   PLT 182  12/07/2018   GLUCOSE 162 (H) 12/07/2018   CHOL 233 (H) 11/25/2018   TRIG 155 (H) 11/25/2018   HDL 31 (L) 11/25/2018   LDLCALC 171 (H) 11/25/2018   ALT 19 12/07/2018   AST 18 12/07/2018   NA 134 (L) 12/07/2018   K 3.4 (L) 12/07/2018   CL 95 (L) 12/07/2018   CREATININE 1.20 12/07/2018   BUN 18 12/07/2018   CO2 27 12/07/2018   TSH 0.350 11/24/2018   INR 1.5 (H) 11/30/2018   HGBA1C 5.8 (H) 11/24/2018      PT/INR: No results for input(s): LABPROT, INR in the last 72 hours. Radiology: Dg Chest Port 1 View  Result Date: 12/07/2018 CLINICAL DATA:  Status post cardiac surgery EXAM: PORTABLE CHEST 1 VIEW COMPARISON:  12/06/2018 FINDINGS: Cardiac shadow is mildly enlarged but stable. Postsurgical changes are again seen. Right jugular sheath is again noted and stable. Bilateral thoracostomy catheters and mediastinal drain are seen. No pneumothorax is noted. Improved aeration is noted in the left base. IMPRESSION: Tubes and lines as described above. No focal infiltrate is noted. Electronically Signed   By: Inez Catalina M.D.   On: 12/07/2018 07:45     Assessment/Plan: S/P Procedure(s) (LRB): REMOVAL OF IMPELLA LEFT VENTRICULAR ASSIST DEVICE.  EVACUATION OF LEFT HEMOTHORAX (N/A) TRANSESOPHAGEAL ECHOCARDIOGRAM (TEE) (N/A) Sternal Plating with Sternal Rewiring  1. CV- NSR, BP stable- on Milrinone, Epi, Amiodarone.. wean as able 2. Pulm- acute respiratory distress this morning, on BIPAP, ABG showing Alkalosis, concerning for PE with elevated WBC and Fever present, will get CTA chest to R/O PE per discuss with Dr. Orvan Seen 3. Renal- creatinine stable, on Lasix drip, K is at 3.4, will replace 4. ID- leukocytosis, febrile this morning- get blood cultures, UA to R/O infectious etiology 5. Neuro- change in mental status, likely due to respiratory issues, however will need CT head to RO ischemic injury 6. Expected post operative blood loss anemia, mild Hgb at 9.8 7. Dispo- patient with new onset  respiratory distress this morning, + leukocytosis, + fever, AMS, will get cultures, UA as mentioned above, complete workup with CT head, chest A/P to R/O PE as discussed with Dr. Orvan Seen, monitor closely, continue current care     Erin Barrett 12/07/2018 7:50 AM   Pt seen and examined; unclear as to etiology of AMS and hypoxia, but PE and infection are suspected. Appreciate Dr. Clayborne Dana assistance as well as new consult from CCM. Jeree Delcid Z. Orvan Seen, [place]

## 2018-12-07 NOTE — Consult Note (Signed)
NAME:  Curtis [name], MRN:  [id number], DOB:  19-Mar-1950, LOS: 21 ADMISSION DATE:  11/23/2018, CONSULTATION DATE: 12/07/2018 REFERRING MD: Dr. Orvan Seen MD, CHIEF COMPLAINT: Respiratory failure, altered mental status  Brief History   68 year old status post CABG, mitral valve replacement developed cardiogenic shock with Impella placement.  Hospital course complicated by postop bleeding, hemothorax, hemorrhagic shock requiring OR takeback and evacuation of left hemothorax. Patient became more dyspneic 10/5 with altered mental status, elevated WBC count.  PCCM consulted for help with management.  Past Medical History  Coronary artery disease, tobacco abuse  Significant Hospital Events   9/21-admit with acute respiratory failure, CHF MI 9/22-Cardiac catheterization 9/28-CABG, mitral valve replacement. 10/2-Impella removed, evacuation of left hemothorax 10/3-extubated  Consults:  PCCM, cardiology  Procedures:  Rt IJ 9/28 Lt radial A line 9/28  Significant Diagnostic Tests:    Micro Data:    Antimicrobials:    Interim history/subjective:    Objective   Blood pressure (!) 101/58, pulse 95, temperature 98.4 F (36.9 C), temperature source Axillary, resp. rate (!) 24, height 5\' 9"  (1.753 m), weight 90.5 kg, SpO2 100 %. CVP:  [3 mmHg-4 mmHg] 3 mmHg  FiO2 (%):  [50 %] 50 %   Intake/Output Summary (Last 24 hours) at 12/07/2018 0910 Last data filed at 12/07/2018 0700 Gross per 24 hour  Intake 1118.03 ml  Output 2795 ml  Net -1676.97 ml   Filed Weights   12/04/18 0600 12/06/18 0500 12/07/18 0500  Weight: 92.2 kg 92.1 kg 90.5 kg    Examination: Gen:   Critically ill-appearing, on BiPAP HEENT:  EOMI, sclera anicteric Neck:     No masses; no thyromegaly Lungs:    Clear to auscultation bilaterally; normal respiratory effort CV:         Regular rate and rhythm; no murmurs Abd:      + bowel sounds; soft, non-tender; no palpable masses, no distension Ext:    No edema; adequate  peripheral perfusion Skin:      Warm and dry; no rash Neuro: Somnolent, Arousable.  Resolved Hospital Problem list     Assessment & Plan:  Acute respiratory failure, BiPAP dependence Agree with CTA to rule out pulmonary embolism Recheck ABG.  Close monitoring as he may worsen requiring reintubation Elevated WBC count is concerning for HCAP.  Will start empiric vancomycin, cefepime Check cultures, procalcitonin  Cardiogenic shock, MI Status post CABG, mitral valve replacement Required Impella during this admission. Management per primary team Continue epi, milrinone drip. Continue aspirin, statin.  Severe MR, paroxysmal atrial fib Status post bioprosthetic mitral valve replacement Amiodarone drip.  Blood loss anemia Follow CBC.  Best practice:  Diet: NPO Pain/Anxiety/Delirium protocol (if indicated): NA VAP protocol (if indicated): NA DVT prophylaxis: Lovenox GI prophylaxis: Protonix Glucose control: SSI, Levemir Mobility: Bedrest Code Status: Full code Family Communication: Per Primary Disposition: ICU  Labs   CBC: Recent Labs  Lab 12/03/18 0416  12/04/18 0433  12/04/18 1642  12/05/18 0401  12/05/18 1829 12/06/18 0428 12/06/18 1949 12/06/18 2135 12/07/18 0428  WBC 15.7*  --  28.8*  --  24.5*  --  24.7*  --   --  18.4*  --   --  26.9*  NEUTROABS 12.8*  --   --   --  18.9*  --   --   --   --   --   --   --   --   HGB 8.7*   < > 8.5*   < > 9.3*   < >  9.7*   < > 10.5* 9.7* 10.9* 12.6* 9.8*  HCT 24.5*   < > 24.1*   < > 26.6*   < > 28.2*   < > 31.0* 28.8* 32.0* 37.0* 29.5*  MCV 90.4  --  91.3  --  89.3  --  90.1  --   --  91.4  --   --  93.4  PLT 62*  --  144*  --  121*  --  128*  --   --  127*  --   --  182   < > = values in this interval not displayed.    Basic Metabolic Panel: Recent Labs  Lab 12/01/18 1545  12/02/18 1457  12/03/18 0416  12/04/18 0428  12/04/18 1642 12/04/18 1646  12/05/18 0401  12/05/18 1829 12/06/18 0428 12/06/18 1949 12/06/18  2135 12/07/18 0428  NA 141   < > 138   < > 134*   < > 134*   < > 132* 134*   < > 132*   < > 133* 133* 133* 133* 134*  K 3.5   < > 3.6   < > 3.7   < > 2.9*   < > 3.5 3.5   < > 3.8   < > 4.2 3.2* 3.7 3.7 3.4*  CL 106  --  104  --  103  --  98  --  101  --   --  104  --   --  97*  --   --  95*  CO2 25  --  24  --  24  --  25  --  23  --   --  19*  --   --  26  --   --  27  GLUCOSE 102*  --  125*  --  153*  --  125*   < > 117* 116*  --  131*  --   --  140*  --   --  162*  BUN 22  --  18  --  19  --  21  --  18  --   --  17  --   --  17  --   --  18  CREATININE 1.36*  --  1.38*  --  1.24  --  1.27*  --  1.11  --   --  1.23  --   --  1.16  --   --  1.20  CALCIUM 7.5*  --  7.7*  --  7.5*  --  7.5*  --  7.5*  --   --  7.2*  --   --  7.6*  --   --  7.6*  MG 2.0  --  1.7  --  2.0  --   --   --   --   --   --   --   --   --  1.9  --   --  1.9  PHOS  --   --   --   --  3.6  --   --   --   --   --   --   --   --   --   --   --   --   --    < > = values in this interval not displayed.   GFR: Estimated Creatinine Clearance: 68.2 mL/min (by C-G formula based on SCr of 1.2 mg/dL). Recent Labs  Lab 12/04/18 1642 12/05/18 0401 12/06/18 [id number]  12/07/18 0428  WBC 24.5* 24.7* 18.4* 26.9*    Liver Function Tests: Recent Labs  Lab 12/06/18 0428 12/07/18 0428  AST 19 18  ALT 21 19  ALKPHOS 58 63  BILITOT 3.4* 2.6*  PROT 5.0* 5.0*  ALBUMIN 2.4* 2.3*   No results for input(s): LIPASE, AMYLASE in the last 168 hours. No results for input(s): AMMONIA in the last 168 hours.  ABG    Component Value Date/Time   PHART 7.567 (H) 12/06/2018 2135   PCO2ART 31.1 (L) 12/06/2018 2135   PO2ART 82.0 (L) 12/06/2018 2135   HCO3 28.2 (H) 12/06/2018 2135   TCO2 29 12/06/2018 2135   ACIDBASEDEF 2.0 12/05/2018 0420   O2SAT 66.1 12/07/2018 0400     Coagulation Profile: Recent Labs  Lab 11/30/18 1530 11/30/18 2019 11/30/18 2228  INR 1.8* 1.7* 1.5*    Cardiac Enzymes: No results for input(s): CKTOTAL,  CKMB, CKMBINDEX, TROPONINI in the last 168 hours.  HbA1C: Hgb A1c MFr Bld  Date/Time Value Ref Range Status  11/24/2018 03:37 AM 5.8 (H) 4.8 - 5.6 % Final    Comment:    (NOTE) Pre diabetes:          5.7%-6.4% Diabetes:              >6.4% Glycemic control for   <7.0% adults with diabetes     CBG: Recent Labs  Lab 12/06/18 1556 12/06/18 1958 12/06/18 2348 12/07/18 0354 12/07/18 0759  GLUCAP 126* 160* 130* 113* 123*    Review of Systems:   Unable to obtain due to altered mental status  Past Medical History  He,  has a past medical history of Coronary artery disease, Elevated troponin (11/25/2018), and Tobacco use.   Surgical History    Past Surgical History:  Procedure Laterality Date  . CORONARY ARTERY BYPASS GRAFT N/A 11/30/2018   Procedure: MEDIASTINAL POST-OP BLEED;  Surgeon: Linden Dolin, MD;  Location: [facility] OR;  Service: Open Heart Surgery;  Laterality: N/A;  . CORONARY ARTERY BYPASS GRAFT N/A 11/30/2018   Procedure: CORONARY ARTERY BYPASS GRAFTING (CABG) x Three , using left internal mammary artery and left leg greater saphenous vein harvested endoscopically;  Surgeon: Linden Dolin, MD;  Location: [facility] OR;  Service: Open Heart Surgery;  Laterality: N/A;  . LEFT HEART CATH AND CORONARY ANGIOGRAPHY N/A 11/24/2018   Procedure: LEFT HEART CATH AND CORONARY ANGIOGRAPHY;  Surgeon: Lyn Records, MD;  Location: [facility] INVASIVE CV LAB;  Service: Cardiovascular;  Laterality: N/A;  . MITRAL VALVE REPAIR N/A 11/30/2018   Procedure: MITRAL VALVE REPLACEMENT (MVR) USING MAGNA EASE SIZE 27mm;  Surgeon: Linden Dolin, MD;  Location: [facility] OR;  Service: Open Heart Surgery;  Laterality: N/A;  . PLACEMENT OF IMPELLA LEFT VENTRICULAR ASSIST DEVICE N/A 11/30/2018   Procedure: PLACEMENT OF IMPELLA LEFT VENTRICULAR ASSIST DEVICE;  Surgeon: Linden Dolin, MD;  Location: [facility] OR;  Service: Open Heart Surgery;  Laterality: N/A;  . TEE WITHOUT CARDIOVERSION N/A 11/26/2018   Procedure:  TRANSESOPHAGEAL ECHOCARDIOGRAM (TEE);  Surgeon: Jake Bathe, MD;  Location: [facility] ENDOSCOPY;  Service: Cardiovascular;  Laterality: N/A;  . TEE WITHOUT CARDIOVERSION N/A 11/30/2018   Procedure: TRANSESOPHAGEAL ECHOCARDIOGRAM (TEE);  Surgeon: Linden Dolin, MD;  Location: [facility] OR;  Service: Open Heart Surgery;  Laterality: N/A;     Social History   reports that he has been smoking cigarettes. He has been smoking about 0.25 packs per day. He has never used smokeless tobacco. He reports that he does not  drink alcohol or use drugs.   Family History   His family history includes Heart attack in his father and mother.   Allergies Allergies  Allergen Reactions  . Penicillins Rash and Other (See Comments)    Did it involve swelling of the face/tongue/throat, SOB, or low BP? Unknown Did it involve sudden or severe rash/hives, skin peeling, or any reaction on the inside of your mouth or nose? Yes Did you need to seek medical attention at a hospital or doctor's office? Unknown When did it last happen? childhood If all above answers are "NO", may proceed with cephalosporin use.      Home Medications  Prior to Admission medications   Not on File     Critical care time: 28    The patient is critically ill with multiple organ system failure and requires high complexity decision making for assessment and support, frequent evaluation and titration of therapies, advanced monitoring, review of radiographic studies and interpretation of complex data.   Critical Care Time devoted to patient care services, exclusive of separately billable procedures, described in this note is 35 minutes.   Chilton Greathouse MD [facility] Pager [phone] If no answer call [phone] 12/07/2018, 9:28 AM

## 2018-12-07 NOTE — TOC Progression Note (Signed)
Transition of Care [facility]) - Progression Note    Patient Details  Name: Adrean Heitz MRN: [id number] Date of Birth: [birth year]  [facility] CM/SW Contact  Graves-Bigelow, Ocie Cornfield, RN Phone Number: 12/07/2018, 4:29 PM  Clinical Narrative:   Pt presented for NStemi- post CABG 11-30-18 Pt continues on IV Milrinone and IV Amio gtt. At this time patient is a candidate for LTAC- with the gtts in place- please place Case Management Consult for LTAC- if you wish for Case Manager to pursue. CM did discuss with staff RN. CM will follow for additional transition of care needs.     Expected Discharge Plan: Long Term Acute Care (LTAC) Barriers to Discharge: Continued Medical Work up  Expected Discharge Plan and Services Expected Discharge Plan: [facility] (LTAC)     Social Determinants of Health (SDOH) Interventions    Readmission Risk Interventions No flowsheet data found.

## 2018-12-07 NOTE — Progress Notes (Signed)
CT surgery p.m. Rounds  Patient resting comfortably on nasal cannula,  responded to diuresis Sinus rhythm Was up in chair today Incisions clean and dry

## 2018-12-07 NOTE — Progress Notes (Signed)
Pharmacy Antibiotic Note  Curtis [name] is a 68 y.o. male admitted on 11/23/2018 with cardiogenic shock. Now s/p CABG with MVR complicated by postop bleeding, hemothorax, and hemorrhagic shock s/p evacuation of L hemothorax. WBC climbing with concern for HCAP. Pharmacy has been consulted for vancomycin and cefepime dosing. Cultures and procalcitonin pending. Has been afebrile.   Allergy noted to penicillin but reaction only listed as rash during childhood. Will trial cephalosporin at this time due to low likelihood of cross-sensitivity and no anaphylaxis noted with penicillin.  Plan: Cefepime 2 g IV q8h Vancomycin 2000 mg IV x 1, then 1750 mg IV Q 24 hrs. Goal AUC 400-550. Expected AUC: 485 SCr used: 1.2 Monitor cultures, pct, fever curve, LOT, and vancomycin levels as needed  Height: 5\' 9"  (175.3 cm) Weight: 199 lb 8.3 oz (90.5 kg) IBW/kg (Calculated) : 70.7  Temp (24hrs), Avg:98.1 F (36.7 C), Min:97.5 F (36.4 C), Max:98.4 F (36.9 C)  Recent Labs  Lab 12/04/18 0428 12/04/18 0433 12/04/18 1642 12/05/18 0401 12/06/18 0428 12/07/18 0428  WBC  --  28.8* 24.5* 24.7* 18.4* 26.9*  CREATININE 1.27*  --  1.11 1.23 1.16 1.20    Estimated Creatinine Clearance: 68.2 mL/min (by C-G formula based on SCr of 1.2 mg/dL).    Allergies  Allergen Reactions  . Penicillins Rash and Other (See Comments)    Did it involve swelling of the face/tongue/throat, SOB, or low BP? Unknown Did it involve sudden or severe rash/hives, skin peeling, or any reaction on the inside of your mouth or nose? Yes Did you need to seek medical attention at a hospital or doctor's office? Unknown When did it last happen? childhood If all above answers are "NO", may proceed with cephalosporin use.     Antimicrobials this admission: Levofloxacin 9/28-9/29 + vanc 9/28, 10/2 periop abx Vancomycin 10/5 >> Cefepime 10/5 >>  Dose adjustments this admission: None  Microbiology results: 9/21 COVID negative 9/23  MRSA PCR 10/5 BCx: sent 10/5 UCx: sent 10/5 TA: sent  Thank you for allowing pharmacy to be a part of this patient's care.  Vertis Kelch, PharmD PGY2 Cardiology Pharmacy Resident Phone [phone] 12/07/2018       10:22 AM  Please check [email or web address] for unit-specific pharmacist phone numbers

## 2018-12-07 NOTE — Progress Notes (Signed)
Assisted with transport to CT and back on BIPAP no respiratory issues at this time.

## 2018-12-07 NOTE — Discharge Summary (Signed)
Physician Discharge Summary       [address]       [place] [address]             [phone]    Patient ID: Curtis [name] MRN: [id number] DOB/AGE: 04-Apr-1950 68 y.o.  Admit date: 11/23/2018 Discharge date: 12/11/2018  Admission Diagnoses: 1. NSTEMI (non-ST elevated myocardial infarction) (HCC) 2. Coronary artery disease involving native heart without angina pectoris  Discharge Diagnoses:  1. S/P CABG x 3 2. Acute respiratory failure with hypoxia and hypercapnia (HCC) 3. Pulmonary edema 4. Hypertension 5. Acute on chronic combined systolic and diastolic CHF (congestive heart failure) (HCC)     Consults: pulmonary/intensive care and Heart failure  Procedure (s):   Coronary Artery Bypass Grafting x 3              Left Internal Mammary Artery to Distal Left Anterior Descending Coronary Artery; Saphenous Vein Graft to  Obtuse Marginal Branch of Left Circumflex Coronary Artery; Sapheonous Vein Graft to Diagonal Branch Coronary Artery, Endoscopic Vein Harvest from left Thigh and Lower Leg Mitral valve replacement (27 mm CE Magna Ease bovine bioprosthetic); insertion of Impella LVAD; completion graft surveillance with indocyanine green fluorescence imaging (SPY) by Dr. Vickey Sages on 11/30/2018  MEDIASTINAL EXPLORATION for post op bleeding done by Dr. Vickey Sages on 11/30/2018  REMOVAL OF IMPELLA LEFT VENTRICULAR ASSIST DEVICE.  EVACUATION OF LEFT HEMOTHORAX - General    * TRANSESOPHAGEAL ECHOCARDIOGRAM (TEE) - General    * Sternal Plating with Sternal Rewiring by Dr. Vickey Sages on 0/04/2018.  History of Presenting Illness: Curtis [name] is a 68 year old male with no known chronic illnesses who has not seen a physician in the past 7 years.  He is an active smoker and has family history significant for premature coronary artery disease.  He was in his usual state of health until about 8 hours prior to his arrival to the hospital via EMS on 11/23/18 when he says he was awakened from sleep  with acute onset shortness of breath.  He denied having any chest pain at the time nor has he had any chest pain since then.  On arrival to the emergency room, he was noted to be diaphoretic, tachypneic, and tachycardic.  EKG demonstrated a narrow complex tachycardia with a heart rate of around 140.  He was treated with adenosine and his heart rate declined to about 104 with evidence of left bundle branch block.  Further work-up in the emergency room included chest x-ray that showed pulmonary edema consistent with acute heart failure.  His initial troponin was elevated at 63 but later serial studies increased to 4400 then further increased to in excess of 9000.  In the emergency room, the patient was treated with topical nitrates and IV Lasix and was placed on BiPAP with significant improvement in his symptoms.  He was admitted to the hospital with diagnosis of acute myocardial infarction.  He remained stable on heparin drip.  He went on to have left heart catheterization yesterday afternoon by Dr. Verdis Prime by way of the right radial artery.  This study demonstrated an occluded right coronary artery.  There was a 30 to 40% left main coronary artery stenosis.  The LAD had a 50% proximal stenosis followed by a 70% proximal stenosis.  The first diagonal was severely diseased with an 80% stenosis.  The circumflex was codominant with an 85% mid to distal stenosis.  Ejection fraction was estimated at 25 to 30%.  Echocardiogram has been completed and  showed an estimated ejection fraction of 20 to 25%.  There was moderate to severe mitral insufficiency and mild to moderate aortic insufficiency.  There were significant regional wall motion abnormalities involving the septum, inferior wall, lateral wall, and apex.  We have been asked to evaluate Curtis [name] for consideration of coronary revascularization as well as to address his valvular dysfunction in the setting of acute myocardial infarction with a presentation of  acute, combined systolic and diastolic heart failure.  Coronary artery revascularization and possible mitral valve repair or replacement is indicated and briefly described to the patient.  He states that, at this time, he is interested in considering surgery.  Potential risks, benefits, and complications of the surgery were discussed with the patient and he ultimately did agree to proceed with surgery. Pre operative carotid duplex showed no significant internal carotid artery stenosis bilaterally. He underwent a CABG x 3 on 11/30/2018.   Brief Hospital Course:  The patient continued to have excessive chest tube output, despite having corrected his coagulopathy. He underwent a mediastinal exploration late on 09/28. Heart failure was consulted regarding cardiogenic shock. He was on Epinephrine, Milrinone, and Nitro drips, and Impella was continued. He remained intubated for a few days. He was later extubated the morning of 09/30.  Theone Murdoch, a line, chest tubes, and foley were removed early in the post operative course. Sherryll Burger was started.  He was volume over loaded and diuresed by heart failure team.  He had ABL anemia.  Patient then underwent removal of Impella, evacuation of left hemothorax, and sternal plating and rewiring on 10/02. He was extubated again on 10/03. He went into PAF and was put on Amiodarone. He converted to sinus rhythm. Nitro  was stopped. Milrinone and Epi were stopped on 10/07. He became more dyspneic and had altered mental status on 10/05. Pulmonary/CCM was consulted. He had leukocytosis and there was a concern for HCAP. He was put on IV Vanco and Cefepime.  WBC on 10/09 was 15,900. Blood and urine cultures are negative to date. He has no signs of a wound infection. He just had a new midline IV placed 10/08. He was weaned off the insulin drip.  The patient's glucose remained well controlled. The patient's HGA1C pre op was 5.8.  He likely has pre diabetes and will need further follow up  with his medical doctor. He will be provided with nutrition information. The patient was felt surgically stable for transfer from the ICU to PCTU for further convalescence on 10/07;however, a bed was not available until 10/08.  He continues to progress with PT/OT. He was ambulating on room air. He has been tolerating a diet and has had a bowel movement. Epicardial pacing wires were removed on 10/09.  Chest tube sutures are to be removed 10/15. Please remove chest staples on 10/16. As discussed with Dr. Vickey Sages, the patient is felt surgically stable for discharge to CIR today.   Latest Vital Signs: Blood pressure (!) 123/53, pulse 88, temperature (!) 97.5 F (36.4 C), temperature source Oral, resp. rate (!) 22, height 5\' 9"  (1.753 m), weight 85.8 kg, SpO2 97 %.  Physical Exam: Cardiovascular: RRR, no murmurs, gallops, or rubs. Pulmonary: Clear to auscultation bilaterally; no rales, wheezes, or rhonchi. Abdomen: Soft, non tender, bowel sounds present. Extremities: Mild bilateral lower extremity edema. Wounds: Clean and dry.  No erythema or signs of infection.   Discharge Condition: Stable and discharged to CIR.  Recent laboratory studies:  Lab Results  Component Value Date  WBC 15.9 (H) 12/11/2018   HGB 10.8 (L) 12/11/2018   HCT 34.4 (L) 12/11/2018   MCV 96.1 12/11/2018   PLT 362 12/11/2018   Lab Results  Component Value Date   NA 135 12/11/2018   K 3.9 12/11/2018   CL 102 12/11/2018   CO2 22 12/11/2018   CREATININE 1.24 12/11/2018   GLUCOSE 118 (H) 12/11/2018      Diagnostic Studies: Dg Chest 1 View  Result Date: 12/04/2018 CLINICAL DATA:  Open heart surgery EXAM: CHEST  1 VIEW COMPARISON:  12/04/2018, 8:20 a.m. FINDINGS: Interval repositioning of a left-sided chest tube with resolution of a previously seen fluid collection about the left upper lobe. There is likely a small, layering left pleural effusion. Interval repositioning of right-sided chest tube. Mediastinal drainage  tubes remain in position. The right lung is normally aerated. There has been interval endotracheal intubation, tube tip over the mid trachea. Esophagogastric tube with tip and side port below the diaphragm. Right neck pulmonary artery catheter remains with tip projecting over the pulmonary outflow tract. Interval removal of Impella pump. Cardiomegaly status post median sternotomy with aortic valve prosthesis. IMPRESSION: 1. Interval repositioning of a left-sided chest tube with resolution of a previously seen fluid collection about the left upper lobe. There is likely a small, layering left pleural effusion. Interval repositioning of right-sided chest tube. Mediastinal drainage tubes remain in position. The right lung is normally aerated. 2. There has been interval endotracheal intubation, tube tip over the mid trachea. Esophagogastric tube with tip and side port below the diaphragm. Right neck pulmonary artery catheter remains with tip projecting over the pulmonary outflow tract. 3.  Interval removal of Impella pump. 4. Cardiomegaly status post median sternotomy with aortic valve prosthesis. Electronically Signed   By: Lauralyn PrimesAlex  Bibbey M.D.   On: 12/04/2018 16:59   Dg Chest 1 View  Result Date: 12/02/2018 CLINICAL DATA:  Postoperative status post CABG. EXAM: CHEST  1 VIEW COMPARISON:  12/01/2018 FINDINGS: Endotracheal tube remains in place. Enteric tube coursing off the field of the radiograph. Stable position of the right-sided Swan-Ganz catheter and Impella device since previous exam. Signs of mitral valve replacement as before. Bilateral chest tubes remaining in place without signs of visible pneumothorax. Signs of mild basilar atelectasis persist. Epicardial pacer wires project over the upper abdomen. Cardiomediastinal contours are unchanged with postoperative changes of sternotomy, skin staples in place. IMPRESSION: Unchanged appearance of Impella device, projecting somewhat medially but unchanged in position.  Stable appearance of prominence of superior mediastinum presumably postoperative. Electronically Signed   By: Donzetta KohutGeoffrey  Wile M.D.   On: 12/02/2018 08:08   Dg Chest 1 View  Result Date: 12/01/2018 CLINICAL DATA:  Status post open heart surgery. EXAM: CHEST  1 VIEW COMPARISON:  Radiograph yesterday at 15:35 p.m. FINDINGS: Endotracheal tube tip 2.3 cm from the carina. Tip and side port of enteric tube below the diaphragm in the stomach. Right internal jugular Swan-Ganz catheter tip in the region of the main pulmonary outflow tract. Left and right chest tubes and mediastinal drains in place. Impella device in place. Post median sternotomy. Cardiomegaly with prosthetic valve. Similar upper mediastinal widening. Right apical opacity may be layering pleural fluid. No visualized pneumothorax. Increased right infrahilar opacity favors atelectasis. IMPRESSION: 1. Support apparatus as described. 2. Similar upper mediastinal widening, patient had tortuous vasculature on preoperative imaging. 3. Increased right infrahilar opacity favors atelectasis. Probable right pleural effusion. Electronically Signed   By: Narda RutherfordMelanie  Sanford M.D.   On: 12/01/2018 00:28  Dg Chest 2 View  Result Date: 12/11/2018 CLINICAL DATA:  Pleural effusion EXAM: CHEST - 2 VIEW COMPARISON:  Two days ago FINDINGS: Chronic cardiomegaly. Mitral valve replacement. CABG. Right PICC with tip at the SVC. Chronic mild interstitial coarsening. There is no edema, consolidation, or pneumothorax. Trace pleural fluid on both sides. Hyperinflation. IMPRESSION: Trace pleural effusions. Interval removal of chest tube with no visible pneumothorax. Electronically Signed   By: Marnee Spring M.D.   On: 12/11/2018 10:47   Dg Chest 2 View  Result Date: 11/29/2018 CLINICAL DATA:  Pulmonary infiltrate. EXAM: CHEST - 2 VIEW COMPARISON:  November 23, 2018 FINDINGS: The cardiomediastinal silhouette is stable. Minimal opacity remains in the left base. Otherwise,  infiltrates have resolved. IMPRESSION: Minimal opacity remains in the left base. Otherwise, infiltrates have resolved. No other changes. Electronically Signed   By: Gerome Sam III M.D   On: 11/29/2018 19:57   Ct Head Wo Contrast  Result Date: 12/07/2018 CLINICAL DATA:  Altered level of consciousness. EXAM: CT HEAD WITHOUT CONTRAST TECHNIQUE: Contiguous axial images were obtained from the base of the skull through the vertex without intravenous contrast. COMPARISON:  None. FINDINGS: Brain: No evidence of acute infarction, hemorrhage, extra-axial collection, ventriculomegaly, or mass effect. Small left basal ganglia lacunar infarct. Generalized cerebral atrophy. Periventricular white matter low attenuation likely secondary to microangiopathy. Vascular: Cerebrovascular atherosclerotic calcifications are noted. Skull: Negative for fracture or focal lesion. Sinuses/Orbits: Visualized portions of the orbits are unremarkable. Mastoid sinuses are clear. Mucous retention cyst in bilateral maxillary sinuses. Other: None. IMPRESSION: 1. No acute intracranial pathology. 2. Chronic microvascular disease and cerebral atrophy. Electronically Signed   By: Elige Ko   On: 12/07/2018 09:06   Ct Chest Wo Contrast  Result Date: 11/27/2018 CLINICAL DATA:  68 year old male undergoing preoperative evaluation prior to CABG. Assess for aortic disease. EXAM: CT CHEST WITHOUT CONTRAST TECHNIQUE: Multidetector CT imaging of the chest was performed following the standard protocol without IV contrast. COMPARISON:  None. FINDINGS: Cardiovascular: Limited evaluation in the absence of intravenous contrast. Two vessel aortic arch. The right brachiocephalic and left common carotid artery arise from a common origin. The aortic root is normal in caliber. The ascending thoracic aorta is mildly ectatic at 3.7 cm. Mild atherosclerotic calcifications are present throughout the aorta. Extensive coronary artery disease with calcifications  throughout all visualized coronary arteries. Mild cardiomegaly. Trace pericardial fluid versus thickening anteriorly. The pulmonary artery is normal in caliber. Mediastinum/Nodes: Unremarkable CT appearance of the thyroid gland. No suspicious mediastinal or hilar adenopathy. No soft tissue mediastinal mass. The thoracic esophagus is unremarkable. Nonspecific 0.8 cm paraesophageal lymph node. Lungs/Pleura: Mild centrilobular pulmonary emphysema. Diffuse mild bronchial wall thickening. Mild dependent atelectasis. Tiny calcified subpleural granuloma in the periphery of the left upper lobe noted incidentally. This is a benign finding. Upper Abdomen: Approximately 6 mm low-attenuation lesion in the medial hepatic dome is incompletely characterized in the absence of intravenous contrast. Statistically, this is likely a benign cyst. No acute abnormality within the visualized upper abdomen. Musculoskeletal: No acute fracture or aggressive appearing lytic or blastic osseous lesion. Multilevel degenerative endplate spurring. IMPRESSION: 1. Ectatic ascending thoracic aorta with a maximal diameter of 3.7 cm. 2. Scattered mild aortic atherosclerotic calcifications. Aortic Atherosclerosis (ICD10-170.0) 3. Mild pericardial fluid versus thickening in the anterior pericardium. 4. Common origin of the right brachiocephalic and left common carotid arteries (commonly called a bovine arch). 5. Mild centrilobular pulmonary emphysema and diffuse mild bronchial wall thickening suggests underlying COPD. 6. Additional ancillary findings as  above. Electronically Signed   By: Jacqulynn Cadet M.D.   On: 11/27/2018 09:57   Ct Angio Chest Pe W Or Wo Contrast  Result Date: 12/07/2018 CLINICAL DATA:  Hypoxia, abdominal distention. EXAM: CT ANGIOGRAPHY CHEST CT ABDOMEN AND PELVIS WITH CONTRAST TECHNIQUE: Multidetector CT imaging of the chest was performed using the standard protocol during bolus administration of intravenous contrast.  Multiplanar CT image reconstructions and MIPs were obtained to evaluate the vascular anatomy. Multidetector CT imaging of the abdomen and pelvis was performed using the standard protocol during bolus administration of intravenous contrast. CONTRAST:  156mL OMNIPAQUE IOHEXOL 350 MG/ML SOLN COMPARISON:  CT scan of November 27, 2018. FINDINGS: CTA CHEST FINDINGS Cardiovascular: Satisfactory opacification of the pulmonary arteries to the segmental level. No evidence of pulmonary embolism. Mild cardiomegaly is noted. Atherosclerosis of thoracic aorta is noted without aneurysm formation. Status post mitral valve repair. Coronary artery calcifications are noted. No pericardial effusion. Mediastinum/Nodes: The esophagus is unremarkable. No significant adenopathy is noted. Thyroid gland is unremarkable. Mediastinal drain is noted anteriorly. Lungs/Pleura: Bilateral chest tubes are noted without definite pneumothorax. Mild bilateral lower lobe subsegmental atelectasis is noted. Musculoskeletal: Sternotomy wires are noted. No other significant osseous abnormality is noted. Review of the MIP images confirms the above findings. CT ABDOMEN and PELVIS FINDINGS Hepatobiliary: No focal liver abnormality is seen. No gallstones, gallbladder wall thickening, or biliary dilatation. Pancreas: Unremarkable. No pancreatic ductal dilatation or surrounding inflammatory changes. Spleen: Normal in size without focal abnormality. Adrenals/Urinary Tract: Adrenal glands appear normal. 13 mm calculus is noted in upper pole collecting system of left kidney. 8 mm calculus is also noted in upper pole of left kidney. Smaller nonobstructive calculus is seen in lower pole collecting system of right kidney. 1.9 cm rounded hyperdense abnormality is seen arising from lower pole right kidney. Large left parapelvic cysts are noted. No definite hydronephrosis or renal obstruction is noted. Urinary bladder catheter is noted. Stomach/Bowel: Stomach is within  normal limits. Appendix appears normal. No evidence of bowel wall thickening, distention, or inflammatory changes. Vascular/Lymphatic: Aortic atherosclerosis. No enlarged abdominal or pelvic lymph nodes. Reproductive: Mild prostatic enlargement is noted. Other: No abdominal wall hernia or abnormality. No abdominopelvic ascites. Musculoskeletal: No acute or significant osseous findings. Review of the MIP images confirms the above findings. IMPRESSION: No definite evidence of pulmonary embolus. Bilateral chest tubes are noted without pneumothorax. Mild bilateral posterior basilar subsegmental atelectasis is noted. 1.9 cm rounded hyperdense abnormality is seen arising from lower pole of right kidney which may represent hyperdense cyst, but neoplasm cannot be excluded. Ultrasound is recommended for further evaluation. Large nonobstructive left renal calculi are noted. Large left renal pelvic cysts are noted. Mild prostatic enlargement. Aortic Atherosclerosis (ICD10-I70.0). Electronically Signed   By: Marijo Conception M.D.   On: 12/07/2018 09:15   Ct Abdomen Pelvis W Contrast  Result Date: 12/07/2018 CLINICAL DATA:  Hypoxia, abdominal distention. EXAM: CT ANGIOGRAPHY CHEST CT ABDOMEN AND PELVIS WITH CONTRAST TECHNIQUE: Multidetector CT imaging of the chest was performed using the standard protocol during bolus administration of intravenous contrast. Multiplanar CT image reconstructions and MIPs were obtained to evaluate the vascular anatomy. Multidetector CT imaging of the abdomen and pelvis was performed using the standard protocol during bolus administration of intravenous contrast. CONTRAST:  128mL OMNIPAQUE IOHEXOL 350 MG/ML SOLN COMPARISON:  CT scan of November 27, 2018. FINDINGS: CTA CHEST FINDINGS Cardiovascular: Satisfactory opacification of the pulmonary arteries to the segmental level. No evidence of pulmonary embolism. Mild cardiomegaly is noted. Atherosclerosis of  thoracic aorta is noted without aneurysm  formation. Status post mitral valve repair. Coronary artery calcifications are noted. No pericardial effusion. Mediastinum/Nodes: The esophagus is unremarkable. No significant adenopathy is noted. Thyroid gland is unremarkable. Mediastinal drain is noted anteriorly. Lungs/Pleura: Bilateral chest tubes are noted without definite pneumothorax. Mild bilateral lower lobe subsegmental atelectasis is noted. Musculoskeletal: Sternotomy wires are noted. No other significant osseous abnormality is noted. Review of the MIP images confirms the above findings. CT ABDOMEN and PELVIS FINDINGS Hepatobiliary: No focal liver abnormality is seen. No gallstones, gallbladder wall thickening, or biliary dilatation. Pancreas: Unremarkable. No pancreatic ductal dilatation or surrounding inflammatory changes. Spleen: Normal in size without focal abnormality. Adrenals/Urinary Tract: Adrenal glands appear normal. 13 mm calculus is noted in upper pole collecting system of left kidney. 8 mm calculus is also noted in upper pole of left kidney. Smaller nonobstructive calculus is seen in lower pole collecting system of right kidney. 1.9 cm rounded hyperdense abnormality is seen arising from lower pole right kidney. Large left parapelvic cysts are noted. No definite hydronephrosis or renal obstruction is noted. Urinary bladder catheter is noted. Stomach/Bowel: Stomach is within normal limits. Appendix appears normal. No evidence of bowel wall thickening, distention, or inflammatory changes. Vascular/Lymphatic: Aortic atherosclerosis. No enlarged abdominal or pelvic lymph nodes. Reproductive: Mild prostatic enlargement is noted. Other: No abdominal wall hernia or abnormality. No abdominopelvic ascites. Musculoskeletal: No acute or significant osseous findings. Review of the MIP images confirms the above findings. IMPRESSION: No definite evidence of pulmonary embolus. Bilateral chest tubes are noted without pneumothorax. Mild bilateral posterior  basilar subsegmental atelectasis is noted. 1.9 cm rounded hyperdense abnormality is seen arising from lower pole of right kidney which may represent hyperdense cyst, but neoplasm cannot be excluded. Ultrasound is recommended for further evaluation. Large nonobstructive left renal calculi are noted. Large left renal pelvic cysts are noted. Mild prostatic enlargement. Aortic Atherosclerosis (ICD10-I70.0). Electronically Signed   By: Lupita Raider M.D.   On: 12/07/2018 09:15   Dg Chest Port 1 View  Result Date: 12/09/2018 CLINICAL DATA:  Pleural effusion.  CABG 5 days ago. EXAM: PORTABLE CHEST 1 VIEW COMPARISON:  CTA chest 12/07/2018 and one-view chest 12/07/2018. FINDINGS: Heart is enlarged. Moderate pulmonary vascular congestion is noted. A left-sided chest tube is in place. Bilateral chest tubes are in place. There is no pneumothorax. Right IJ sheath remains. A mediastinal drain is in place. IMPRESSION: 1. Cardiomegaly and moderate pulmonary vascular congestion. 2. Low lung volumes. 3. Support apparatus is stable.  No pneumothorax. Electronically Signed   By: Marin Roberts M.D.   On: 12/09/2018 08:17   Dg Chest Port 1 View  Result Date: 12/07/2018 CLINICAL DATA:  Status post cardiac surgery EXAM: PORTABLE CHEST 1 VIEW COMPARISON:  12/06/2018 FINDINGS: Cardiac shadow is mildly enlarged but stable. Postsurgical changes are again seen. Right jugular sheath is again noted and stable. Bilateral thoracostomy catheters and mediastinal drain are seen. No pneumothorax is noted. Improved aeration is noted in the left base. IMPRESSION: Tubes and lines as described above. No focal infiltrate is noted. Electronically Signed   By: Alcide Clever M.D.   On: 12/07/2018 07:45   Dg Chest Port 1 View  Result Date: 12/06/2018 CLINICAL DATA:  Chest tube in place. EXAM: PORTABLE CHEST 1 VIEW COMPARISON:  12/05/2018 FINDINGS: Right IJ central venous sheath remains in place with tip over the SVC. Bilateral basilar chest  tubes as well as mediastinal drain unchanged. Lungs are adequately inflated with stable left base opacification likely  small effusion with associated atelectasis. Minimal linear atelectasis right base. No pneumothorax. Mild stable cardiomegaly. Remainder of the exam is unchanged. IMPRESSION: Stable small left effusion with associated basilar atelectasis. Minimal linear atelectasis right base. Tubes and lines as described. Electronically Signed   By: Elberta Fortis M.D.   On: 12/06/2018 08:26   Dg Chest Port 1 View  Result Date: 12/05/2018 CLINICAL DATA:  Removal of the left ventricular assist device. EXAM: PORTABLE CHEST 1 VIEW COMPARISON:  12/04/2018 FINDINGS: Swan-Ganz catheter with the tip projecting over the right ventricular outflow tract. Nasogastric tube projecting over the stomach. Endotracheal tube with the tip 2.4 cm above the carina. Bilateral chest tubes in unchanged position. No pneumothorax. Small left pleural effusion unchanged from the prior exam. Mild left basilar atelectasis. Stable cardiomediastinal silhouette. Prior median sternotomy, CABG, and valve replacement. IMPRESSION: 1. Support lines and tubing in satisfactory unchanged position. 2. Bilateral chest tubes without a pneumothorax. 3. Trace left pleural effusion and left basilar atelectasis. Electronically Signed   By: Elige Ko   On: 12/05/2018 07:24   Dg Chest Port 1 View  Result Date: 12/04/2018 CLINICAL DATA:  Status post coronary bypass grafting EXAM: PORTABLE CHEST 1 VIEW COMPARISON:  12/03/2018 FINDINGS: Cardiac shadow is enlarged. Postsurgical changes are again seen. Impella catheter is noted and stable in appearance. Swan-Ganz catheter is noted in the pulmonary outflow tract. Mediastinal drain and bilateral thoracostomy catheters are seen. No pneumothorax is noted although there is increased density in the left apex laterally when compared with the prior exam. This is consistent with a loculated fluid collection and is  significantly larger than that seen on the prior exam now measuring approximately 8.8 by 5.4 cm in greatest dimension. Increase in left-sided pleural effusion is noted as well. No pneumothorax is noted. IMPRESSION: Increasing ovoid opacification in the lateral aspect of left upper hemithorax consistent with a loculated fluid collection. The possibility of focal hematoma deserves consideration. Slight increase in overall left pleural effusion inferiorly. Tubes and lines as described above. These results will be called to the ordering clinician or representative by the Radiologist Assistant, and communication documented in the PACS or zVision Dashboard. Electronically Signed   By: Alcide Clever M.D.   On: 12/04/2018 08:42   Dg Chest Port 1 View  Result Date: 12/03/2018 CLINICAL DATA:  Shortness of breath tonight. Recent open heart surgery CABG. EXAM: PORTABLE CHEST 1 VIEW COMPARISON:  Radiograph yesterday. FINDINGS: Removal of endotracheal and enteric tubes. Impella device unchanged in positioning, slightly medially located. Right internal jugular Swan-Ganz catheter tip in the region of the main pulmonary outflow tract. Bilateral chest tubes and mediastinal drain in place. Overall lower lung volumes after extubation. Increasing peripheral opacity in the left upper lateral hemithorax that appears extrapleural. No pneumothorax. Unchanged heart size and mediastinal contours. Slight increasing bibasilar opacities favoring atelectasis. Increasing central lung opacities. IMPRESSION: 1. Lower lung volumes after extubation. 2. Developing peripheral opacity in the left upper lateral hemithorax which appears extrapleural and likely represents partially loculated pleural fluid. Increasing bibasilar opacities/atelectasis. 3. Increasing central lung opacities may represent pulmonary edema. Electronically Signed   By: Narda Rutherford M.D.   On: 12/03/2018 01:54   Dg Chest Port 1 View  Result Date: 12/01/2018 CLINICAL DATA:   Postop day 1 status post CABG and subsequent evacuation of mediastinal hematoma. EXAM: PORTABLE CHEST 1 VIEW COMPARISON:  12/01/2018 at 12:10 a.m. FINDINGS: Endotracheal tube tip 5.3 cm above the carina. Right internal jugular Swan-Ganz catheter tip: Main pulmonary artery. Nasogastric  tube enters the stomach. Bilateral chest tubes. Mitral valve prosthesis. An Impella assist device is noted with the outlet projecting over the mid cardiac shadow presumably proximally in the ascending aorta, and the inlet projecting centrally over the lower portion of the cardiac shadow, presumably in a rightward portion of the left ventricle, although somewhat more medial than is occasionally encountered. Continued prominence the upper mediastinum potentially from hematoma or edema. No appreciable pneumothorax. No pneumomediastinum. Thoracic spondylosis noted. No significant pulmonary edema. There is mild atelectasis at both lung bases. IMPRESSION: 1. The Impella inlet is somewhat medial in position, and is presumably in the left ventricle but near the interventricular septum. The outlet projects over the ascending thoracic aorta in the expected position. Remaining tubes and lines appear unremarkable. 2. No pneumothorax or significant pulmonary edema. Mild atelectasis in the lung bases. 3. Continued prominence of the upper mediastinum potentially from edema or hematoma. Obscured AP window. Electronically Signed   By: Gaylyn Rong M.D.   On: 12/01/2018 08:20   Dg Chest Port 1 View  Result Date: 11/30/2018 CLINICAL DATA:  Status post CABG and mitral valve replacement EXAM: PORTABLE CHEST 1 VIEW COMPARISON:  November 29, 2018 FINDINGS: An ETT is in good position. A PA catheter terminates near the pulmonary outflow tract. Mediastinal and chest tubes are seen. No pneumothorax. No overt pulmonary edema or pulmonary infiltrate. IMPRESSION: 1. Support apparatus as above. 2. Postsurgical changes in the chest. Electronically Signed    By: Gerome Sam III M.D   On: 11/30/2018 15:48   Dg Chest Portable 1 View  Result Date: 11/24/2018 CLINICAL DATA:  Shortness of breath. EXAM: PORTABLE CHEST 1 VIEW COMPARISON:  None. FINDINGS: Mild cardiomegaly. Aortic atherosclerosis. Diffuse interstitial prominence with Kerley B-lines consistent with pulmonary edema. No confluent airspace disease. Left costophrenic angle not entirely included in the field of view. No large pleural effusion. No pneumothorax. No acute osseous abnormalities are seen. IMPRESSION: 1. Mild cardiomegaly.  Moderate pulmonary edema. 2.  Aortic Atherosclerosis (ICD10-I70.0). Electronically Signed   By: Narda Rutherford M.D.   On: 11/24/2018 00:01   Vas [place] Lower Extremity Saphenous Vein Mapping  Result Date: 11/30/2018 LOWER EXTREMITY VEIN MAPPING Indications:  Pre-op Risk Factors: Coronary artery disease.  Comparison Study: No prior study on file Performing Technologist: Sherren Kerns RVS  Examination Guidelines: A complete evaluation includes B-mode imaging, spectral Doppler, color Doppler, and power Doppler as needed of all accessible portions of each vessel. Bilateral testing is considered an integral part of a complete examination. Limited examinations for reoccurring indications may be performed as noted. +---------------+-----------+----------------------+---------------+-----------+    RT Diameter   RT Findings          GSV             LT Diameter   LT Findings        (cm)                                               (cm)                    +---------------+-----------+----------------------+---------------+-----------+       0.56                       Saphenofemoral          0.56  Junction                                     +---------------+-----------+----------------------+---------------+-----------+       0.52                       Proximal thigh          0.42        branching    +---------------+-----------+----------------------+---------------+-----------+       0.51        branching        Mid thigh             0.44                    +---------------+-----------+----------------------+---------------+-----------+       0.26                        Distal thigh           0.40        branching   +---------------+-----------+----------------------+---------------+-----------+       0.21                            Knee               0.40                    +---------------+-----------+----------------------+---------------+-----------+       0.23        branching        Prox calf             0.29                    +---------------+-----------+----------------------+---------------+-----------+       0.26                          Mid calf             0.38        branching   +---------------+-----------+----------------------+---------------+-----------+       0.26                        Distal calf            0.26        branching   +---------------+-----------+----------------------+---------------+-----------+       0.23        branching          Ankle               0.28                    +---------------+-----------+----------------------+---------------+-----------+ Diagnosing physician: Fabienne Bruns MD Electronically signed by Fabienne Bruns MD on 11/30/2018 at 4:14:55 PM.    Final    Vas US Doppler Pre Cabg  Result Date: 11/30/2018 PREOPERATIVE VASCULAR EVALUATION  Risk Factors:     Hypertension, coronary artery disease. Comparison Study: No prior study on file Performing Technologist: Sherren Kerns RVS  Examination Guidelines: A complete evaluation includes B-mode imaging, spectral Doppler, color Doppler, and power Doppler as needed of all accessible portions of each vessel. Bilateral testing is considered an integral part of a complete examination. Limited examinations for reoccurring  indications may be performed as noted.  Right Carotid Findings:  +----------+--------+--------+--------+------------+------------------+             PSV cm/s EDV cm/s Stenosis Describe     Comments            +----------+--------+--------+--------+------------+------------------+  CCA Prox   50       9                              intimal thickening  +----------+--------+--------+--------+------------+------------------+  CCA Distal 53       9                              intimal thickening  +----------+--------+--------+--------+------------+------------------+  ICA Prox   57       10                heterogenous                     +----------+--------+--------+--------+------------+------------------+  ICA Distal 54       12                                                 +----------+--------+--------+--------+------------+------------------+  ECA        85       12                                                 +----------+--------+--------+--------+------------+------------------+ Portions of this table do not appear on this page. +----------+--------+-------+--------+------------+             PSV cm/s EDV cms Describe Arm Pressure  +----------+--------+-------+--------+------------+  Subclavian 51                        148           +----------+--------+-------+--------+------------+ +---------+--------+--+--------+--+  Vertebral PSV cm/s 37 EDV cm/s 12  +---------+--------+--+--------+--+ Left Carotid Findings: +----------+--------+--------+--------+------------+------------------+             PSV cm/s EDV cm/s Stenosis Describe     Comments            +----------+--------+--------+--------+------------+------------------+  CCA Prox   55       12                             intimal thickening  +----------+--------+--------+--------+------------+------------------+  CCA Distal 54       13                             intimal thickening  +----------+--------+--------+--------+------------+------------------+  ICA Prox   70       18                heterogenous                      +----------+--------+--------+--------+------------+------------------+  ICA Distal 62       11                                                 +----------+--------+--------+--------+------------+------------------+  ECA        50       5                                                  +----------+--------+--------+--------+------------+------------------+ +----------+--------+--------+--------+------------+  Subclavian PSV cm/s EDV cm/s Describe Arm Pressure  +----------+--------+--------+--------+------------+             83                         151           +----------+--------+--------+--------+------------+ +---------+--------+--+--------+-+  Vertebral PSV cm/s 32 EDV cm/s 5  +---------+--------+--+--------+-+  ABI Findings: +--------+------------------+-----+---------+--------+  Right    Rt Pressure (mmHg) Index Waveform  Comment   +--------+------------------+-----+---------+--------+  Brachial 148                      triphasic           +--------+------------------+-----+---------+--------+  PTA                               triphasic           +--------+------------------+-----+---------+--------+  DP                                triphasic           +--------+------------------+-----+---------+--------+ +--------+------------------+-----+---------+-------+  Left     Lt Pressure (mmHg) Index Waveform  Comment  +--------+------------------+-----+---------+-------+  Brachial 151                      triphasic          +--------+------------------+-----+---------+-------+  PTA                               triphasic          +--------+------------------+-----+---------+-------+  DP                                triphasic          +--------+------------------+-----+---------+-------+  Right Doppler Findings: +-----------+--------+-----+---------+--------+  Site        Pressure Index Doppler   Comments  +-----------+--------+-----+---------+--------+  Brachial    148            triphasic            +-----------+--------+-----+---------+--------+  Radial                     triphasic           +-----------+--------+-----+---------+--------+  Ulnar                      triphasic           +-----------+--------+-----+---------+--------+  Palmar Arch                          WNL       +-----------+--------+-----+---------+--------+  Left Doppler Findings: +-----------+--------+-----+---------+--------+  Site        Pressure Index Doppler   Comments  +-----------+--------+-----+---------+--------+  Brachial    151            triphasic           +-----------+--------+-----+---------+--------+  Radial                     triphasic           +-----------+--------+-----+---------+--------+  Ulnar                      triphasic           +-----------+--------+-----+---------+--------+  Palmar Arch                          WNL       +-----------+--------+-----+---------+--------+  Summary: Right Carotid: The extracranial vessels were near-normal with only minimal wall                thickening or plaque. Left Carotid: The extracranial vessels were near-normal with only minimal wall               thickening or plaque. Vertebrals:  Bilateral vertebral arteries demonstrate antegrade flow. Subclavians: Normal flow hemodynamics were seen in bilateral subclavian              arteries. Right Upper Extremity: Doppler waveforms remain within normal limits with right radial compression. Doppler waveforms remain within normal limits with right ulnar compression. Left Upper Extremity: Doppler waveforms remain within normal limits with left radial compression. Doppler waveforms remain within normal limits with left ulnar compression.  Electronically signed by Fabienne Bruns MD on 11/30/2018 at 4:17:13 PM.    Final    [place] Ekg Site Rite  Result Date: 12/10/2018 If Site Rite image not attached, placement could not be confirmed due to current cardiac rhythm.        Discharge Medications: Allergies as of 12/11/2018       Reactions   Penicillins Rash, Other (See Comments)   Tolerated cefepime 12/2018 Did it involve swelling of the face/tongue/throat, SOB, or low BP? Unknown Did it involve sudden or severe rash/hives, skin peeling, or any reaction on the inside of your mouth or nose? Yes Did you need to seek medical attention at a hospital or doctor's office? Unknown When did it last happen? childhood If all above answers are "NO", may proceed with cephalosporin use.      Medication List    TAKE these medications   amiodarone 200 MG tablet Commonly known as: PACERONE Take 1 tablet (200 mg total) by mouth 2 (two) times daily. For 5 days then take Amiodarone 200 mg daily thereafter   aspirin 325 MG EC tablet Take 1 tablet (325 mg total) by mouth daily. Start taking on: December 12, 2018   ceFEPIme 2 g in sodium chloride 0.9 % 100 mL Inject 2 g into the vein every 8 (eight) hours.   feeding supplement (PRO-STAT SUGAR FREE 64) Liqd Take 30 mLs by mouth daily. Start taking on: December 12, 2018   guaiFENesin 600 MG 12 hr tablet Commonly known as: MUCINEX Take 1 tablet (600 mg total) by mouth 2 (two) times daily.   levalbuterol 0.63 MG/3ML nebulizer solution Commonly known as: XOPENEX Take 3 mLs (0.63 mg total) by nebulization 2 (two) times daily.   multivitamin with minerals Tabs tablet Take 1 tablet by mouth daily. Start taking on: December 12, 2018   rosuvastatin 40 MG tablet Commonly known as: CRESTOR Take 1 tablet (40 mg total)  by mouth daily at 6 PM.   sacubitril-valsartan 24-26 MG Commonly known as: ENTRESTO Take 1 tablet by mouth 2 (two) times daily.   spironolactone 25 MG tablet Commonly known as: ALDACTONE Take 1 tablet (25 mg total) by mouth daily. Start taking on: December 12, 2018   torsemide 20 MG tablet Commonly known as: DEMADEX Take 1 tablet (20 mg total) by mouth daily. Start taking on: December 12, 2018   traMADol 50 MG tablet Commonly known as: ULTRAM Take 1-2  tablets (50-100 mg total) by mouth every 4 (four) hours as needed for moderate pain.   vancomycin 1,750 mg in sodium chloride 0.9 % 500 mL Inject 1,750 mg into the vein daily.      The patient has been discharged on:   1.Beta Blocker:  Yes [   ]                              No   [  x ]                              If No, reason: Previous shock and pressors  2.Ace Inhibitor/ARB: Yes [  x ]                                     No  [    ]                                     If No, reason:  3.Statin:   Yes [ x  ]                  No  [   ]                  If No, reason:  4.Ecasa:  Yes  [ x  ]                  No   [   ]                  If No, reason:  Follow Up Appointments: Follow-up Information    Linden Dolin, MD. Call.   Specialty: Cardiothoracic Surgery Why: for a follow up appointment for 2 weeks after discharged from CIR. Contact information: [address] [place] [address] [phone]        Bensimhon, Bevelyn Buckles, MD. Call.   Specialty: Cardiology Why: for a follow up appointment Contact information: [address] [place] [address] [phone]        Medical Doctor. Call.   Why: Obtain a medical doctor for further surveillance of HGA1C 5.8 (pre diabetes)          Signed: Briana Newman M ZimmermanPA-C 12/11/2018, 11:35 AM

## 2018-12-08 DIAGNOSIS — R0902 Hypoxemia: Secondary | ICD-10-CM

## 2018-12-08 DIAGNOSIS — I2699 Other pulmonary embolism without acute cor pulmonale: Secondary | ICD-10-CM

## 2018-12-08 DIAGNOSIS — Z9689 Presence of other specified functional implants: Secondary | ICD-10-CM

## 2018-12-08 LAB — URINE CULTURE
Culture: NO GROWTH
Special Requests: NORMAL

## 2018-12-08 LAB — GLUCOSE, CAPILLARY
Glucose-Capillary: 100 mg/dL — ABNORMAL HIGH (ref 70–99)
Glucose-Capillary: 108 mg/dL — ABNORMAL HIGH (ref 70–99)
Glucose-Capillary: 121 mg/dL — ABNORMAL HIGH (ref 70–99)
Glucose-Capillary: 122 mg/dL — ABNORMAL HIGH (ref 70–99)
Glucose-Capillary: 128 mg/dL — ABNORMAL HIGH (ref 70–99)
Glucose-Capillary: 80 mg/dL (ref 70–99)

## 2018-12-08 LAB — BASIC METABOLIC PANEL
Anion gap: 10 (ref 5–15)
BUN: 20 mg/dL (ref 8–23)
CO2: 24 mmol/L (ref 22–32)
Calcium: 7.5 mg/dL — ABNORMAL LOW (ref 8.9–10.3)
Chloride: 99 mmol/L (ref 98–111)
Creatinine, Ser: 1.16 mg/dL (ref 0.61–1.24)
GFR calc Af Amer: >60 mL/min (ref >60)
GFR calc non Af Amer: >60 mL/min (ref >60)
Glucose, Bld: 131 mg/dL — ABNORMAL HIGH (ref 70–99)
Potassium: 3.1 mmol/L — ABNORMAL LOW (ref 3.5–5.1)
Sodium: 133 mmol/L — ABNORMAL LOW (ref 135–145)

## 2018-12-08 LAB — COOXEMETRY PANEL
Carboxyhemoglobin: 1.4 % (ref 0.5–1.5)
Methemoglobin: 0.6 % (ref 0.0–1.5)
O2 Saturation: 58.2 %
Total hemoglobin: 9.2 g/dL — ABNORMAL LOW (ref 12.0–16.0)

## 2018-12-08 LAB — CBC
HCT: 28.7 % — ABNORMAL LOW (ref 39.0–52.0)
Hemoglobin: 9 g/dL — ABNORMAL LOW (ref 13.0–17.0)
MCH: 30.4 pg (ref 26.0–34.0)
MCHC: 31.4 g/dL (ref 30.0–36.0)
MCV: 97 fL (ref 80.0–100.0)
Platelets: 229 10*3/uL (ref 150–400)
RBC: 2.96 MIL/uL — ABNORMAL LOW (ref 4.22–5.81)
RDW: 19.6 % — ABNORMAL HIGH (ref 11.5–15.5)
WBC: 17.3 10*3/uL — ABNORMAL HIGH (ref 4.0–10.5)
nRBC: 0 % (ref 0.0–0.2)

## 2018-12-08 LAB — PROCALCITONIN: Procalcitonin: 0.34 ng/mL

## 2018-12-08 LAB — MAGNESIUM: Magnesium: 2.2 mg/dL (ref 1.7–2.4)

## 2018-12-08 MED ORDER — POTASSIUM CHLORIDE CRYS ER 20 MEQ PO TBCR
20.0000 meq | EXTENDED_RELEASE_TABLET | ORAL | Status: AC
  Administered 2018-12-08 (×3): 20 meq via ORAL
  Filled 2018-12-08 (×3): qty 1

## 2018-12-08 MED ORDER — ENSURE ENLIVE PO LIQD
237.0000 mL | Freq: Two times a day (BID) | ORAL | Status: DC
  Administered 2018-12-08 – 2018-12-10 (×4): 237 mL via ORAL

## 2018-12-08 MED ORDER — DIPHENHYDRAMINE HCL 50 MG/ML IJ SOLN
25.0000 mg | Freq: Once | INTRAMUSCULAR | Status: AC
  Administered 2018-12-08: 22:00:00 25 mg via INTRAVENOUS
  Filled 2018-12-08: qty 1

## 2018-12-08 MED ORDER — FUROSEMIDE 40 MG PO TABS
40.0000 mg | ORAL_TABLET | Freq: Every day | ORAL | Status: DC
  Administered 2018-12-08: 10:00:00 40 mg via ORAL
  Filled 2018-12-08: qty 1

## 2018-12-08 MED ORDER — MILRINONE LACTATE IN DEXTROSE 20-5 MG/100ML-% IV SOLN
0.1250 ug/kg/min | INTRAVENOUS | Status: DC
  Administered 2018-12-08: 0.125 ug/kg/min via INTRAVENOUS

## 2018-12-08 MED ORDER — SPIRONOLACTONE 25 MG PO TABS
25.0000 mg | ORAL_TABLET | Freq: Every day | ORAL | Status: DC
  Administered 2018-12-08 – 2018-12-11 (×4): 25 mg via ORAL
  Filled 2018-12-08 (×4): qty 1

## 2018-12-08 NOTE — Progress Notes (Addendum)
[facility] DAILY ICU PROGRESS NOTE                   [address]            [place] [address]          [phone]   4 Days Post-Op Procedure(s) (LRB): REMOVAL OF IMPELLA LEFT VENTRICULAR ASSIST DEVICE.  EVACUATION OF LEFT HEMOTHORAX (N/A) TRANSESOPHAGEAL ECHOCARDIOGRAM (TEE) (N/A) Sternal Plating with Sternal Rewiring  Total Length of Stay:  LOS: 14 days   Subjective: Patient just waking up. He states his breathing is ok, it could be better.  Objective: Vital signs in last 24 hours: Temp:  [97.8 F (36.6 C)-98.6 F (37 C)] 97.8 F (36.6 C) (10/06 0814) Pulse Rate:  [54-118] 92 (10/06 0809) Cardiac Rhythm: Normal sinus rhythm (10/06 0400) Resp:  [16-42] 17 (10/06 0809) BP: (83-117)/(49-82) 103/55 (10/06 0703) SpO2:  [67 %-100 %] 100 % (10/06 0809) Arterial Line BP: (65-134)/(56-123) 80/74 (10/05 1300) FiO2 (%):  [40 %] 40 % (10/05 1200) Weight:  [92.2 kg] 92.2 kg (10/06 0446)  Filed Weights   12/06/18 0500 12/07/18 0500 12/08/18 0446  Weight: 92.1 kg 90.5 kg 92.2 kg    Weight change: 1.7 kg   Hemodynamic parameters for last 24 hours: CVP:  [2 mmHg-8 mmHg] 5 mmHg  Intake/Output from previous day: 10/05 0701 - 10/06 0700 In: 1970 [I.V.:1119.8; IV Piggyback:850.1] Out: 2965 [Urine:2735; Chest Tube:230]  Intake/Output this shift: Total I/O In: -  Out: 225 [Urine:175; Chest Tube:50]  Current Meds: Scheduled Meds: . aspirin EC  325 mg Oral Daily   Or  . aspirin  324 mg Per Tube Daily  . bisacodyl  10 mg Oral Daily   Or  . bisacodyl  10 mg Rectal Daily  . chlorhexidine  15 mL Mouth Rinse BID  . Chlorhexidine Gluconate Cloth  6 each Topical Daily  . docusate sodium  200 mg Oral Daily  . enoxaparin (LOVENOX) injection  40 mg Subcutaneous Q24H  . guaiFENesin  600 mg Oral BID  . insulin aspart  2-6 Units Subcutaneous Q4H  . insulin detemir  5 Units Subcutaneous Q12H  . levalbuterol  0.63 mg Nebulization BID  . mouth rinse  15 mL Mouth Rinse  [id number]  . pantoprazole  40 mg Oral Daily  . sodium chloride flush  3 mL Intravenous Q12H  . spironolactone  12.5 mg Oral Daily   Continuous Infusions: . sodium chloride Stopped (12/04/18 1251)  . sodium chloride 10 mL/hr at 12/08/18 0700  . sodium chloride 10 mL/hr at 12/08/18 0700  . amiodarone 30 mg/hr (12/08/18 0700)  . ceFEPime (MAXIPIME) IV Stopped (12/08/18 0415)  . epinephrine 1 mcg/min (12/08/18 0817)  . lactated ringers    . lactated ringers 10 mL/hr at 12/07/18 0500  . milrinone 0.125 mcg/kg/min (12/08/18 0805)  . nitroGLYCERIN Stopped (12/03/18 2300)  . norepinephrine (LEVOPHED) Adult infusion Stopped (12/06/18 0331)  . phenylephrine (NEO-SYNEPHRINE) Adult infusion Stopped (12/05/18 0748)  . vancomycin     PRN Meds:.sodium chloride, sodium chloride, hydrALAZINE, metoprolol tartrate, morphine injection, ondansetron (ZOFRAN) IV, oxyCODONE, traMADol  Neurologic: intact Heart: RRR Lungs: Slightly diminshed at bases Abdomen: Soft, non tender, bowel sounds present Extremities: Mild LE edema Wound: Staples intact on sternal wound and it is clean and dry. LLE wound is clean and dry  Lab Results: CBC: Recent Labs    12/07/18 0428  12/07/18 0943 12/08/18 0750  WBC 26.9*  --   --  17.3*  HGB  9.8*   < > 9.9* 9.0*  HCT 29.5*   < > 29.0* 28.7*  PLT 182  --   --  229   < > = values in this interval not displayed.   BMET:  Recent Labs    12/07/18 0428  12/07/18 0943 12/08/18 0750  NA 134*   < > 133* 133*  K 3.4*   < > 3.6 3.1*  CL 95*  --   --  99  CO2 27  --   --  24  GLUCOSE 162*  --   --  131*  BUN 18  --   --  20  CREATININE 1.20  --   --  1.16  CALCIUM 7.6*  --   --  7.5*   < > = values in this interval not displayed.    CMET: Lab Results  Component Value Date   WBC 17.3 (H) 12/08/2018   HGB 9.0 (L) 12/08/2018   HCT 28.7 (L) 12/08/2018   PLT 229 12/08/2018   GLUCOSE 131 (H) 12/08/2018   CHOL 233 (H) 11/25/2018   TRIG 155 (H) 11/25/2018   HDL 31 (L)  11/25/2018   LDLCALC 171 (H) 11/25/2018   ALT 19 12/07/2018   AST 18 12/07/2018   NA 133 (L) 12/08/2018   K 3.1 (L) 12/08/2018   CL 99 12/08/2018   CREATININE 1.16 12/08/2018   BUN 20 12/08/2018   CO2 24 12/08/2018   TSH 0.350 11/24/2018   INR 1.5 (H) 11/30/2018   HGBA1C 5.8 (H) 11/24/2018    PT/INR: No results for input(s): LABPROT, INR in the last 72 hours. Radiology: No results found.   Assessment/Plan: S/P Procedure(s) (LRB): REMOVAL OF IMPELLA LEFT VENTRICULAR ASSIST DEVICE.  EVACUATION OF LEFT HEMOTHORAX (N/A) TRANSESOPHAGEAL ECHOCARDIOGRAM (TEE) (N/A) Sternal Plating with Sternal Rewiring 1. CV-Impella removed 10/02. On Milrinone 0.125 mcg daily. Co ox this am decreased to 58.2. PAF so on Amiodarone drip. 2. Pulmonary-Evacuation of hemothorax 10/02 and extubated on 10/03. CT chest done yesterday showed no PE, significant effusion or infiltrate. Chest tubes with 230 last 24 hours and there is no air leak. Chest tubes are to suction. 3. Volume overload- CVP 5. Lasix 40 mg IV per Dr. Haroldine Laws 4. ABL anemia-H and H this am stable at 9 and 28.7 5. Supplement potassium 6. Renal function stable. Of note CT abd/pelvis done yesterday showed 1.9 cm rounded hyperdense abnormality is seen arising from lower pole of right kidney which may represent hyperdense cyst, but neoplasm cannot be excluded. Also,  Large nonobstructive left renal calculi  CT head-no acute pathology 7. Leukocytosis-WBC decreased to 17,300 this am. UC no growth and await blood culture results. On Vancomycin and Maxipime.  Donielle Liston Alba PA-C 12/08/2018 9:05 AM   Pt seen and examined; agree with note and plan. He needs aggressive PT/OT and nutrition in order to continue to improve.  Jenina Moening Z. Orvan Seen, [place]

## 2018-12-08 NOTE — Progress Notes (Signed)
Pt was found in chair with chest tube dressing undone, gown up, and foley catheter strap pulled off of leg. New dressing was placed and patient was placed back in bed.  Pt was disoriented x3 and confused Reoriented by nursing staff and all needs met at this time.

## 2018-12-08 NOTE — Progress Notes (Signed)
Nutrition Follow-up  DOCUMENTATION CODES:   Not applicable  INTERVENTION:   - Ensure Enlive po BID, each supplement provides 350 kcal and 20 grams of protein  NUTRITION DIAGNOSIS:   Inadequate oral intake related to inability to eat as evidenced by NPO status.  Progressing, pt now on Heart Healthy/Carb Modified diet  GOAL:   Patient will meet greater than or equal to 90% of their needs  Progressing  MONITOR:   PO intake, Supplement acceptance, Labs, Weight trends, I & O's, Skin  REASON FOR ASSESSMENT:   Ventilator    ASSESSMENT:   68 year old male who presented to the ED on 9/21 with SOB. PMH of tobacco use. Pt admitted with acute hypoxemic hypercapnic respiratory failure and elevated troponin/NSTEMI. Pt also found to have CHF.  9/22 - cath showing marked LV dysfunction and multivessel CAD 9/24 - TEE 9/28 - s/p CABG x 3, mitral valve replacement, Impella placement; s/p urgent mediastinal exploration for control of bleeding 9/30 - extubated 10/2 - Impella removed, evacuation of hemothorax 10/3 - extubated  Discussed pt with RN and during ICU rounds. Pt confused and agitated overnight.  Diet advanced to Heart Healthy/Carb Modified this morning. No meal completions recorded since 11/29/18.  Weight down a total of 4 lbs since admit. Will continue to monitor trends. Pt with mild pitting generalized edema and non-pitting edema to BUE per RN edema assessment.  RD will order an oral nutrition supplement to aid pt in meeting kcal and protein needs.  Medications reviewed and include: Dulcolax, Colace, Lasix, SSI q 4 hours, Levemir 5 units q 12 hours, Protonix, Klor-con 20 mEq q 4 hours, spironolactone, amiodarone, milrinone, IV abx IVF: NS @ 20 ml/hr  Labs reviewed: sodium 133, potassium 3.1, hemoglobin 9.0 CBG's: 108-128 x 24 hours  UOP: 2735 ml x 24 hours CT: 230 ml x 24 hours I/O's: -3.0 L since admit  Diet Order:   Diet Order            Diet heart healthy/carb  modified Room service appropriate? Yes; Fluid consistency: Thin  Diet effective now              EDUCATION NEEDS:   No education needs have been identified at this time  Skin:  Skin Assessment: Skin Integrity Issues: Skin Integrity Issues: Incisions: closed incisions to chest and left leg  Last BM:  12/07/18 large type 7  Height:   Ht Readings from Last 1 Encounters:  12/01/18 5\' 9"  (1.753 m)    Weight:   Wt Readings from Last 1 Encounters:  12/08/18 92.2 kg    Ideal Body Weight:  72.7 kg  BMI:  Body mass index is 30.02 kg/m.  Estimated Nutritional Needs:   Kcal:  2100-2300  Protein:  110-130 grams  Fluid:  >/= 1.8 L    Gaynell Face, MS, RD, LDN Inpatient Clinical Dietitian Pager: [phone] Weekend/After Hours: [phone]

## 2018-12-08 NOTE — Progress Notes (Addendum)
NAME:  Curtis [name], MRN:  [id number], DOB:  1950-08-20, LOS: 66 ADMISSION DATE:  11/23/2018, CONSULTATION DATE: 12/07/2018 REFERRING MD: Dr. Orvan Seen MD, CHIEF COMPLAINT: Respiratory failure, altered mental status  Brief History   68 year old status post CABG, mitral valve replacement developed cardiogenic shock with Impella placement.  Hospital course complicated by postop bleeding, hemothorax, hemorrhagic shock requiring OR takeback and evacuation of left hemothorax. Patient became more dyspneic 10/5 with altered mental status, elevated WBC count.  PCCM consulted for help with management.  Past Medical History  Coronary artery disease, tobacco abuse  Significant Hospital Events   9/21-admit with acute respiratory failure, CHF MI 9/22-Cardiac catheterization 9/28-CABG, mitral valve replacement. 10/2-Impella removed, evacuation of left hemothorax 10/3-extubated  Consults:  PCCM, cardiology  Procedures:  Rt IJ 9/28 Lt radial A line 9/28  Significant Diagnostic Tests:  10/5 CTA Negative for PE  Micro Data:  10/5 Blood>> 10/5 Urine >> No Growth Antimicrobials:  Vanc 10/5>> Cefepime 10/5 >>  Interim history/subjective:  Doing well. CTA negative Weaned to O2 at 2 L , sats are 100% Afebrile  Objective   Blood pressure (!) 100/56, pulse 87, temperature 97.8 F (36.6 C), temperature source Oral, resp. rate (!) 23, height 5\' 9"  (1.753 m), weight 92.2 kg, SpO2 (!) 82 %. CVP:  [2 mmHg-8 mmHg] 5 mmHg  FiO2 (%):  [40 %] 40 %   Intake/Output Summary (Last 24 hours) at 12/08/2018 0947 Last data filed at 12/08/2018 0900 Gross per 24 hour  Intake 1980.7 ml  Output 2595 ml  Net -614.3 ml   Filed Weights   12/06/18 0500 12/07/18 0500 12/08/18 0446  Weight: 92.1 kg 90.5 kg 92.2 kg    Examination: Gen:   Critically ill-appearing, on BiPAP HEENT:  EOMI, sclera anicteric Neck:     No masses; no thyromegaly Lungs:    Clear to auscultation bilaterally; normal respiratory effort CV:          Regular rate and rhythm; no murmurs Abd:      + bowel sounds; soft, non-tender; no palpable masses, no distension Ext:    No edema; adequate peripheral perfusion Skin:      Warm and dry; no rash Neuro:   Somnolent, Arousable.  I reviewed CXR myself  Resolved Hospital Problem list     Assessment & Plan:  Acute respiratory failure, BiPAP dependence CTA negative for PE CT to 20 cm H2o with no leak and 100 cc output last 24 Plan Trend Mag Weaned to 2 L [place] overnight WBC is down trending after initiation of  empiric vancomycin, cefepime Follow  cultures, procalcitonin Trend CXR, WBC and fever curve Aggressive Pulmonary Toilet  OOB to chair CT management per primary  Cardiogenic shock, MI Status post CABG, mitral valve replacement Required Impella during this admission. Management per primary team Off epi Plan Continue milrinone drip. Continue aspirin, statin. MAP goal > 65  Severe MR, paroxysmal atrial fib Status post bioprosthetic mitral valve replacement Plan Amiodarone drip. Monitor and replete  lytes EKG prn per primary team  Blood loss anemia Follow CBC. Transfuse for HGB < 7  Pt with significant improvement overnight Weaned to 2L [place], sats are 100 % PCCM will sign off, but please re-consult if we can be of any further assistance  Best practice:  Diet: NPO Pain/Anxiety/Delirium protocol (if indicated): NA VAP protocol (if indicated): NA DVT prophylaxis: Lovenox GI prophylaxis: Protonix Glucose control: SSI, Levemir Mobility: Bedrest Code Status: Full code Family Communication: Per Primary Disposition: ICU  Labs  CBC: Recent Labs  Lab 12/03/18 0416  12/04/18 1642  12/05/18 0401  12/06/18 0428  12/06/18 2135 12/07/18 0428 12/07/18 0544 12/07/18 0943 12/08/18 0750  WBC 15.7*   < > 24.5*  --  24.7*  --  18.4*  --   --  26.9*  --   --  17.3*  NEUTROABS 12.8*  --  18.9*  --   --   --   --   --   --   --   --   --   --   HGB 8.7*   < > 9.3*    < > 9.7*   < > 9.7*   < > 12.6* 9.8* 10.2* 9.9* 9.0*  HCT 24.5*   < > 26.6*   < > 28.2*   < > 28.8*   < > 37.0* 29.5* 30.0* 29.0* 28.7*  MCV 90.4   < > 89.3  --  90.1  --  91.4  --   --  93.4  --   --  97.0  PLT 62*   < > 121*  --  128*  --  127*  --   --  182  --   --  229   < > = values in this interval not displayed.    Basic Metabolic Panel: Recent Labs  Lab 12/02/18 1457  12/03/18 0416  12/04/18 1642 12/04/18 1646  12/05/18 0401  12/06/18 0428  12/06/18 2135 12/07/18 0428 12/07/18 0544 12/07/18 0943 12/08/18 0343 12/08/18 0750  NA 138   < > 134*   < > 132* 134*   < > 132*   < > 133*   < > 133* 134* 134* 133*  --  133*  K 3.6   < > 3.7   < > 3.5 3.5   < > 3.8   < > 3.2*   < > 3.7 3.4* 3.5 3.6  --  3.1*  CL 104  --  103   < > 101  --   --  104  --  97*  --   --  95*  --   --   --  99  CO2 24  --  24   < > 23  --   --  19*  --  26  --   --  27  --   --   --  24  GLUCOSE 125*  --  153*   < > 117* 116*  --  131*  --  140*  --   --  162*  --   --   --  131*  BUN 18  --  19   < > 18  --   --  17  --  17  --   --  18  --   --   --  20  CREATININE 1.38*  --  1.24   < > 1.11  --   --  1.23  --  1.16  --   --  1.20  --   --   --  1.16  CALCIUM 7.7*  --  7.5*   < > 7.5*  --   --  7.2*  --  7.6*  --   --  7.6*  --   --   --  7.5*  MG 1.7  --  2.0  --   --   --   --   --   --  1.9  --   --  1.9  --   --  2.2  --   PHOS  --   --  3.6  --   --   --   --   --   --   --   --   --   --   --   --   --   --    < > = values in this interval not displayed.   GFR: Estimated Creatinine Clearance: 71.2 mL/min (by C-G formula based on SCr of 1.16 mg/dL). Recent Labs  Lab 12/05/18 0401 12/06/18 0428 12/07/18 0428 12/07/18 1005 12/08/18 0343 12/08/18 0750  PROCALCITON  --   --   --  0.45 0.34  --   WBC 24.7* 18.4* 26.9*  --   --  17.3*    Liver Function Tests: Recent Labs  Lab 12/06/18 0428 12/07/18 0428  AST 19 18  ALT 21 19  ALKPHOS 58 63  BILITOT 3.4* 2.6*  PROT 5.0* 5.0*   ALBUMIN 2.4* 2.3*   No results for input(s): LIPASE, AMYLASE in the last 168 hours. No results for input(s): AMMONIA in the last 168 hours.  ABG    Component Value Date/Time   PHART 7.433 12/07/2018 0943   PCO2ART 40.2 12/07/2018 0943   PO2ART 94.0 12/07/2018 0943   HCO3 26.9 12/07/2018 0943   TCO2 28 12/07/2018 0943   ACIDBASEDEF 2.0 12/05/2018 0420   O2SAT 58.2 12/08/2018 0455     Coagulation Profile: No results for input(s): INR, PROTIME in the last 168 hours.  Cardiac Enzymes: No results for input(s): CKTOTAL, CKMB, CKMBINDEX, TROPONINI in the last 168 hours.  HbA1C: Hgb A1c MFr Bld  Date/Time Value Ref Range Status  11/24/2018 03:37 AM 5.8 (H) 4.8 - 5.6 % Final    Comment:    (NOTE) Pre diabetes:          5.7%-6.4% Diabetes:              >6.4% Glycemic control for   <7.0% adults with diabetes     CBG:   Past Medical History  He,  has a past medical history of Coronary artery disease, Elevated troponin (11/25/2018), and Tobacco use.   Surgical History    Past Surgical History:  Procedure Laterality Date  . CORONARY ARTERY BYPASS GRAFT N/A 11/30/2018   Procedure: MEDIASTINAL POST-OP BLEED;  Surgeon: Linden Dolin, MD;  Location: [facility] OR;  Service: Open Heart Surgery;  Laterality: N/A;  . CORONARY ARTERY BYPASS GRAFT N/A 11/30/2018   Procedure: CORONARY ARTERY BYPASS GRAFTING (CABG) x Three , using left internal mammary artery and left leg greater saphenous vein harvested endoscopically;  Surgeon: Linden Dolin, MD;  Location: [facility] OR;  Service: Open Heart Surgery;  Laterality: N/A;  . LEFT HEART CATH AND CORONARY ANGIOGRAPHY N/A 11/24/2018   Procedure: LEFT HEART CATH AND CORONARY ANGIOGRAPHY;  Surgeon: Lyn Records, MD;  Location: [facility] INVASIVE CV LAB;  Service: Cardiovascular;  Laterality: N/A;  . MITRAL VALVE REPAIR N/A 11/30/2018   Procedure: MITRAL VALVE REPLACEMENT (MVR) USING MAGNA EASE SIZE 27mm;  Surgeon: Linden Dolin, MD;  Location: [facility] OR;   Service: Open Heart Surgery;  Laterality: N/A;  . PLACEMENT OF IMPELLA LEFT VENTRICULAR ASSIST DEVICE N/A 11/30/2018   Procedure: PLACEMENT OF IMPELLA LEFT VENTRICULAR ASSIST DEVICE;  Surgeon: Linden Dolin, MD;  Location: [facility] OR;  Service: Open Heart Surgery;  Laterality: N/A;  . REMOVAL OF IMPELLA LEFT VENTRICULAR ASSIST DEVICE N/A 12/04/2018   Procedure: REMOVAL OF  IMPELLA LEFT VENTRICULAR ASSIST DEVICE.  EVACUATION OF LEFT HEMOTHORAX;  Surgeon: Linden Dolin, MD;  Location: [facility] OR;  Service: Open Heart Surgery;  Laterality: N/A;  . STERNAL INCISION RECLOSURE  12/04/2018   Procedure: Sternal Plating with Sternal Rewiring;  Surgeon: Linden Dolin, MD;  Location: [facility] OR;  Service: Open Heart Surgery;;  . TEE WITHOUT CARDIOVERSION N/A 11/26/2018   Procedure: TRANSESOPHAGEAL ECHOCARDIOGRAM (TEE);  Surgeon: Jake Bathe, MD;  Location[facility] ENDOSCOPY;  Service: Cardiovascular;  Laterality: N/A;  . TEE WITHOUT CARDIOVERSION N/A 11/30/2018   Procedure: TRANSESOPHAGEAL ECHOCARDIOGRAM (TEE);  Surgeon: Linden Dolin, MD;  Location: [facility] OR;  Service: Open Heart Surgery;  Laterality: N/A;  . TEE WITHOUT CARDIOVERSION N/A 12/04/2018   Procedure: TRANSESOPHAGEAL ECHOCARDIOGRAM (TEE);  Surgeon: Linden Dolin, MD;  Location: [facility] OR;  Service: Open Heart Surgery;  Laterality: N/A;     Social History   reports that he has been smoking cigarettes. He has been smoking about 0.25 packs per day. He has never used smokeless tobacco. He reports that he does not drink alcohol or use drugs.   Family History   His family history includes Heart attack in his father and mother.   Allergies Allergies  Allergen Reactions  . Penicillins Rash and Other (See Comments)    Did it involve swelling of the face/tongue/throat, SOB, or low BP? Unknown Did it involve sudden or severe rash/hives, skin peeling, or any reaction on the inside of your mouth or nose? Yes Did you need to seek medical attention at a hospital  or doctor's office? Unknown When did it last happen? childhood If all above answers are "NO", may proceed with cephalosporin use.      Home Medications  Prior to Admission medications   Not on File     Critical care APP  time: 20 minutes    Bevelyn Ngo, MSN, AGACNP-BC [facility] Pulmonary/Critical Care Medicine Pager # [phone] After 4 pm please call [phone] 12/08/2018, 9:47 AM  Attending Note:  68 year old male s/p CABG and MVR who developed cardiogenic shock.  PCCM was consulted for hypoxemia and PE.  On exam, lungs with bibasilar crackles.  I reviewed chest CT myself, no PE noted.  Discussed with PCCM-NP.  PE:  - None exists  - Anticoagulation for other reasons ok  Hypoxemia: due to pulmonary edema, currently on 2L [place]  - Titrate O2 for sat of 88-92%  - Will need an ambulatory desaturation study prior to discharge for ?of home O2  Pulmonary edema:  - Diureses as hemodynamics allow  Hypotension:  - Milrinone  - KVO IVF  PCCM will sign off, please call back if needed.  Patient seen and examined, agree with above note.  I dictated the care and orders written for this patient under my direction.  Alyson Reedy, MD [phone]

## 2018-12-08 NOTE — Progress Notes (Addendum)
Patient ID: Curtis [name], male   DOB: 28-Mar-1950, 68 y.o.   MRN: [id number]    Advanced Heart Failure Rounding Note   Subjective:    Impella removed + evacuation of hemothorax 10/2.  Extubated and Swan removed 10/3.   Yesterday milrinone cut back to 0.25 mcg. CO-OX 58%. Overnight he was confused.   Denies SOB. Drowsy this morning. Cooperative.   Objective:   Weight Range:  Vital Signs:   Temp:  [97.8 F (36.6 C)-98.6 F (37 C)] 98.3 F (36.8 C) (10/06 0400) Pulse Rate:  [54-118] 92 (10/06 0703) Resp:  [16-42] 23 (10/06 0703) BP: (83-119)/(49-82) 103/55 (10/06 0703) SpO2:  [67 %-100 %] 98 % (10/06 0703) Arterial Line BP: (43-134)/(25-123) 80/74 (10/05 1300) FiO2 (%):  [40 %] 40 % (10/05 1200) Weight:  [92.2 kg] 92.2 kg (10/06 0446) Last BM Date: 12/04/18  Weight change: Filed Weights   12/06/18 0500 12/07/18 0500 12/08/18 0446  Weight: 92.1 kg 90.5 kg 92.2 kg    Intake/Output:   Intake/Output Summary (Last 24 hours) at 12/08/2018 0749 Last data filed at 12/08/2018 0700 Gross per 24 hour  Intake 1969.97 ml  Output 2965 ml  Net -995.03 ml    CVP 1-2 personally checked.  Physical Exam: General:   No resp difficulty. In bed  HEENT: normal Neck: supple. no JVD. Carotids 2+ bilat; no bruits. No lymphadenopathy or thryomegaly appreciated. Cor: PMI nondisplaced. Regular rate & rhythm. No rubs, gallops or murmurs. Sternal incision approximated.  Lungs: clear on 2 liters  Abdomen: soft, nontender, nondistended. No hepatosplenomegaly. No bruits or masses. Good bowel sounds. Extremities: no cyanosis, clubbing, rash, edema Neuro: alert & orientedx3, cranial nerves grossly intact. moves all 4 extremities w/o difficulty. Affect flat   Telemetry: SR with occasional PVCs.  Labs: Basic Metabolic Panel: Recent Labs  Lab 12/02/18 1457  12/03/18 0416  12/04/18 0428  12/04/18 1426  12/04/18 1642 12/04/18 1646  12/05/18 0401  12/06/18 0428 12/06/18 1949 12/06/18 2135  12/07/18 0428 12/07/18 0544 12/07/18 0943 12/08/18 0343  NA 138   < > 134*   < > 134*   < > 133*   < > 132* 134*   < > 132*   < > 133* 133* 133* 134* 134* 133*  --   K 3.6   < > 3.7   < > 2.9*   < > 3.7   < > 3.5 3.5   < > 3.8   < > 3.2* 3.7 3.7 3.4* 3.5 3.6  --   CL 104  --  103  --  98  --  96*  --  101  --   --  104  --  97*  --   --  95*  --   --   --   CO2 24  --  24  --  25  --   --   --  23  --   --  19*  --  26  --   --  27  --   --   --   GLUCOSE 125*  --  153*  --  125*   < > 123*  --  117* 116*  --  131*  --  140*  --   --  162*  --   --   --   BUN 18  --  19  --  21  --  20  --  18  --   --  17  --  17  --   --  18  --   --   --   CREATININE 1.38*  --  1.24  --  1.27*  --  1.10  --  1.11  --   --  1.23  --  1.16  --   --  1.20  --   --   --   CALCIUM 7.7*  --  7.5*  --  7.5*  --   --   --  7.5*  --   --  7.2*  --  7.6*  --   --  7.6*  --   --   --   MG 1.7  --  2.0  --   --   --   --   --   --   --   --   --   --  1.9  --   --  1.9  --   --  2.2  PHOS  --   --  3.6  --   --   --   --   --   --   --   --   --   --   --   --   --   --   --   --   --    < > = values in this interval not displayed.    Liver Function Tests: Recent Labs  Lab 12/06/18 0428 12/07/18 0428  AST 19 18  ALT 21 19  ALKPHOS 58 63  BILITOT 3.4* 2.6*  PROT 5.0* 5.0*  ALBUMIN 2.4* 2.3*   No results for input(s): LIPASE, AMYLASE in the last 168 hours. No results for input(s): AMMONIA in the last 168 hours.  CBC: Recent Labs  Lab 12/03/18 0416  12/04/18 0433  12/04/18 1642  12/05/18 0401  12/06/18 0428 12/06/18 1949 12/06/18 2135 12/07/18 0428 12/07/18 0544 12/07/18 0943  WBC 15.7*  --  28.8*  --  24.5*  --  24.7*  --  18.4*  --   --  26.9*  --   --   NEUTROABS 12.8*  --   --   --  18.9*  --   --   --   --   --   --   --   --   --   HGB 8.7*   < > 8.5*   < > 9.3*   < > 9.7*   < > 9.7* 10.9* 12.6* 9.8* 10.2* 9.9*  HCT 24.5*   < > 24.1*   < > 26.6*   < > 28.2*   < > 28.8* 32.0* 37.0* 29.5*  30.0* 29.0*  MCV 90.4  --  91.3  --  89.3  --  90.1  --  91.4  --   --  93.4  --   --   PLT 62*  --  144*  --  121*  --  128*  --  127*  --   --  182  --   --    < > = values in this interval not displayed.    Cardiac Enzymes: No results for input(s): CKTOTAL, CKMB, CKMBINDEX, TROPONINI in the last 168 hours.  BNP: BNP (last 3 results) Recent Labs    11/23/18 2328  BNP 507.9*    ProBNP (last 3 results) No results for input(s): PROBNP in the last 8760 hours.    Other results:  Imaging: Ct Head Wo Contrast  Result Date: 12/07/2018 CLINICAL DATA:  Altered level of consciousness. EXAM: CT HEAD WITHOUT CONTRAST  TECHNIQUE: Contiguous axial images were obtained from the base of the skull through the vertex without intravenous contrast. COMPARISON:  None. FINDINGS: Brain: No evidence of acute infarction, hemorrhage, extra-axial collection, ventriculomegaly, or mass effect. Small left basal ganglia lacunar infarct. Generalized cerebral atrophy. Periventricular white matter low attenuation likely secondary to microangiopathy. Vascular: Cerebrovascular atherosclerotic calcifications are noted. Skull: Negative for fracture or focal lesion. Sinuses/Orbits: Visualized portions of the orbits are unremarkable. Mastoid sinuses are clear. Mucous retention cyst in bilateral maxillary sinuses. Other: None. IMPRESSION: 1. No acute intracranial pathology. 2. Chronic microvascular disease and cerebral atrophy. Electronically Signed   By: Elige Ko   On: 12/07/2018 09:06   Ct Angio Chest Pe W Or Wo Contrast  Result Date: 12/07/2018 CLINICAL DATA:  Hypoxia, abdominal distention. EXAM: CT ANGIOGRAPHY CHEST CT ABDOMEN AND PELVIS WITH CONTRAST TECHNIQUE: Multidetector CT imaging of the chest was performed using the standard protocol during bolus administration of intravenous contrast. Multiplanar CT image reconstructions and MIPs were obtained to evaluate the vascular anatomy. Multidetector CT imaging of the  abdomen and pelvis was performed using the standard protocol during bolus administration of intravenous contrast. CONTRAST:  OMNIPAQUE IOHEXOL 350 MG/ML SOLN COMPARISON:  CT scan of November 27, 2018. FINDINGS: CTA CHEST FINDINGS Cardiovascular: Satisfactory opacification of the pulmonary arteries to the segmental level. No evidence of pulmonary embolism. Mild cardiomegaly is noted. Atherosclerosis of thoracic aorta is noted without aneurysm formation. Status post mitral valve repair. Coronary artery calcifications are noted. No pericardial effusion. Mediastinum/Nodes: The esophagus is unremarkable. No significant adenopathy is noted. Thyroid gland is unremarkable. Mediastinal drain is noted anteriorly. Lungs/Pleura: Bilateral chest tubes are noted without definite pneumothorax. Mild bilateral lower lobe subsegmental atelectasis is noted. Musculoskeletal: Sternotomy wires are noted. No other significant osseous abnormality is noted. Review of the MIP images confirms the above findings. CT ABDOMEN and PELVIS FINDINGS Hepatobiliary: No focal liver abnormality is seen. No gallstones, gallbladder wall thickening, or biliary dilatation. Pancreas: Unremarkable. No pancreatic ductal dilatation or surrounding inflammatory changes. Spleen: Normal in size without focal abnormality. Adrenals/Urinary Tract: Adrenal glands appear normal. 13 mm calculus is noted in upper pole collecting system of left kidney. 8 mm calculus is also noted in upper pole of left kidney. Smaller nonobstructive calculus is seen in lower pole collecting system of right kidney. 1.9 cm rounded hyperdense abnormality is seen arising from lower pole right kidney. Large left parapelvic cysts are noted. No definite hydronephrosis or renal obstruction is noted. Urinary bladder catheter is noted. Stomach/Bowel: Stomach is within normal limits. Appendix appears normal. No evidence of bowel wall thickening, distention, or inflammatory changes.  Vascular/Lymphatic: Aortic atherosclerosis. No enlarged abdominal or pelvic lymph nodes. Reproductive: Mild prostatic enlargement is noted. Other: No abdominal wall hernia or abnormality. No abdominopelvic ascites. Musculoskeletal: No acute or significant osseous findings. Review of the MIP images confirms the above findings. IMPRESSION: No definite evidence of pulmonary embolus. Bilateral chest tubes are noted without pneumothorax. Mild bilateral posterior basilar subsegmental atelectasis is noted. 1.9 cm rounded hyperdense abnormality is seen arising from lower pole of right kidney which may represent hyperdense cyst, but neoplasm cannot be excluded. Ultrasound is recommended for further evaluation. Large nonobstructive left renal calculi are noted. Large left renal pelvic cysts are noted. Mild prostatic enlargement. Aortic Atherosclerosis (ICD10-I70.0). Electronically Signed   By: Lupita Raider M.D.   On: 12/07/2018 09:15   Ct Abdomen Pelvis W Contrast  Result Date: 12/07/2018 CLINICAL DATA:  Hypoxia, abdominal distention. EXAM: CT ANGIOGRAPHY CHEST CT ABDOMEN  AND PELVIS WITH CONTRAST TECHNIQUE: Multidetector CT imaging of the chest was performed using the standard protocol during bolus administration of intravenous contrast. Multiplanar CT image reconstructions and MIPs were obtained to evaluate the vascular anatomy. Multidetector CT imaging of the abdomen and pelvis was performed using the standard protocol during bolus administration of intravenous contrast. CONTRAST:  OMNIPAQUE IOHEXOL 350 MG/ML SOLN COMPARISON:  CT scan of November 27, 2018. FINDINGS: CTA CHEST FINDINGS Cardiovascular: Satisfactory opacification of the pulmonary arteries to the segmental level. No evidence of pulmonary embolism. Mild cardiomegaly is noted. Atherosclerosis of thoracic aorta is noted without aneurysm formation. Status post mitral valve repair. Coronary artery calcifications are noted. No pericardial effusion.  Mediastinum/Nodes: The esophagus is unremarkable. No significant adenopathy is noted. Thyroid gland is unremarkable. Mediastinal drain is noted anteriorly. Lungs/Pleura: Bilateral chest tubes are noted without definite pneumothorax. Mild bilateral lower lobe subsegmental atelectasis is noted. Musculoskeletal: Sternotomy wires are noted. No other significant osseous abnormality is noted. Review of the MIP images confirms the above findings. CT ABDOMEN and PELVIS FINDINGS Hepatobiliary: No focal liver abnormality is seen. No gallstones, gallbladder wall thickening, or biliary dilatation. Pancreas: Unremarkable. No pancreatic ductal dilatation or surrounding inflammatory changes. Spleen: Normal in size without focal abnormality. Adrenals/Urinary Tract: Adrenal glands appear normal. 13 mm calculus is noted in upper pole collecting system of left kidney. 8 mm calculus is also noted in upper pole of left kidney. Smaller nonobstructive calculus is seen in lower pole collecting system of right kidney. 1.9 cm rounded hyperdense abnormality is seen arising from lower pole right kidney. Large left parapelvic cysts are noted. No definite hydronephrosis or renal obstruction is noted. Urinary bladder catheter is noted. Stomach/Bowel: Stomach is within normal limits. Appendix appears normal. No evidence of bowel wall thickening, distention, or inflammatory changes. Vascular/Lymphatic: Aortic atherosclerosis. No enlarged abdominal or pelvic lymph nodes. Reproductive: Mild prostatic enlargement is noted. Other: No abdominal wall hernia or abnormality. No abdominopelvic ascites. Musculoskeletal: No acute or significant osseous findings. Review of the MIP images confirms the above findings. IMPRESSION: No definite evidence of pulmonary embolus. Bilateral chest tubes are noted without pneumothorax. Mild bilateral posterior basilar subsegmental atelectasis is noted. 1.9 cm rounded hyperdense abnormality is seen arising from lower pole  of right kidney which may represent hyperdense cyst, but neoplasm cannot be excluded. Ultrasound is recommended for further evaluation. Large nonobstructive left renal calculi are noted. Large left renal pelvic cysts are noted. Mild prostatic enlargement. Aortic Atherosclerosis (ICD10-I70.0). Electronically Signed   By: Lupita Raider M.D.   On: 12/07/2018 09:15   Dg Chest Port 1 View  Result Date: 12/07/2018 CLINICAL DATA:  Status post cardiac surgery EXAM: PORTABLE CHEST 1 VIEW COMPARISON:  12/06/2018 FINDINGS: Cardiac shadow is mildly enlarged but stable. Postsurgical changes are again seen. Right jugular sheath is again noted and stable. Bilateral thoracostomy catheters and mediastinal drain are seen. No pneumothorax is noted. Improved aeration is noted in the left base. IMPRESSION: Tubes and lines as described above. No focal infiltrate is noted. Electronically Signed   By: Alcide Clever M.D.   On: 12/07/2018 07:45     Medications:     Scheduled Medications:  aspirin EC  325 mg Oral Daily   Or   aspirin  324 mg Per Tube Daily   bisacodyl  10 mg Oral Daily   Or   bisacodyl  10 mg Rectal Daily   chlorhexidine  15 mL Mouth Rinse BID   Chlorhexidine Gluconate Cloth  6 each Topical Daily  docusate sodium  200 mg Oral Daily   enoxaparin (LOVENOX) injection  40 mg Subcutaneous Q24H   furosemide  60 mg Intravenous BID   guaiFENesin  600 mg Oral BID   insulin aspart  2-6 Units Subcutaneous Q4H   insulin detemir  5 Units Subcutaneous Q12H   levalbuterol  0.63 mg Nebulization BID   mouth rinse  15 mL Mouth Rinse q12n4p   pantoprazole  40 mg Oral Daily   sodium chloride flush  3 mL Intravenous Q12H   spironolactone  12.5 mg Oral Daily    Infusions:  sodium chloride Stopped (12/04/18 1251)   sodium chloride 10 mL/hr at 12/08/18 0700   sodium chloride 10 mL/hr at 12/08/18 0700   amiodarone 30 mg/hr (12/08/18 0700)   ceFEPime (MAXIPIME) IV Stopped (12/08/18 0415)     epinephrine 2 mcg/min (12/08/18 0700)   lactated ringers     lactated ringers 10 mL/hr at 12/07/18 0500   milrinone 0.25 mcg/kg/min (12/08/18 0700)   nitroGLYCERIN Stopped (12/03/18 2300)   norepinephrine (LEVOPHED) Adult infusion Stopped (12/06/18 0331)   phenylephrine (NEO-SYNEPHRINE) Adult infusion Stopped (12/05/18 0748)   vancomycin      PRN Medications: sodium chloride, sodium chloride, hydrALAZINE, metoprolol tartrate, morphine injection, ondansetron (ZOFRAN) IV, oxyCODONE, traMADol   Assessment/Plan:   1. Post-cardiotomy cardiogenic shock in setting if severe iCM and post-operative bleeding/hemorrhagic shock - pre-op echo EF 25-30% - s/p CABG & bioprosthetic MVR with Impella placement on 9/28 c/b severe coagulopathy/bleeding requiring OR takeback  - On 10/2 Impella removal and evacuation of left hemothorax - CVP low. Hold diuretics.  - CO-OX 58%. Cut milrinone to 0.125. On Epi 2 mcg. Hopefully can come off.   2. Acute hypoxemic respiratory failure - Extubated 9/30 - Evacuation hemothorax in OR 10/2 and re-intubated.  - Extubated 10/3.   - CT negative for PE or significant effusion/infiltrate - On 2 liters [place]. Sats stable today.   3. Severe MR - s/p bioprosthetic MVR. Stable   4. Acute blood loss anemia - transfuse to keep hgb >= 8.0 - Pending   5. CAD with NSTEMI - s/p CABG 9/28 - on ASA/statin - no s/s ischemia - b-blocker and Plavix when ready  6. Thrombocytopenia - resolved. CBC pending.   7. PAF - back in NSR with amio gtt, continue gtt while on inotropes/pressors.   8. DVT prophylaxis - Lovenox.   9. Leukocytosis - cultures drawn - PCT 0.45  - empiric vanc /cefipime for now  Length of Stay: 14   Amy Clegg NP-C  12/08/2018, 7:49 AM  Advanced Heart Failure Team Pager [phone] (M-F; 7a - 4p)  Please contact [facility] for night-coverage after hours (4p -7a ) and weekends on [email or web address]  Agree with above.   Less tenuous  today. Remains on milrinone 0.25 and epi2. Co-ox 58% CVP 5-6. Off BIPAP. Breathing better. WBC 27 -> 17 on vanc/cefipime. PCT 0.34. Weight still up about 10 pounds. Remains in NSR on amio gtt.   General:  Sitting up in bed . No resp difficulty HEENT: normal Neck: supple. JVP 5-6. Carotids 2+ bilat; no bruits. No lymphadenopathy or thryomegaly appreciated. Cor: sternal wound ok PMI nondisplaced. Regular rate & rhythm. No rubs, gallops or murmurs. Lungs: coarse with shallow breathing  Abdomen: soft, nontender, nondistended. No hepatosplenomegaly. No bruits or masses. Good bowel sounds. Extremities: no cyanosis, clubbing, rash, edema Neuro: alert & orientedx3, cranial nerves grossly intact. moves all 4 extremities w/o difficulty. Affect pleasant   Improving slowly but still  tenuous. Cut milrinone to 0.125. Wean epi. Diurese gently with po diuretics. Supp K. Would continue IV abx for at least 5 days. Mobilize.   CRITICAL CARE Performed by: Arvilla Meres  Total critical care time: 35 minutes  Critical care time was exclusive of separately billable procedures and treating other patients.  Critical care was necessary to treat or prevent imminent or life-threatening deterioration.  Critical care was time spent personally by me (independent of midlevel providers or residents) on the following activities: development of treatment plan with patient and/or surrogate as well as nursing, discussions with consultants, evaluation of patient's response to treatment, examination of patient, obtaining history from patient or surrogate, ordering and performing treatments and interventions, ordering and review of laboratory studies, ordering and review of radiographic studies, pulse oximetry and re-evaluation of patient's condition.  Arvilla Meres, MD  9:06 AM

## 2018-12-08 NOTE — Progress Notes (Addendum)
Pt got confused and ripped off cardiac monitor, pulse ox, PIV, ID arm band, blood bank bracelet, and was reaching for central line. Reoriented patient. Confused to time situation and place. Safety mitts placed on patient and explained why they were being placed. Shortly after, patient was seen trying to undo velcro with teeth. Pt agitated.   Redressed central line dressing in sterile fashion. Attempted to calm and reorient patient. New ID bracelet placed on patient.   Patient managed to get mitts off, though applied properly. Removed ID bracelet again. Pt also adamantly refuses to allow staff to replace mitts. Attempts made by this RN and additional RN. When asked why he wouldn't allow [place] to put the mitts back on, pt said "if you don't want to go to jail for murder, I wouldn't touch me."   Explained to patient that his attitude was unacceptable and wouldn't be tolerated. Offered distraction with TV and phone. Pt currently trying to contact family.

## 2018-12-09 ENCOUNTER — Inpatient Hospital Stay (HOSPITAL_COMMUNITY): Payer: Medicare Other

## 2018-12-09 LAB — CBC WITH DIFFERENTIAL/PLATELET
Abs Immature Granulocytes: 0.49 10*3/uL — ABNORMAL HIGH (ref 0.00–0.07)
Basophils Absolute: 0.1 10*3/uL (ref 0.0–0.1)
Basophils Relative: 1 %
Eosinophils Absolute: 0.4 10*3/uL (ref 0.0–0.5)
Eosinophils Relative: 3 %
HCT: 29.4 % — ABNORMAL LOW (ref 39.0–52.0)
Hemoglobin: 9.2 g/dL — ABNORMAL LOW (ref 13.0–17.0)
Immature Granulocytes: 4 %
Lymphocytes Relative: 8 %
Lymphs Abs: 1 10*3/uL (ref 0.7–4.0)
MCH: 30.2 pg (ref 26.0–34.0)
MCHC: 31.3 g/dL (ref 30.0–36.0)
MCV: 96.4 fL (ref 80.0–100.0)
Monocytes Absolute: 0.9 10*3/uL (ref 0.1–1.0)
Monocytes Relative: 7 %
Neutro Abs: 9.9 10*3/uL — ABNORMAL HIGH (ref 1.7–7.7)
Neutrophils Relative %: 77 %
Platelets: 262 10*3/uL (ref 150–400)
RBC: 3.05 MIL/uL — ABNORMAL LOW (ref 4.22–5.81)
RDW: 19.3 % — ABNORMAL HIGH (ref 11.5–15.5)
WBC: 12.8 10*3/uL — ABNORMAL HIGH (ref 4.0–10.5)
nRBC: 0 % (ref 0.0–0.2)

## 2018-12-09 LAB — GLUCOSE, CAPILLARY
Glucose-Capillary: 101 mg/dL — ABNORMAL HIGH (ref 70–99)
Glucose-Capillary: 107 mg/dL — ABNORMAL HIGH (ref 70–99)
Glucose-Capillary: 107 mg/dL — ABNORMAL HIGH (ref 70–99)
Glucose-Capillary: 92 mg/dL (ref 70–99)
Glucose-Capillary: 95 mg/dL (ref 70–99)
Glucose-Capillary: 99 mg/dL (ref 70–99)

## 2018-12-09 LAB — COOXEMETRY PANEL
Carboxyhemoglobin: 1.7 % — ABNORMAL HIGH (ref 0.5–1.5)
Methemoglobin: 0.9 % (ref 0.0–1.5)
O2 Saturation: 61.6 %
Total hemoglobin: 8.3 g/dL — ABNORMAL LOW (ref 12.0–16.0)

## 2018-12-09 LAB — BASIC METABOLIC PANEL
Anion gap: 9 (ref 5–15)
BUN: 18 mg/dL (ref 8–23)
CO2: 21 mmol/L — ABNORMAL LOW (ref 22–32)
Calcium: 7.4 mg/dL — ABNORMAL LOW (ref 8.9–10.3)
Chloride: 102 mmol/L (ref 98–111)
Creatinine, Ser: 1.13 mg/dL (ref 0.61–1.24)
GFR calc Af Amer: >60 mL/min (ref >60)
GFR calc non Af Amer: >60 mL/min (ref >60)
Glucose, Bld: 96 mg/dL (ref 70–99)
Potassium: 3.1 mmol/L — ABNORMAL LOW (ref 3.5–5.1)
Sodium: 132 mmol/L — ABNORMAL LOW (ref 135–145)

## 2018-12-09 LAB — MAGNESIUM: Magnesium: 2.1 mg/dL (ref 1.7–2.4)

## 2018-12-09 LAB — PROCALCITONIN: Procalcitonin: 0.21 ng/mL

## 2018-12-09 MED ORDER — LOSARTAN POTASSIUM 25 MG PO TABS
12.5000 mg | ORAL_TABLET | Freq: Every day | ORAL | Status: DC
  Administered 2018-12-09 – 2018-12-10 (×2): 12.5 mg via ORAL
  Filled 2018-12-09 (×2): qty 1

## 2018-12-09 MED ORDER — POTASSIUM CHLORIDE 10 MEQ/50ML IV SOLN
10.0000 meq | INTRAVENOUS | Status: AC
  Administered 2018-12-09 (×3): 10 meq via INTRAVENOUS
  Filled 2018-12-09 (×3): qty 50

## 2018-12-09 MED ORDER — AMIODARONE HCL 200 MG PO TABS
200.0000 mg | ORAL_TABLET | Freq: Two times a day (BID) | ORAL | Status: DC
  Administered 2018-12-09 – 2018-12-11 (×5): 200 mg via ORAL
  Filled 2018-12-09 (×5): qty 1

## 2018-12-09 MED ORDER — POTASSIUM CHLORIDE CRYS ER 20 MEQ PO TBCR
20.0000 meq | EXTENDED_RELEASE_TABLET | ORAL | Status: AC
  Administered 2018-12-09 (×3): 20 meq via ORAL
  Filled 2018-12-09 (×3): qty 1

## 2018-12-09 MED ORDER — TORSEMIDE 20 MG PO TABS
40.0000 mg | ORAL_TABLET | Freq: Every day | ORAL | Status: DC
  Administered 2018-12-09 – 2018-12-10 (×2): 40 mg via ORAL
  Filled 2018-12-09 (×2): qty 2

## 2018-12-09 NOTE — Progress Notes (Signed)
OT Cancellation Note  Patient Details Name: Curtis [name] MRN: [id number] DOB: 09-24-1950   Cancelled Treatment:    Reason Eval/Treat Not Completed: Patient at procedure or test/ unavailable(getting chest tubes out and on BR for an hour). Will check back as schedule allows  Jaci Carrel 12/09/2018, 11:25 AM   Hulda Humphrey OTR/L [place] Pager: [phone] Office: [phone]

## 2018-12-09 NOTE — Progress Notes (Signed)
EVENING ROUNDS NOTE :     [address]       [place] [address]             [phone]                 5 Days Post-Op Procedure(s) (LRB): REMOVAL OF IMPELLA LEFT VENTRICULAR ASSIST DEVICE.  EVACUATION OF LEFT HEMOTHORAX (N/A) TRANSESOPHAGEAL ECHOCARDIOGRAM (TEE) (N/A) Sternal Plating with Sternal Rewiring   Total Length of Stay:  LOS: 15 days  Events:  No events Resting comfortably Awaiting tele bed    BP 122/69   Pulse 85   Temp 97.6 F (36.4 C) (Oral)   Resp (!) 24   Ht 5\' 9"  (1.753 m)   Wt 91.7 kg   SpO2 100%   BMI 29.85 kg/m   CVP:  [4 mmHg-7 mmHg] 7 mmHg     . sodium chloride Stopped (12/04/18 1251)  . sodium chloride 10 mL/hr at 12/09/18 1600  . sodium chloride Stopped (12/09/18 0515)  . ceFEPime (MAXIPIME) IV Stopped (12/09/18 1118)  . epinephrine Stopped (12/08/18 0955)  . lactated ringers    . lactated ringers 10 mL/hr at 12/07/18 0500  . nitroGLYCERIN Stopped (12/03/18 2300)  . norepinephrine (LEVOPHED) Adult infusion Stopped (12/06/18 0331)  . phenylephrine (NEO-SYNEPHRINE) Adult infusion Stopped (12/05/18 0748)  . vancomycin Stopped (12/09/18 1338)    I/O last 3 completed shifts: In: 2323.1 [I.V.:1485.6; IV Piggyback:837.5] Out: 2755 [Urine:2335; Chest Tube:420]   CBC Latest Ref Rng & Units 12/09/2018 12/08/2018 12/07/2018  WBC 4.0 - 10.5 K/uL 12.8(H) 17.3(H) -  Hemoglobin 13.0 - 17.0 g/dL 9.2(L) 9.0(L) 9.9(L)  Hematocrit 39.0 - 52.0 % 29.4(L) 28.7(L) 29.0(L)  Platelets 150 - 400 K/uL 262 229 -    BMP Latest Ref Rng & Units 12/09/2018 12/08/2018 12/07/2018  Glucose 70 - 99 mg/dL 96 131(H) -  BUN 8 - 23 mg/dL 18 20 -  Creatinine 0.61 - 1.24 mg/dL 1.13 1.16 -  Sodium 135 - 145 mmol/L 132(L) 133(L) 133(L)  Potassium 3.5 - 5.1 mmol/L 3.1(L) 3.1(L) 3.6  Chloride 98 - 111 mmol/L 102 99 -  CO2 22 - 32 mmol/L 21(L) 24 -  Calcium 8.9 - 10.3 mg/dL 7.4(L) 7.5(L) -    ABG    Component Value Date/Time   PHART 7.433 12/07/2018 0943    PCO2ART 40.2 12/07/2018 0943   PO2ART 94.0 12/07/2018 0943   HCO3 26.9 12/07/2018 0943   TCO2 28 12/07/2018 0943   ACIDBASEDEF 2.0 12/05/2018 0420   O2SAT 61.6 12/09/2018 0415       Melodie Bouillon, MD 12/09/2018 6:06 PM

## 2018-12-09 NOTE — Plan of Care (Signed)
  Problem: Education: Goal: Understanding of CV disease, CV risk reduction, and recovery process will improve Outcome: Progressing   Problem: Activity: Goal: Ability to return to baseline activity level will improve Outcome: Progressing   Problem: Cardiovascular: Goal: Ability to achieve and maintain adequate cardiovascular perfusion will improve Outcome: Progressing   Problem: Health Behavior/Discharge Planning: Goal: Ability to safely manage health-related needs after discharge will improve Outcome: Progressing   

## 2018-12-09 NOTE — Evaluation (Signed)
Occupational Therapy Evaluation Patient Details Name: Curtis [name] MRN: [id number] DOB: 03/21/1950 Today's Date: 12/09/2018    History of Present Illness Curtis [name] is a 68 y.o. male admitted 11/23/18 with sudden onset SOB. S/p CABG & bioprosthetic MVR with Impella placement on 9/28; this was complicated by severe coagulopathy/bleeding requiring return to OR. S/p Impella removal and evacuation of left hemothorax on 10/2. ETT 9/28-9/30, and 10/2-10/3. No significant PMH.   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 Pt today is lethargic, fatigued. He did share that he has bad cataracts at baseline and does not have any visual impairment assistance/compensatory strategies around his home - he would benefit from education in this area. He was mod A for UB ADL, max A for LB ADL, he requires continued education for sternal precautions especially with regard to ADL. OT will follow acutely and recommending CIR level therapy to maximize safety and independence in ADL and functional transfers to return to PLOF.     Follow Up Recommendations  CIR    Equipment Recommendations  Other (comment)(defer to next venue of care)    Recommendations for Other Services Rehab consult     Precautions / Restrictions Precautions Precautions: Fall;Sternal Precaution Booklet Issued: No Precaution Comments: Initiated precaution education Restrictions Weight Bearing Restrictions: Yes Other Position/Activity Restrictions: sternal precautions      Mobility Bed Mobility Overal bed mobility: Needs Assistance Bed Mobility: Supine to Sit;Sit to Supine     Supine to sit: Mod assist Sit to supine: Min assist   General bed mobility comments: ModA to assist trunk elevation while maintaining sternal precautions; minA for repositioning with return to supine; Pt initiated return to supine in the middle of education for log roll technique  Transfers Overall  transfer level: Needs assistance Equipment used: 2 person hand held assist Transfers: Sit to/from Stand Sit to Stand: Mod assist         General transfer comment: Reliant on HHA and assist to power into standing, performed 2x trials; further mobility limited by fatigue    Balance Overall balance assessment: Needs assistance   Sitting balance-Leahy Scale: Fair       Standing balance-Leahy Scale: Poor                             ADL either performed or assessed with clinical judgement   ADL Overall ADL's : Needs assistance/impaired Eating/Feeding: Set up;Sitting   Grooming: Wash/dry face;Set up;Sitting   Upper Body Bathing: Moderate assistance;Sitting   Lower Body Bathing: Maximal assistance;Sitting/lateral leans   Upper Body Dressing : Moderate assistance;Sitting   Lower Body Dressing: Moderate assistance;+2 for physical assistance   Toilet Transfer: Minimal assistance;+2 for physical assistance;BSC   Toileting- Clothing Manipulation and Hygiene: Maximal assistance;Sit to/from stand       Functional mobility during ADLs: Minimal assistance;+2 for safety/equipment General ADL Comments: initiated sternal precautions with application to ADL tasks     Vision Baseline Vision/History: Cataracts Patient Visual Report: No change from baseline Additional Comments: Pt with eyes closed majority of session; does not have anything extra set up at house for visual deficits     Perception     Praxis      Pertinent Vitals/Pain Pain Assessment: Faces Faces Pain Scale: Hurts little more Pain Location: Generalized, sternum Pain Descriptors / Indicators: Discomfort;Grimacing Pain Intervention(s): Monitored during session;Limited activity within patient's tolerance     Hand Dominance  Extremity/Trunk Assessment Upper Extremity Assessment Upper Extremity Assessment: Generalized weakness   Lower Extremity Assessment Lower Extremity Assessment: Generalized  weakness       Communication Communication Communication: No difficulties   Cognition Arousal/Alertness: Awake/alert;Lethargic Behavior During Therapy: Flat affect Overall Cognitive Status: Within Functional Limits for tasks assessed                                 General Comments: WFL for simple tasks, although seems limited by fatigue/lethargy with eyes closed majority of session   General Comments  Vision limited by cataracts    Exercises     Shoulder Instructions      Home Living Family/patient expects to be discharged to:: Private residence Living Arrangements: Parent(Pt care for mother with Alzheimers) Available Help at Discharge: Family;Neighbor;Available PRN/intermittently Type of Home: House Home Access: Stairs to enter [employer] of Steps: 2-3 Entrance Stairs-Rails: Right Home Layout: Two level Alternate Level Stairs-Number of Steps: Flight Alternate Level Stairs-Rails: Right Bathroom Shower/Tub: Chief Strategy Officer: Standard     Home Equipment: None          Prior Functioning/Environment Level of Independence: Independent        Comments: Independent with mobility, retired from work; Psychologist, 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, 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        OT Problem List: Decreased range of motion;Decreased activity tolerance;Impaired balance (sitting and/or standing);Decreased safety awareness;Decreased knowledge of use of DME or AE;Decreased knowledge of precautions;Cardiopulmonary status limiting activity;Pain;Increased edema      OT Treatment/Interventions: Self-care/ADL training;Energy conservation;DME and/or AE instruction;Therapeutic activities;Patient/family education;Balance training;Visual/perceptual remediation/compensation    OT Goals(Current goals can be found in the care plan section) Acute Rehab OT Goals Patient Stated Goal: Return home OT Goal Formulation: With patient Time  For Goal Achievement: 12/23/18 Potential to Achieve Goals: Good ADL Goals Pt Will Perform Grooming: with supervision;standing Pt Will Perform Upper Body Dressing: with set-up;sitting Pt Will Perform Lower Body Dressing: with supervision;sit to/from stand Pt Will Transfer to Toilet: with supervision;ambulating Pt Will Perform Toileting - Clothing Manipulation and hygiene: with modified independence;sitting/lateral leans Additional ADL Goal #1: Pt will recall and maintain sternal precautions during ADL with one or less verbal cues  OT Frequency: Min 2X/week   Barriers to D/C:            Co-evaluation PT/OT/SLP Co-Evaluation/Treatment: Yes Reason for Co-Treatment: Complexity of the patient's impairments (multi-system involvement);Necessary to address cognition/behavior during functional activity;For patient/therapist safety(activity tolerance) PT goals addressed during session: Mobility/safety with mobility;Balance;Proper use of DME OT goals addressed during session: ADL's and self-care;Proper use of Adaptive equipment and DME      AM-PAC OT "6 Clicks" Daily Activity     Outcome Measure Help from another person eating meals?: A Little Help from another person taking care of personal grooming?: A Little Help from another person toileting, which includes using toliet, bedpan, or urinal?: A Lot Help from another person bathing (including washing, rinsing, drying)?: A Lot Help from another person to put on and taking off regular upper body clothing?: A Lot Help from another person to put on and taking off regular lower body clothing?: A Lot 6 Click Score: 14   End of Session Equipment Utilized During Treatment: Oxygen Nurse Communication: Mobility status  Activity Tolerance: Patient limited by fatigue;Patient limited by lethargy Patient left: in bed;with call bell/phone within reach;with bed alarm set(in bed due to anticipation of  transferring units)  OT Visit Diagnosis: Unsteadiness  on feet (R26.81);Other abnormalities of gait and mobility (R26.89);Muscle weakness (generalized) (M62.81)                Time: 8032-1224 OT Time Calculation (min): 16 min Charges:  OT General Charges $OT Visit: 1 Visit OT Evaluation $OT Eval Moderate Complexity: Curtis [name] Acute Rehabilitation Services Pager: [phone] Office: [place] 12/09/2018, 6:17 PM

## 2018-12-09 NOTE — Evaluation (Signed)
Physical Therapy Evaluation Patient Details Name: Curtis [name] MRN: [id number] DOB: 06-20-1950 Today's Date: 12/09/2018   History of Present Illness  Xayvier Vallez is a 68 y.o. male admitted 11/23/18 with sudden onset SOB. S/p CABG & bioprosthetic MVR with Impella placement on 9/28; this was complicated by severe coagulopathy/bleeding requiring return to OR. S/p Impella removal and evacuation of left hemothorax on 10/2. ETT 9/28-9/30, and 10/2-10/3. No significant PMH.    Clinical Impression  Pt presents with an overall decrease in functional mobility secondary to above. PTA, pt independent and lives with mother who he cares for due to her h/o dementia. Today, pt requiring up to [facility] for mobility; limited by generalized weakness, decreased activity tolerance and fatigue. Pt would benefit from intensive CIR-level therapies to maximize functional mobility and independence prior to return home. Will follow acutely to address established goals.     Follow Up Recommendations CIR;Supervision for mobility/OOB    Equipment Recommendations  (TBD)    Recommendations for Other Services       Precautions / Restrictions Precautions Precautions: Fall;Sternal Precaution Comments: Initiated precaution education Restrictions Weight Bearing Restrictions: No      Mobility  Bed Mobility Overal bed mobility: Needs Assistance Bed Mobility: Supine to Sit;Sit to Supine     Supine to sit: Mod assist Sit to supine: Min assist   General bed mobility comments: ModA to assist trunk elevation while maintaining sternal precautions; minA for repositioning with return to supin  Transfers Overall transfer level: Needs assistance Equipment used: 2 person hand held assist Transfers: Sit to/from Stand Sit to Stand: Mod assist         General transfer comment: Reliant on HHA and assist to power into standing, performed 2x trials; further mobility limited by fatigue  Ambulation/Gait Ambulation/Gait assistance:  Min assist Gait Distance (Feet): 3 Feet Assistive device: 1 person hand held assist Gait Pattern/deviations: Step-to pattern;Trunk flexed Gait velocity: Decreased   General Gait Details: Took side steps towards HOB, reliant on HHA and minA to maintain balance; further distance limited by fatigue  Stairs            Wheelchair Mobility    Modified Rankin (Stroke Patients Only)       Balance Overall balance assessment: Needs assistance   Sitting balance-Leahy Scale: Fair       Standing balance-Leahy Scale: Poor                               Pertinent Vitals/Pain Pain Assessment: Faces Faces Pain Scale: Hurts little more Pain Location: Generalized, sternum Pain Descriptors / Indicators: Discomfort;Grimacing Pain Intervention(s): Monitored during session;Limited activity within patient's tolerance    Home Living Family/patient expects to be discharged to:: Private residence Living Arrangements: Parent Available Help at Discharge: Family;Neighbor;Available PRN/intermittently Type of Home: House Home Access: Stairs to enter Entrance Stairs-Rails: Right Entrance Stairs-Number of Steps: 2-3 Home Layout: Two level Home Equipment: None      Prior Function Level of Independence: Independent         Comments: Independent with mobility, retired from work; Psychologist, 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, 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     Higher education careers adviser        Extremity/Trunk Assessment   Upper Extremity Assessment Upper Extremity Assessment: Generalized weakness    Lower Extremity Assessment Lower Extremity Assessment: Generalized weakness       Communication   Communication: No difficulties  Cognition Arousal/Alertness: Awake/alert;Lethargic Behavior During  Therapy: Flat affect Overall Cognitive Status: Within Functional Limits for tasks assessed                                 General Comments: WFL for simple tasks,  although seems limited by fatigue/lethargy with eyes closed majority of session      General Comments General comments (skin integrity, edema, etc.): Vision limited by cataracts    Exercises     Assessment/Plan    PT Assessment Patient needs continued PT services  PT Problem List Decreased strength;Decreased range of motion;Decreased activity tolerance;Decreased balance;Decreased mobility;Decreased knowledge of use of DME;Decreased knowledge of precautions;Cardiopulmonary status limiting activity       PT Treatment Interventions DME instruction;Gait training;Stair training;Functional mobility training;Therapeutic activities;Therapeutic exercise;Balance training;Patient/family education    PT Goals (Current goals can be found in the Care Plan section)  Acute Rehab PT Goals Patient Stated Goal: Return home PT Goal Formulation: With patient Time For Goal Achievement: 12/23/18 Potential to Achieve Goals: Good    Frequency Min 3X/week   Barriers to discharge        Co-evaluation PT/OT/SLP Co-Evaluation/Treatment: Yes Reason for Co-Treatment: Complexity of the patient's impairments (multi-system involvement);For patient/therapist safety;To address functional/ADL transfers(Pt limited by fatigue) PT goals addressed during session: Mobility/safety with mobility;Balance         AM-PAC PT "6 Clicks" Mobility  Outcome Measure Help needed turning from your back to your side while in a flat bed without using bedrails?: A Little Help needed moving from lying on your back to sitting on the side of a flat bed without using bedrails?: A Little Help needed moving to and from a bed to a chair (including a wheelchair)?: A Little Help needed standing up from a chair using your arms (e.g., wheelchair or bedside chair)?: A Little Help needed to walk in hospital room?: A Little Help needed climbing 3-5 steps with a railing? : A Lot 6 Click Score: 17    End of Session   Activity Tolerance:  Patient limited by fatigue Patient left: in bed;with call bell/phone within reach Nurse Communication: Mobility status PT Visit Diagnosis: Other abnormalities of gait and mobility (R26.89);Muscle weakness (generalized) (M62.81)    Time: 2330-0762 PT Time Calculation (min) (ACUTE ONLY): 18 min   Charges:   PT Evaluation $PT Eval Moderate Complexity: [place], PT, DPT Acute Rehabilitation Services  Pager [phone] Office [place] 12/09/2018, 4:34 PM

## 2018-12-09 NOTE — Progress Notes (Addendum)
[facility] DAILY ICU PROGRESS NOTE                   [address]            [place] [address]          [phone]   5 Days Post-Op Procedure(s) (LRB): REMOVAL OF IMPELLA LEFT VENTRICULAR ASSIST DEVICE.  EVACUATION OF LEFT HEMOTHORAX (N/A) TRANSESOPHAGEAL ECHOCARDIOGRAM (TEE) (N/A) Sternal Plating with Sternal Rewiring  Total Length of Stay:  LOS: 15 days   Subjective: Patient has had a difficult time sleeping so he was given Benadryl. He then became confused so he was put in soft mittens.  Objective: Vital signs in last 24 hours: Temp:  [97.7 F (36.5 C)-98.3 F (36.8 C)] 98.3 F (36.8 C) (10/07 0400) Pulse Rate:  [27-134] 87 (10/07 0800) Cardiac Rhythm: Normal sinus rhythm (10/07 0400) Resp:  [17-28] 24 (10/07 0800) BP: (86-135)/(51-76) 122/65 (10/07 0800) SpO2:  [63 %-100 %] 100 % (10/07 0800) Weight:  [91.7 kg] 91.7 kg (10/07 0413)  Filed Weights   12/07/18 0500 12/08/18 0446 12/09/18 0413  Weight: 90.5 kg 92.2 kg 91.7 kg    Weight change: -0.5 kg   Hemodynamic parameters for last 24 hours: CVP:  [4 mmHg-6 mmHg] 4 mmHg  Intake/Output from previous day: 10/06 0701 - 10/07 0700 In: 1623.5 [I.V.:886; IV Piggyback:737.5] Out: 1730 [Urine:1410; Chest Tube:320]  Intake/Output this shift: No intake/output data recorded.  Current Meds: Scheduled Meds: . amiodarone  200 mg Oral BID  . aspirin EC  325 mg Oral Daily   Or  . aspirin  324 mg Per Tube Daily  . bisacodyl  10 mg Oral Daily   Or  . bisacodyl  10 mg Rectal Daily  . chlorhexidine  15 mL Mouth Rinse BID  . Chlorhexidine Gluconate Cloth  6 each Topical Daily  . docusate sodium  200 mg Oral Daily  . enoxaparin (LOVENOX) injection  40 mg Subcutaneous Q24H  . feeding supplement (ENSURE ENLIVE)  237 mL Oral BID BM  . furosemide  40 mg Oral Daily  . guaiFENesin  600 mg Oral BID  . insulin aspart  2-6 Units Subcutaneous Q4H  . insulin detemir  5 Units Subcutaneous Q12H  . levalbuterol  0.63 mg  Nebulization BID  . losartan  12.5 mg Oral Daily  . pantoprazole  40 mg Oral Daily  . sodium chloride flush  3 mL Intravenous Q12H  . spironolactone  25 mg Oral Daily   Continuous Infusions: . sodium chloride Stopped (12/04/18 1251)  . sodium chloride 10 mL/hr at 12/09/18 0600  . sodium chloride Stopped (12/09/18 0515)  . ceFEPime (MAXIPIME) IV 2 g (12/09/18 0400)  . epinephrine Stopped (12/08/18 0955)  . lactated ringers    . lactated ringers 10 mL/hr at 12/07/18 0500  . nitroGLYCERIN Stopped (12/03/18 2300)  . norepinephrine (LEVOPHED) Adult infusion Stopped (12/06/18 0331)  . phenylephrine (NEO-SYNEPHRINE) Adult infusion Stopped (12/05/18 0748)  . potassium chloride 10 mEq (12/09/18 0745)  . vancomycin Stopped (12/08/18 1415)   PRN Meds:.sodium chloride, sodium chloride, hydrALAZINE, metoprolol tartrate, morphine injection, ondansetron (ZOFRAN) IV, oxyCODONE, traMADol  Heart: RRR Lungs: Slightly diminshed at bases Abdomen: Soft, non tender, bowel sounds present Extremities: SCDs in place Wound: Staples intact on sternal wound and it is clean and dry.   Lab Results: CBC: Recent Labs    12/08/18 0750 12/09/18 0424  WBC 17.3* 12.8*  HGB 9.0* 9.2*  HCT 28.7* 29.4*  PLT 229 262  BMET:  Recent Labs    12/08/18 0750 12/09/18 0424  NA 133* 132*  K 3.1* 3.1*  CL 99 102  CO2 24 21*  GLUCOSE 131* 96  BUN 20 18  CREATININE 1.16 1.13  CALCIUM 7.5* 7.4*    CMET: Lab Results  Component Value Date   WBC 12.8 (H) 12/09/2018   HGB 9.2 (L) 12/09/2018   HCT 29.4 (L) 12/09/2018   PLT 262 12/09/2018   GLUCOSE 96 12/09/2018   CHOL 233 (H) 11/25/2018   TRIG 155 (H) 11/25/2018   HDL 31 (L) 11/25/2018   LDLCALC 171 (H) 11/25/2018   ALT 19 12/07/2018   AST 18 12/07/2018   NA 132 (L) 12/09/2018   K 3.1 (L) 12/09/2018   CL 102 12/09/2018   CREATININE 1.13 12/09/2018   BUN 18 12/09/2018   CO2 21 (L) 12/09/2018   TSH 0.350 11/24/2018   INR 1.5 (H) 11/30/2018   HGBA1C  5.8 (H) 11/24/2018    PT/INR: No results for input(s): LABPROT, INR in the last 72 hours. Radiology: No results found.   Assessment/Plan: S/P Procedure(s) (LRB): REMOVAL OF IMPELLA LEFT VENTRICULAR ASSIST DEVICE.  EVACUATION OF LEFT HEMOTHORAX (N/A) TRANSESOPHAGEAL ECHOCARDIOGRAM (TEE) (N/A) Sternal Plating with Sternal Rewiring   1. CV-Post cardiogenic shock. Impella removed 10/02. On Milrinone 0.125 mcg daily. Co ox this am slightly increased to 61.6. Previous PAF so on Amiodarone drip. He is in SR, first degree heart block this am. Amiodarone and Milrinone drips to be stopped. Continue Losartan 12.5 mg daily and Spironolactone 25 mg daily. Amiodarone 200 mg bid to be started today as well. 2. Pulmonary-Evacuation of hemothorax 10/02 and extubated on 10/03. Chest tubes with 320 last 24 hours . Chest tubes are to suction and there is no air leak 3. Volume overload- CVP 4-5. On Lasix 40 mg orally daily 4. ABL anemia-H and H this am stable at 9.2 and 29.4 5. Supplement potassium 7. Leukocytosis-WBC decreased to 12,800 this am. UC and blood cultures show no growth. On Vancomycin and Maxipime. 8. Deconditioning-continue with PT/OT. Await CIR consult for placement 9. Will discuss with Dr. Vickey Sages, but possibly transfer to stepdown once more mobile  Donielle Margaretann Loveless PA-C 12/09/2018 8:06 AM   Pt seen and examined; chart reviewed. Appreciate Heart Failure Team involvement and recommendations. Ok to transfer to tele. Needs aggressive rehab and nutrition. Jaycelyn Orrison Z. Vickey Sages, MD [phone]

## 2018-12-09 NOTE — Progress Notes (Signed)
Inpatient Rehabilitation Admissions Coordinator  Inpatient rehab consult received. I await PT and OT evaluations to proceed with formal rehab consult.  Danne Baxter, RN, MSN Rehab Admissions Coordinator [phone] 12/09/2018 8:14 AM

## 2018-12-09 NOTE — Progress Notes (Addendum)
Patient ID: Curtis [name], male   DOB: 1950-07-22, 68 y.o.   MRN: [id number]    Advanced Heart Failure Rounding Note   Subjective:    Impella removed + evacuation of hemothorax 10/2.  Extubated and Swan removed 10/3.   Yesterday epi stopped and milrinone cut back to 0.125 mcg. Today CO-OX is 62%.   Overnight had a hard time sleeping. Given benadryl and was confused. Mittens placed.   Denies SOB.   Objective:   Weight Range:  Vital Signs:   Temp:  [97.7 F (36.5 C)-98.3 F (36.8 C)] 98.3 F (36.8 C) (10/07 0400) Pulse Rate:  [27-134] 87 (10/07 0700) Resp:  [17-28] 23 (10/07 0700) BP: (86-135)/(51-80) 135/62 (10/07 0700) SpO2:  [63 %-100 %] 100 % (10/07 0730) Weight:  [91.7 kg] 91.7 kg (10/07 0413) Last BM Date: 12/08/18  Weight change: Filed Weights   12/07/18 0500 12/08/18 0446 12/09/18 0413  Weight: 90.5 kg 92.2 kg 91.7 kg    Intake/Output:   Intake/Output Summary (Last 24 hours) at 12/09/2018 0745 Last data filed at 12/09/2018 0600 Gross per 24 hour  Intake 1623.49 ml  Output 1730 ml  Net -106.51 ml    CVP  4.  Physical Exam: General:  Drowsy . No resp difficulty HEENT: normal anicteric Neck: supple. no JVD. Carotids 2+ bilat; no bruits. No lymphadenopathy or thryomegaly appreciated. Cor: Sternal dressing ok PMI nondisplaced. Regular rate & rhythm. No rubs, gallops or murmurs. Sternal incision approximated.  Lungs: clear on room air. Decreased at bases  Abdomen: soft, nontender, nondistended. No hepatosplenomegaly. No bruits or masses. Good bowel sounds. Extremities: no cyanosis, clubbing, rash, edema. R/L hand mittens in place Neuro: alert & orientedx3, cranial nerves grossly intact. moves all 4 extremities w/o difficulty. Affect flat    Telemetry: NSR 80s Personally reviewed  Labs: Basic Metabolic Panel: Recent Labs  Lab 12/03/18 0416  12/05/18 0401  12/06/18 0428  12/07/18 0428 12/07/18 0544 12/07/18 0943 12/08/18 0343 12/08/18 0750 12/09/18 0424   NA 134*   < > 132*   < > 133*   < > 134* 134* 133*  --  133* 132*  K 3.7   < > 3.8   < > 3.2*   < > 3.4* 3.5 3.6  --  3.1* 3.1*  CL 103   < > 104  --  97*  --  95*  --   --   --  99 102  CO2 24   < > 19*  --  26  --  27  --   --   --  24 21*  GLUCOSE 153*   < > 131*  --  140*  --  162*  --   --   --  131* 96  BUN 19   < > 17  --  17  --  18  --   --   --  20 18  CREATININE 1.24   < > 1.23  --  1.16  --  1.20  --   --   --  1.16 1.13  CALCIUM 7.5*   < > 7.2*  --  7.6*  --  7.6*  --   --   --  7.5* 7.4*  MG 2.0  --   --   --  1.9  --  1.9  --   --  2.2  --  2.1  PHOS 3.6  --   --   --   --   --   --   --   --   --   --   --    < > =  values in this interval not displayed.    Liver Function Tests: Recent Labs  Lab 12/06/18 0428 12/07/18 0428  AST 19 18  ALT 21 19  ALKPHOS 58 63  BILITOT 3.4* 2.6*  PROT 5.0* 5.0*  ALBUMIN 2.4* 2.3*   No results for input(s): LIPASE, AMYLASE in the last 168 hours. No results for input(s): AMMONIA in the last 168 hours.  CBC: Recent Labs  Lab 12/03/18 0416  12/04/18 1642  12/05/18 0401  12/06/18 0428  12/07/18 0428 12/07/18 0544 12/07/18 0943 12/08/18 0750 12/09/18 0424  WBC 15.7*   < > 24.5*  --  24.7*  --  18.4*  --  26.9*  --   --  17.3* 12.8*  NEUTROABS 12.8*  --  18.9*  --   --   --   --   --   --   --   --   --  9.9*  HGB 8.7*   < > 9.3*   < > 9.7*   < > 9.7*   < > 9.8* 10.2* 9.9* 9.0* 9.2*  HCT 24.5*   < > 26.6*   < > 28.2*   < > 28.8*   < > 29.5* 30.0* 29.0* 28.7* 29.4*  MCV 90.4   < > 89.3  --  90.1  --  91.4  --  93.4  --   --  97.0 96.4  PLT 62*   < > 121*  --  128*  --  127*  --  182  --   --  229 262   < > = values in this interval not displayed.    Cardiac Enzymes: No results for input(s): CKTOTAL, CKMB, CKMBINDEX, TROPONINI in the last 168 hours.  BNP: BNP (last 3 results) Recent Labs    11/23/18 2328  BNP 507.9*    ProBNP (last 3 results) No results for input(s): PROBNP in the last 8760 hours.    Other  results:  Imaging: Ct Head Wo Contrast  Result Date: 12/07/2018 CLINICAL DATA:  Altered level of consciousness. EXAM: CT HEAD WITHOUT CONTRAST TECHNIQUE: Contiguous axial images were obtained from the base of the skull through the vertex without intravenous contrast. COMPARISON:  None. FINDINGS: Brain: No evidence of acute infarction, hemorrhage, extra-axial collection, ventriculomegaly, or mass effect. Small left basal ganglia lacunar infarct. Generalized cerebral atrophy. Periventricular white matter low attenuation likely secondary to microangiopathy. Vascular: Cerebrovascular atherosclerotic calcifications are noted. Skull: Negative for fracture or focal lesion. Sinuses/Orbits: Visualized portions of the orbits are unremarkable. Mastoid sinuses are clear. Mucous retention cyst in bilateral maxillary sinuses. Other: None. IMPRESSION: 1. No acute intracranial pathology. 2. Chronic microvascular disease and cerebral atrophy. Electronically Signed   By: Elige Ko   On: 12/07/2018 09:06   Ct Angio Chest Pe W Or Wo Contrast  Result Date: 12/07/2018 CLINICAL DATA:  Hypoxia, abdominal distention. EXAM: CT ANGIOGRAPHY CHEST CT ABDOMEN AND PELVIS WITH CONTRAST TECHNIQUE: Multidetector CT imaging of the chest was performed using the standard protocol during bolus administration of intravenous contrast. Multiplanar CT image reconstructions and MIPs were obtained to evaluate the vascular anatomy. Multidetector CT imaging of the abdomen and pelvis was performed using the standard protocol during bolus administration of intravenous contrast. CONTRAST:  OMNIPAQUE IOHEXOL 350 MG/ML SOLN COMPARISON:  CT scan of November 27, 2018. FINDINGS: CTA CHEST FINDINGS Cardiovascular: Satisfactory opacification of the pulmonary arteries to the segmental level. No evidence of pulmonary embolism. Mild cardiomegaly is noted. Atherosclerosis of thoracic aorta is noted without  aneurysm formation. Status post mitral valve  repair. Coronary artery calcifications are noted. No pericardial effusion. Mediastinum/Nodes: The esophagus is unremarkable. No significant adenopathy is noted. Thyroid gland is unremarkable. Mediastinal drain is noted anteriorly. Lungs/Pleura: Bilateral chest tubes are noted without definite pneumothorax. Mild bilateral lower lobe subsegmental atelectasis is noted. Musculoskeletal: Sternotomy wires are noted. No other significant osseous abnormality is noted. Review of the MIP images confirms the above findings. CT ABDOMEN and PELVIS FINDINGS Hepatobiliary: No focal liver abnormality is seen. No gallstones, gallbladder wall thickening, or biliary dilatation. Pancreas: Unremarkable. No pancreatic ductal dilatation or surrounding inflammatory changes. Spleen: Normal in size without focal abnormality. Adrenals/Urinary Tract: Adrenal glands appear normal. 13 mm calculus is noted in upper pole collecting system of left kidney. 8 mm calculus is also noted in upper pole of left kidney. Smaller nonobstructive calculus is seen in lower pole collecting system of right kidney. 1.9 cm rounded hyperdense abnormality is seen arising from lower pole right kidney. Large left parapelvic cysts are noted. No definite hydronephrosis or renal obstruction is noted. Urinary bladder catheter is noted. Stomach/Bowel: Stomach is within normal limits. Appendix appears normal. No evidence of bowel wall thickening, distention, or inflammatory changes. Vascular/Lymphatic: Aortic atherosclerosis. No enlarged abdominal or pelvic lymph nodes. Reproductive: Mild prostatic enlargement is noted. Other: No abdominal wall hernia or abnormality. No abdominopelvic ascites. Musculoskeletal: No acute or significant osseous findings. Review of the MIP images confirms the above findings. IMPRESSION: No definite evidence of pulmonary embolus. Bilateral chest tubes are noted without pneumothorax. Mild bilateral posterior basilar subsegmental atelectasis is  noted. 1.9 cm rounded hyperdense abnormality is seen arising from lower pole of right kidney which may represent hyperdense cyst, but neoplasm cannot be excluded. Ultrasound is recommended for further evaluation. Large nonobstructive left renal calculi are noted. Large left renal pelvic cysts are noted. Mild prostatic enlargement. Aortic Atherosclerosis (ICD10-I70.0). Electronically Signed   By: Marijo Conception M.D.   On: 12/07/2018 09:15   Ct Abdomen Pelvis W Contrast  Result Date: 12/07/2018 CLINICAL DATA:  Hypoxia, abdominal distention. EXAM: CT ANGIOGRAPHY CHEST CT ABDOMEN AND PELVIS WITH CONTRAST TECHNIQUE: Multidetector CT imaging of the chest was performed using the standard protocol during bolus administration of intravenous contrast. Multiplanar CT image reconstructions and MIPs were obtained to evaluate the vascular anatomy. Multidetector CT imaging of the abdomen and pelvis was performed using the standard protocol during bolus administration of intravenous contrast. CONTRAST:  155mL OMNIPAQUE IOHEXOL 350 MG/ML SOLN COMPARISON:  CT scan of November 27, 2018. FINDINGS: CTA CHEST FINDINGS Cardiovascular: Satisfactory opacification of the pulmonary arteries to the segmental level. No evidence of pulmonary embolism. Mild cardiomegaly is noted. Atherosclerosis of thoracic aorta is noted without aneurysm formation. Status post mitral valve repair. Coronary artery calcifications are noted. No pericardial effusion. Mediastinum/Nodes: The esophagus is unremarkable. No significant adenopathy is noted. Thyroid gland is unremarkable. Mediastinal drain is noted anteriorly. Lungs/Pleura: Bilateral chest tubes are noted without definite pneumothorax. Mild bilateral lower lobe subsegmental atelectasis is noted. Musculoskeletal: Sternotomy wires are noted. No other significant osseous abnormality is noted. Review of the MIP images confirms the above findings. CT ABDOMEN and PELVIS FINDINGS Hepatobiliary: No focal  liver abnormality is seen. No gallstones, gallbladder wall thickening, or biliary dilatation. Pancreas: Unremarkable. No pancreatic ductal dilatation or surrounding inflammatory changes. Spleen: Normal in size without focal abnormality. Adrenals/Urinary Tract: Adrenal glands appear normal. 13 mm calculus is noted in upper pole collecting system of left kidney. 8 mm calculus is also noted in upper pole of  left kidney. Smaller nonobstructive calculus is seen in lower pole collecting system of right kidney. 1.9 cm rounded hyperdense abnormality is seen arising from lower pole right kidney. Large left parapelvic cysts are noted. No definite hydronephrosis or renal obstruction is noted. Urinary bladder catheter is noted. Stomach/Bowel: Stomach is within normal limits. Appendix appears normal. No evidence of bowel wall thickening, distention, or inflammatory changes. Vascular/Lymphatic: Aortic atherosclerosis. No enlarged abdominal or pelvic lymph nodes. Reproductive: Mild prostatic enlargement is noted. Other: No abdominal wall hernia or abnormality. No abdominopelvic ascites. Musculoskeletal: No acute or significant osseous findings. Review of the MIP images confirms the above findings. IMPRESSION: No definite evidence of pulmonary embolus. Bilateral chest tubes are noted without pneumothorax. Mild bilateral posterior basilar subsegmental atelectasis is noted. 1.9 cm rounded hyperdense abnormality is seen arising from lower pole of right kidney which may represent hyperdense cyst, but neoplasm cannot be excluded. Ultrasound is recommended for further evaluation. Large nonobstructive left renal calculi are noted. Large left renal pelvic cysts are noted. Mild prostatic enlargement. Aortic Atherosclerosis (ICD10-I70.0). Electronically Signed   By: Lupita Raider M.D.   On: 12/07/2018 09:15     Medications:     Scheduled Medications:  aspirin EC  325 mg Oral Daily   Or   aspirin  324 mg Per Tube Daily    bisacodyl  10 mg Oral Daily   Or   bisacodyl  10 mg Rectal Daily   chlorhexidine  15 mL Mouth Rinse BID   Chlorhexidine Gluconate Cloth  6 each Topical Daily   docusate sodium  200 mg Oral Daily   enoxaparin (LOVENOX) injection  40 mg Subcutaneous Q24H   feeding supplement (ENSURE ENLIVE)  237 mL Oral BID BM   furosemide  40 mg Oral Daily   guaiFENesin  600 mg Oral BID   insulin aspart  2-6 Units Subcutaneous Q4H   insulin detemir  5 Units Subcutaneous Q12H   levalbuterol  0.63 mg Nebulization BID   pantoprazole  40 mg Oral Daily   sodium chloride flush  3 mL Intravenous Q12H   spironolactone  25 mg Oral Daily    Infusions:  sodium chloride Stopped (12/04/18 1251)   sodium chloride 10 mL/hr at 12/09/18 0600   sodium chloride Stopped (12/09/18 0515)   amiodarone 30 mg/hr (12/09/18 0600)   ceFEPime (MAXIPIME) IV 2 g (12/09/18 0400)   epinephrine Stopped (12/08/18 0955)   lactated ringers     lactated ringers 10 mL/hr at 12/07/18 0500   milrinone 0.125 mcg/kg/min (12/09/18 0600)   nitroGLYCERIN Stopped (12/03/18 2300)   norepinephrine (LEVOPHED) Adult infusion Stopped (12/06/18 0331)   phenylephrine (NEO-SYNEPHRINE) Adult infusion Stopped (12/05/18 0748)   potassium chloride 10 mEq (12/09/18 0745)   vancomycin Stopped (12/08/18 1415)    PRN Medications: sodium chloride, sodium chloride, hydrALAZINE, metoprolol tartrate, morphine injection, ondansetron (ZOFRAN) IV, oxyCODONE, traMADol   Assessment/Plan:   1. Post-cardiotomy cardiogenic shock in setting if severe iCM and post-operative bleeding/hemorrhagic shock - pre-op echo EF 25-30% - s/p CABG & bioprosthetic MVR with Impella placement on 9/28 c/b severe coagulopathy/bleeding requiring OR takeback  - On 10/2 Impella removal and evacuation of left hemothorax -Co-OX 62%. Stop milrinone.   - CVP 4. On 40 mg lasix daily.   - Continue spiro 25 mg daily - Add 12.5 mg losartan daily. May be able to  start entresto.  2. Acute hypoxemic respiratory failure - Extubated 9/30 - Evacuation hemothorax in OR 10/2 and re-intubated.  - Extubated 10/3.   -  CT negative for PE or significant effusion/infiltrate - Sats stable on room   3. Severe MR - s/p bioprosthetic MVR. Stable   4. Acute blood loss anemia - transfuse to keep hgb >= 8.0 - Hgb  9.2   5. CAD with NSTEMI - s/p CABG 9/28 - on ASA/statin - no s/s ischemia - b-blocker and Plavix when ready  6. Thrombocytopenia - resolved.  7. PAF - Stop milrinone. Stop IV amiodarone.  Maintaining NSR.   8. DVT prophylaxis - Lovenox.   9. Leukocytosis - cultures drawn - PCT 0.45  - empiric vanc /cefipime for now  Mobilize.   Length of Stay: 15   Amy Clegg NP-C  12/09/2018, 7:45 AM  Advanced Heart Failure Team Pager [phone] (M-F; 7a - 4p)  Please contact [facility] for night-coverage after hours (4p -7a ) and weekends on [email or web address]  Patient seen and examined with Tonye Becket, NP. We discussed all aspects of the encounter. I agree with the assessment and plan as stated above.   He is improved today. Off epi. Milrinone down to 0.125. Co-ox 62% CVP down but weight still up. CXR with low lung volumes and atx.   Will stop milrinone. Continue diuresis with po diuretics. Use torsemide 40 daily. Supp K. Encoure IS and mobilization. Will need at least 5 days abx. Switch amio to po.   Arvilla Meres, MD  9:32 AM

## 2018-12-10 ENCOUNTER — Inpatient Hospital Stay: Payer: Self-pay

## 2018-12-10 LAB — COOXEMETRY PANEL
Carboxyhemoglobin: 2 % — ABNORMAL HIGH (ref 0.5–1.5)
Methemoglobin: 0.6 % (ref 0.0–1.5)
O2 Saturation: 58.8 %
Total hemoglobin: 10.3 g/dL — ABNORMAL LOW (ref 12.0–16.0)

## 2018-12-10 LAB — BASIC METABOLIC PANEL
Anion gap: 10 (ref 5–15)
BUN: 18 mg/dL (ref 8–23)
CO2: 22 mmol/L (ref 22–32)
Calcium: 7.4 mg/dL — ABNORMAL LOW (ref 8.9–10.3)
Chloride: 104 mmol/L (ref 98–111)
Creatinine, Ser: 1.01 mg/dL (ref 0.61–1.24)
GFR calc Af Amer: >60 mL/min (ref >60)
GFR calc non Af Amer: >60 mL/min (ref >60)
Glucose, Bld: 94 mg/dL (ref 70–99)
Potassium: 3.3 mmol/L — ABNORMAL LOW (ref 3.5–5.1)
Sodium: 136 mmol/L (ref 135–145)

## 2018-12-10 LAB — CBC
HCT: 32.3 % — ABNORMAL LOW (ref 39.0–52.0)
Hemoglobin: 10.1 g/dL — ABNORMAL LOW (ref 13.0–17.0)
MCH: 30.1 pg (ref 26.0–34.0)
MCHC: 31.3 g/dL (ref 30.0–36.0)
MCV: 96.4 fL (ref 80.0–100.0)
Platelets: 329 10*3/uL (ref 150–400)
RBC: 3.35 MIL/uL — ABNORMAL LOW (ref 4.22–5.81)
RDW: 19.1 % — ABNORMAL HIGH (ref 11.5–15.5)
WBC: 12.3 10*3/uL — ABNORMAL HIGH (ref 4.0–10.5)
nRBC: 0 % (ref 0.0–0.2)

## 2018-12-10 LAB — GLUCOSE, CAPILLARY
Glucose-Capillary: 100 mg/dL — ABNORMAL HIGH (ref 70–99)
Glucose-Capillary: 103 mg/dL — ABNORMAL HIGH (ref 70–99)
Glucose-Capillary: 104 mg/dL — ABNORMAL HIGH (ref 70–99)
Glucose-Capillary: 163 mg/dL — ABNORMAL HIGH (ref 70–99)
Glucose-Capillary: 81 mg/dL (ref 70–99)
Glucose-Capillary: 92 mg/dL (ref 70–99)
Glucose-Capillary: 99 mg/dL (ref 70–99)

## 2018-12-10 LAB — MAGNESIUM: Magnesium: 2.2 mg/dL (ref 1.7–2.4)

## 2018-12-10 MED ORDER — ADULT MULTIVITAMIN W/MINERALS CH
1.0000 | ORAL_TABLET | Freq: Every day | ORAL | Status: DC
  Administered 2018-12-10 – 2018-12-11 (×2): 1 via ORAL
  Filled 2018-12-10 (×2): qty 1

## 2018-12-10 MED ORDER — PRO-STAT SUGAR FREE PO LIQD
30.0000 mL | Freq: Every day | ORAL | Status: DC
  Administered 2018-12-11: 09:00:00 30 mL via ORAL
  Filled 2018-12-10: qty 30

## 2018-12-10 MED ORDER — SODIUM CHLORIDE 0.9% FLUSH: 10.0000 mL | INTRAVENOUS | Status: DC | PRN

## 2018-12-10 MED ORDER — SACUBITRIL-VALSARTAN 24-26 MG PO TABS
1.0000 | ORAL_TABLET | Freq: Two times a day (BID) | ORAL | Status: DC
  Administered 2018-12-10 – 2018-12-11 (×2): 1 via ORAL
  Filled 2018-12-10 (×3): qty 1

## 2018-12-10 MED ORDER — POTASSIUM CHLORIDE CRYS ER 20 MEQ PO TBCR
40.0000 meq | EXTENDED_RELEASE_TABLET | ORAL | Status: AC
  Administered 2018-12-10 (×2): 40 meq via ORAL
  Filled 2018-12-10 (×2): qty 2

## 2018-12-10 MED ORDER — SODIUM CHLORIDE 0.9% FLUSH
10.0000 mL | Freq: Two times a day (BID) | INTRAVENOUS | Status: DC
  Administered 2018-12-10 – 2018-12-11 (×2): 10 mL

## 2018-12-10 NOTE — Progress Notes (Signed)
Physical Therapy Treatment Patient Details Name: Curtis [name] MRN: [id number] DOB: Nov 28, 1950 Today's Date: 12/10/2018    History of Present Illness Curtis [name] is a 68 y.o. male admitted 11/23/18 with sudden onset SOB. S/p CABG & bioprosthetic MVR with Impella placement on 9/28; this was complicated by severe coagulopathy/bleeding requiring return to OR. S/p Impella removal and evacuation of left hemothorax on 10/2. ETT 9/28-9/30, and 10/2-10/3. No significant PMH.   PT Comments    Pt progressing with mobility. Today's session focused on gait training, pt requiring UE support and frequent modA to maintain balance; unable to accept challenge. Pt demonstrates poor insight into deficits, difficulty multi-tasking and decreased problem solving. Continue to recommend intensive CIR-level therapies to maximize functional mobility and independence.   Follow Up Recommendations  CIR;Supervision for mobility/OOB     Equipment Recommendations  (TBD)    Recommendations for Other Services       Precautions / Restrictions Precautions Precautions: Fall;Sternal Restrictions Weight Bearing Restrictions: No    Mobility  Bed Mobility Overal bed mobility: Needs Assistance Bed Mobility: Sit to Supine;Sidelying to Sit;Rolling Rolling: Modified independent (Device/Increase time) Sidelying to sit: Mod assist   Sit to supine: Supervision   General bed mobility comments: ModA to assist trunk elevation while maintaining sternal precautions, pt attempting to power into long sitting, cued for log roll  Transfers Overall transfer level: Needs assistance Equipment used: 1 person hand held assist Transfers: Sit to/from Stand Sit to Stand: Mod assist         General transfer comment: Cued for hand placement to maintain sternal precautions while standing, pt still reaching for UE support, reliant on HHA and modA to stand  Ambulation/Gait Ambulation/Gait assistance: Mod assist Gait Distance (Feet): 32  Feet Assistive device: 1 person hand held assist Gait Pattern/deviations: Step-to pattern;Staggering left;Staggering right;Wide base of support Gait velocity: Decreased Gait velocity interpretation: <1.31 ft/sec, indicative of household ambulator General Gait Details: Slow, very unsteady (almost ataxic-like) gait with RUE HHA and frequent modA to maintain balance; pt reaching for LUE support on furniture/walls; 1x standing rest break at sink and 1x seated rest break; pt with difficulty multitasking while walking; poor insight into balance deficits   Stairs             Wheelchair Mobility    Modified Rankin (Stroke Patients Only)       Balance Overall balance assessment: Needs assistance   Sitting balance-Leahy Scale: Fair       Standing balance-Leahy Scale: Poor Standing balance comment: Reliant on UE support and external assist; unable to tolerate challenge                            Cognition Arousal/Alertness: Awake/alert Behavior During Therapy: Flat affect Overall Cognitive Status: Impaired/Different from baseline Area of Impairment: Attention;Following commands;Safety/judgement;Problem solving                   Current Attention Level: Selective   Following Commands: Follows multi-step commands inconsistently Safety/Judgement: Decreased awareness of deficits;Decreased awareness of safety   Problem Solving: Requires verbal cues General Comments: More alert this session      Exercises      General Comments General comments (skin integrity, edema, etc.): Further discussion regarding CIR and need for 24/7 support upon return home - pt planning to speak with neighbor Minerva Areola) again later today      Pertinent Vitals/Pain Pain Assessment: Faces Faces Pain Scale: Hurts a little bit Pain Location: Generalized,  sternum Pain Descriptors / Indicators: Discomfort;Grimacing Pain Intervention(s): Monitored during session    Home Living                       Prior Function            PT Goals (current goals can now be found in the care plan section) Acute Rehab PT Goals Patient Stated Goal: Return home PT Goal Formulation: With patient Time For Goal Achievement: 12/23/18 Potential to Achieve Goals: Good Progress towards PT goals: Progressing toward goals    Frequency    Min 3X/week      PT Plan Current plan remains appropriate    Co-evaluation              AM-PAC PT "6 Clicks" Mobility   Outcome Measure  Help needed turning from your back to your side while in a flat bed without using bedrails?: A Little Help needed moving from lying on your back to sitting on the side of a flat bed without using bedrails?: A Little Help needed moving to and from a bed to a chair (including a wheelchair)?: A Lot Help needed standing up from a chair using your arms (e.g., wheelchair or bedside chair)?: A Lot Help needed to walk in hospital room?: A Lot Help needed climbing 3-5 steps with a railing? : A Lot 6 Click Score: 14    End of Session Equipment Utilized During Treatment: Gait belt Activity Tolerance: Patient tolerated treatment well Patient left: in bed;with call bell/phone within reach(return to bed awaiting PICC line placement) Nurse Communication: Mobility status PT Visit Diagnosis: Other abnormalities of gait and mobility (R26.89);Muscle weakness (generalized) (M62.81)     Time: 1610-9604 PT Time Calculation (min) (ACUTE ONLY): 21 min  Charges:  $Gait Training: 8-22 mins                    Mabeline Caras, PT, DPT Acute Rehabilitation Services  Pager [phone] Office [place] 12/10/2018, 4:24 PM

## 2018-12-10 NOTE — Progress Notes (Signed)
Peripherally Inserted Central Catheter/Midline Placement  The IV Nurse has discussed with the patient and/or persons authorized to consent for the patient, the purpose of this procedure and the potential benefits and risks involved with this procedure.  The benefits include less needle sticks, lab draws from the catheter, and the patient may be discharged home with the catheter. Risks include, but not limited to, infection, bleeding, blood clot (thrombus formation), and puncture of an artery; nerve damage and irregular heartbeat and possibility to perform a PICC exchange if needed/ordered by physician.  Alternatives to this procedure were also discussed.  Bard Power PICC patient education guide, fact sheet on infection prevention and patient information card has been provided to patient /or left at bedside.    PICC/Midline Placement Documentation  PICC Double Lumen 81/01/75 PICC Right Basilic 37 cm 0 cm (Active)  Indication for Insertion or Continuance of Line Poor Vasculature-patient has had multiple peripheral attempts or PIVs lasting less than 24 hours 12/10/18 1901  Exposed Catheter (cm) 0 cm 12/10/18 1901  Site Assessment Clean;Dry;Intact 12/10/18 1901  Lumen #1 Status Flushed;Saline locked;Blood return noted 12/10/18 1901  Lumen #2 Status Flushed;Saline locked;Blood return noted 12/10/18 1901  Dressing Type Transparent 12/10/18 1901  Dressing Status Clean;Dry;Intact;Antimicrobial disc in place 12/10/18 1901  Dressing Change Due 12/17/18 12/10/18 1901       Gordan Payment 12/10/2018, 7:03 PM

## 2018-12-10 NOTE — Progress Notes (Signed)
[facility] DAILY ICU PROGRESS NOTE                   [address]            [place] [address]          [phone]   6 Days Post-Op Procedure(s) (LRB): REMOVAL OF IMPELLA LEFT VENTRICULAR ASSIST DEVICE.  EVACUATION OF LEFT HEMOTHORAX (N/A) TRANSESOPHAGEAL ECHOCARDIOGRAM (TEE) (N/A) Sternal Plating with Sternal Rewiring  Total Length of Stay:  LOS: 16 days   Subjective: Patient talking on the phone this am.  Objective: Vital signs in last 24 hours: Temp:  [96.9 F (36.1 C)-98.4 F (36.9 C)] 98.4 F (36.9 C) (10/08 0801) Pulse Rate:  [81-90] 86 (10/08 0800) Cardiac Rhythm: Normal sinus rhythm (10/08 0800) Resp:  [16-27] 18 (10/08 0800) BP: (100-133)/(56-104) 127/71 (10/08 0800) SpO2:  [99 %-100 %] 100 % (10/08 0804) Weight:  [87.4 kg] 87.4 kg (10/08 0500)  Filed Weights   12/08/18 0446 12/09/18 0413 12/10/18 0500  Weight: 92.2 kg 91.7 kg 87.4 kg    Weight change: -4.3 kg   Hemodynamic parameters for last 24 hours: CVP:  [3 mmHg-7 mmHg] 3 mmHg  Intake/Output from previous day: 10/07 0701 - 10/08 0700 In: 1458.1 [P.O.:240; I.V.:280.7; IV Piggyback:937.4] Out: 2765 [Urine:2560; Chest Tube:205]  Intake/Output this shift: Total I/O In: 30 [I.V.:30] Out: 1 [Stool:1]  Current Meds: Scheduled Meds: . amiodarone  200 mg Oral BID  . aspirin EC  325 mg Oral Daily   Or  . aspirin  324 mg Per Tube Daily  . bisacodyl  10 mg Oral Daily   Or  . bisacodyl  10 mg Rectal Daily  . chlorhexidine  15 mL Mouth Rinse BID  . Chlorhexidine Gluconate Cloth  6 each Topical Daily  . docusate sodium  200 mg Oral Daily  . enoxaparin (LOVENOX) injection  40 mg Subcutaneous Q24H  . feeding supplement (ENSURE ENLIVE)  237 mL Oral BID BM  . guaiFENesin  600 mg Oral BID  . insulin aspart  2-6 Units Subcutaneous Q4H  . insulin detemir  5 Units Subcutaneous Q12H  . levalbuterol  0.63 mg Nebulization BID  . losartan  12.5 mg Oral Daily  . pantoprazole  40 mg Oral Daily  .  sodium chloride flush  3 mL Intravenous Q12H  . spironolactone  25 mg Oral Daily  . torsemide  40 mg Oral Daily   Continuous Infusions: . sodium chloride Stopped (12/04/18 1251)  . sodium chloride 10 mL/hr at 12/10/18 0800  . sodium chloride Stopped (12/09/18 0515)  . ceFEPime (MAXIPIME) IV Stopped (12/10/18 0340)  . epinephrine Stopped (12/08/18 0955)  . lactated ringers    . lactated ringers 10 mL/hr at 12/07/18 0500  . nitroGLYCERIN Stopped (12/03/18 2300)  . norepinephrine (LEVOPHED) Adult infusion Stopped (12/06/18 0331)  . phenylephrine (NEO-SYNEPHRINE) Adult infusion Stopped (12/05/18 0748)  . vancomycin Stopped (12/09/18 1338)   PRN Meds:.sodium chloride, sodium chloride, hydrALAZINE, metoprolol tartrate, morphine injection, ondansetron (ZOFRAN) IV, oxyCODONE, traMADol  Heart: RRR Lungs: Slightly diminshed at bases Abdomen: Soft, non tender, bowel sounds present Extremities: SCDs in place Wound: Staples intact on sternal wound and it is clean and dry.   Lab Results: CBC: Recent Labs    12/09/18 0424 12/10/18 0750  WBC 12.8* 12.3*  HGB 9.2* 10.1*  HCT 29.4* 32.3*  PLT 262 329   BMET:  Recent Labs    12/09/18 0424 12/10/18 0750  NA 132* 136  K 3.1*  3.3*  CL 102 104  CO2 21* 22  GLUCOSE 96 94  BUN 18 18  CREATININE 1.13 1.01  CALCIUM 7.4* 7.4*    CMET: Lab Results  Component Value Date   WBC 12.3 (H) 12/10/2018   HGB 10.1 (L) 12/10/2018   HCT 32.3 (L) 12/10/2018   PLT 329 12/10/2018   GLUCOSE 94 12/10/2018   CHOL 233 (H) 11/25/2018   TRIG 155 (H) 11/25/2018   HDL 31 (L) 11/25/2018   LDLCALC 171 (H) 11/25/2018   ALT 19 12/07/2018   AST 18 12/07/2018   NA 136 12/10/2018   K 3.3 (L) 12/10/2018   CL 104 12/10/2018   CREATININE 1.01 12/10/2018   BUN 18 12/10/2018   CO2 22 12/10/2018   TSH 0.350 11/24/2018   INR 1.5 (H) 11/30/2018   HGBA1C 5.8 (H) 11/24/2018    PT/INR: No results for input(s): LABPROT, INR in the last 72 hours. Radiology: No  results found.   Assessment/Plan: S/P Procedure(s) (LRB): REMOVAL OF IMPELLA LEFT VENTRICULAR ASSIST DEVICE.  EVACUATION OF LEFT HEMOTHORAX (N/A) TRANSESOPHAGEAL ECHOCARDIOGRAM (TEE) (N/A) Sternal Plating with Sternal Rewiring   1. CV-Post cardiogenic shock. Impella removed 10/02.  Co ox this am  58.8. Previous PAF so on Amiodarone drip. He is in SR, first degree heart block this am. Amiodarone and Milrinone drips to be stopped. On Losartan 12.5 mg daily and Spironolactone 25 mg daily. Amiodarone 200 mg bid to be started today as well. 2. Pulmonary-Evacuation of hemothorax 10/02 and extubated on 10/03. Check PA/LAT CXR in am. 3. Volume overload- CVP 3. On Demadex 40 mg orally daily 4. ABL anemia-H and H this am stable at 10.1 and 32.3 5. Supplement potassium 7. Leukocytosis-WBC remains 12,300 this am. UC and blood cultures show no growth. On Vancomycin and Maxipime. 8. Patient still with EPW. Will discuss with Dr. Orvan Seen when ok to remove 9. Will ask nutrition to see-encourage po 10. Deconditioning-continue with PT/OT. Await to see if CIR candidate vs SNF placement 11. Transfer when bed available  Nani Skillern PA-C 12/10/2018 9:57 AM

## 2018-12-10 NOTE — Progress Notes (Signed)
Nutrition Follow-up  DOCUMENTATION CODES:   Not applicable  INTERVENTION:   - Recommend liberalizing diet to allow for more food options and to promote PO intake  - Vanilla Magic Cup BID with meals, each supplement provides 290 kcal and 9 grams of protein  - Took pt's food preferences and added to [employer] diet software  - Pro-stat 30 ml daily, each supplement provides 100 kcal and 15 grams of protein  - MVI with minerals daily  - d/c Ensure Enlive due to pt refusal  NUTRITION DIAGNOSIS:   Inadequate oral intake related to inability to eat as evidenced by NPO status.  Progressing, pt now on Heart Healthy/Carb Modified diet  GOAL:   Patient will meet greater than or equal to 90% of their needs  Progressing  MONITOR:   PO intake, Supplement acceptance, Labs, Weight trends, I & O's, Skin  REASON FOR ASSESSMENT:   Consult Assessment of nutrition requirement/status, Diet education  ASSESSMENT:   68 year old male who presented to the ED on 9/21 with SOB. PMH of tobacco use. Pt admitted with acute hypoxemic hypercapnic respiratory failure and elevated troponin/NSTEMI. Pt also found to have CHF.  9/22 - cath showing marked LV dysfunction and multivessel CAD 9/24 - TEE 9/28 - s/p CABG x 3, mitral valve replacement, Impella placement; s/p urgent mediastinal exploration for control of bleeding 9/30 - extubated 10/2 - Impella removed, evacuation of hemothorax 10/3 - extubated  RD consulted for assessment and to encourage PO intake.  Discussed pt with RN and during ICU rounds. Noted possible d/c to CIR.  No meal completions recorded since 11/29/18.  Spoke with pt who reports appetite is slowly improving. Pt with multiple complaints about the food, timing of meals, etc. RD obtained pt's food preferences and dislikes and recorded these in [employer]Touch diet software.  Pt is unwilling to drink Ensure Enlive supplements. Pt is willing to try vanilla Magic Cups with meals.  Will also order Pro-stat to come with morning meds. Pt declines snacks between meals.  Discussed the importance of adequate nutrition in promoting healing. Discussed need for pt to consume adequate kcal and protein to heal form surgery, maintain lean muscle mass, and prevent malnutrition. Pt expresses understanding, stating "yeah, I know."  Weight down a total of 15 lbs since first measured weight on admit. However, pt's weight has fluctuated between 183-220 lbs since admit. Suspect weight fluctuations related to fluid balance. Per RN edema assessment, pt with mild pitting generalized edema.  Medications reviewed and include: Ensure Enlive BID, Dulcolax, Colace, SSI, Levemir 5 units q 12 hours, Protonix, Klor-con 40 mEq x 2, spironolactone, torsemide, IV abx IVF: NS @ 10 ml/hr  Labs reviewed: potassium 3.3 CBG's: 92-107 x 24 hours  UOP: 2560 ml x 24 hours CT: 205 ml x 24 hours I/O's: -4.2 L since admit  Diet Order:   Diet Order            Diet heart healthy/carb modified Room service appropriate? Yes; Fluid consistency: Thin  Diet effective now              EDUCATION NEEDS:   Education needs have been addressed  Skin:  Skin Assessment: Skin Integrity Issues: Skin Integrity Issues: Incisions: closed incisions to chest and left leg  Last BM:  12/10/18 type 6  Height:   Ht Readings from Last 1 Encounters:  12/01/18 5\' 9"  (1.753 m)    Weight:   Wt Readings from Last 1 Encounters:  12/10/18 87.4 kg    Ideal  Body Weight:  72.7 kg  BMI:  Body mass index is 28.45 kg/m.  Estimated Nutritional Needs:   Kcal:  2100-2300  Protein:  110-130 grams  Fluid:  >/= 1.8 L    Gaynell Face, MS, RD, LDN Inpatient Clinical Dietitian Pager: [phone] Weekend/After Hours: [phone]

## 2018-12-10 NOTE — Discharge Instructions (Signed)
Discharge Instructions: ° °1. You may shower, please wash incisions daily with soap and water and keep dry.  If you wish to cover wounds with dressing you may do so but please keep clean and change daily.  No tub baths or swimming until incisions have completely healed.  If your incisions become red or develop any drainage please call our office at [phone] ° °2. No Driving until cleared by Dr. Atkins office and you are no longer using narcotic pain medications ° °3. Monitor your weight daily.. Please use the same scale and weigh at same time... If you gain 5-10 lbs in 48 hours with associated lower extremity swelling, please contact our office at [phone] ° °4. Fever of 101.5 for at least 24 hours with no source, please contact our office at [phone] ° °5. Activity- up as tolerated, please walk at least 3 times per day.  Avoid strenuous activity, no lifting, pushing, or pulling with your arms over 8-10 lbs for a minimum of 6 weeks ° °6. If any questions or concerns arise, please do not hesitate to contact our office at [phone] ° ° °Prediabetes Eating Plan °Prediabetes is a condition that causes blood sugar (glucose) levels to be higher than normal. This increases the risk for developing diabetes. In order to prevent diabetes from developing, your health care provider may recommend a diet and other lifestyle changes to help you: °· Control your blood glucose levels. °· Improve your cholesterol levels. °· Manage your blood pressure. °Your health care provider may recommend working with a diet and nutrition specialist (dietitian) to make a meal plan that is best for you. °What are tips for following this plan? °Lifestyle °· Set weight loss goals with the help of your health care team. It is recommended that most people with prediabetes lose 7% of their current body weight. °· Exercise for at least 30 minutes at least 5 days a week. °· Attend a support group or seek ongoing support from a mental health  counselor. °· Take over-the-counter and prescription medicines only as told by your health care provider. °Reading food labels °· Read food labels to check the amount of fat, salt (sodium), and sugar in prepackaged foods. Avoid foods that have: °? Saturated fats. °? Trans fats. °? Added sugars. °· Avoid foods that have more than 300 milligrams (mg) of sodium per serving. Limit your daily sodium intake to less than 2,300 mg each day. °Shopping °· Avoid buying pre-made and processed foods. °Cooking °· Cook with olive oil. Do not use butter, lard, or ghee. °· Bake, broil, grill, or boil foods. Avoid frying. °Meal planning ° °· Work with your dietitian to develop an eating plan that is right for you. This may include: °? Tracking how many calories you take in. Use a food diary, notebook, or mobile application to track what you eat at each meal. °? Using the glycemic index (GI) to plan your meals. The index tells you how quickly a food will raise your blood glucose. Choose low-GI foods. These foods take a longer time to raise blood glucose. °· Consider following a Mediterranean diet. This diet includes: °? Several servings each day of fresh fruits and vegetables. °? Eating fish at least twice a week. °? Several servings each day of whole grains, beans, nuts, and seeds. °? Using olive oil instead of other fats. °? Moderate alcohol consumption. °? Eating small amounts of red meat and whole-fat dairy. °· If you have high blood pressure, you may need to   limit your sodium intake or follow a diet such as the DASH eating plan. DASH is an eating plan that aims to lower high blood pressure. °What foods are recommended? °The items listed below may not be a complete list. Talk with your dietitian about what dietary choices are best for you. °Grains °Whole grains, such as whole-wheat or whole-grain breads, crackers, cereals, and pasta. Unsweetened oatmeal. Bulgur. Barley. Quinoa. Brown rice. Corn or whole-wheat flour tortillas or  taco shells. °Vegetables °Lettuce. Spinach. Peas. Beets. Cauliflower. Cabbage. Broccoli. Carrots. Tomatoes. Squash. Eggplant. Herbs. Peppers. Onions. Cucumbers. Brussels sprouts. °Fruits °Berries. Bananas. Apples. Oranges. Grapes. Papaya. Mango. Pomegranate. Kiwi. Grapefruit. Cherries. °Meats and other protein foods °Seafood. Poultry without skin. Lean cuts of pork and beef. Tofu. Eggs. Nuts. Beans. °Dairy °Low-fat or fat-free dairy products, such as yogurt, cottage cheese, and cheese. °Beverages °Water. Tea. Coffee. Sugar-free or diet soda. Seltzer water. Lowfat or no-fat milk. Milk alternatives, such as soy or almond milk. °Fats and oils °Olive oil. Canola oil. Sunflower oil. Grapeseed oil. Avocado. Walnuts. °Sweets and desserts °Sugar-free or low-fat pudding. Sugar-free or low-fat ice cream and other frozen treats. °Seasoning and other foods °Herbs. Sodium-free spices. Mustard. Relish. Low-fat, low-sugar ketchup. Low-fat, low-sugar barbecue sauce. Low-fat or fat-free mayonnaise. °What foods are not recommended? °The items listed below may not be a complete list. Talk with your dietitian about what dietary choices are best for you. °Grains °Refined white flour and flour products, such as bread, pasta, snack foods, and cereals. °Vegetables °Canned vegetables. Frozen vegetables with butter or cream sauce. °Fruits °Fruits canned with syrup. °Meats and other protein foods °Fatty cuts of meat. Poultry with skin. Breaded or fried meat. Processed meats. °Dairy °Full-fat yogurt, cheese, or milk. °Beverages °Sweetened drinks, such as sweet iced tea and soda. °Fats and oils °Butter. Lard. Ghee. °Sweets and desserts °Baked goods, such as cake, cupcakes, pastries, cookies, and cheesecake. °Seasoning and other foods °Spice mixes with added salt. Ketchup. Barbecue sauce. Mayonnaise. °Summary °· To prevent diabetes from developing, you may need to make diet and other lifestyle changes to help control blood sugar, improve  cholesterol levels, and manage your blood pressure. °· Set weight loss goals with the help of your health care team. It is recommended that most people with prediabetes lose 7 percent of their current body weight. °· Consider following a Mediterranean diet that includes plenty of fresh fruits and vegetables, whole grains, beans, nuts, seeds, fish, lean meat, low-fat dairy, and healthy oils. °This information is not intended to replace advice given to you by your health care provider. Make sure you discuss any questions you have with your health care provider. °Document Released: 07/05/2014 Document Revised: 06/12/2018 Document Reviewed: 04/24/2016 °Elsevier Patient Education © 2020 [employer] ° °

## 2018-12-10 NOTE — Plan of Care (Signed)
  Problem: Clinical Measurements: Goal: Ability to maintain clinical measurements within normal limits will improve Outcome: Progressing   

## 2018-12-10 NOTE — Progress Notes (Signed)
Pharmacy Antibiotic Note  Curtis [name] is a 68 y.o. male admitted on 11/23/2018 with cardiogenic shock. Now s/p CABG with MVR complicated by postop bleeding, hemothorax, and hemorrhagic shock s/p evacuation of L hemothorax. Antibiotics initiated on 10/5 for concern for HCAP. Pharmacy has been consulted for vancomycin and cefepime dosing. Plan for at least 5 days of therapy per MD.  PCT trending down 0.45>>0.21. WBC trending down to 12.3 today and pt has been afebrile.  Allergy noted to penicillin but reaction only listed as rash during childhood. Has tolerated cephalosporin without any issues. Will add comment to allergy listed.   Plan: Continue cefepime 2 g IV q8h Continue vancomycin 1750 mg IV Q 24 hrs. Goal AUC 400-550. Expected AUC: 485 SCr used: 1.2 May be able to complete antibiotics tomorrow - will discuss with MD.  Height: 5\' 9"  (175.3 cm) Weight: 192 lb 10.9 oz (87.4 kg) IBW/kg (Calculated) : 70.7  Temp (24hrs), Avg:97.7 F (36.5 C), Min:96.6 F (35.9 C), Max:98.4 F (36.9 C)  Recent Labs  Lab 12/06/18 0428 12/07/18 0428 12/08/18 0750 12/09/18 0424 12/10/18 0750  WBC 18.4* 26.9* 17.3* 12.8* 12.3*  CREATININE 1.16 1.20 1.16 1.13 1.01    Estimated Creatinine Clearance: 79.8 mL/min (by C-G formula based on SCr of 1.01 mg/dL).    Allergies  Allergen Reactions  . Penicillins Rash and Other (See Comments)    Did it involve swelling of the face/tongue/throat, SOB, or low BP? Unknown Did it involve sudden or severe rash/hives, skin peeling, or any reaction on the inside of your mouth or nose? Yes Did you need to seek medical attention at a hospital or doctor's office? Unknown When did it last happen? childhood If all above answers are "NO", may proceed with cephalosporin use.     Antimicrobials this admission: Levofloxacin 9/28-9/29 + vanc 9/28, 10/2 periop abx Vancomycin 10/5 >> Cefepime 10/5 >>  Dose adjustments this admission: None  Microbiology  results: 9/21 COVID negative 9/23 MRSA PCR 10/5 BCx: ngtd 10/5 UCx: ngF  Thank you for allowing pharmacy to be a part of this patient's care.  Vertis Kelch, PharmD PGY2 Cardiology Pharmacy Resident Phone [phone] 12/10/2018       2:40 PM  Please check [email or web address] for unit-specific pharmacist phone numbers

## 2018-12-10 NOTE — Consult Note (Signed)
Inpatient Rehabilitation Admissions Coordinator  Inpatient rehab consult received. I met with patient at bedside for rehab assessment. Patient up with PT and OT for the first time yesterday. Pt's neighbors are checking in on his mother at his home daily for she has mild dementia. I discussed goals and expectations of an inpt rehab admit with ELOS of 2 weeks or less and then needing caregiver support at home for patient after an inpt rehab admit. I contacted his NOK/ neighbor, Randall Hiss by phone with his permission. I discussed the need for caregiver support for patient after a short CIR admit if we were to consider him for admit vs SNF rehab for a longer recuperation before return home. Eric to discuss with patient and then to follow up with me with decision. Patient to be transferred to [facility] today. I will follow up tomorrow with possible CIR admit VS SNF placement .   Danne Baxter, RN, MSN Rehab Admissions Coordinator [phone] 12/10/2018 10:17 AM

## 2018-12-10 NOTE — Accreditation Note (Signed)
Physical Medicine and Rehabilitation Admission H&P    Chief Complaint  Patient presents with   Respiratory Distress  : HPI: Curtis [name] is a 68 year old right-handed male with history of tobacco abuse on no prescription medications.  History taken from chart review and patient due to delayed processing.  Per chart review he lives with elderly parent. He is retired. Independent prior to admission to level home with 3 steps to entry.  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  Presented 11/24/2018 with worsening SOB, tachycardia, and tachypnea. Oxygen saturations 60% on room air.  There were reports of multiple syncopal episodes in route but patient never lost pulses.  He did receive epinephrine 0.3 mg as well as Solu-Medrol.  Patient placed on BiPAP, high-sensitivity troponin 63.  ECG showed narrow complex tachycardia. BUN 16, creatinine 1.4, COVID negative.  Chest x-ray showed mild cardiomegaly and moderate pulmonary edema.  Echocardiogram with EF ~25%. Underwent cardiac catheterization per Dr. Daneen Schick showing severe multivessel CAD with 40% tubular left main.  60 to 70% proximal LAD tandem lesions.  Severe 80% diffuse disease in the large first diagonal.  Underwent CABG x3 as well as mitral valve replacement 11/30/2018 per Dr. Orvan Seen of [facility].  Postoperative cardiogenic shock with mediastinal bleeding and patient returned emergently to the OR and underwent removal of Impella left ventricular assistive device evacuation of left hemothorax 12/04/2018.  Patient was extubated 12/05/2018.  CT negative for pulmonary emboli or significant effusion or infiltrate.  Hospital course further complicated by pain and ABLA, 10.1 as well as leukocytosis 17,300 improved to 15,900 and patient had been maintained on empiric vancomycin and cefepime.  Urine culture and blood cultures no growth.  Subcutaneous Lovenox for DVT prophylaxis.  Patient is currently maintained also on aspirin 325 mg daily after  CABG.  Patient had initially been placed on milrinone transition to amiodarone 200 mg twice daily.  Findings of mildly elevated hemoglobin A1c 5.8 with sliding scale insulin and low-dose Levemir added.  Patient is tolerating a regular consistency diet.  There have been intermittent bouts of confusion that is steadily improve postoperatively with therapies initiated and sternal precautions as directed.  Therapy evaluations completed and patient was admitted for a comprehensive rehab program.  Please see preadmission assessment from earlier today as well.  Review of Systems  Constitutional: Positive for malaise/fatigue. Negative for chills and fever.  HENT: Negative for hearing loss.   Eyes: Negative for blurred vision and double vision.  Respiratory: Positive for shortness of breath.   Gastrointestinal: Positive for constipation. Negative for heartburn, nausea and vomiting.  Genitourinary: Negative for dysuria, flank pain and hematuria.  Skin: Negative for rash.  All other systems reviewed and are negative.  Past Medical History:  Diagnosis Date   Coronary artery disease    Elevated troponin 11/25/2018   Tobacco use    Past Surgical History:  Procedure Laterality Date   CORONARY ARTERY BYPASS GRAFT N/A 11/30/2018   Procedure: MEDIASTINAL POST-OP BLEED;  Surgeon: Wonda Olds, MD;  Location: [place];  Service: Open Heart Surgery;  Laterality: N/A;   CORONARY ARTERY BYPASS GRAFT N/A 11/30/2018   Procedure: CORONARY ARTERY BYPASS GRAFTING (CABG) x Three , using left internal mammary artery and left leg greater saphenous vein harvested endoscopically;  Surgeon: Wonda Olds, MD;  Location: [place];  Service: Open Heart Surgery;  Laterality: N/A;   LEFT HEART CATH AND CORONARY ANGIOGRAPHY N/A 11/24/2018   Procedure: LEFT HEART CATH AND CORONARY  ANGIOGRAPHY;  Surgeon: Belva Crome, MD;  Location: [facility];  Service: Cardiovascular;  Laterality: N/A;   MITRAL VALVE REPAIR N/A  11/30/2018   Procedure: MITRAL VALVE REPLACEMENT (MVR) USING MAGNA EASE SIZE 60m;  Surgeon: AWonda Olds MD;  [place]  Service: Open Heart Surgery;  Laterality: N/A;   PLACEMENT OF IMPELLA LEFT VENTRICULAR ASSIST DEVICE N/A 11/30/2018   Procedure: PLACEMENT OF IMPELLA LEFT VENTRICULAR ASSIST DEVICE;  Surgeon: AWonda Olds MD;  [place]  Service: Open Heart Surgery;  Laterality: N/A;   REMOVAL OF IMPELLA LEFT VENTRICULAR ASSIST DEVICE N/A 12/04/2018   Procedure: REMOVAL OF IMPELLA LEFT VENTRICULAR ASSIST DEVICE.  EVACUATION OF LEFT HEMOTHORAX;  Surgeon: AWonda Olds MD;  [place] [facility]  Service: Open Heart Surgery;  Laterality: N/A;   STERNAL INCISION RECLOSURE  12/04/2018   Procedure: Sternal Plating with Sternal Rewiring;  Surgeon: AWonda Olds MD;  [place] [facility];  Service: Open Heart Surgery;;   TEE WITHOUT CARDIOVERSION N/A 11/26/2018   Procedure: TRANSESOPHAGEAL ECHOCARDIOGRAM (TEE);  Surgeon: SJerline Pain MD;  [facility]  Service: Cardiovascular;  Laterality: N/A;   TEE WITHOUT CARDIOVERSION N/A 11/30/2018   Procedure: TRANSESOPHAGEAL ECHOCARDIOGRAM (TEE);  Surgeon: AWonda Olds MD;  [place] [facility]  Service: Open Heart Surgery;  Laterality: N/A;   TEE WITHOUT CARDIOVERSION N/A 12/04/2018   Procedure: TRANSESOPHAGEAL ECHOCARDIOGRAM (TEE);  Surgeon: AWonda Olds MD;  [place]  Service: Open Heart Surgery;  Laterality: N/A;   Family History  Problem Relation Age of Onset   Heart attack Father    Heart attack Mother    Social History:  reports that he has been smoking cigarettes. He has been smoking about 0.25 packs per day. He has never used smokeless tobacco. He reports that he does not drink alcohol or use drugs. Allergies:  Allergies  Allergen Reactions   Penicillins Rash and Other (See Comments)    Tolerated cefepime 12/2018  Did it involve swelling of the face/tongue/throat, SOB, or low BP?  Unknown Did it involve sudden or severe rash/hives, skin peeling, or any reaction on the inside of your mouth or nose? Yes Did you need to seek medical attention at a hospital or doctor's office? Unknown When did it last happen? childhood If all above answers are "NO", may proceed with cephalosporin use.    No medications prior to admission.    Drug Regimen Review Drug regimen was reviewed and remains appropriate with no significant issues identified  Home: Home Living Family/patient expects to be discharged to:: Private residence Living Arrangements: Parent(Pt care for mother with Alzheimers) Available Help at Discharge: Family, Neighbor, Available PRN/intermittently Type of Home: House Home Access: Stairs to enter ECenter[employer]of Steps: 2-3 Entrance Stairs-Rails: Right Home Layout: Two level Alternate Level Stairs-Number of Steps: Flight Alternate Level Stairs-Rails: Right Bathroom Shower/Tub: TChiropodist Standard Home Equipment: None   Functional History: Prior Function Level of Independence: Independent Comments: Independent with mobility, retired from work; 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, 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  Functional Status:  Mobility: Bed Mobility Overal bed mobility: Needs Assistance Bed Mobility: Sit to Supine, Sidelying to Sit, Rolling Rolling: Modified independent (Device/Increase time) Sidelying to sit: Mod assist Supine to sit: Mod assist Sit to supine: Supervision General bed mobility comments: ModA to assist trunk elevation while maintaining sternal precautions, pt attempting to power into long sitting, cued for log roll Transfers Overall transfer level:  Needs assistance Equipment used: 1 person hand held assist Transfers: Sit to/from Stand Sit to Stand: Mod assist General transfer comment: Cued for hand placement to maintain sternal precautions while standing, pt still reaching for  UE support, reliant on HHA and modA to stand Ambulation/Gait Ambulation/Gait assistance: Mod assist Gait Distance (Feet): 32 Feet Assistive device: 1 person hand held assist Gait Pattern/deviations: Step-to pattern, Staggering left, Staggering right, Wide base of support General Gait Details: Slow, very unsteady (almost ataxic-like) gait with RUE HHA and frequent modA to maintain balance; pt reaching for LUE support on furniture/walls; 1x standing rest break at sink and 1x seated rest break; pt with difficulty multitasking while walking; poor insight into balance deficits Gait velocity: Decreased Gait velocity interpretation: <1.31 ft/sec, indicative of household ambulator    ADL: ADL Overall ADL's : Needs assistance/impaired Eating/Feeding: Set up, Sitting Grooming: Wash/dry face, Set up, Sitting Upper Body Bathing: Moderate assistance, Sitting Lower Body Bathing: Maximal assistance, Sitting/lateral leans Upper Body Dressing : Moderate assistance, Sitting Lower Body Dressing: Moderate assistance, +2 for physical assistance Toilet Transfer: Minimal assistance, +2 for physical assistance, BSC Toileting- Clothing Manipulation and Hygiene: Maximal assistance, Sit to/from stand Functional mobility during ADLs: Minimal assistance, +2 for safety/equipment General ADL Comments: initiated sternal precautions with application to ADL tasks  Cognition: Cognition Overall Cognitive Status: Impaired/Different from baseline Orientation Level: Oriented X4, Oriented to person, Oriented to place, Oriented to time, Oriented to situation Cognition Arousal/Alertness: Awake/alert Behavior During Therapy: Flat affect Overall Cognitive Status: Impaired/Different from baseline Area of Impairment: Attention, Following commands, Safety/judgement, Problem solving Current Attention Level: Selective Following Commands: Follows multi-step commands inconsistently Safety/Judgement: Decreased awareness of  deficits, Decreased awareness of safety Problem Solving: Requires verbal cues General Comments: More alert this session  Physical Exam: Blood pressure 110/60, pulse 90, temperature 98.2 F (36.8 C), temperature source Oral, resp. rate (!) 21, height _0  (1.753 m), weight 85.8 kg, SpO2 94 %. Physical Exam  Vitals reviewed. Constitutional: He is oriented to person, place, and time. He appears well-developed and well-nourished.  HENT:  Head: Normocephalic and atraumatic.  Eyes: EOM are normal. Right eye exhibits no discharge. Left eye exhibits no discharge.  Neck: No tracheal deviation present. No thyromegaly present.  Respiratory: Effort normal. No respiratory distress.  GI: He exhibits no distension.  Musculoskeletal:     Comments: No edema or tenderness in extremities  Neurological: He is alert and oriented to person, place, and time.  Provides his name and age with some delay and at times decrease attention.   Follows simple commands. Motor: 4-/5 throughout  Skin:  Midline large chest incision clean and dry  Psychiatric: His affect is blunt. He is slowed.    Results for orders placed or performed during the hospital encounter of 11/23/18 (from the past 48 hour(s))  Glucose, capillary     Status: None   Collection Time: 12/09/18  8:09 AM  Result Value Ref Range   Glucose-Capillary 92 70 - 99 mg/dL  Glucose, capillary     Status: Abnormal   Collection Time: 12/09/18 11:40 AM  Result Value Ref Range   Glucose-Capillary 107 (H) 70 - 99 mg/dL  Glucose, capillary     Status: Abnormal   Collection Time: 12/09/18  3:58 PM  Result Value Ref Range   Glucose-Capillary 107 (H) 70 - 99 mg/dL  Glucose, capillary     Status: Abnormal   Collection Time: 12/09/18  8:21 PM  Result Value Ref Range   Glucose-Capillary 101 (H) 70 -  99 mg/dL  Glucose, capillary     Status: None   Collection Time: 12/10/18 12:41 AM  Result Value Ref Range   Glucose-Capillary 99 70 - 99 mg/dL  Glucose,  capillary     Status: Abnormal   Collection Time: 12/10/18  3:27 AM  Result Value Ref Range   Glucose-Capillary 104 (H) 70 - 99 mg/dL  .Cooxemetry Panel (carboxy, met, total hgb, O2 sat)     Status: Abnormal   Collection Time: 12/10/18  5:00 AM  Result Value Ref Range   Total hemoglobin 10.3 (L) 12.0 - 16.0 g/dL   O2 Saturation 58.8 %   Carboxyhemoglobin 2.0 (H) 0.5 - 1.5 %   Methemoglobin 0.6 0.0 - 1.5 %  Magnesium     Status: None   Collection Time: 12/10/18  5:30 AM  Result Value Ref Range   Magnesium 2.2 1.7 - 2.4 mg/dL    Comment: Performed at [facility], [place] [address], [place], [place] [address]  Basic metabolic panel     Status: Abnormal   Collection Time: 12/10/18  7:50 AM  Result Value Ref Range   Sodium 136 135 - 145 mmol/L   Potassium 3.3 (L) 3.5 - 5.1 mmol/L   Chloride 104 98 - 111 mmol/L   CO2 22 22 - 32 mmol/L   Glucose, Bld 94 70 - 99 mg/dL   BUN 18 8 - 23 mg/dL   Creatinine, Ser 1.01 0.61 - 1.24 mg/dL   Calcium 7.4 (L) 8.9 - 10.3 mg/dL   GFR calc non Af Amer >60 >60 mL/min   GFR calc Af Amer >60 >60 mL/min   Anion gap 10 5 - 15    Comment: Performed at [facility], [place] [address], [place], [place] [address]  CBC     Status: Abnormal   Collection Time: 12/10/18  7:50 AM  Result Value Ref Range   WBC 12.3 (H) 4.0 - 10.5 K/uL   RBC 3.35 (L) 4.22 - 5.81 MIL/uL   Hemoglobin 10.1 (L) 13.0 - 17.0 g/dL   HCT 32.3 (L) 39.0 - 52.0 %   MCV 96.4 80.0 - 100.0 fL   MCH 30.1 26.0 - 34.0 pg   MCHC 31.3 30.0 - 36.0 g/dL   RDW 19.1 (H) 11.5 - 15.5 %   Platelets 329 150 - 400 K/uL   nRBC 0.0 0.0 - 0.2 %    Comment: Performed at [facility], [place] [address], [place],  [address]  Glucose, capillary     Status: Abnormal   Collection Time: 12/10/18  8:02 AM  Result Value Ref Range   Glucose-Capillary 103 (H) 70 - 99 mg/dL  Glucose, capillary     Status: None   Collection Time: 12/10/18 11:37 AM  Result Value Ref Range   Glucose-Capillary 92 70  - 99 mg/dL  Glucose, capillary     Status: Abnormal   Collection Time: 12/10/18  4:25 PM  Result Value Ref Range   Glucose-Capillary 100 (H) 70 - 99 mg/dL  Glucose, capillary     Status: Abnormal   Collection Time: 12/10/18  8:06 PM  Result Value Ref Range   Glucose-Capillary 163 (H) 70 - 99 mg/dL   Comment 1 Notify RN   Glucose, capillary     Status: None   Collection Time: 12/10/18 11:44 PM  Result Value Ref Range   Glucose-Capillary 81 70 - 99 mg/dL   Comment 1 Notify RN    Comment 2 Document in  Chart   Glucose, capillary     Status: Abnormal   Collection Time: 12/11/18  3:26 AM  Result Value Ref Range   Glucose-Capillary 108 (H) 70 - 99 mg/dL   Comment 1 Notify RN    Comment 2 Document in Chart   Basic metabolic panel     Status: Abnormal   Collection Time: 12/11/18  3:30 AM  Result Value Ref Range   Sodium 135 135 - 145 mmol/L   Potassium 3.9 3.5 - 5.1 mmol/L   Chloride 102 98 - 111 mmol/L   CO2 22 22 - 32 mmol/L   Glucose, Bld 118 (H) 70 - 99 mg/dL   BUN 24 (H) 8 - 23 mg/dL   Creatinine, Ser 1.24 0.61 - 1.24 mg/dL   Calcium 7.9 (L) 8.9 - 10.3 mg/dL   GFR calc non Af Amer >60 >60 mL/min   GFR calc Af Amer >60 >60 mL/min   Anion gap 11 5 - 15    Comment: Performed at [facility], [place] [address], [place], [place] [address]  CBC     Status: Abnormal   Collection Time: 12/11/18  3:30 AM  Result Value Ref Range   WBC 15.9 (H) 4.0 - 10.5 K/uL   RBC 3.58 (L) 4.22 - 5.81 MIL/uL   Hemoglobin 10.8 (L) 13.0 - 17.0 g/dL   HCT 34.4 (L) 39.0 - 52.0 %   MCV 96.1 80.0 - 100.0 fL   MCH 30.2 26.0 - 34.0 pg   MCHC 31.4 30.0 - 36.0 g/dL   RDW 18.8 (H) 11.5 - 15.5 %   Platelets 362 150 - 400 K/uL   nRBC 0.0 0.0 - 0.2 %    Comment: Performed at [facility] [address] [place], [place] [address]   [place] Ekg Site Rite  Result Date: 12/10/2018 If [employer] image not attached, placement could not be confirmed due to current cardiac rhythm.      Medical Problem  List and Plan: 1.  Debility secondary to CAD with non-STEMI status post CABG with MVR 11/30/2018 with postoperative mediastinal bleeding/cardiogenic shock/acute respiratory failure with removal of Impella left ventricular assistive device evacuation of left hemothorax 12/04/2018.  Sternal precautions  Admit to CIR 2.  Antithrombotics: -DVT/anticoagulation: Lovenox.  Monitor for any bleeding episodes  -antiplatelet therapy: Aspirin 325 mg daily 3. Pain Management: Tramadol as needed 4. Mood: Provide emotional support  -antipsychotic agents: N/A 5. Neuropsych: This patient is?  Fully capable of making decisions on his own behalf. 6. Skin/Wound Care: Routine skin checks 7. Fluids/Electrolytes/Nutrition: Routine in and outs.  CMP ordered 8.  Acute blood loss anemia.  CBC ordered 9.  Hypertension.   Entresto 24-26 mg twice daily, Aldactone 25 mg daily, Demadex 20 mg daily.  Monitor with increased mobility 10.  PAF.  Amiodarone 200 mg daily.  Cardiac rate controlled.  Monitor with increased physical exertion. 11.  Leukocytosis.  Improved.  Urine culture blood cultures no growth to date.  Empiric vancomycin and cefepime discontinued.  Repeat CBC ordered. 12.  Prediabetes.  Levemir 5 units every 12 hours.  Consider transition to oral agent if possible.  Diabetic teaching.  Monitor with increased mobility. 13.  Combined CHF  EF of 25-30%  Daily weights  Cathlyn Parsons, PA-C 12/11/2018  I have personally performed a face to face diagnostic evaluation, including, but not limited to relevant history and physical exam findings, of this patient and developed relevant assessment and plan.  Additionally, I have  reviewed and concur with the physician assistant's documentation above.  Delice Lesch, MD, ABPMR

## 2018-12-10 NOTE — Progress Notes (Addendum)
Patient ID: Curtis [name], male   DOB: 12-17-1950, 68 y.o.   MRN: [id number]    Advanced Heart Failure Rounding Note   Subjective:    Impella removed + evacuation of hemothorax 10/2.  Extubated and Swan removed 10/3.   Yesterday milrinone stopped. CO-OX stable. CVP 5-6  Denies CP or SOB. Remains in NSR  PT recommending CIR.    Objective:   Weight Range:  Vital Signs:   Temp:  [96.9 F (36.1 C)-98.4 F (36.9 C)] 98.4 F (36.9 C) (10/08 0421) Pulse Rate:  [81-90] 86 (10/08 0700) Resp:  [16-27] 25 (10/08 0700) BP: (100-133)/(56-104) 122/67 (10/08 0700) SpO2:  [99 %-100 %] 100 % (10/08 0700) Weight:  [87.4 kg] 87.4 kg (10/08 0500) Last BM Date: 12/10/18  Weight change: Filed Weights   12/08/18 0446 12/09/18 0413 12/10/18 0500  Weight: 92.2 kg 91.7 kg 87.4 kg    Intake/Output:   Intake/Output Summary (Last 24 hours) at 12/10/2018 0739 Last data filed at 12/10/2018 0600 Gross per 24 hour  Intake 1458.09 ml  Output 2765 ml  Net -1306.91 ml     Physical Exam: CVP 4 General:  No resp difficulty. In bed.  HEENT: normal aniceric  Neck: supple. no JVD. Carotids 2+ bilat; no bruits. No lymphadenopathy or thryomegaly appreciated. RIJ Cor: sternal wound ok PMI nondisplaced. Regular rate & rhythm. No rubs, gallops or murmurs. Staples sternum Lungs: clear dull at bases Abdomen: soft, nontender, nondistended. No hepatosplenomegaly. No bruits or masses. Good bowel sounds. Extremities: no cyanosis, clubbing, rash, edema Neuro: alert & oriented x 3, cranial nerves grossly intact. moves all 4 extremities w/o difficulty. Affect flat  Telemetry: NSR 80s Personally reviewed  Labs: Basic Metabolic Panel: Recent Labs  Lab 12/05/18 0401  12/06/18 0428  12/07/18 0428 12/07/18 0544 12/07/18 0943 12/08/18 0343 12/08/18 0750 12/09/18 0424 12/10/18 0530  NA 132*   < > 133*   < > 134* 134* 133*  --  133* 132*  --   K 3.8   < > 3.2*   < > 3.4* 3.5 3.6  --  3.1* 3.1*  --   CL 104  --   97*  --  95*  --   --   --  99 102  --   CO2 19*  --  26  --  27  --   --   --  24 21*  --   GLUCOSE 131*  --  140*  --  162*  --   --   --  131* 96  --   BUN 17  --  17  --  18  --   --   --  20 18  --   CREATININE 1.23  --  1.16  --  1.20  --   --   --  1.16 1.13  --   CALCIUM 7.2*  --  7.6*  --  7.6*  --   --   --  7.5* 7.4*  --   MG  --   --  1.9  --  1.9  --   --  2.2  --  2.1 2.2   < > = values in this interval not displayed.    Liver Function Tests: Recent Labs  Lab 12/06/18 0428 12/07/18 0428  AST 19 18  ALT 21 19  ALKPHOS 58 63  BILITOT 3.4* 2.6*  PROT 5.0* 5.0*  ALBUMIN 2.4* 2.3*   No results for input(s): LIPASE, AMYLASE in the last 168 hours.  No results for input(s): AMMONIA in the last 168 hours.  CBC: Recent Labs  Lab 12/04/18 1642  12/05/18 0401  12/06/18 0428  12/07/18 0428 12/07/18 0544 12/07/18 0943 12/08/18 0750 12/09/18 0424  WBC 24.5*  --  24.7*  --  18.4*  --  26.9*  --   --  17.3* 12.8*  NEUTROABS 18.9*  --   --   --   --   --   --   --   --   --  9.9*  HGB 9.3*   < > 9.7*   < > 9.7*   < > 9.8* 10.2* 9.9* 9.0* 9.2*  HCT 26.6*   < > 28.2*   < > 28.8*   < > 29.5* 30.0* 29.0* 28.7* 29.4*  MCV 89.3  --  90.1  --  91.4  --  93.4  --   --  97.0 96.4  PLT 121*  --  128*  --  127*  --  182  --   --  229 262   < > = values in this interval not displayed.    Cardiac Enzymes: No results for input(s): CKTOTAL, CKMB, CKMBINDEX, TROPONINI in the last 168 hours.  BNP: BNP (last 3 results) Recent Labs    11/23/18 2328  BNP 507.9*    ProBNP (last 3 results) No results for input(s): PROBNP in the last 8760 hours.    Other results:  Imaging: Dg Chest Port 1 View  Result Date: 12/09/2018 CLINICAL DATA:  Pleural effusion.  CABG 5 days ago. EXAM: PORTABLE CHEST 1 VIEW COMPARISON:  CTA chest 12/07/2018 and one-view chest 12/07/2018. FINDINGS: Heart is enlarged. Moderate pulmonary vascular congestion is noted. A left-sided chest tube is in place.  Bilateral chest tubes are in place. There is no pneumothorax. Right IJ sheath remains. A mediastinal drain is in place. IMPRESSION: 1. Cardiomegaly and moderate pulmonary vascular congestion. 2. Low lung volumes. 3. Support apparatus is stable.  No pneumothorax. Electronically Signed   By: Marin Roberts M.D.   On: 12/09/2018 08:17     Medications:     Scheduled Medications: . amiodarone  200 mg Oral BID  . aspirin EC  325 mg Oral Daily   Or  . aspirin  324 mg Per Tube Daily  . bisacodyl  10 mg Oral Daily   Or  . bisacodyl  10 mg Rectal Daily  . chlorhexidine  15 mL Mouth Rinse BID  . Chlorhexidine Gluconate Cloth  6 each Topical Daily  . docusate sodium  200 mg Oral Daily  . enoxaparin (LOVENOX) injection  40 mg Subcutaneous Q24H  . feeding supplement (ENSURE ENLIVE)  237 mL Oral BID BM  . guaiFENesin  600 mg Oral BID  . insulin aspart  2-6 Units Subcutaneous Q4H  . insulin detemir  5 Units Subcutaneous Q12H  . levalbuterol  0.63 mg Nebulization BID  . losartan  12.5 mg Oral Daily  . pantoprazole  40 mg Oral Daily  . sodium chloride flush  3 mL Intravenous Q12H  . spironolactone  25 mg Oral Daily  . torsemide  40 mg Oral Daily    Infusions: . sodium chloride Stopped (12/04/18 1251)  . sodium chloride 10 mL/hr at 12/10/18 0600  . sodium chloride Stopped (12/09/18 0515)  . ceFEPime (MAXIPIME) IV Stopped (12/10/18 0340)  . epinephrine Stopped (12/08/18 0955)  . lactated ringers    . lactated ringers 10 mL/hr at 12/07/18 0500  . nitroGLYCERIN Stopped (12/03/18 2300)  .  norepinephrine (LEVOPHED) Adult infusion Stopped (12/06/18 0331)  . phenylephrine (NEO-SYNEPHRINE) Adult infusion Stopped (12/05/18 0748)  . vancomycin Stopped (12/09/18 1338)    PRN Medications: sodium chloride, sodium chloride, hydrALAZINE, metoprolol tartrate, morphine injection, ondansetron (ZOFRAN) IV, oxyCODONE, traMADol   Assessment/Plan:   1. Post-cardiotomy cardiogenic shock in setting if  severe iCM and post-operative bleeding/hemorrhagic shock - pre-op echo EF 25-30% - s/p CABG & bioprosthetic MVR with Impella placement on 9/28 c/b severe coagulopathy/bleeding requiring OR takeback  - On 10/2 Impella removal and evacuation of left hemothorax CO-OX 59% off milrinone.  Remove central line. Discussed with Dr Vickey SagesAtkins.  - Volume status stable. Continue torsemide 40 mg daily. -> may be able to decrease soon - Continue spiro 25 mg daily - Switch losartan to Entresto   2. Acute hypoxemic respiratory failure - Extubated 9/30 - Evacuation hemothorax in OR 10/2 and re-intubated.  - Extubated 10/3.   - CT negative for PE or significant effusion/infiltrate -On room air.   3. Severe MR - s/p bioprosthetic MVR. Stable   4. Acute blood loss anemia - transfuse to keep hgb >= 8.0 CBC pending.  5. CAD with NSTEMI - s/p CABG 9/28 - on ASA/statin - no s/s ischemia - b-blocker and Plavix when ready  6. Thrombocytopenia - resolved.  7. PAF - Stop milrinone. Stop IV amiodarone.  Maintaining NSR.   8. DVT prophylaxis - Lovenox.   9. Leukocytosis - cultures drawn - PCT 0.45  - empiric vanc /cefipime for now  PT recommending CIR. I personally contacted Ottie GlazierBarbara Boyette.   Length of Stay: 16   Amy Clegg NP-C  12/10/2018, 7:39 AM  Advanced Heart Failure Team Pager [phone] (M-F; 7a - 4p)  Please contact [facility] for night-coverage after hours (4p -7a ) and weekends on [email or web address]  Patient seen and examined with the above-signed Advanced Practice Provider and/or Housestaff. I personally reviewed laboratory data, imaging studies and relevant notes. I independently examined the patient and formulated the important aspects of the plan. I have edited the note to reflect any of my changes or salient points. I have personally discussed the plan with the patient and/or family.  Off milrinone now. Co-ox 59%. Volume status looks good with CVP ~5. Weight about 5 pounds from  baseline.Respiratory status better. Off bipap. Creatinine  stable. K low. Supp K. Switch losartan to Entresto.   Mobilize. IS. ? CIR  Arvilla Meresaniel Bensimhon, MD  12:07 PM

## 2018-12-11 ENCOUNTER — Inpatient Hospital Stay (HOSPITAL_COMMUNITY)
Admission: RE | Admit: 2018-12-11 | Discharge: 2018-12-25 | DRG: 945 | Disposition: A | Discharge status: Home Health | Payer: Medicare Other | Source: Intra-hospital | Attending: Physical Medicine & Rehabilitation | Admitting: Physical Medicine & Rehabilitation

## 2018-12-11 ENCOUNTER — Other Ambulatory Visit: Payer: Self-pay

## 2018-12-11 ENCOUNTER — Encounter (HOSPITAL_COMMUNITY): Payer: Self-pay | Admitting: Cardiothoracic Surgery

## 2018-12-11 ENCOUNTER — Inpatient Hospital Stay (HOSPITAL_COMMUNITY): Payer: Medicare Other

## 2018-12-11 DIAGNOSIS — Z953 Presence of xenogenic heart valve: Secondary | ICD-10-CM

## 2018-12-11 DIAGNOSIS — R52 Pain, unspecified: Secondary | ICD-10-CM | POA: Diagnosis not present

## 2018-12-11 DIAGNOSIS — Z952 Presence of prosthetic heart valve: Secondary | ICD-10-CM

## 2018-12-11 DIAGNOSIS — D72829 Elevated white blood cell count, unspecified: Secondary | ICD-10-CM

## 2018-12-11 DIAGNOSIS — I110 Hypertensive heart disease with heart failure: Secondary | ICD-10-CM | POA: Diagnosis present

## 2018-12-11 DIAGNOSIS — Z951 Presence of aortocoronary bypass graft: Secondary | ICD-10-CM | POA: Diagnosis not present

## 2018-12-11 DIAGNOSIS — I5043 Acute on chronic combined systolic (congestive) and diastolic (congestive) heart failure: Secondary | ICD-10-CM | POA: Diagnosis present

## 2018-12-11 DIAGNOSIS — R5381 Other malaise: Secondary | ICD-10-CM | POA: Diagnosis present

## 2018-12-11 DIAGNOSIS — D62 Acute posthemorrhagic anemia: Secondary | ICD-10-CM | POA: Diagnosis present

## 2018-12-11 DIAGNOSIS — I2510 Atherosclerotic heart disease of native coronary artery without angina pectoris: Secondary | ICD-10-CM | POA: Diagnosis present

## 2018-12-11 DIAGNOSIS — R7303 Prediabetes: Secondary | ICD-10-CM

## 2018-12-11 DIAGNOSIS — E871 Hypo-osmolality and hyponatremia: Secondary | ICD-10-CM | POA: Diagnosis not present

## 2018-12-11 DIAGNOSIS — Z8249 Family history of ischemic heart disease and other diseases of the circulatory system: Secondary | ICD-10-CM | POA: Diagnosis not present

## 2018-12-11 DIAGNOSIS — I480 Paroxysmal atrial fibrillation: Secondary | ICD-10-CM

## 2018-12-11 DIAGNOSIS — I5042 Chronic combined systolic (congestive) and diastolic (congestive) heart failure: Secondary | ICD-10-CM | POA: Diagnosis not present

## 2018-12-11 DIAGNOSIS — I214 Non-ST elevation (NSTEMI) myocardial infarction: Secondary | ICD-10-CM | POA: Diagnosis present

## 2018-12-11 DIAGNOSIS — I10 Essential (primary) hypertension: Secondary | ICD-10-CM

## 2018-12-11 DIAGNOSIS — E1151 Type 2 diabetes mellitus with diabetic peripheral angiopathy without gangrene: Secondary | ICD-10-CM | POA: Diagnosis present

## 2018-12-11 DIAGNOSIS — F17210 Nicotine dependence, cigarettes, uncomplicated: Secondary | ICD-10-CM | POA: Diagnosis present

## 2018-12-11 DIAGNOSIS — D72823 Leukemoid reaction: Secondary | ICD-10-CM | POA: Diagnosis not present

## 2018-12-11 DIAGNOSIS — Z23 Encounter for immunization: Secondary | ICD-10-CM

## 2018-12-11 DIAGNOSIS — R7309 Other abnormal glucose: Secondary | ICD-10-CM

## 2018-12-11 DIAGNOSIS — Z880 Allergy status to penicillin: Secondary | ICD-10-CM

## 2018-12-11 DIAGNOSIS — R7989 Other specified abnormal findings of blood chemistry: Secondary | ICD-10-CM

## 2018-12-11 LAB — BASIC METABOLIC PANEL
Anion gap: 11 (ref 5–15)
BUN: 24 mg/dL — ABNORMAL HIGH (ref 8–23)
CO2: 22 mmol/L (ref 22–32)
Calcium: 7.9 mg/dL — ABNORMAL LOW (ref 8.9–10.3)
Chloride: 102 mmol/L (ref 98–111)
Creatinine, Ser: 1.24 mg/dL (ref 0.61–1.24)
GFR calc Af Amer: >60 mL/min (ref >60)
GFR calc non Af Amer: >60 mL/min (ref >60)
Glucose, Bld: 118 mg/dL — ABNORMAL HIGH (ref 70–99)
Potassium: 3.9 mmol/L (ref 3.5–5.1)
Sodium: 135 mmol/L (ref 135–145)

## 2018-12-11 LAB — GLUCOSE, CAPILLARY
Glucose-Capillary: 108 mg/dL — ABNORMAL HIGH (ref 70–99)
Glucose-Capillary: 130 mg/dL — ABNORMAL HIGH (ref 70–99)

## 2018-12-11 LAB — CBC
HCT: 34.4 % — ABNORMAL LOW (ref 39.0–52.0)
Hemoglobin: 10.8 g/dL — ABNORMAL LOW (ref 13.0–17.0)
MCH: 30.2 pg (ref 26.0–34.0)
MCHC: 31.4 g/dL (ref 30.0–36.0)
MCV: 96.1 fL (ref 80.0–100.0)
Platelets: 362 10*3/uL (ref 150–400)
RBC: 3.58 MIL/uL — ABNORMAL LOW (ref 4.22–5.81)
RDW: 18.8 % — ABNORMAL HIGH (ref 11.5–15.5)
WBC: 15.9 10*3/uL — ABNORMAL HIGH (ref 4.0–10.5)
nRBC: 0 % (ref 0.0–0.2)

## 2018-12-11 MED ORDER — LEVALBUTEROL HCL 0.63 MG/3ML IN NEBU: 0.6300 mg | INHALATION_SOLUTION | Freq: Two times a day (BID) | RESPIRATORY_TRACT | 12 refills | Status: DC

## 2018-12-11 MED ORDER — TORSEMIDE 20 MG PO TABS
20.0000 mg | ORAL_TABLET | Freq: Every day | ORAL | Status: DC
  Administered 2018-12-11: 20 mg via ORAL
  Filled 2018-12-11: qty 1

## 2018-12-11 MED ORDER — ENOXAPARIN SODIUM 40 MG/0.4ML ~~LOC~~ SOLN
40.0000 mg | SUBCUTANEOUS | Status: DC
  Administered 2018-12-11 – 2018-12-13 (×3): 40 mg via SUBCUTANEOUS
  Filled 2018-12-11 (×3): qty 0.4

## 2018-12-11 MED ORDER — SODIUM CHLORIDE 0.9 % IV SOLN: 2.0000 g | Freq: Three times a day (TID) | INTRAVENOUS | Status: DC

## 2018-12-11 MED ORDER — ROSUVASTATIN CALCIUM 40 MG PO TABS: 40.0000 mg | ORAL_TABLET | Freq: Every day | ORAL | Status: DC

## 2018-12-11 MED ORDER — INFLUENZA VAC A&B SA ADJ QUAD 0.5 ML IM PRSY
0.5000 mL | PREFILLED_SYRINGE | INTRAMUSCULAR | Status: DC
  Filled 2018-12-11: qty 0.5

## 2018-12-11 MED ORDER — TORSEMIDE 20 MG PO TABS: 20.0000 mg | ORAL_TABLET | Freq: Every day | ORAL | Status: DC

## 2018-12-11 MED ORDER — AMIODARONE HCL 200 MG PO TABS
200.0000 mg | ORAL_TABLET | Freq: Two times a day (BID) | ORAL | Status: DC
  Administered 2018-12-11 – 2018-12-25 (×28): 200 mg via ORAL
  Filled 2018-12-11 (×28): qty 1

## 2018-12-11 MED ORDER — CHLORHEXIDINE GLUCONATE CLOTH 2 % EX PADS
6.0000 | MEDICATED_PAD | Freq: Every day | CUTANEOUS | Status: DC
  Administered 2018-12-13 – 2018-12-14 (×2): 6 via TOPICAL

## 2018-12-11 MED ORDER — AMIODARONE HCL 200 MG PO TABS: 200.0000 mg | ORAL_TABLET | Freq: Two times a day (BID) | ORAL | Status: DC

## 2018-12-11 MED ORDER — ROSUVASTATIN CALCIUM 20 MG PO TABS
40.0000 mg | ORAL_TABLET | Freq: Every day | ORAL | Status: DC
  Administered 2018-12-12 – 2018-12-24 (×13): 40 mg via ORAL
  Filled 2018-12-11 (×13): qty 2

## 2018-12-11 MED ORDER — POTASSIUM CHLORIDE CRYS ER 20 MEQ PO TBCR
30.0000 meq | EXTENDED_RELEASE_TABLET | Freq: Once | ORAL | Status: AC
  Administered 2018-12-11: 30 meq via ORAL
  Filled 2018-12-11: qty 1

## 2018-12-11 MED ORDER — GUAIFENESIN ER 600 MG PO TB12: 600.0000 mg | ORAL_TABLET | Freq: Two times a day (BID) | ORAL | Status: DC

## 2018-12-11 MED ORDER — SENNOSIDES-DOCUSATE SODIUM 8.6-50 MG PO TABS
1.0000 | ORAL_TABLET | Freq: Two times a day (BID) | ORAL | Status: DC
  Administered 2018-12-11 – 2018-12-14 (×7): 1 via ORAL
  Filled 2018-12-11 (×9): qty 1

## 2018-12-11 MED ORDER — ROSUVASTATIN CALCIUM 20 MG PO TABS: 40.0000 mg | ORAL_TABLET | Freq: Every day | ORAL | Status: DC

## 2018-12-11 MED ORDER — VANCOMYCIN HCL 10 G IV SOLR: 1750.0000 mg | INTRAVENOUS | Status: DC

## 2018-12-11 MED ORDER — PRO-STAT SUGAR FREE PO LIQD: 30.0000 mL | Freq: Every day | ORAL | 0 refills | Status: DC

## 2018-12-11 MED ORDER — TORSEMIDE 20 MG PO TABS
20.0000 mg | ORAL_TABLET | Freq: Every day | ORAL | Status: DC
  Administered 2018-12-12: 20 mg via ORAL
  Filled 2018-12-11: qty 1

## 2018-12-11 MED ORDER — SACUBITRIL-VALSARTAN 24-26 MG PO TABS
1.0000 | ORAL_TABLET | Freq: Two times a day (BID) | ORAL | Status: DC
  Administered 2018-12-11 – 2018-12-25 (×28): 1 via ORAL
  Filled 2018-12-11 (×29): qty 1

## 2018-12-11 MED ORDER — ENOXAPARIN SODIUM 40 MG/0.4ML ~~LOC~~ SOLN: 40.0000 mg | SUBCUTANEOUS | Status: DC

## 2018-12-11 MED ORDER — ASPIRIN 81 MG PO CHEW
324.0000 mg | CHEWABLE_TABLET | Freq: Every day | ORAL | Status: DC
  Administered 2018-12-12 – 2018-12-15 (×2): 324 mg
  Filled 2018-12-11 (×4): qty 4

## 2018-12-11 MED ORDER — ASPIRIN 325 MG PO TBEC: 325.0000 mg | DELAYED_RELEASE_TABLET | Freq: Every day | ORAL | 0 refills | Status: DC

## 2018-12-11 MED ORDER — ADULT MULTIVITAMIN W/MINERALS CH
1.0000 | ORAL_TABLET | Freq: Every day | ORAL | Status: DC
  Administered 2018-12-12 – 2018-12-25 (×14): 1 via ORAL
  Filled 2018-12-11 (×14): qty 1

## 2018-12-11 MED ORDER — SPIRONOLACTONE 25 MG PO TABS
25.0000 mg | ORAL_TABLET | Freq: Every day | ORAL | Status: DC
  Administered 2018-12-12 – 2018-12-25 (×14): 25 mg via ORAL
  Filled 2018-12-11 (×14): qty 1

## 2018-12-11 MED ORDER — OXYCODONE HCL 5 MG PO TABS: 5.0000 mg | ORAL_TABLET | ORAL | Status: DC | PRN

## 2018-12-11 MED ORDER — PANTOPRAZOLE SODIUM 40 MG PO TBEC
40.0000 mg | DELAYED_RELEASE_TABLET | Freq: Every day | ORAL | Status: DC
  Administered 2018-12-12 – 2018-12-25 (×14): 40 mg via ORAL
  Filled 2018-12-11 (×14): qty 1

## 2018-12-11 MED ORDER — SPIRONOLACTONE 25 MG PO TABS: 25.0000 mg | ORAL_TABLET | Freq: Every day | ORAL | Status: DC

## 2018-12-11 MED ORDER — ADULT MULTIVITAMIN W/MINERALS CH: 1.0000 | ORAL_TABLET | Freq: Every day | ORAL | Status: DC

## 2018-12-11 MED ORDER — TRAMADOL HCL 50 MG PO TABS: 50.0000 mg | ORAL_TABLET | ORAL | Status: DC | PRN

## 2018-12-11 MED ORDER — CHLORHEXIDINE GLUCONATE 0.12 % MT SOLN
15.0000 mL | Freq: Two times a day (BID) | OROMUCOSAL | Status: DC
  Administered 2018-12-11 – 2018-12-22 (×20): 15 mL via OROMUCOSAL
  Filled 2018-12-11 (×27): qty 15

## 2018-12-11 MED ORDER — PRO-STAT SUGAR FREE PO LIQD
30.0000 mL | Freq: Every day | ORAL | Status: DC
  Administered 2018-12-12 – 2018-12-18 (×5): 30 mL via ORAL
  Filled 2018-12-11 (×11): qty 30

## 2018-12-11 MED ORDER — ASPIRIN EC 325 MG PO TBEC
325.0000 mg | DELAYED_RELEASE_TABLET | Freq: Every day | ORAL | Status: DC
  Administered 2018-12-13 – 2018-12-14 (×2): 325 mg via ORAL
  Filled 2018-12-11 (×4): qty 1

## 2018-12-11 MED ORDER — SACUBITRIL-VALSARTAN 24-26 MG PO TABS: 1.0000 | ORAL_TABLET | Freq: Two times a day (BID) | ORAL | Status: DC

## 2018-12-11 MED ORDER — SORBITOL 70 % SOLN
30.0000 mL | Freq: Every day | Status: DC | PRN
  Administered 2018-12-25: 30 mL via ORAL
  Filled 2018-12-11: qty 30

## 2018-12-11 MED ORDER — GUAIFENESIN ER 600 MG PO TB12
600.0000 mg | ORAL_TABLET | Freq: Two times a day (BID) | ORAL | Status: DC
  Administered 2018-12-11 – 2018-12-25 (×24): 600 mg via ORAL
  Filled 2018-12-11 (×27): qty 1

## 2018-12-11 NOTE — Progress Notes (Signed)
Inpatient Rehabilitation Admissions Coordinator  I have spoken with patient at bedside and friend/NOK, Eric by phone. Both prefer an inpt rehab admit rather than SNF and can arrange caregiver support after a CIR admit. I have contacted Lars Pinks, PA to clarify medical readminess. She will check with Dr. Orvan Seen and let me know. I can admit pt to CIR today.  Danne Baxter, RN, MSN Rehab Admissions Coordinator [phone] 12/11/2018 11:24 AM

## 2018-12-11 NOTE — Progress Notes (Signed)
Curtis Arn, MD  Physician  Physical Medicine and Rehabilitation  PMR Pre-admission  Signed  Date of Service:  12/11/2018 11:54 AM      Related encounter: ED to Hosp-Admission (Current) from 11/23/2018 in [place]         Show:Clear all _0 Manual_1 Template_2 Copied  Added by: _3 Curtis Gong, RN_4 Curtis Arn, MD  _5 Hover for details PMR Admission Coordinator Pre-Admission Assessment  Patient: Curtis [name] is an 68 y.o., male MRN: [id number] DOB: 1951/02/18 Height: _6  (175.3 cm) Weight: 85.8 kg  Insurance Information HMO:     PPO:      PCP:      IPA:      80/20:      OTHER:  PRIMARY: Medicare a and b      Policy#: [id number]      Subscriber: pt Benefits:  Phone #: passport one     Name: 12/10/2018 Eff. Date: a 12/02/2017 b 01/02/2018     Deduct: $1408      Out of Pocket Max: none      Life Max: none CIR: 100%      SNF: 20 full days Outpatient: 80%     Co-Pay: 20% Home Health: 100%      Co-Pay: none DME: 80%     Co-Pay: 20% Providers: pt choice  SECONDARY: none      Medicaid Application Date:       Case Manager:  Disability Application Date:       Case Worker:   The "Data Collection Information Summary" for patients in Inpatient Rehabilitation Facilities with attached "Privacy Act Kimberly Records" was provided and verbally reviewed with: Patient and Family  Emergency Contact Information         Contact Information    Name Relation Home Work Mobile   [name],ERIC Friend   [phone]   Curtis [name]   [phone]      Current Medical History  Patient Admitting Diagnosis:  Respiratory distress, debility, s/p CABG and MVR  History of Present Illness: 68 year old right-handed male with history of tobacco abuse on no prescription medications. Presented 11/24/2018 with increasing shortness of breath, tachycardic and tachypnea. Oxygen saturations 60% on room air.  There were reports of multiple syncopal episodes in route but patient never lost pulses. He did receive epinephrine 0.3 mg as well as Solu-Medrol. Patient placed on BiPAP, high-sensitivity troponin 63. EKG showed narrow complex tachycardia. BUN 16, creatinine 1.4, COVID negative. Chest x-ray showed mild cardiomegaly and moderate pulmonary edema. Echocardiogram with ejection fraction of 25%. Underwent cardiac catheterization per Dr. Daneen Schick showing severe multivessel CAD with 40% tubular left main. 60 to 70% proximal LAD tandem lesions. Severe 80% diffuse disease in the large first diagonal. Underwent CABG x3 as well as mitral valve replacement 11/30/2018 per Dr. Orvan Seen of [facility]. Postoperative cardiogenic shock with mediastinal bleeding and patient returned emergently to the OR and underwent removal of Impella left ventricular assistive device evacuation of left hemothorax 12/04/2018. Patient was extubated 12/05/2018. CT negative for pulmonary emboli or significant effusion or infiltrate. Hospital course pain management. Postoperative anemia 10.1 as well as leukocytosis 17,300 improved to 15,900and patient had been maintained on empiric vancomycin and cefepime. Urine culture and blood cultures no growth. Subcutaneous Lovenox for DVT prophylaxis. Patient is currently maintained also on aspirin 325 mg daily after CABG. Patient had initially been placed on milrinone transition to amiodarone 200 mg twice daily. Findings of mildly elevated hemoglobin A1c 5.8  with sliding scale insulinand low-dose Levemir added. Patient is tolerating a regular consistency diet. There have been intermittent bouts of confusion that is steadily improve postoperatively with therapies initiated and sternal precautions as directed.   Patient's medical record from [facility] has been reviewed by the rehabilitation admission coordinator and physician.  Past Medical History      Past Medical History:   Diagnosis Date  . Coronary artery disease   . Elevated troponin 11/25/2018  . Tobacco use     Family History   family history includes Heart attack in his father and mother.  Prior Rehab/Hospitalizations Has the patient had prior rehab or hospitalizations prior to admission? Yes  Has the patient had major surgery during 100 days prior to admission? Yes             Current Medications  Current Facility-Administered Medications:  .  amiodarone (PACERONE) tablet 200 mg, 200 mg, Oral, BID, Orvan Seen, Glenice Bow, MD, 200 mg at 12/11/18 0927 .  aspirin EC tablet 325 mg, 325 mg, Oral, Daily, 325 mg at 12/11/18 9163 **OR** aspirin chewable tablet 324 mg, 324 mg, Per Tube, Daily, Wonda Olds, MD, 324 mg at 12/05/18 0825 .  bisacodyl (DULCOLAX) EC tablet 10 mg, 10 mg, Oral, Daily, 10 mg at 12/10/18 1000 **OR** bisacodyl (DULCOLAX) suppository 10 mg, 10 mg, Rectal, Daily, Atkins, Broadus Z, MD .  ceFEPIme (MAXIPIME) 2 g in sodium chloride 0.9 % 100 mL IVPB, 2 g, Intravenous, Q8H, Bensimhon, Shaune Pascal, MD, Last Rate: 200 mL/hr at 12/11/18 1149, 2 g at 12/11/18 1149 .  chlorhexidine (PERIDEX) 0.12 % solution 15 mL, 15 mL, Mouth Rinse, BID, Atkins, Glenice Bow, MD, 15 mL at 12/11/18 0929 .  Chlorhexidine Gluconate Cloth 2 % PADS 6 each, 6 each, Topical, Daily, Orvan Seen, Glenice Bow, MD, 6 each at 12/11/18 1150 .  docusate sodium (COLACE) capsule 200 mg, 200 mg, Oral, Daily, Atkins, Broadus Z, MD, 200 mg at 12/10/18 1000 .  enoxaparin (LOVENOX) injection 40 mg, 40 mg, Subcutaneous, Q24H, Atkins, Glenice Bow, MD, 40 mg at 12/11/18 0929 .  feeding supplement (PRO-STAT SUGAR FREE 64) liquid 30 mL, 30 mL, Oral, Daily, Orvan Seen, Glenice Bow, MD, 30 mL at 12/11/18 0928 .  guaiFENesin (MUCINEX) 12 hr tablet 600 mg, 600 mg, Oral, BID, Atkins, Glenice Bow, MD, 600 mg at 12/11/18 8466 .  hydrALAZINE (APRESOLINE) tablet 10 mg, 10 mg, Oral, Q8H PRN, Atkins, Glenice Bow, MD .  levalbuterol (XOPENEX) nebulizer solution  0.63 mg, 0.63 mg, Nebulization, BID, Orvan Seen, Glenice Bow, MD, 0.63 mg at 12/11/18 0819 .  metoprolol tartrate (LOPRESSOR) injection 2.5-5 mg, 2.5-5 mg, Intravenous, Q2H PRN, Atkins, Broadus Z, MD .  morphine 2 MG/ML injection 1-4 mg, 1-4 mg, Intravenous, Q1H PRN, Wonda Olds, MD, 2 mg at 12/06/18 2355 .  multivitamin with minerals tablet 1 tablet, 1 tablet, Oral, Daily, Wonda Olds, MD, 1 tablet at 12/11/18 0927 .  ondansetron (ZOFRAN) injection 4 mg, 4 mg, Intravenous, Q6H PRN, Atkins, Broadus Z, MD .  oxyCODONE (Oxy IR/ROXICODONE) immediate release tablet 5-10 mg, 5-10 mg, Oral, Q3H PRN, Wonda Olds, MD, 5 mg at 12/03/18 2134 .  pantoprazole (PROTONIX) EC tablet 40 mg, 40 mg, Oral, Daily, Orvan Seen, Glenice Bow, MD, 40 mg at 12/11/18 0929 .  rosuvastatin (CRESTOR) tablet 40 mg, 40 mg, Oral, q1800, Bensimhon, Shaune Pascal, MD .  sacubitril-valsartan (ENTRESTO) 24-26 mg per tablet, 1 tablet, Oral, BID, Bensimhon, Shaune Pascal, MD, 1 tablet at 12/11/18 [phone] .  sodium chloride flush (NS) 0.9 % injection 10-40 mL, 10-40 mL, Intracatheter, Q12H, Atkins, Glenice Bow, MD, 10 mL at 12/11/18 0934 .  sodium chloride flush (NS) 0.9 % injection 10-40 mL, 10-40 mL, Intracatheter, PRN, Atkins, Broadus Z, MD .  sodium chloride flush (NS) 0.9 % injection 3 mL, 3 mL, Intravenous, Q12H, Atkins, Glenice Bow, MD, 3 mL at 12/11/18 0935 .  spironolactone (ALDACTONE) tablet 25 mg, 25 mg, Oral, Daily, Wonda Olds, MD, 25 mg at 12/11/18 0927 .  torsemide (DEMADEX) tablet 20 mg, 20 mg, Oral, Daily, Bensimhon, Shaune Pascal, MD, 20 mg at 12/11/18 0929 .  traMADol (ULTRAM) tablet 50-100 mg, 50-100 mg, Oral, Q4H PRN, Orvan Seen, Glenice Bow, MD .  [COMPLETED] vancomycin (VANCOCIN) 2,000 mg in sodium chloride 0.9 % 500 mL IVPB, 2,000 mg, Intravenous, Once, Stopped at 12/07/18 1550 **FOLLOWED BY** vancomycin (VANCOCIN) 1,750 mg in sodium chloride 0.9 % 500 mL IVPB, 1,750 mg, Intravenous, Q24H, Bensimhon, Shaune Pascal, MD, Last Rate: 250  mL/hr at 12/10/18 1214, 1,750 mg at 12/10/18 1214  Patients Current Diet:     Diet Order                  Diet heart healthy/carb modified Room service appropriate? Yes; Fluid consistency: Thin  Diet effective now               Precautions / Restrictions Precautions Precautions: Fall, Sternal Precaution Booklet Issued: No Precaution Comments: Initiated precaution education Restrictions Weight Bearing Restrictions: No Other Position/Activity Restrictions: sternal precautions   Has the patient had 2 or more falls or a fall with injury in the past year? No  Prior Activity Level Community (5-7x/wk): Independent and active pta; retired. drives  Prior Functional Level Self Care: Did the patient need help bathing, dressing, using the toilet or eating? Independent  Indoor Mobility: Did the patient need assistance with walking from room to room (with or without device)? Independent  Stairs: Did the patient need assistance with internal or external stairs (with or without device)? Independent  Functional Cognition: Did the patient need help planning regular tasks such as shopping or remembering to take medications? Independent  Home Assistive Devices / Equipment Home Assistive Devices/Equipment: Eyeglasses Home Equipment: None  Prior Device Use: Indicate devices/aids used by the patient prior to current illness, exacerbation or injury? None of the above  Current Functional Level Cognition  Overall Cognitive Status: Impaired/Different from baseline Current Attention Level: Selective Orientation Level: Oriented X4 Following Commands: Follows multi-step commands inconsistently Safety/Judgement: Decreased awareness of deficits, Decreased awareness of safety General Comments: More alert this session    Extremity Assessment (includes Sensation/Coordination)  Upper Extremity Assessment: Generalized weakness  Lower Extremity Assessment: Generalized weakness     ADLs  Overall ADL's : Needs assistance/impaired Eating/Feeding: Set up, Sitting Grooming: Wash/dry face, Set up, Sitting Upper Body Bathing: Moderate assistance, Sitting Lower Body Bathing: Maximal assistance, Sitting/lateral leans Upper Body Dressing : Moderate assistance, Sitting Lower Body Dressing: Moderate assistance, +2 for physical assistance Toilet Transfer: Minimal assistance, +2 for physical assistance, BSC Toileting- Clothing Manipulation and Hygiene: Maximal assistance, Sit to/from stand Functional mobility during ADLs: Minimal assistance, +2 for safety/equipment General ADL Comments: initiated sternal precautions with application to ADL tasks    Mobility  Overal bed mobility: Needs Assistance Bed Mobility: Sit to Supine, Sidelying to Sit, Rolling Rolling: Modified independent (Device/Increase time) Sidelying to sit: Mod assist Supine to sit: Mod assist Sit to supine: Supervision General bed mobility comments: ModA to assist trunk elevation while maintaining  sternal precautions, pt attempting to power into long sitting, cued for log roll    Transfers  Overall transfer level: Needs assistance Equipment used: 1 person hand held assist Transfers: Sit to/from Stand Sit to Stand: Mod assist General transfer comment: Cued for hand placement to maintain sternal precautions while standing, pt still reaching for UE support, reliant on HHA and modA to stand    Ambulation / Gait / Stairs / Wheelchair Mobility  Ambulation/Gait Ambulation/Gait assistance: Mod assist Gait Distance (Feet): 32 Feet Assistive device: 1 person hand held assist Gait Pattern/deviations: Step-to pattern, Staggering left, Staggering right, Wide base of support General Gait Details: Slow, very unsteady (almost ataxic-like) gait with RUE HHA and frequent modA to maintain balance; pt reaching for LUE support on furniture/walls; 1x standing rest break at sink and 1x seated rest break; pt with  difficulty multitasking while walking; poor insight into balance deficits Gait velocity: Decreased Gait velocity interpretation: <1.31 ft/sec, indicative of household ambulator    Posture / Balance Balance Overall balance assessment: Needs assistance Sitting balance-Leahy Scale: Fair Standing balance-Leahy Scale: Poor Standing balance comment: Reliant on UE support and external assist; unable to tolerate challenge    Special needs/care consideration BiPAP/CPAP n/a CPM  N/a Continuous Drip IV  N/a PICC RUE Dialysis n/a Life Vest  N/a Oxygen room air Special Bed  N/a Trach Size  N/a Wound Vac n/a Skin sternal surgical site with staples; chest tube and temporary pacer sites, left leg surgical incision, abrasion to buttocks, posterior and upper; ecchymosis to buttocks bilaterally, skin tear to buttocks Bowel mgmt: incontinent LBM 10/8 Bladder mgmt: external catheter Diabetic mgmt: n/a Behavioral consideration cognition not at baseline Chemo/radiation n/a Designated visitor is Randall Hiss   Previous Home Environment  Living Arrangements: (Patient and his MOm live together for many years)  Lives With: Other (Comment)(Mom and patient live together) Available Help at Discharge: (Eric/firend an dpat to arrange caregiver support as needed s) Type of Home: House Home Layout: 1/2 bath on main level, Bed/bath upstairs Alternate Level Stairs-Rails: Right Alternate Level Stairs-Number of Steps: Flight Home Access: Stairs to enter Entrance Stairs-Rails: Right Entrance Stairs-Number of Steps: 2-3 Bathroom Shower/Tub: Chiropodist: Standard Bathroom Accessibility: Yes How Accessible: Accessible via walker Home Care Services: No  Discharge Living Setting Plans for Discharge Living Setting: Patient's home(Patietn and his Mom live together) Type of Home at Discharge: House Discharge Home Layout: 1/2 bath on main level, Bed/bath upstairs Discharge Home Access: Stairs to  enter Entrance Stairs-Rails: Right Entrance Stairs-Number of Steps: 2 to 3 Discharge Bathroom Shower/Tub: Tub/shower unit Discharge Bathroom Toilet: Standard Discharge Bathroom Accessibility: Yes How Accessible: Accessible via walker Does the patient have any problems obtaining your medications?: No  Social/Family/Support Systems Patient Roles: Caregiver(28 year old Mom with mild dementia) Contact Information: Evette Cristal, friend and nieghbor. 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 [employer] Anticipated Caregiver: neighbors and hired caregivers as recommended Anticipated Caregiver's Contact Information: Randall Hiss [phone] Ability/Limitations of Caregiver: Randall Hiss works; he will arrange caregivers as recommended  by [employer] Caregiver Availability: Other (Comment) Discharge Plan Discussed with Primary Caregiver: Yes Is Caregiver In Agreement with Plan?: Yes Does Caregiver/Family have Issues with Lodging/Transportation while Pt is in Rehab?: No Mom with mild dementia; Randall Hiss has caregivers providing her intermittent supervision since patient admitted. Randall Hiss is aware that I recommend additional caregiver support for patient for a few weeks after d/c from CIR. They chose CIR rather than SNF for they felt he can reach level to d/c home with caregiver support hired  and by neighbors. Eric aware that CIR SW would not be arranging that caregiver support. It will be Randall Hiss.  Goals/Additional Needs Patient/Family Goal for Rehab: Mod I to supervision with PT, OT, and SLP Expected length of stay: ELOS 10 to 14c days; patient pushing for shorter LOS Special Service Needs: COgnitively patietn not at baseline Additional Information: STernal precautions Pt/Family Agrees to Admission and willing to participate: Yes Program Orientation Provided & Reviewed with Pt/Caregiver Including Roles  & Responsibilities: Yes  Barriers to Discharge: Other (comments)(sternal precautions and need for caregiver support to be arr)  Decrease burden of Care  through IP rehab admission: n/a  Possible need for SNF placement upon discharge: Randall Hiss and patient chose CIR rather than SNF for intensive rehab and Rehab MD oversight. Plan to d/c directly home with caregiver support to be arranged by Randall Hiss.  Patient Condition: I have reviewed medical records from [facility] , spoken with  patient and family member, Randall Hiss is [facility]. I met with patient at the bedside for inpatient rehabilitation assessment.  Patient will benefit from ongoing PT, OT and SLP, can actively participate in 3 hours of therapy a day 5 days of the week, and can make measurable gains during the admission.  Patient will also benefit from the coordinated team approach during an Inpatient Acute Rehabilitation admission.  The patient will receive intensive therapy as well as Rehabilitation physician, nursing, social worker, and care management interventions.  Due to bladder management, bowel management, safety, skin/wound care, disease management, medication administration, pain management and patient education the patient requires 24 hour a day rehabilitation nursing.  The patient is currently mod assist with mobility and basic ADLs.  Discharge setting and therapy post discharge at home with home health is anticipated.  Patient has agreed to participate in the Acute Inpatient Rehabilitation Program and will admit today.  Preadmission Screen Completed By:  Cleatrice Burke, 12/11/2018 11:54 AM ______________________________________________________________________   Discussed status with Dr. Posey Pronto  on  12/11/2018 at  41 and received approval for admission today.  Admission Coordinator:  Cleatrice Burke, RN, time  1210 Date  12/11/2018   Assessment/Plan: Diagnosis: Debility 1. Does the need for close, 24 hr/day Medical supervision in concert with the patient's rehab needs make it unreasonable for this patient to be served in a less intensive setting? Yes  2. Co-Morbidities  requiring supervision/potential complications: tobacco abuse, tachypnea, leukocytosis, acute blood loss anemia 3. Due to safety, skin/wound care, disease management, pain management and patient education, does the patient require 24 hr/day rehab nursing? Yes 4. Does the patient require coordinated care of a physician, rehab nurse, PT, OT, and SLP to address physical and functional deficits in the context of the above medical diagnosis(es)? Yes Addressing deficits in the following areas: balance, endurance, locomotion, strength, transferring, bathing, dressing, toileting and psychosocial support 5. Can the patient actively participate in an intensive therapy program of at least 3 hrs of therapy 5 days a week? Yes 6. The potential for patient to make measurable gains while on inpatient rehab is excellent 7. Anticipated functional outcomes upon discharge from inpatient rehab: modified independent PT, modified independent OT, n/a SLP 8. Estimated rehab length of stay to reach the above functional goals is: 12-16 days. 9. Anticipated discharge destination: Home 10. Overall Rehab/Functional Prognosis: excellent and fair   MD Signature: Delice Lesch, MD, ABPMR        Revision History

## 2018-12-11 NOTE — Plan of Care (Signed)
  Problem: Consults Goal: RH GENERAL PATIENT EDUCATION Description: See Patient Education module for education specifics. Outcome: Progressing Goal: Skin Care Protocol Initiated - if Braden Score 18 or less Description: If consults are not indicated, leave blank or document N/A Outcome: Progressing   Problem: RH BLADDER ELIMINATION Goal: RH STG MANAGE BLADDER WITH ASSISTANCE Description: STG Manage Bladder With min Assistance Outcome: Progressing   Problem: RH SKIN INTEGRITY Goal: RH STG SKIN FREE OF INFECTION/BREAKDOWN Description: No skin breakdown or wound infection while in rehab with min assist Outcome: Progressing Goal: RH STG MAINTAIN SKIN INTEGRITY WITH ASSISTANCE Description: STG Maintain Skin Integrity With min Assistance. Outcome: Progressing Goal: RH STG ABLE TO PERFORM INCISION/WOUND CARE W/ASSISTANCE Description: STG Able To Perform Incision/Wound Care With min  assistance. Outcome: Progressing   Problem: RH SAFETY Goal: RH STG ADHERE TO SAFETY PRECAUTIONS W/ASSISTANCE/DEVICE Description: STG Adhere to Safety Precautions With min Assistance/Device. Outcome: Progressing Goal: RH STG DECREASED RISK OF FALL WITH ASSISTANCE Description: STG Decreased Risk of Fall With min assistance. Outcome: Progressing   Problem: RH PAIN MANAGEMENT Goal: RH STG PAIN MANAGED AT OR BELOW PT'S PAIN GOAL Description: Pain <3 Outcome: Progressing   Problem: RH KNOWLEDGE DEFICIT GENERAL Goal: RH STG INCREASE KNOWLEDGE OF SELF CARE AFTER HOSPITALIZATION Description: Information on self care after hospitalization will increase/improve with min assist Outcome: Progressing

## 2018-12-11 NOTE — Progress Notes (Signed)
Received report from [place], Maudry Mayhew. Pt noted sitting up in chair in room. Safety and fall precautions reviewed with patient. Pt verbalizes understanding. Pt oriented to nurse, room and surroundings. No distress noted. Denies pain. Assisted to bed, with call light within reach.

## 2018-12-11 NOTE — Progress Notes (Signed)
Pt for d/c to CIR. Discussed sternal precautions, IS, low sodium, smoking cessation and CRPII. He was not able to focus as well as preferred but I encouraged him to read materials, especially closer to d/c. Will refer to [place]. Not sure if he could do virtual. [phone] Yves Dill CES, ACSM 3:01 PM 12/11/2018

## 2018-12-11 NOTE — Progress Notes (Signed)
Patient ID: Curtis [name], male   DOB: 08/23/1950, 68 y.o.   MRN: [id number]    Advanced Heart Failure Rounding Note   Subjective:    Impella removed + evacuation of hemothorax 10/2.  Extubated and Swan removed 10/3.   Feels ok. Denies SOB. No CP. Weight down another 3 pounds. Creatinine stable. Maintaining nSR  Objective:   Weight Range:  Vital Signs:   Temp:  [96.6 F (35.9 C)-98.3 F (36.8 C)] 97.5 F (36.4 C) (10/09 0735) Pulse Rate:  [57-94] 88 (10/09 0819) Resp:  [13-26] 22 (10/09 0819) BP: (96-149)/(45-100) 123/53 (10/09 0819) SpO2:  [94 %-100 %] 97 % (10/09 0819) Weight:  [85.8 kg] 85.8 kg (10/09 0509) Last BM Date: 12/10/18  Weight change: Filed Weights   12/09/18 0413 12/10/18 0500 12/11/18 0509  Weight: 91.7 kg 87.4 kg 85.8 kg    Intake/Output:   Intake/Output Summary (Last 24 hours) at 12/11/2018 0855 Last data filed at 12/11/2018 0500 Gross per 24 hour  Intake 627.03 ml  Output 3430 ml  Net -2802.97 ml     Physical Exam: General:  Sitting in chair. No resp difficulty HEENT: normal Neck: supple. no JVD. Carotids 2+ bilat; no bruits. No lymphadenopathy or thryomegaly appreciated. Cor: sternal wound ok PMI nondisplaced. Regular rate & rhythm. No rubs, gallops or murmurs. Lungs: clear decreased in bases Abdomen: soft, nontender, nondistended. No hepatosplenomegaly. No bruits or masses. Good bowel sounds. Extremities: no cyanosis, clubbing, rash, edema Neuro: alert & orientedx3, cranial nerves grossly intact. moves all 4 extremities w/o difficulty. Affect pleasant   Telemetry: NSR 80-90s Personally reviewed  Labs: Basic Metabolic Panel: Recent Labs  Lab 12/06/18 0428  12/07/18 0428  12/07/18 0943 12/08/18 0343 12/08/18 0750 12/09/18 0424 12/10/18 0530 12/10/18 0750 12/11/18 0330  NA 133*   < > 134*   < > 133*  --  133* 132*  --  136 135  K 3.2*   < > 3.4*   < > 3.6  --  3.1* 3.1*  --  3.3* 3.9  CL 97*  --  95*  --   --   --  99 102  --  104  102  CO2 26  --  27  --   --   --  24 21*  --  22 22  GLUCOSE 140*  --  162*  --   --   --  131* 96  --  94 118*  BUN 17  --  18  --   --   --  20 18  --  18 24*  CREATININE 1.16  --  1.20  --   --   --  1.16 1.13  --  1.01 1.24  CALCIUM 7.6*  --  7.6*  --   --   --  7.5* 7.4*  --  7.4* 7.9*  MG 1.9  --  1.9  --   --  2.2  --  2.1 2.2  --   --    < > = values in this interval not displayed.    Liver Function Tests: Recent Labs  Lab 12/06/18 0428 12/07/18 0428  AST 19 18  ALT 21 19  ALKPHOS 58 63  BILITOT 3.4* 2.6*  PROT 5.0* 5.0*  ALBUMIN 2.4* 2.3*   No results for input(s): LIPASE, AMYLASE in the last 168 hours. No results for input(s): AMMONIA in the last 168 hours.  CBC: Recent Labs  Lab 12/04/18 1642  12/07/18 0428  12/07/18 [id number] 12/08/18 0750  12/09/18 0424 12/10/18 0750 12/11/18 0330  WBC 24.5*   < > 26.9*  --   --  17.3* 12.8* 12.3* 15.9*  NEUTROABS 18.9*  --   --   --   --   --  9.9*  --   --   HGB 9.3*   < > 9.8*   < > 9.9* 9.0* 9.2* 10.1* 10.8*  HCT 26.6*   < > 29.5*   < > 29.0* 28.7* 29.4* 32.3* 34.4*  MCV 89.3   < > 93.4  --   --  97.0 96.4 96.4 96.1  PLT 121*   < > 182  --   --  229 262 329 362   < > = values in this interval not displayed.    Cardiac Enzymes: No results for input(s): CKTOTAL, CKMB, CKMBINDEX, TROPONINI in the last 168 hours.  BNP: BNP (last 3 results) Recent Labs    11/23/18 2328  BNP 507.9*    ProBNP (last 3 results) No results for input(s): PROBNP in the last 8760 hours.    Other results:  Imaging: [place] Ekg Site Rite  Result Date: 12/10/2018 If Site Rite image not attached, placement could not be confirmed due to current cardiac rhythm.    Medications:     Scheduled Medications: . amiodarone  200 mg Oral BID  . aspirin EC  325 mg Oral Daily   Or  . aspirin  324 mg Per Tube Daily  . bisacodyl  10 mg Oral Daily   Or  . bisacodyl  10 mg Rectal Daily  . chlorhexidine  15 mL Mouth Rinse BID  . Chlorhexidine  Gluconate Cloth  6 each Topical Daily  . docusate sodium  200 mg Oral Daily  . enoxaparin (LOVENOX) injection  40 mg Subcutaneous Q24H  . feeding supplement (PRO-STAT SUGAR FREE 64)  30 mL Oral Daily  . guaiFENesin  600 mg Oral BID  . levalbuterol  0.63 mg Nebulization BID  . multivitamin with minerals  1 tablet Oral Daily  . pantoprazole  40 mg Oral Daily  . potassium chloride  30 mEq Oral Once  . sacubitril-valsartan  1 tablet Oral BID  . sodium chloride flush  10-40 mL Intracatheter Q12H  . sodium chloride flush  3 mL Intravenous Q12H  . spironolactone  25 mg Oral Daily  . torsemide  40 mg Oral Daily    Infusions: . ceFEPime (MAXIPIME) IV 2 g (12/11/18 0342)  . vancomycin 1,750 mg (12/10/18 1214)    PRN Medications: hydrALAZINE, metoprolol tartrate, morphine injection, ondansetron (ZOFRAN) IV, oxyCODONE, sodium chloride flush, traMADol   Assessment/Plan:   1. Post-cardiotomy cardiogenic shock in setting if severe iCM and post-operative bleeding/hemorrhagic shock - pre-op echo EF 25-30% - s/p CABG & bioprosthetic MVR with Impella placement on 9/28 c/b severe coagulopathy/bleeding requiring OR takeback  - On 10/2 Impella removal and evacuation of left hemothorax - Volume status low. Decrease torsemide 20 mg daily. -> may be able to stop soon - Continue spiro 25 mg daily - Continue Entresto   2. Acute hypoxemic respiratory failure - Extubated 9/30 - Evacuation hemothorax in OR 10/2 and re-intubated.  - Extubated 10/3.   - CT negative for PE or significant effusion/infiltrate - On room air.  - CXR pending  3. Severe MR - s/p bioprosthetic MVR. Stable   4. Acute blood loss anemia - transfuse to keep hgb >= 8.0 - hgb 10/8 today  5. CAD with NSTEMI - s/p CABG 9/28 - on ASA/statin -  no s/s ischemia - b-blocker and Plavix when ready  6. Thrombocytopenia - resolved.  7. PAF - Maintaining NSR. On po amio  8. DVT prophylaxis - Lovenox.   9. Leukocytosis -  WBC back up  - but afebrile. - today is day 5 abx. Will stop after today  PT recommending CIR. They have seen him. Awaiting decision CIR vs SNF  Length of Stay: 17   Glori Bickers MD 12/11/2018, 8:55 AM  Advanced Heart Failure Team Pager [phone] (M-F; [place])  Please contact [facility] for night-coverage after hours (4p -7a ) and weekends on [email or web address]

## 2018-12-11 NOTE — Progress Notes (Signed)
Pt refusing CPAP at this time. Advised pt to notify for RT if he changes his mind. RT will continue to monitor.

## 2018-12-11 NOTE — Progress Notes (Signed)
      [address]       [place] [address]             [phone]     EPW's removed per routine technique   John Giovanni, PA-C

## 2018-12-11 NOTE — Progress Notes (Signed)
Discharged to  In-pt rehab by wheelchair, report given to RN. Belongings with pt.

## 2018-12-11 NOTE — PMR Pre-admission (Signed)
PMR Admission Coordinator Pre-Admission Assessment  Patient: Curtis [name] is an 68 y.o., male MRN: [id number] DOB: [birth year] Height: _0  (175.3 cm) Weight: 85.8 kg  Insurance Information HMO:     PPO:      PCP:      IPA:      80/20:      OTHER:  PRIMARY: Medicare a and b      Policy#: [id number]      Subscriber: pt Benefits:  Phone #: passport one     Name: 12/10/2018 Eff. Date: a 12/02/2017 b 01/02/2018     Deduct: $1408      Out of Pocket Max: none      Life Max: none CIR: 100%      SNF: 20 full days Outpatient: 80%     Co-Pay: 20% Home Health: 100%      Co-Pay: none DME: 80%     Co-Pay: 20% Providers: pt choice  SECONDARY: none      Medicaid Application Date:       Case Manager:  Disability Application Date:       Case Worker:   The "Data Collection Information Summary" for patients in Inpatient Rehabilitation Facilities with attached "Privacy Act [place] Records" was provided and verbally reviewed with: Patient and Family  Emergency Contact Information Contact Information    Name Relation Home Work Mobile   ANTERHAUS,ERIC Friend   [phone]   [name]   [phone]      Current Medical History  Patient Admitting Diagnosis:  Respiratory distress, debility, s/p CABG and MVR  History of Present Illness: 68 year old right-handed male with history of tobacco abuse on no prescription medications.  Presented 11/24/2018 with increasing shortness of breath, tachycardic and tachypnea.  Oxygen saturations 60% on room air.  There were reports of multiple syncopal episodes in route but patient never lost pulses.  He did receive epinephrine 0.3 mg as well as Solu-Medrol.  Patient placed on BiPAP, high-sensitivity troponin 63.  EKG showed narrow complex tachycardia.  BUN 16, creatinine 1.4, COVID negative.  Chest x-ray showed mild cardiomegaly and moderate pulmonary edema.  Echocardiogram with ejection fraction of 25%.  Underwent cardiac catheterization per Dr.  Daneen Schick showing severe multivessel CAD with 40% tubular left main.  60 to 70% proximal LAD tandem lesions.  Severe 80% diffuse disease in the large first diagonal.  Underwent CABG x3 as well as mitral valve replacement 11/30/2018 per Dr. Orvan Seen of [facility].  Postoperative cardiogenic shock with mediastinal bleeding and patient returned emergently to the OR and underwent removal of Impella left ventricular assistive device evacuation of left hemothorax 12/04/2018.  Patient was extubated 12/05/2018.  CT negative for pulmonary emboli or significant effusion or infiltrate.  Hospital course pain management.  Postoperative anemia 10.1 as well as leukocytosis 17,300 improved to 15,900 and patient had been maintained on empiric vancomycin and cefepime.  Urine culture and blood cultures no growth.  Subcutaneous Lovenox for DVT prophylaxis.  Patient is currently maintained also on aspirin 325 mg daily after CABG.  Patient had initially been placed on milrinone transition to amiodarone 200 mg twice daily.  Findings of mildly elevated hemoglobin A1c 5.8 with sliding scale insulin and low-dose Levemir added.  Patient is tolerating a regular consistency diet.  There have been intermittent bouts of confusion that is steadily improve postoperatively with therapies initiated and sternal precautions as directed.    Patient's medical record from [facility] has been reviewed by the rehabilitation admission coordinator and physician.  Past  Medical History  Past Medical History:  Diagnosis Date  . Coronary artery disease   . Elevated troponin 11/25/2018  . Tobacco use     Family History   family history includes Heart attack in his father and mother.  Prior Rehab/Hospitalizations Has the patient had prior rehab or hospitalizations prior to admission? Yes  Has the patient had major surgery during 100 days prior to admission? Yes   Current Medications  Current Facility-Administered Medications:  .  amiodarone  (PACERONE) tablet 200 mg, 200 mg, Oral, BID, Orvan Seen, Glenice Bow, MD, 200 mg at 12/11/18 0927 .  aspirin EC tablet 325 mg, 325 mg, Oral, Daily, 325 mg at 12/11/18 1941 **OR** aspirin chewable tablet 324 mg, 324 mg, Per Tube, Daily, Wonda Olds, MD, 324 mg at 12/05/18 0825 .  bisacodyl (DULCOLAX) EC tablet 10 mg, 10 mg, Oral, Daily, 10 mg at 12/10/18 1000 **OR** bisacodyl (DULCOLAX) suppository 10 mg, 10 mg, Rectal, Daily, Atkins, Broadus Z, MD .  ceFEPIme (MAXIPIME) 2 g in sodium chloride 0.9 % 100 mL IVPB, 2 g, Intravenous, Q8H, Bensimhon, Shaune Pascal, MD, Last Rate: 200 mL/hr at 12/11/18 1149, 2 g at 12/11/18 1149 .  chlorhexidine (PERIDEX) 0.12 % solution 15 mL, 15 mL, Mouth Rinse, BID, Atkins, Glenice Bow, MD, 15 mL at 12/11/18 0929 .  Chlorhexidine Gluconate Cloth 2 % PADS 6 each, 6 each, Topical, Daily, Orvan Seen, Glenice Bow, MD, 6 each at 12/11/18 1150 .  docusate sodium (COLACE) capsule 200 mg, 200 mg, Oral, Daily, Atkins, Broadus Z, MD, 200 mg at 12/10/18 1000 .  enoxaparin (LOVENOX) injection 40 mg, 40 mg, Subcutaneous, Q24H, Atkins, Glenice Bow, MD, 40 mg at 12/11/18 0929 .  feeding supplement (PRO-STAT SUGAR FREE 64) liquid 30 mL, 30 mL, Oral, Daily, Orvan Seen, Glenice Bow, MD, 30 mL at 12/11/18 0928 .  guaiFENesin (MUCINEX) 12 hr tablet 600 mg, 600 mg, Oral, BID, Atkins, Glenice Bow, MD, 600 mg at 12/11/18 7408 .  hydrALAZINE (APRESOLINE) tablet 10 mg, 10 mg, Oral, Q8H PRN, Atkins, Glenice Bow, MD .  levalbuterol (XOPENEX) nebulizer solution 0.63 mg, 0.63 mg, Nebulization, BID, Orvan Seen, Glenice Bow, MD, 0.63 mg at 12/11/18 0819 .  metoprolol tartrate (LOPRESSOR) injection 2.5-5 mg, 2.5-5 mg, Intravenous, Q2H PRN, Atkins, Broadus Z, MD .  morphine 2 MG/ML injection 1-4 mg, 1-4 mg, Intravenous, Q1H PRN, Wonda Olds, MD, 2 mg at 12/06/18 2355 .  multivitamin with minerals tablet 1 tablet, 1 tablet, Oral, Daily, Wonda Olds, MD, 1 tablet at 12/11/18 0927 .  ondansetron (ZOFRAN) injection 4 mg, 4 mg,  Intravenous, Q6H PRN, Atkins, Broadus Z, MD .  oxyCODONE (Oxy IR/ROXICODONE) immediate release tablet 5-10 mg, 5-10 mg, Oral, Q3H PRN, Wonda Olds, MD, 5 mg at 12/03/18 2134 .  pantoprazole (PROTONIX) EC tablet 40 mg, 40 mg, Oral, Daily, Orvan Seen, Glenice Bow, MD, 40 mg at 12/11/18 0929 .  rosuvastatin (CRESTOR) tablet 40 mg, 40 mg, Oral, q1800, Bensimhon, Shaune Pascal, MD .  sacubitril-valsartan (ENTRESTO) 24-26 mg per tablet, 1 tablet, Oral, BID, Bensimhon, Shaune Pascal, MD, 1 tablet at 12/11/18 [phone] .  sodium chloride flush (NS) 0.9 % injection 10-40 mL, 10-40 mL, Intracatheter, Q12H, Atkins, Glenice Bow, MD, 10 mL at 12/11/18 0934 .  sodium chloride flush (NS) 0.9 % injection 10-40 mL, 10-40 mL, Intracatheter, PRN, Atkins, Broadus Z, MD .  sodium chloride flush (NS) 0.9 % injection 3 mL, 3 mL, Intravenous, Q12H, Atkins, Glenice Bow, MD, 3 mL at 12/11/18 0935 .  spironolactone (  ALDACTONE) tablet 25 mg, 25 mg, Oral, Daily, Orvan Seen, Glenice Bow, MD, 25 mg at 12/11/18 0927 .  torsemide (DEMADEX) tablet 20 mg, 20 mg, Oral, Daily, Bensimhon, Shaune Pascal, MD, 20 mg at 12/11/18 0929 .  traMADol (ULTRAM) tablet 50-100 mg, 50-100 mg, Oral, Q4H PRN, Orvan Seen, Glenice Bow, MD .  [COMPLETED] vancomycin (VANCOCIN) 2,000 mg in sodium chloride 0.9 % 500 mL IVPB, 2,000 mg, Intravenous, Once, Stopped at 12/07/18 1550 **FOLLOWED BY** vancomycin (VANCOCIN) 1,750 mg in sodium chloride 0.9 % 500 mL IVPB, 1,750 mg, Intravenous, Q24H, Bensimhon, Shaune Pascal, MD, Last Rate: 250 mL/hr at 12/10/18 1214, 1,750 mg at 12/10/18 1214  Patients Current Diet:  Diet Order            Diet heart healthy/carb modified Room service appropriate? Yes; Fluid consistency: Thin  Diet effective now              Precautions / Restrictions Precautions Precautions: Fall, Sternal Precaution Booklet Issued: No Precaution Comments: Initiated precaution education Restrictions Weight Bearing Restrictions: No Other Position/Activity Restrictions: sternal  precautions   Has the patient had 2 or more falls or a fall with injury in the past year? No  Prior Activity Level Community (5-7x/wk): Independent and active pta; retired. drives  Prior Functional Level Self Care: Did the patient need help bathing, dressing, using the toilet or eating? Independent  Indoor Mobility: Did the patient need assistance with walking from room to room (with or without device)? Independent  Stairs: Did the patient need assistance with internal or external stairs (with or without device)? Independent  Functional Cognition: Did the patient need help planning regular tasks such as shopping or remembering to take medications? Independent  Home Assistive Devices / Equipment Home Assistive Devices/Equipment: Eyeglasses Home Equipment: None  Prior Device Use: Indicate devices/aids used by the patient prior to current illness, exacerbation or injury? None of the above  Current Functional Level Cognition  Overall Cognitive Status: Impaired/Different from baseline Current Attention Level: Selective Orientation Level: Oriented X4 Following Commands: Follows multi-step commands inconsistently Safety/Judgement: Decreased awareness of deficits, Decreased awareness of safety General Comments: More alert this session    Extremity Assessment (includes Sensation/Coordination)  Upper Extremity Assessment: Generalized weakness  Lower Extremity Assessment: Generalized weakness    ADLs  Overall ADL's : Needs assistance/impaired Eating/Feeding: Set up, Sitting Grooming: Wash/dry face, Set up, Sitting Upper Body Bathing: Moderate assistance, Sitting Lower Body Bathing: Maximal assistance, Sitting/lateral leans Upper Body Dressing : Moderate assistance, Sitting Lower Body Dressing: Moderate assistance, +2 for physical assistance Toilet Transfer: Minimal assistance, +2 for physical assistance, BSC Toileting- Clothing Manipulation and Hygiene: Maximal assistance, Sit  to/from stand Functional mobility during ADLs: Minimal assistance, +2 for safety/equipment General ADL Comments: initiated sternal precautions with application to ADL tasks    Mobility  Overal bed mobility: Needs Assistance Bed Mobility: Sit to Supine, Sidelying to Sit, Rolling Rolling: Modified independent (Device/Increase time) Sidelying to sit: Mod assist Supine to sit: Mod assist Sit to supine: Supervision General bed mobility comments: ModA to assist trunk elevation while maintaining sternal precautions, pt attempting to power into long sitting, cued for log roll    Transfers  Overall transfer level: Needs assistance Equipment used: 1 person hand held assist Transfers: Sit to/from Stand Sit to Stand: Mod assist General transfer comment: Cued for hand placement to maintain sternal precautions while standing, pt still reaching for UE support, reliant on HHA and modA to stand    Ambulation / Gait / Stairs / Wheelchair Mobility  Ambulation/Gait  Ambulation/Gait assistance: Mod assist Gait Distance (Feet): 32 Feet Assistive device: 1 person hand held assist Gait Pattern/deviations: Step-to pattern, Staggering left, Staggering right, Wide base of support General Gait Details: Slow, very unsteady (almost ataxic-like) gait with RUE HHA and frequent modA to maintain balance; pt reaching for LUE support on furniture/walls; 1x standing rest break at sink and 1x seated rest break; pt with difficulty multitasking while walking; poor insight into balance deficits Gait velocity: Decreased Gait velocity interpretation: <1.31 ft/sec, indicative of household ambulator    Posture / Balance Balance Overall balance assessment: Needs assistance Sitting balance-Leahy Scale: Fair Standing balance-Leahy Scale: Poor Standing balance comment: Reliant on UE support and external assist; unable to tolerate challenge    Special needs/care consideration BiPAP/CPAP n/a CPM  N/a Continuous Drip IV  N/a PICC  RUE Dialysis n/a Life Vest  N/a Oxygen room air Special Bed  N/a Trach Size  N/a Wound Vac n/a Skin sternal surgical site with staples; chest tube and temporary pacer sites, left leg surgical incision, abrasion to buttocks, posterior and upper; ecchymosis to buttocks bilaterally, skin tear to buttocks Bowel mgmt: incontinent LBM 10/8 Bladder mgmt: external catheter Diabetic mgmt: n/a Behavioral consideration cognition not at baseline Chemo/radiation n/a Designated visitor is Randall Hiss   Previous Home Environment  Living Arrangements: (Patient and his MOm live together for many years)  Lives With: Other (Comment)(Mom and patient live together) Available Help at Discharge: (Eric/firend an dpat to arrange caregiver support as needed s) Type of Home: House Home Layout: 1/2 bath on main level, Bed/bath upstairs Alternate Level Stairs-Rails: Right Alternate Level Stairs-Number of Steps: Flight Home Access: Stairs to enter Entrance Stairs-Rails: Right Entrance Stairs-Number of Steps: 2-3 Bathroom Shower/Tub: Chiropodist: Standard Bathroom Accessibility: Yes How Accessible: Accessible via walker Home Care Services: No  Discharge Living Setting Plans for Discharge Living Setting: Patient's home(Patietn and his Mom live together) Type of Home at Discharge: House Discharge Home Layout: 1/2 bath on main level, Bed/bath upstairs Discharge Home Access: Stairs to enter Entrance Stairs-Rails: Right Entrance Stairs-Number of Steps: 2 to 3 Discharge Bathroom Shower/Tub: Tub/shower unit Discharge Bathroom Toilet: Standard Discharge Bathroom Accessibility: Yes How Accessible: Accessible via walker Does the patient have any problems obtaining your medications?: No  Social/Family/Support Systems Patient Roles: Caregiver(42 year old Mom with mild dementia) Contact Information: Evette Cristal, friend and nieghbor. 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 [employer] Anticipated Caregiver: neighbors and hired  caregivers as recommended Anticipated Caregiver's Contact Information: Randall Hiss [phone] Ability/Limitations of Caregiver: Randall Hiss works; he will arrange caregivers as recommended  by [employer] Caregiver Availability: Other (Comment) Discharge Plan Discussed with Primary Caregiver: Yes Is Caregiver In Agreement with Plan?: Yes Does Caregiver/Family have Issues with Lodging/Transportation while Pt is in Rehab?: No Mom with mild dementia; Randall Hiss has caregivers providing her intermittent supervision since patient admitted. Randall Hiss is aware that I recommend additional caregiver support for patient for a few weeks after d/c from CIR. They chose CIR rather than SNF for they felt he can reach level to d/c home with caregiver support hired and by neighbors. Eric aware that CIR SW would not be arranging that caregiver support. It will be Randall Hiss.  Goals/Additional Needs Patient/Family Goal for Rehab: Mod I to supervision with PT, OT, and SLP Expected length of stay: ELOS 10 to 14c days; patient pushing for shorter LOS Special Service Needs: COgnitively patietn not at baseline Additional Information: STernal precautions Pt/Family Agrees to Admission and willing to participate: Yes Program Orientation Provided & Reviewed with Pt/Caregiver Including  Roles  & Responsibilities: Yes  Barriers to Discharge: Other (comments)(sternal precautions and need for caregiver support to be arr)  Decrease burden of Care through IP rehab admission: n/a  Possible need for SNF placement upon discharge: Randall Hiss and patient chose CIR rather than SNF for intensive rehab and Rehab MD oversight. Plan to d/c directly home with caregiver support to be arranged by Randall Hiss.  Patient Condition: I have reviewed medical records from [facility] , spoken with  patient and family member, Randall Hiss is [facility]. I met with patient at the bedside for inpatient rehabilitation assessment.  Patient will benefit from ongoing PT, OT and SLP, can actively participate in  3 hours of therapy a day 5 days of the week, and can make measurable gains during the admission.  Patient will also benefit from the coordinated team approach during an Inpatient Acute Rehabilitation admission.  The patient will receive intensive therapy as well as Rehabilitation physician, nursing, social worker, and care management interventions.  Due to bladder management, bowel management, safety, skin/wound care, disease management, medication administration, pain management and patient education the patient requires 24 hour a day rehabilitation nursing.  The patient is currently mod assist with mobility and basic ADLs.  Discharge setting and therapy post discharge at home with home health is anticipated.  Patient has agreed to participate in the Acute Inpatient Rehabilitation Program and will admit today.  Preadmission Screen Completed By:  Cleatrice Burke, 12/11/2018 11:54 AM ______________________________________________________________________   Discussed status with Dr. Posey Pronto  on  12/11/2018 at  16 and received approval for admission today.  Admission Coordinator:  Cleatrice Burke, RN, time  1210 Date  12/11/2018   Assessment/Plan: Diagnosis: Debility 1. Does the need for close, 24 hr/day Medical supervision in concert with the patient's rehab needs make it unreasonable for this patient to be served in a less intensive setting? Yes  2. Co-Morbidities requiring supervision/potential complications: tobacco abuse, tachypnea, leukocytosis, acute blood loss anemia 3. Due to safety, skin/wound care, disease management, pain management and patient education, does the patient require 24 hr/day rehab nursing? Yes 4. Does the patient require coordinated care of a physician, rehab nurse, PT, OT, and SLP to address physical and functional deficits in the context of the above medical diagnosis(es)? Yes Addressing deficits in the following areas: balance, endurance, locomotion, strength,  transferring, bathing, dressing, toileting and psychosocial support 5. Can the patient actively participate in an intensive therapy program of at least 3 hrs of therapy 5 days a week? Yes 6. The potential for patient to make measurable gains while on inpatient rehab is excellent 7. Anticipated functional outcomes upon discharge from inpatient rehab: modified independent PT, modified independent OT, n/a SLP 8. Estimated rehab length of stay to reach the above functional goals is: 12-16 days. 9. Anticipated discharge destination: Home 10. Overall Rehab/Functional Prognosis: excellent and fair   MD Signature: Delice Lesch, MD, ABPMR

## 2018-12-11 NOTE — Progress Notes (Signed)
Pacer wire removed by PA, tolerated  well. Anterior chest painted with betadine.

## 2018-12-11 NOTE — H&P (Addendum)
Physical Medicine and Rehabilitation Admission H&P     Chief Complaint  Patient presents with   Respiratory Distress  :  HPI: Curtis [name] is a 68 year old right-handed male with history of tobacco abuse on no prescription medications. History taken from chart review and patient due to delayed processing. Per chart review he lives with elderly parent. He is retired. Independent prior to admission to level home with 3 steps to entry. 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 Presented 11/24/2018 with worsening SOB, tachycardia, and tachypnea. Oxygen saturations 60% on room air. There were reports of multiple syncopal episodes in route but patient never lost pulses. He did receive epinephrine 0.3 mg as well as Solu-Medrol. Patient placed on BiPAP, high-sensitivity troponin 63. ECG showed narrow complex tachycardia. BUN 16, creatinine 1.4, COVID negative. Chest x-ray showed mild cardiomegaly and moderate pulmonary edema. Echocardiogram with EF ~25%. Underwent cardiac catheterization per Dr. Verdis Prime showing severe multivessel CAD with 40% tubular left main. 60 to 70% proximal LAD tandem lesions. Severe 80% diffuse disease in the large first diagonal. Underwent CABG x3 as well as mitral valve replacement 11/30/2018 per Dr. Vickey Sages of [facility]. Postoperative cardiogenic shock with mediastinal bleeding and patient returned emergently to the OR and underwent removal of Impella left ventricular assistive device evacuation of left hemothorax 12/04/2018. Patient was extubated 12/05/2018. CT negative for pulmonary emboli or significant effusion or infiltrate. Hospital course further complicated by pain and ABLA, 10.1 as well as leukocytosis 17,300 improved to 15,900 and patient had been maintained on empiric vancomycin and cefepime. Urine culture and blood cultures no growth. Subcutaneous Lovenox for DVT prophylaxis. Patient is currently maintained also on aspirin 325 mg daily after CABG. Patient had  initially been placed on milrinone transition to amiodarone 200 mg twice daily. Findings of mildly elevated hemoglobin A1c 5.8 with sliding scale insulin and low-dose Levemir added. Patient is tolerating a regular consistency diet. There have been intermittent bouts of confusion that is steadily improve postoperatively with therapies initiated and sternal precautions as directed. Therapy evaluations completed and patient was admitted for a comprehensive rehab program. Please see preadmission assessment from earlier today as well as well as acute H&P filed under attestation due to technical error.  Review of Systems  Constitutional: Positive for malaise/fatigue. Negative for chills and fever.  HENT: Negative for hearing loss.  Eyes: Negative for blurred vision and double vision.  Respiratory: Positive for shortness of breath.  Gastrointestinal: Positive for constipation. Negative for heartburn, nausea and vomiting.  Genitourinary: Negative for dysuria, flank pain and hematuria.  Skin: Negative for rash.  All other systems reviewed and are negative.       Past Medical History:  Diagnosis Date   Coronary artery disease    Elevated troponin 11/25/2018   Tobacco use         Past Surgical History:  Procedure Laterality Date   CORONARY ARTERY BYPASS GRAFT N/A 11/30/2018   Procedure: MEDIASTINAL POST-OP BLEED; Surgeon: Linden Dolin, MD; Location: [facility] OR; Service: Open Heart Surgery; Laterality: N/A;   CORONARY ARTERY BYPASS GRAFT N/A 11/30/2018   Procedure: CORONARY ARTERY BYPASS GRAFTING (CABG) x Three , using left internal mammary artery and left leg greater saphenous vein harvested endoscopically; Surgeon: Linden Dolin, MD; Location: [facility] OR; Service: Open Heart Surgery; Laterality: N/A;   LEFT HEART CATH AND CORONARY ANGIOGRAPHY N/A 11/24/2018   Procedure: LEFT HEART CATH AND CORONARY ANGIOGRAPHY; Surgeon: Lyn Records, MD; Location: [facility] INVASIVE CV [facility]; Service: Cardiovascular;  Laterality:  N/A;   MITRAL VALVE REPAIR N/A 11/30/2018   Procedure: MITRAL VALVE REPLACEMENT (MVR) USING MAGNA EASE SIZE 27mm; Surgeon: Linden DolinAtkins, Broadus Z, MD; [facility]; Service: Open Heart Surgery; Laterality: N/A;   PLACEMENT OF IMPELLA LEFT VENTRICULAR ASSIST DEVICE N/A 11/30/2018   Procedure: PLACEMENT OF IMPELLA LEFT VENTRICULAR ASSIST DEVICE; Surgeon: Linden DolinAtkins, Broadus Z, MD; Location: [facility] OR; Service: Open Heart Surgery; Laterality: N/A;   REMOVAL OF IMPELLA LEFT VENTRICULAR ASSIST DEVICE N/A 12/04/2018   Procedure: REMOVAL OF IMPELLA LEFT VENTRICULAR ASSIST DEVICE. EVACUATION OF LEFT HEMOTHORAX; Surgeon: Linden DolinAtkins, Broadus Z, MD; Location: [facility] OR; Service: Open Heart Surgery; Laterality: N/A;   STERNAL INCISION RECLOSURE  12/04/2018   Procedure: Sternal Plating with Sternal Rewiring; Surgeon: Linden DolinAtkins, Broadus Z, MD; Location: [facility] OR; Service: Open Heart Surgery;;   TEE WITHOUT CARDIOVERSION N/A 11/26/2018   Procedure: TRANSESOPHAGEAL ECHOCARDIOGRAM (TEE); Surgeon: Jake BatheSkains, Mark C, MD; [facility]; Service: Cardiovascular; Laterality: N/A;   TEE WITHOUT CARDIOVERSION N/A 11/30/2018   Procedure: TRANSESOPHAGEAL ECHOCARDIOGRAM (TEE); Surgeon: Linden DolinAtkins, Broadus Z, MD; [facility]; Service: Open Heart Surgery; Laterality: N/A;   TEE WITHOUT CARDIOVERSION N/A 12/04/2018   Procedure: TRANSESOPHAGEAL ECHOCARDIOGRAM (TEE); Surgeon: Linden DolinAtkins, Broadus Z, MD; [facility]; Service: Open Heart Surgery; Laterality: N/A;        Family History  Problem Relation Age of Onset   Heart attack Father    Heart attack Mother    Social History: reports that he has been smoking cigarettes. He has been smoking about 0.25 packs per day. He has never used smokeless tobacco. He reports that he does not drink alcohol or use drugs.  Allergies:       Allergies  Allergen Reactions   Penicillins Rash and Other (See Comments)    Tolerated cefepime 12/2018  Did it involve swelling of the  face/tongue/throat, SOB, or low BP? Unknown  Did it involve sudden or severe rash/hives, skin peeling, or any reaction on the inside of your mouth or nose? Yes  Did you need to seek medical attention at a hospital or doctor's office? Unknown  When did it last happen? childhood  If all above answers are "NO", may proceed with cephalosporin use.    No medications prior to admission.   Drug Regimen Review  Drug regimen was reviewed and remains appropriate with no significant issues identified  Home:  Home Living  Family/patient expects to be discharged to:: Private residence  Living Arrangements: Parent(Pt care for mother with Alzheimers)  Available Help at Discharge: Family, Neighbor, Available PRN/intermittently  Type of Home: House  Home Access: Stairs to enter  Entergy CorporationEntrance Stairs-Number of Steps: 2-3  Entrance Stairs-Rails: Right  Home Layout: Two level  Alternate Level Stairs-Number of Steps: Flight  Alternate Level Stairs-Rails: Right  Bathroom Shower/Tub: Medical sales representativeTub/shower unit  Bathroom Toilet: Standard  Home Equipment: None  Functional History:  Prior Function  Level of Independence: Independent  Comments: Independent with mobility, retired from work; Psychologist, 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, 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  Functional Status:  Mobility:  Bed Mobility  Overal bed mobility: Needs Assistance  Bed Mobility: Sit to Supine, Sidelying to Sit, Rolling  Rolling: Modified independent (Device/Increase time)  Sidelying to sit: Mod assist  Supine to sit: Mod assist  Sit to supine: Supervision  General bed mobility comments: ModA to assist trunk elevation while maintaining sternal precautions, pt attempting to power into long sitting, cued for log roll  Transfers  Overall transfer level: Needs assistance  Equipment used: 1 person  hand held assist  Transfers: Sit to/from Stand  Sit to Stand: Mod assist  General transfer comment: Cued for hand placement to maintain  sternal precautions while standing, pt still reaching for UE support, reliant on HHA and modA to stand  Ambulation/Gait  Ambulation/Gait assistance: Mod assist  Gait Distance (Feet): 32 Feet  Assistive device: 1 person hand held assist  Gait Pattern/deviations: Step-to pattern, Staggering left, Staggering right, Wide base of support  General Gait Details: Slow, very unsteady (almost ataxic-like) gait with RUE HHA and frequent modA to maintain balance; pt reaching for LUE support on furniture/walls; 1x standing rest break at sink and 1x seated rest break; pt with difficulty multitasking while walking; poor insight into balance deficits  Gait velocity: Decreased  Gait velocity interpretation: <1.31 ft/sec, indicative of household ambulator   ADL:  ADL  Overall ADL's : Needs assistance/impaired  Eating/Feeding: Set up, Sitting  Grooming: Wash/dry face, Set up, Sitting  Upper Body Bathing: Moderate assistance, Sitting  Lower Body Bathing: Maximal assistance, Sitting/lateral leans  Upper Body Dressing : Moderate assistance, Sitting  Lower Body Dressing: Moderate assistance, +2 for physical assistance  Toilet Transfer: Minimal assistance, +2 for physical assistance, BSC  Toileting- Clothing Manipulation and Hygiene: Maximal assistance, Sit to/from stand  Functional mobility during ADLs: Minimal assistance, +2 for safety/equipment  General ADL Comments: initiated sternal precautions with application to ADL tasks  Cognition:  Cognition  Overall Cognitive Status: Impaired/Different from baseline  Orientation Level: Oriented X4, Oriented to person, Oriented to place, Oriented to time, Oriented to situation  Cognition  Arousal/Alertness: Awake/alert  Behavior During Therapy: Flat affect  Overall Cognitive Status: Impaired/Different from baseline  Area of Impairment: Attention, Following commands, Safety/judgement, Problem solving  Current Attention Level: Selective  Following Commands: Follows  multi-step commands inconsistently  Safety/Judgement: Decreased awareness of deficits, Decreased awareness of safety  Problem Solving: Requires verbal cues  General Comments: More alert this session  Physical Exam:  Blood pressure 110/60, pulse 90, temperature 98.2 F (36.8 C), temperature source Oral, resp. rate (!) 21, height 5\' 9"  (1.753 m), weight 85.8 kg, SpO2 94 %.  Physical Exam  Vitals reviewed.  Constitutional: He is oriented to person, place, and time. He appears well-developed and well-nourished.  HENT:  Head: Normocephalic and atraumatic.  Eyes: EOM are normal. Right eye exhibits no discharge. Left eye exhibits no discharge.  Neck: No tracheal deviation present. No thyromegaly present.  Respiratory: Effort normal. No respiratory distress.  GI: He exhibits no distension.  Musculoskeletal:  Comments: No edema or tenderness in extremities  Neurological: He is alert and oriented to person, place, and time.  Provides his name and age with some delay and at times decrease attention.  Follows simple commands. Motor: 4-/5 throughout  Skin:  Midline large chest incision clean and dry  Psychiatric: His affect is blunt. He is slowed.   Lab Results Last 48 Hours  Imaging Results (Last 48 hours)    Medical Problem List and Plan:  1. Debility secondary to CAD with non-STEMI status post CABG with MVR 11/30/2018 with postoperative mediastinal bleeding/cardiogenic shock/acute respiratory failure with removal of Impella left ventricular assistive device evacuation of left hemothorax 12/04/2018. Sternal precautions  Admit to CIR  2. Antithrombotics:  -DVT/anticoagulation: Lovenox. Monitor for any bleeding episodes  -antiplatelet therapy: Aspirin 325 mg daily  3. Pain Management: Tramadol as needed  4. Mood: Provide emotional support  -antipsychotic agents: N/A  5. Neuropsych: This patient is? Fully capable of making decisions on his own behalf.  6. Skin/Wound Care: Routine skin checks  7. Fluids/Electrolytes/Nutrition: Routine in and outs. CMP ordered  8. Acute blood loss anemia. CBC ordered  9. Hypertension. Entresto 24-26 mg twice daily, Aldactone 25 mg daily, Demadex 20 mg daily. Monitor with increased mobility  10. PAF. Amiodarone 200 mg daily. Cardiac rate controlled. Monitor with increased physical exertion.  11. Leukocytosis. Improved. Urine culture blood cultures no growth to date. Empiric vancomycin and cefepime discontinued. Repeat CBC ordered.  12. Prediabetes. Levemir 5 units every 12 hours. Consider transition to oral agent if possible. Diabetic teaching. Monitor with increased mobility.  13. Combined CHF  EF of 25-30%  Daily weights    Charlton Amor, PA-C  12/11/2018   I have personally performed a face to face diagnostic evaluation, including, but not limited to relevant history and physical exam findings, of this patient and developed relevant assessment and plan. Additionally, I have reviewed and concur with the physician assistant's documentation above.  Maryla Morrow, MD, ABPMR  The patient's status has not changed. The original post admission physician evaluation remains appropriate, and any changes from the  pre-admission screening or documentation from the acute chart are noted above.   Maryla Morrow, MD, ABPMR

## 2018-12-11 NOTE — Care Management Important Message (Signed)
Important Message  Patient Details  Name: Curtis [name] MRN: [id number] Date of Birth: Mar 02, 1951   Medicare Important Message Given:  Yes     Shelda Altes 12/11/2018, 3:10 PM

## 2018-12-11 NOTE — Progress Notes (Addendum)
      [address]       [place] [address]             [phone]        7 Days Post-Op Procedure(s) (LRB): REMOVAL OF IMPELLA LEFT VENTRICULAR ASSIST DEVICE.  EVACUATION OF LEFT HEMOTHORAX (N/A) TRANSESOPHAGEAL ECHOCARDIOGRAM (TEE) (N/A) Sternal Plating with Sternal Rewiring  Subjective: Patient just pulled up in bed. He has no specific complaints this am.  Objective: Vital signs in last 24 hours: Temp:  [96.6 F (35.9 C)-98.4 F (36.9 C)] 98.2 F (36.8 C) (10/09 0314) Pulse Rate:  [57-94] 90 (10/08 2338) Cardiac Rhythm: Normal sinus rhythm (10/09 0400) Resp:  [13-26] 21 (10/09 0314) BP: (96-149)/(45-100) 110/60 (10/09 0314) SpO2:  [94 %-100 %] 94 % (10/09 0314) Weight:  [85.8 kg] 85.8 kg (10/09 0509)  Pre op weight 83.5 kg Current Weight  12/11/18 85.8 kg    Hemodynamic parameters for last 24 hours: CVP:  [3 mmHg-4 mmHg] 4 mmHg  Intake/Output from previous day: 10/08 0701 - 10/09 0700 In: 647 [I.V.:60; IV Piggyback:587] Out: 9767 [Urine:3425; Stool:5]   Physical Exam:  Cardiovascular: RRR, no murmurs, gallops, or rubs. Pulmonary: Clear to auscultation bilaterally; no rales, wheezes, or rhonchi. Abdomen: Soft, non tender, bowel sounds present. Extremities: Mild bilateral lower extremity edema. Wounds: Clean and dry.  No erythema or signs of infection.  Lab Results: CBC: Recent Labs    12/10/18 0750 12/11/18 0330  WBC 12.3* 15.9*  HGB 10.1* 10.8*  HCT 32.3* 34.4*  PLT 329 362   BMET:  Recent Labs    12/10/18 0750 12/11/18 0330  NA 136 135  K 3.3* 3.9  CL 104 102  CO2 22 22  GLUCOSE 94 118*  BUN 18 24*  CREATININE 1.01 1.24  CALCIUM 7.4* 7.9*    PT/INR:  Lab Results  Component Value Date   INR 1.5 (H) 11/30/2018   INR 1.7 (H) 11/30/2018   INR 1.8 (H) 11/30/2018   ABG:  INR: Will add last result for INR, ABG once components are confirmed Will add last 4 CBG results once components are confirmed   Assessment/Plan: 1. CV-Post cardiogenic shock. Impella removed 10/02.  Previous PAF so on Amiodarone drip. He is in SR, first degree heart block. On Amiodarone 200 mg bid, Entresto bid and Spironolactone 25 mg daily.  2. Pulmonary-Evacuation of hemothorax 10/02 and extubated on 10/03. CXR ordered for today but not taken yet. Encourage incentive spirometer and flutter valve. 3. Volume overload- CVP 3. On Demadex 40 mg orally daily and has had good diuresis. Creatinine slightly increased to 1.24 this am 4. ABL anemia-H and H this am stable at 10.8 and 34.4 5. Supplement potassium 7. Leukocytosis-WBC increased to 15,900 this am. New midline catheter placed yesterday. No sign of wound infection,UC and blood cultures show no growth. Await today's CXR to see if pulmonary etiology.On Vancomycin and Maxipime. 8. Patient still with EPW. Will remove EPW today 9. GI- Appreciate nutrition's assistance;encourage po, prostat and magic cup 10. CBGs 163/81/108. Pre op HGA1C 5.8. He likely has prediabetes. Will need follow up with medical doctor after discharge and will provide nutrition recommendations with discharge paperwork. Will stop accu checks and SS PRN 11. Deconditioning-continue with PT/OT. Await to see if CIR candidate vs SNF placement  Donielle M ZimmermanPA-C 12/11/2018,7:32 AM [phone]  Pt seen and examined; agree with note. Plan discharge to CIR.  Cuong Moorman Z. Orvan Seen, [place]

## 2018-12-11 NOTE — Progress Notes (Signed)
Received pt. As a new admission. 

## 2018-12-11 NOTE — Progress Notes (Signed)
Physical Therapy Treatment Patient Details Name: Curtis [name] MRN: [id number] DOB: [birth year] Today's Date: 12/11/2018    History of Present Illness Curtis [name] is a 68 y.o. male admitted 11/23/18 with sudden onset SOB. S/p CABG & bioprosthetic MVR with Impella placement on 9/28; this was complicated by severe coagulopathy/bleeding requiring return to OR. S/p Impella removal and evacuation of left hemothorax on 10/2. ETT 9/28-9/30, and 10/2-10/3. No significant PMH.   PT Comments    Pt progressing with mobility. Limited by decreased activity tolerance and generalized weakness, requiring increased assist to maintain sternal precautions with mobility. Pt with poor insight into deficits and decreased awareness. Continue to recommend intensive CIR-level therapies to maximize functional mobility and independence prior to return home.   Follow Up Recommendations  CIR;Supervision for mobility/OOB     Equipment Recommendations  (defer to next venue)    Recommendations for Other Services       Precautions / Restrictions Precautions Precautions: Fall;Sternal    Mobility  Bed Mobility Overal bed mobility: Needs Assistance Bed Mobility: Rolling;Sidelying to Sit Rolling: Modified independent (Device/Increase time) Sidelying to sit: Mod assist       General bed mobility comments: Attempted log roll with bed flat; pt rolls well with cues, requiries modA to assist trunk elevation from sidelying while maintaining sternal precautions  Transfers Overall transfer level: Needs assistance Equipment used: 1 person hand held assist Transfers: Sit to/from Stand Sit to Stand: Mod assist         General transfer comment: Initial attempt to stand with posterior LOB back to sitting EOB; reliant on momentum and modA to perform 2x more standing trials. Frequent cues for sequencing/safety, especially with regards to sternal precautions  Ambulation/Gait Ambulation/Gait assistance: Mod assist Gait  Distance (Feet): 12 Feet Assistive device: None Gait Pattern/deviations: Step-to pattern;Staggering left;Staggering right;Wide base of support Gait velocity: Decreased   General Gait Details: Slow, very unsteady gait with pt reaching to BUE support on furniture/walls, requiring frequent modA to maintain balance; poor insight into deficits and potential options to correct this   Stairs             Wheelchair Mobility    Modified Rankin (Stroke Patients Only)       Balance Overall balance assessment: Needs assistance   Sitting balance-Leahy Scale: Fair       Standing balance-Leahy Scale: Poor Standing balance comment: Reliant on UE support and external assist; unable to tolerate challenge                            Cognition Arousal/Alertness: Awake/alert Behavior During Therapy: WFL for tasks assessed/performed;Flat affect Overall Cognitive Status: Impaired/Different from baseline Area of Impairment: Attention;Following commands;Safety/judgement;Problem solving                   Current Attention Level: Selective   Following Commands: Follows multi-step commands inconsistently;Follows one step commands inconsistently Safety/Judgement: Decreased awareness of deficits;Decreased awareness of safety   Problem Solving: Requires verbal cues        Exercises      General Comments General comments (skin integrity, edema, etc.): BP via L forearm (due to bilateral UE restrictions) -- supine BP 92/35, sitting BP 80/51, standing BP 110/85 (pt unable to tolerate standing >1 min due to fatigue)      Pertinent Vitals/Pain Pain Assessment: Faces Faces Pain Scale: Hurts a little bit Pain Location: Generalized, sternum Pain Descriptors / Indicators: Discomfort;Grimacing Pain Intervention(s): Monitored during session  Home Living   Living Arrangements: (Patient and his MOm live together for many years) Available Help at Discharge: (Eric/firend an  dpat to arrange caregiver support as needed s)       Home Layout: 1/2 bath on main level;Bed/bath upstairs        Prior Function            PT Goals (current goals can now be found in the care plan section) Progress towards PT goals: Progressing toward goals    Frequency    Min 3X/week      PT Plan Current plan remains appropriate    Co-evaluation              AM-PAC PT "6 Clicks" Mobility   Outcome Measure  Help needed turning from your back to your side while in a flat bed without using bedrails?: A Little Help needed moving from lying on your back to sitting on the side of a flat bed without using bedrails?: A Lot Help needed moving to and from a bed to a chair (including a wheelchair)?: A Lot Help needed standing up from a chair using your arms (e.g., wheelchair or bedside chair)?: A Lot Help needed to walk in hospital room?: A Lot Help needed climbing 3-5 steps with a railing? : A Lot 6 Click Score: 13    End of Session   Activity Tolerance: Patient tolerated treatment well Patient left: in chair;with call bell/phone within reach;with chair alarm set Nurse Communication: Mobility status PT Visit Diagnosis: Other abnormalities of gait and mobility (R26.89);Muscle weakness (generalized) (M62.81)     Time: 9528-4132 PT Time Calculation (min) (ACUTE ONLY): 24 min  Charges:  $Therapeutic Activity: 23-37 mins                    Mabeline Caras, PT, DPT Acute Rehabilitation Services  Pager [phone] Office [place] 12/11/2018, 1:52 PM

## 2018-12-12 ENCOUNTER — Inpatient Hospital Stay (HOSPITAL_COMMUNITY): Payer: Medicare Other | Admitting: Speech Pathology

## 2018-12-12 ENCOUNTER — Inpatient Hospital Stay (HOSPITAL_COMMUNITY): Payer: Medicare Other | Admitting: Occupational Therapy

## 2018-12-12 ENCOUNTER — Inpatient Hospital Stay (HOSPITAL_COMMUNITY): Payer: Medicare Other | Admitting: Physical Therapy

## 2018-12-12 DIAGNOSIS — Z951 Presence of aortocoronary bypass graft: Secondary | ICD-10-CM

## 2018-12-12 DIAGNOSIS — R5381 Other malaise: Principal | ICD-10-CM

## 2018-12-12 LAB — CULTURE, BLOOD (ROUTINE X 2)
Culture: NO GROWTH
Culture: NO GROWTH
Special Requests: ADEQUATE
Special Requests: ADEQUATE

## 2018-12-12 MED ORDER — TORSEMIDE 10 MG PO TABS
10.0000 mg | ORAL_TABLET | Freq: Every day | ORAL | Status: DC
  Administered 2018-12-13 – 2018-12-14 (×2): 10 mg via ORAL
  Filled 2018-12-12 (×2): qty 1

## 2018-12-12 MED ORDER — SODIUM CHLORIDE 0.9% FLUSH
10.0000 mL | Freq: Two times a day (BID) | INTRAVENOUS | Status: DC
  Administered 2018-12-12: 20 mL
  Administered 2018-12-13 – 2018-12-14 (×2): 10 mL

## 2018-12-12 MED ORDER — SODIUM CHLORIDE 0.9% FLUSH
10.0000 mL | INTRAVENOUS | Status: DC | PRN
  Administered 2018-12-13: 16:00:00 10 mL
  Filled 2018-12-12: qty 40

## 2018-12-12 NOTE — Evaluation (Signed)
Physical Therapy Assessment and Plan  Patient Details  Name: Curtis [name] MRN: [id number] Date of Birth: 1950/12/25  PT Diagnosis: Abnormal posture, Abnormality of gait, Cognitive deficits, Difficulty walking, Muscle weakness and Pain in surgical site Rehab Potential: Good ELOS: ~2 weeks   Today's Date: 12/12/2018 PT Individual Time: 1306-1406 PT Individual Time Calculation (min): 60 min    Problem List:  Patient Active Problem List   Diagnosis Date Noted  . Debility 12/11/2018  . S/P MVR (mitral valve replacement)   . Prediabetes   . PAF (paroxysmal atrial fibrillation) ([place])   . Acute blood loss anemia   . Chest tube in place   . S/P CABG x 3 11/30/2018  . Coronary artery disease involving native heart without angina pectoris   . Acute on chronic combined systolic and diastolic CHF (congestive heart failure) ([place])   . Acute respiratory failure with hypoxia and hypercapnia ([place]) 11/24/2018  . Pulmonary edema 11/24/2018  . Hypertensive emergency 11/24/2018  . Elevated troponin 11/24/2018  . Leukocytosis 11/24/2018  . NSTEMI (non-ST elevated myocardial infarction) ([place])   . Flash pulmonary edema ([place])   . Hypertension     Past Medical History:  Past Medical History:  Diagnosis Date  . Coronary artery disease   . Elevated troponin 11/25/2018  . Tobacco use    Past Surgical History:  Past Surgical History:  Procedure Laterality Date  . CORONARY ARTERY BYPASS GRAFT N/A 11/30/2018   Procedure: MEDIASTINAL POST-OP BLEED;  Surgeon: Wonda Olds, MD;  Location: [place] [facility];  Service: Open Heart Surgery;  Laterality: N/A;  . CORONARY ARTERY BYPASS GRAFT N/A 11/30/2018   Procedure: CORONARY ARTERY BYPASS GRAFTING (CABG) x Three , using left internal mammary artery and left leg greater saphenous vein harvested endoscopically;  Surgeon: Wonda Olds, MD;  Location: [place];  Service: Open Heart Surgery;  Laterality: N/A;  . LEFT HEART CATH AND CORONARY ANGIOGRAPHY N/A 11/24/2018    Procedure: LEFT HEART CATH AND CORONARY ANGIOGRAPHY;  Surgeon: Belva Crome, MD;  Location: [facility];  Service: Cardiovascular;  Laterality: N/A;  . MITRAL VALVE REPAIR N/A 11/30/2018   Procedure: MITRAL VALVE REPLACEMENT (MVR) USING MAGNA EASE SIZE 11m;  Surgeon: AWonda Olds MD;  [place]  Service: Open Heart Surgery;  Laterality: N/A;  . PLACEMENT OF IMPELLA LEFT VENTRICULAR ASSIST DEVICE N/A 11/30/2018   Procedure: PLACEMENT OF IMPELLA LEFT VENTRICULAR ASSIST DEVICE;  Surgeon: AWonda Olds MD;  [place]  Service: Open Heart Surgery;  Laterality: N/A;  . REMOVAL OF IMPELLA LEFT VENTRICULAR ASSIST DEVICE N/A 12/04/2018   Procedure: REMOVAL OF IMPELLA LEFT VENTRICULAR ASSIST DEVICE.  EVACUATION OF LEFT HEMOTHORAX;  Surgeon: AWonda Olds MD;  [place] [facility]  Service: Open Heart Surgery;  Laterality: N/A;  . STERNAL INCISION RECLOSURE  12/04/2018   Procedure: Sternal Plating with Sternal Rewiring;  Surgeon: AWonda Olds MD;  [facility];  Service: Open Heart Surgery;;  . TEE WITHOUT CARDIOVERSION N/A 11/26/2018   Procedure: TRANSESOPHAGEAL ECHOCARDIOGRAM (TEE);  Surgeon: SJerline Pain MD;  [facility];  Service: Cardiovascular;  Laterality: N/A;  . TEE WITHOUT CARDIOVERSION N/A 11/30/2018   Procedure: TRANSESOPHAGEAL ECHOCARDIOGRAM (TEE);  Surgeon: AWonda Olds MD;  [place] [facility]  Service: Open Heart Surgery;  Laterality: N/A;  . TEE WITHOUT CARDIOVERSION N/A 12/04/2018   Procedure: TRANSESOPHAGEAL ECHOCARDIOGRAM (TEE);  Surgeon: AWonda Olds MD;  [place] [facility]  Service: Open Heart Surgery;  Laterality: N/A;  Assessment & Plan Clinical Impression: Patient is a 68 y.o. year old right-handed male with history of tobacco abuse on no prescription medications. History taken from chart review and patient due to delayed processing. Per chart review he lives with elderly parent. He is retired. Independent prior to admission  to level home with 3 steps to entry. 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 Presented 11/24/2018 with worsening SOB, tachycardia, and tachypnea. Oxygen saturations 60% on room air. There were reports of multiple syncopal episodes in route but patient never lost pulses. He did receive epinephrine 0.3 mg as well as Solu-Medrol. Patient placed on BiPAP, high-sensitivity troponin 63. ECG showed narrow complex tachycardia. BUN 16, creatinine 1.4, COVID negative. Chest x-ray showed mild cardiomegaly and moderate pulmonary edema. Echocardiogram with EF ~25%. Underwent cardiac catheterization per Dr. Daneen Schick showing severe multivessel CAD with 40% tubular left main. 60 to 70% proximal LAD tandem lesions. Severe 80% diffuse disease in the large first diagonal. Underwent CABG x3 as well as mitral valve replacement 11/30/2018 per Dr. Orvan Seen of CVTS. Postoperative cardiogenic shock with mediastinal bleeding and patient returned emergently to the OR and underwent removal of Impella left ventricular assistive device evacuation of left hemothorax 12/04/2018. Patient was extubated 12/05/2018. CT negative for pulmonary emboli or significant effusion or infiltrate. Hospital course further complicated by pain and ABLA, 10.1 as well as leukocytosis 17,300 improved to 15,900 and patient had been maintained on empiric vancomycin and cefepime. Urine culture and blood cultures no growth. Subcutaneous Lovenox for DVT prophylaxis. Patient is currently maintained also on aspirin 325 mg daily after CABG. Patient had initially been placed on milrinone transition to amiodarone 200 mg twice daily. Findings of mildly elevated hemoglobin A1c 5.8 with sliding scale insulin and low-dose Levemir added. Patient is tolerating a regular consistency diet. There have been intermittent bouts of confusion that is steadily improve postoperatively with therapies initiated and sternal precautions as directed. Therapy evaluations completed  and patient was admitted for a comprehensive rehab program. Please see preadmission assessment from earlier today as well as well as acute H&P filed under attestation due to technical error. Patient transferred to CIR on 12/11/2018 .   Patient currently requires min with mobility secondary to muscle weakness, decreased cardiorespiratoy endurance, unbalanced muscle activation,  , decreased attention, decreased awareness, decreased safety awareness and decreased memory and decreased sitting balance, decreased standing balance, decreased postural control, decreased balance strategies and difficulty maintaining precautions.  Prior to hospitalization, patient was independent  with mobility and lived with Family(pt is caregier for mother with dementia) in a House home.  Home access is 2Stairs to enter.  Patient will benefit from skilled PT intervention to maximize safe functional mobility, minimize fall risk and decrease caregiver burden for planned discharge home with 24 hour supervision.  Anticipate patient will benefit from follow up [place] at discharge.  PT - End of Session Activity Tolerance: Tolerates 30+ min activity with multiple rests Endurance Deficit: Yes Endurance Deficit Description: frequent seated rest breaks PT Assessment Rehab Potential (ACUTE/IP ONLY): Good PT Barriers to Discharge: Decreased caregiver support;Inaccessible home environment;Home environment access/layout;Lack of/limited family support;Behavior PT Patient demonstrates impairments in the following area(s): Balance;Perception;Safety;Behavior;Edema;Endurance;Motor;Pain PT Transfers Functional Problem(s): Bed Mobility;Bed to Chair;Car;Furniture PT Locomotion Functional Problem(s): Ambulation;Stairs PT Plan PT Intensity: Minimum of 1-2 x/day ,45 to 90 minutes PT Frequency: 5 out of 7 days PT Duration Estimated Length of Stay: ~2 weeks PT Treatment/Interventions: Ambulation/gait training;Community reintegration;DME/adaptive  equipment instruction;Neuromuscular re-education;Psychosocial support;Stair training;UE/LE Strength taining/ROM;Balance/vestibular training;Discharge planning;Functional  electrical stimulation;Pain management;Skin care/wound management;Therapeutic Activities;UE/LE Coordination activities;Cognitive remediation/compensation;Disease management/prevention;Functional mobility training;Patient/family education;Therapeutic Exercise;Splinting/orthotics;Visual/perceptual remediation/compensation PT Transfers Anticipated Outcome(s): supervision PT Locomotion Anticipated Outcome(s): supervision with LRAD PT Recommendation Follow Up Recommendations: Home health PT;24 hour supervision/assistance Patient destination: Home Equipment Recommended: To be determined;Other (comment) Equipment Details: pt has none  Skilled Therapeutic Intervention Evaluation completed (see details above and below) with education on PT POC and goals and individual treatment initiated with focus on bed mobility, transfers, gait, stair navigation, and activity tolerance as well as education regarding daily therapy schedule, weekly team meetings, purpose of PT evaluation, and other CIR information. Pt received asleep, supine in bed but awakens easily and is agreeable to therapy session. Pt demonstrates some confusion during therapy session regarding his current location and situation but with increased alertness seemed to experience less confusion. Pt noted to be incontinent of urine and pt reports that he "couldn't really tell" that had occurred - pt educated on importance of calling for assistance when feeling the urge or in event of incontinence to have assistance with peri-care. Rolling R/L in bed, using bedrails, with cuing for sternal precautions while therapist performed max assist LB clothing management and posterior peri-care while pt performed anterior peri-care with set-up assist. Therapist educated pt regarding sternal precautions and  implications on mobility. Supine>sit with mod cuing for logroll technique with min assist for trunk upright. Stand pivot EOB>w/c, no AD, with min assist for balance. Performs sit<>stands with or without R HHA and min assist for balance throughout session.  Transported to/from gym in w/c. Ambulated 11f, no AD, with R HHA and initially mod assist progressed to min assist for balance. Ascended/descended 4 steps using bilateral handrails with initial step-to pattern progressed to reciprocal pattern and min assist for balance. Ambulatory simulated car transfer (SUV height) with R HHA and min assist for balance - pt demonstrates decreased safety awareness via reaching outside the tube to grab bar on car as well as stepping sideways into the car - cuing for correction and increased safety. Ambulated ~163fup/down ramp with R HHA and using railing (despite cuing not to pt felt more comfortable with it) and min assist for balance. Transported back to room in w/c and pt's neighbor was present and pt left sitting up in w/c with needs in reach and seat belt alarm on.  PT Evaluation Precautions/Restrictions Precautions Precautions: Fall;Sternal Restrictions Weight Bearing Restrictions: No Other Position/Activity Restrictions: sternal precautions Pain Pain Assessment Pain Scale: 0-10 Pain Score: 0-No pain Home Living/Prior Functioning Home Living Available Help at Discharge: Friend(s);Available PRN/intermittently(friends work) Type of Home: House Home Access: Stairs to enter EnTechnical brewerf Steps: 2 Entrance Stairs-Rails: Right;Left(this is difficult for pt to remember) Home Layout: 1/2 bath on main level;Bed/bath upstairs;Two level Alternate Level Stairs-Number of Steps: Flight Alternate Level Stairs-Rails: Right;Left  Lives With: Family(pt is caregiver for his mother) Prior Function Level of Independence: Independent with homemaking with ambulation;Independent with gait;Independent with  transfers  Able to Take Stairs?: Yes Driving: No(quit [id number] due to impaired vision) 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, forensicmments: Independent with mobility, retired from work; 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, 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 tasks PerCytogeneticistithin Functional Limits(intact for body scheme and L/R side awareness) Praxis Praxis: Intact  Cognition Overall Cognitive Status: No family/caregiver present to determine baseline cognitive functioning(some confusion noted) Arousal/Alertness: Awake/alert Orientation Level: Oriented to place;Disoriented to time;Oriented to person;Oriented to situation(initially confidently stated today is December 13th on  second attempted stated it is October 11th) Attention: Focused;Sustained Focused Attention: Impaired Sustained Attention: Impaired Memory: Impaired Awareness: Impaired Problem Solving: Impaired Behaviors: Restless;Poor frustration tolerance Safety/Judgment: Impaired Comments: unable to recall and follow sternal precautions Sensation Sensation Light Touch: Appears Intact Hot/Cold: Not tested Proprioception: Appears Intact Stereognosis: Not tested Coordination Gross Motor Movements are Fluid and Coordinated: No Fine Motor Movements are Fluid and Coordinated: Yes Coordination and Movement Description: generalized motor weakness in LEs,  UEs limited by sternal precautions Heel Shin Test: decreased range of movement but symmetrical Motor  Motor Motor: Other (comment) Motor - Skilled Clinical Observations: generalized weakness and deconditioning  Mobility Bed Mobility Bed Mobility: Supine to Sit Supine to Sit: Minimal Assistance - Patient > 75% Transfers Transfers: Sit to Stand;Stand to Sit;Stand Pivot Transfers Sit to Stand: Minimal Assistance - Patient > 75% Stand to Sit: Minimal Assistance - Patient > 75% Stand Pivot Transfers:  Minimal Assistance - Patient > 75% Stand Pivot Transfer Details: Tactile cues for sequencing;Tactile cues for weight shifting;Tactile cues for placement;Tactile cues for posture;Verbal cues for technique;Verbal cues for sequencing Transfer (Assistive device): None Locomotion  Gait Ambulation: Yes Gait Assistance: Minimal Assistance - Patient > 75%;Moderate Assistance - Patient 50-74% Gait Distance (Feet): 69 Feet Assistive device: 1 person hand held assist Gait Assistance Details: Tactile cues for weight shifting;Tactile cues for sequencing;Tactile cues for initiation;Tactile cues for posture;Tactile cues for weight beaing;Verbal cues for technique;Verbal cues for sequencing Gait Gait: Yes Gait Pattern: Impaired Gait Pattern: Decreased step length - left;Decreased step length - right;Poor foot clearance - left;Poor foot clearance - right;Wide base of support Gait velocity: Decreased Stairs / Additional Locomotion Stairs: Yes Stairs Assistance: Minimal Assistance - Patient > 75% Stair Management Technique: Two rails Number of Stairs: 4 Height of Stairs: 6 Ramp: Minimal Assistance - Patient >75% Wheelchair Mobility Wheelchair Mobility: No  Trunk/Postural Assessment  Cervical Assessment Cervical Assessment: Within Functional Limits Thoracic Assessment Thoracic Assessment: Exceptions to WFL(mild thoracic rounding) Lumbar Assessment Lumbar Assessment: Within Functional Limits Postural Control Postural Control: Deficits on evaluation Righting Reactions: delayed and inadequate Postural Limitations: decreased  Balance Balance Balance Assessed: Yes Static Sitting Balance Static Sitting - Level of Assistance: 5: Stand by assistance Dynamic Sitting Balance Dynamic Sitting - Level of Assistance: 4: Min assist Static Standing Balance Static Standing - Level of Assistance: 4: Min assist Dynamic Standing Balance Dynamic Standing - Level of Assistance: 3: Mod assist Extremity Assessment   RLE Assessment RLE Assessment: Exceptions to [facility] RLE Strength Right Hip Flexion: 4-/5 Right Knee Flexion: 4/5 Right Knee Extension: 4/5 Right Ankle Dorsiflexion: 4/5 Right Ankle Plantar Flexion: 4/5 LLE Assessment LLE Assessment: Exceptions to [facility] LLE Strength Left Hip Flexion: 4+/5 Left Knee Flexion: 4+/5 Left Knee Extension: 4+/5 Left Ankle Dorsiflexion: 4+/5 Left Ankle Plantar Flexion: 4+/5    Refer to Care Plan for Long Term Goals  Recommendations for other services: None   Discharge Criteria: Patient will be discharged from PT if patient refuses treatment 3 consecutive times without medical reason, if treatment goals not met, if there is a change in medical status, if patient makes no progress towards goals or if patient is discharged from hospital.  The above assessment, treatment plan, treatment alternatives and goals were discussed and mutually agreed upon: by patient  Tawana Scale, PT, DPT 12/12/2018, 1:05 PM

## 2018-12-12 NOTE — Progress Notes (Addendum)
[place] PHYSICAL MEDICINE & REHABILITATION PROGRESS NOTE   Subjective/Complaints:  No issue overnite  Called by nurse soft BP asymptomatic Recorded intake fo fluid per meal ROS- neg CP, SOB,  N/V/D  Objective:   Dg Chest 2 View  Result Date: 12/11/2018 CLINICAL DATA:  Pleural effusion EXAM: CHEST - 2 VIEW COMPARISON:  Two days ago FINDINGS: Chronic cardiomegaly. Mitral valve replacement. CABG. Right PICC with tip at the SVC. Chronic mild interstitial coarsening. There is no edema, consolidation, or pneumothorax. Trace pleural fluid on both sides. Hyperinflation. IMPRESSION: Trace pleural effusions. Interval removal of chest tube with no visible pneumothorax. Electronically Signed   By: Marnee Spring M.D.   On: 12/11/2018 10:47   [place] Ekg Site Rite  Result Date: 12/10/2018 If Site Rite image not attached, placement could not be confirmed due to current cardiac rhythm.  Recent Labs    12/10/18 0750 12/11/18 0330  WBC 12.3* 15.9*  HGB 10.1* 10.8*  HCT 32.3* 34.4*  PLT 329 362   Recent Labs    12/10/18 0750 12/11/18 0330  NA 136 135  K 3.3* 3.9  CL 104 102  CO2 22 22  GLUCOSE 94 118*  BUN 18 24*  CREATININE 1.01 1.24  CALCIUM 7.4* 7.9*    Intake/Output Summary (Last 24 hours) at 12/12/2018 0902 Last data filed at 12/11/2018 2100 Gross per 24 hour  Intake 120 ml  Output -  Net 120 ml     Physical Exam: Vital Signs Blood pressure 107/63, pulse 89, temperature 98.1 F (36.7 C), resp. rate 17, height 5\' 8"  (1.727 m), weight 84.9 kg, SpO2 98 %.  General: No acute distress Mood and affect are appropriate Heart: Regular rate and rhythm no rubs murmurs or extra sounds Lungs: Clear to auscultation, breathing unlabored, no rales or wheezes Abdomen: Positive bowel sounds, soft nontender to palpation, nondistended Extremities: No clubbing, cyanosis, or edema Skin: Sternotomy incision CDI  Neurologic: Cranial nerves II through XII intact, motor strength is 5/5 in  bilateral deltoid, bicep, tricep, grip, hip flexor, knee extensors, ankle dorsiflexor and plantar flexor   Musculoskeletal: Full range of motion in all 4 extremities. No joint swelling    Assessment/Plan: 1. Functional deficits secondary to Debility following CABG  which require 3+ hours per day of interdisciplinary therapy in a comprehensive inpatient rehab setting.  Physiatrist is providing close team supervision and 24 hour management of active medical problems listed below.  Physiatrist and rehab team continue to assess barriers to discharge/monitor patient progress toward functional and medical goals  Care Tool:  Bathing              Bathing assist       Upper Body Dressing/Undressing Upper body dressing        Upper body assist      Lower Body Dressing/Undressing Lower body dressing            Lower body assist       Toileting Toileting    Toileting assist Assist for toileting: Moderate Assistance - Patient 50 - 74%     Transfers Chair/bed transfer  Transfers assist     Chair/bed transfer assist level: 2 Helpers     Locomotion Ambulation   Ambulation assist              Walk 10 feet activity   Assist           Walk 50 feet activity   Assist  Walk 150 feet activity   Assist           Walk 10 feet on uneven surface  activity   Assist           Wheelchair     Assist               Wheelchair 50 feet with 2 turns activity    Assist            Wheelchair 150 feet activity     Assist          Blood pressure 107/63, pulse 89, temperature 98.1 F (36.7 C), resp. rate 17, height 5\' 8"  (1.727 m), weight 84.9 kg, SpO2 98 %.    Medical Problem List and Plan:  1. Debility secondary to CAD with non-STEMI status post CABG with MVR 11/30/2018 with postoperative mediastinal bleeding/cardiogenic shock/acute respiratory failure with removal of Impella left ventricular assistive  device evacuation of left hemothorax 12/04/2018. Sternal precautions  CIR PT, OT  2. Antithrombotics:  -DVT/anticoagulation: Lovenox. Monitor for any bleeding episodes  -antiplatelet therapy: Aspirin 325 mg daily  3. Pain Management: Tramadol as needed  4. Mood: Provide emotional support  -antipsychotic agents: N/A  5. Neuropsych: This patient is? Fully capable of making decisions on his own behalf.  6. Skin/Wound Care: Routine skin checks  7. Fluids/Electrolytes/Nutrition: Routine in and outs. CMP ordered  8. Acute blood loss anemia. CBC ordered  9. Hypertension. Entresto 24-26 mg twice daily, Aldactone 25 mg daily, Demadex 20 mg daily. Monitor with increased mobility  Vitals:   12/11/18 1931 12/12/18 0600  BP: (!) 106/51 107/63  Pulse: 92 89  Resp: 20 17  Temp: 97.7 F (36.5 C) 98.1 F (36.7 C)  SpO2: 99% 98%   10. PAF. Amiodarone 200 mg daily. Cardiac rate controlled. Monitor with increased physical exertion.  11. Leukocytosis. recurrent. Urine culture blood cultures no growth to date. Empiric vancomycin and cefepime discontinued. Repeat CBC ordered.  12. Prediabetes. Levemir 5 units every 12 hours. Consider transition to oral agent if possible. Diabetic teaching. Monitor with increased mobility.  CBG (last 3)  Recent Labs    12/10/18 2344 12/11/18 0326 12/11/18 1629  GLUCAP 81 108* 130*   13. Combined CHF  EF of 25-30%  Daily weights , intake poor and BP soft will reduce demadex     LOS: 1 days A FACE TO FACE EVALUATION WAS PERFORMED  Luanna Salk Jasslyn Finkel 12/12/2018, 9:02 AM

## 2018-12-12 NOTE — Evaluation (Signed)
Speech Language Pathology Assessment and Plan  Patient Details  Name: Curtis [name] MRN: [id number] Date of Birth: Aug 21, 1950  SLP Diagnosis: Cognitive Impairments  Rehab Potential: Good ELOS: 2 weeks    Today's Date: 12/12/2018 SLP Individual Time: 1101-1150 SLP Individual Time Calculation (min): 49 min   Problem List:  Patient Active Problem List   Diagnosis Date Noted  . Debility 12/11/2018  . S/P MVR (mitral valve replacement)   . Prediabetes   . PAF (paroxysmal atrial fibrillation) ([place])   . Acute blood loss anemia   . Chest tube in place   . S/P CABG x 3 11/30/2018  . Coronary artery disease involving native heart without angina pectoris   . Acute on chronic combined systolic and diastolic CHF (congestive heart failure) ([place])   . Acute respiratory failure with hypoxia and hypercapnia ([place]) 11/24/2018  . Pulmonary edema 11/24/2018  . Hypertensive emergency 11/24/2018  . Elevated troponin 11/24/2018  . Leukocytosis 11/24/2018  . NSTEMI (non-ST elevated myocardial infarction) ([place])   . Flash pulmonary edema ([place])   . Hypertension    Past Medical History:  Past Medical History:  Diagnosis Date  . Coronary artery disease   . Elevated troponin 11/25/2018  . Tobacco use    Past Surgical History:  Past Surgical History:  Procedure Laterality Date  . CORONARY ARTERY BYPASS GRAFT N/A 11/30/2018   Procedure: MEDIASTINAL POST-OP BLEED;  Surgeon: Wonda Olds, MD;  Location: [facility];  Service: Open Heart Surgery;  Laterality: N/A;  . CORONARY ARTERY BYPASS GRAFT N/A 11/30/2018   Procedure: CORONARY ARTERY BYPASS GRAFTING (CABG) x Three , using left internal mammary artery and left leg greater saphenous vein harvested endoscopically;  Surgeon: Wonda Olds, MD;  Location: [facility];  Service: Open Heart Surgery;  Laterality: N/A;  . LEFT HEART CATH AND CORONARY ANGIOGRAPHY N/A 11/24/2018   Procedure: LEFT HEART CATH AND CORONARY ANGIOGRAPHY;  Surgeon: Belva Crome, MD;   Location: [facility];  Service: Cardiovascular;  Laterality: N/A;  . MITRAL VALVE REPAIR N/A 11/30/2018   Procedure: MITRAL VALVE REPLACEMENT (MVR) USING MAGNA EASE SIZE 67m;  Surgeon: AWonda Olds MD;  [place]  Service: Open Heart Surgery;  Laterality: N/A;  . PLACEMENT OF IMPELLA LEFT VENTRICULAR ASSIST DEVICE N/A 11/30/2018   Procedure: PLACEMENT OF IMPELLA LEFT VENTRICULAR ASSIST DEVICE;  Surgeon: AWonda Olds MD;  [place] [facility]  Service: Open Heart Surgery;  Laterality: N/A;  . REMOVAL OF IMPELLA LEFT VENTRICULAR ASSIST DEVICE N/A 12/04/2018   Procedure: REMOVAL OF IMPELLA LEFT VENTRICULAR ASSIST DEVICE.  EVACUATION OF LEFT HEMOTHORAX;  Surgeon: AWonda Olds MD;  [facility]  Service: Open Heart Surgery;  Laterality: N/A;  . STERNAL INCISION RECLOSURE  12/04/2018   Procedure: Sternal Plating with Sternal Rewiring;  Surgeon: AWonda Olds MD;  [facility];  Service: Open Heart Surgery;;  . TEE WITHOUT CARDIOVERSION N/A 11/26/2018   Procedure: TRANSESOPHAGEAL ECHOCARDIOGRAM (TEE);  Surgeon: SJerline Pain MD;  [facility];  Service: Cardiovascular;  Laterality: N/A;  . TEE WITHOUT CARDIOVERSION N/A 11/30/2018   Procedure: TRANSESOPHAGEAL ECHOCARDIOGRAM (TEE);  Surgeon: AWonda Olds MD;  [place] [facility]  Service: Open Heart Surgery;  Laterality: N/A;  . TEE WITHOUT CARDIOVERSION N/A 12/04/2018   Procedure: TRANSESOPHAGEAL ECHOCARDIOGRAM (TEE);  Surgeon: AWonda Olds MD;  [place] [facility]  Service: Open Heart Surgery;  Laterality: N/A;    Assessment / Plan / Recommendation Clinical Impression Curtis Hargadonis a  68 year old right-handed male with history of tobacco abuse on no prescription medications. History taken from chart review and patient due to delayed processing. 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 Presented 11/24/2018 with worsening SOB, tachycardia, and tachypnea. Oxygen  saturations 60% on room air. There were reports of multiple syncopal episodes in route but patient never lost pulses. Patient placed on BiPAP, high-sensitivity troponin 63. ECG showed narrow complex tachycardia. BUN 16, creatinine 1.4, COVID negative. Chest x-ray showed mild cardiomegaly and moderate pulmonary edema. Echocardiogram with EF ~25%. Underwent cardiac catheterization per Dr. Daneen Schick showing severe multivessel CAD with 40% tubular left main. 60 to 70% proximal LAD tandem lesions. Severe 80% diffuse disease in the large first diagonal. Underwent CABG x3 as well as mitral valve replacement 11/30/2018 per Dr. Orvan Seen of CV[facility]. Postoperative cardiogenic shock with mediastinal bleeding and patient returned emergently to the OR and underwent removal of Impella left ventricular assistive device evacuation of left hemothorax 12/04/2018. Patient was extubated 12/05/2018. Patient is tolerating a regular consistency diet. There have been intermittent bouts of confusion that is steadily improved postoperatively with therapies initiated and sternal precautions as directed. Therapy evaluations completed and patient was admitted for a comprehensive rehab program on 12/11/18.  Cognitive linguistic evaluation completed on 12/12/18. Informal assessment provided as pt was fatigued and eventually fell asleep at end of session. Prior to the session, SLP was made aware that pt was visually impaired d/t caterats. Pt able to see large shapes such as general shape of person but unable to read clock. SLP recieved pt in laying in bed with pt asking about the time. Pt expressed frustration tha tis was still morning and he expressed overall frustration with course of events while hospitalized. Pt stated that he didn't understand what had happened to him. SLP reviewed with pt who then seemed to recall all events and was frustration by SLP reviewing it with him. Pt does agree that he has increased difficulty recalling and using sternal  precautions and is disoriented in general (perhaps d/t inability to visually see a calendar while hospitalized). Pt stated that he didn't understand what had happened to him. SLP reviewed with pt who then seemed to recall all events and was frustration by SLP reviewing it with him. Pt does agree that he has increased difficulty recalling and using sternal precautions and is disoriented in general (perhaps d/t inability to visually see a calendar while hospitalized). Pt was frequently perseverative on questions regarding who was currently taking care of his mother. During this informal assessment, pt demonstrated deficits in recall of information, safety awareness, sustained attention to task and problem solving, Skilled ST is required to further assess pt's cognitive function, increase functional independence and aid in creating safe discharge plan for pt and his mother. It is likely that pt will require supervision at discharge.               SLP Assessment  Patient will need skilled [facility] during CIR admission    Recommendations  Recommendations for Other Services: Neuropsych consult Patient destination: Home Follow up Recommendations: 24 hour supervision/assistance;Home Health SLP Equipment Recommended: None recommended by SLP    SLP Frequency 3 to 5 out of 7 days   SLP Duration  SLP Intensity  SLP Treatment/Interventions 2 weeks  Minumum of 1-2 x/day, 30 to 90 minutes  Cognitive remediation/compensation;Internal/external aids;Patient/family education;Therapeutic Activities;Functional tasks    Pain Pain Assessment Pain Score: 0-No pain  Prior  Functioning Cognitive/Linguistic Baseline: Information not available Type of Home: House  Lives With: Family(pt is caregier for mother with dementia) Available Help at Discharge: Friend(s) Vocation: Retired  Industrial/product designer Term Goals: Week 1: SLP Short Term Goal 1 (Week 1): Given Min A cues, pt will recall sternal  precautions in 8 out of 10 opportunities. SLP Short Term Goal 2 (Week 1): Given Min A cues, pt will utilize sternal precautions in 8 out of 10 opportunities. SLP Short Term Goal 3 (Week 1): Pt will demosntrate selective attention for 30 minutes with Min A cues. SLP Short Term Goal 4 (Week 1): Pt will demonstrate intellectual awareness by identifying 3 physical and cognitive deficits with Min A cues. SLP Short Term Goal 5 (Week 1): Pt will ocmplete semi-complex problem solving tasks with Min A cues. SLP Short Term Goal 6 (Week 1): Pt will utilize compensatory memory strategies to recall information with Min A cues in 8 out of 10 opportunities.  Refer to Care Plan for Long Term Goals  Recommendations for other services: Neuropsych  Discharge Criteria: Patient will be discharged from SLP if patient refuses treatment 3 consecutive times without medical reason, if treatment goals not met, if there is a change in medical status, if patient makes no progress towards goals or if patient is discharged from hospital.  The above assessment, treatment plan, treatment alternatives and goals were discussed and mutually agreed upon: by patient  Karley Pho 12/12/2018, 11:59 AM

## 2018-12-12 NOTE — Evaluation (Signed)
Occupational Therapy Assessment and Plan  Patient Details  Name: Curtis [name] MRN: [id number] Date of Birth: 1950-08-09  OT Diagnosis: cognitive deficits, muscle weakness (generalized) and swelling of limb Rehab Potential: Rehab Potential (ACUTE ONLY): Good ELOS: 14-16 days   Today's Date: 12/12/2018 OT Individual Time: 0800-0900 OT Individual Time Calculation (min): 60 min     Problem List:  Patient Active Problem List   Diagnosis Date Noted  . Debility 12/11/2018  . S/P MVR (mitral valve replacement)   . Prediabetes   . PAF (paroxysmal atrial fibrillation) ([place])   . Acute blood loss anemia   . Chest tube in place   . S/P CABG x 3 11/30/2018  . Coronary artery disease involving native heart without angina pectoris   . Acute on chronic combined systolic and diastolic CHF (congestive heart failure) ([place])   . Acute respiratory failure with hypoxia and hypercapnia ([place]) 11/24/2018  . Pulmonary edema 11/24/2018  . Hypertensive emergency 11/24/2018  . Elevated troponin 11/24/2018  . Leukocytosis 11/24/2018  . NSTEMI (non-ST elevated myocardial infarction) ([place])   . Flash pulmonary edema ([place])   . Hypertension     Past Medical History:  Past Medical History:  Diagnosis Date  . Coronary artery disease   . Elevated troponin 11/25/2018  . Tobacco use    Past Surgical History:  Past Surgical History:  Procedure Laterality Date  . CORONARY ARTERY BYPASS GRAFT N/A 11/30/2018   Procedure: MEDIASTINAL POST-OP BLEED;  Surgeon: Wonda Olds, MD;  Location: [facility];  Service: Open Heart Surgery;  Laterality: N/A;  . CORONARY ARTERY BYPASS GRAFT N/A 11/30/2018   Procedure: CORONARY ARTERY BYPASS GRAFTING (CABG) x Three , using left internal mammary artery and left leg greater saphenous vein harvested endoscopically;  Surgeon: Wonda Olds, MD;  Location: [facility];  Service: Open Heart Surgery;  Laterality: N/A;  . LEFT HEART CATH AND CORONARY ANGIOGRAPHY N/A 11/24/2018   Procedure:  LEFT HEART CATH AND CORONARY ANGIOGRAPHY;  Surgeon: Belva Crome, MD;  Location: [facility];  Service: Cardiovascular;  Laterality: N/A;  . MITRAL VALVE REPAIR N/A 11/30/2018   Procedure: MITRAL VALVE REPLACEMENT (MVR) USING MAGNA EASE SIZE 37m;  Surgeon: AWonda Olds MD;  [facility]  Service: Open Heart Surgery;  Laterality: N/A;  . PLACEMENT OF IMPELLA LEFT VENTRICULAR ASSIST DEVICE N/A 11/30/2018   Procedure: PLACEMENT OF IMPELLA LEFT VENTRICULAR ASSIST DEVICE;  Surgeon: AWonda Olds MD;  [place] [facility]  Service: Open Heart Surgery;  Laterality: N/A;  . REMOVAL OF IMPELLA LEFT VENTRICULAR ASSIST DEVICE N/A 12/04/2018   Procedure: REMOVAL OF IMPELLA LEFT VENTRICULAR ASSIST DEVICE.  EVACUATION OF LEFT HEMOTHORAX;  Surgeon: AWonda Olds MD;  [place] [facility]  Service: Open Heart Surgery;  Laterality: N/A;  . STERNAL INCISION RECLOSURE  12/04/2018   Procedure: Sternal Plating with Sternal Rewiring;  Surgeon: AWonda Olds MD;  [facility];  Service: Open Heart Surgery;;  . TEE WITHOUT CARDIOVERSION N/A 11/26/2018   Procedure: TRANSESOPHAGEAL ECHOCARDIOGRAM (TEE);  Surgeon: SJerline Pain MD;  [facility];  Service: Cardiovascular;  Laterality: N/A;  . TEE WITHOUT CARDIOVERSION N/A 11/30/2018   Procedure: TRANSESOPHAGEAL ECHOCARDIOGRAM (TEE);  Surgeon: AWonda Olds MD;  [place] [facility]  Service: Open Heart Surgery;  Laterality: N/A;  . TEE WITHOUT CARDIOVERSION N/A 12/04/2018   Procedure: TRANSESOPHAGEAL ECHOCARDIOGRAM (TEE);  Surgeon: AWonda Olds MD;  [place]: [facility]  Service: Open Heart Surgery;  Laterality: N/A;  Assessment & Plan Clinical Impression: Curtis [name] is a 68 year old right-handed male with history of tobacco abuse on no prescription medications. History taken from chart review and patient due to delayed processing. Per chart review he lives with elderly parent. He is retired. Independent prior to admission to level home  with 3 steps to entry. 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 Presented 11/24/2018 with worsening SOB, tachycardia, and tachypnea. Oxygen saturations 60% on room air. There were reports of multiple syncopal episodes in route but patient never lost pulses. He did receive epinephrine 0.3 mg as well as Solu-Medrol. Patient placed on BiPAP, high-sensitivity troponin 63. ECG showed narrow complex tachycardia. BUN 16, creatinine 1.4, COVID negative. Chest x-ray showed mild cardiomegaly and moderate pulmonary edema. Echocardiogram with EF ~25%. Underwent cardiac catheterization per Dr. Daneen Schick showing severe multivessel CAD with 40% tubular left main. 60 to 70% proximal LAD tandem lesions. Severe 80% diffuse disease in the large first diagonal. Underwent CABG x3 as well as mitral valve replacement 11/30/2018 per Dr. Orvan Seen of [facility]. Postoperative cardiogenic shock with mediastinal bleeding and patient returned emergently to the OR and underwent removal of Impella left ventricular assistive device evacuation of left hemothorax 12/04/2018. Patient was extubated 12/05/2018. CT negative for pulmonary emboli or significant effusion or infiltrate. Hospital course further complicated by pain and ABLA, 10.1 as well as leukocytosis 17,300 improved to 15,900 and patient had been maintained on empiric vancomycin and cefepime. Urine culture and blood cultures no growth. Subcutaneous Lovenox for DVT prophylaxis. Patient is currently maintained also on aspirin 325 mg daily after CABG. Patient had initially been placed on milrinone transition to amiodarone 200 mg twice daily. Findings of mildly elevated hemoglobin A1c 5.8 with sliding scale insulin and low-dose Levemir added. Patient is tolerating a regular consistency diet. There have been intermittent bouts of confusion that is steadily improve postoperatively with therapies initiated and sternal precautions as directed. Therapy evaluations completed and patient  was admitted for a comprehensive rehab program. Please see preadmission assessment from earlier today as well as well as acute H&P filed under attestation due to technical error.    Patient transferred to CIR on 12/11/2018 .    Patient currently requires mod with basic self-care skills secondary to muscle weakness, decreased cardiorespiratoy endurance, decreased visual acuity, decreased awareness, decreased problem solving, decreased safety awareness, decreased memory and delayed processing and decreased sitting balance, decreased standing balance, decreased postural control and decreased balance strategies.  Prior to hospitalization, patient could complete ADLs  with independent .  Patient will benefit from skilled intervention to increase independence with basic self-care skills prior to discharge home with care partner.  Anticipate patient will require 24 hour supervision and follow up home health.  OT - End of Session Activity Tolerance: Tolerates 10 - 20 min activity with multiple rests Endurance Deficit: Yes OT Assessment Rehab Potential (ACUTE ONLY): Good OT Barriers to Discharge: Decreased caregiver support OT Barriers to Discharge Comments: Pt's friend plans to hire caregivers, but pt may need 24/7. OT Patient demonstrates impairments in the following area(s): Balance;Cognition;Endurance;Edema;Motor;Safety;Vision OT Basic ADL's Functional Problem(s): Bathing;Dressing;Toileting OT Transfers Functional Problem(s): Toilet;Tub/Shower OT Additional Impairment(s): None OT Plan OT Intensity: Minimum of 1-2 x/day, 45 to 90 minutes OT Frequency: 5 out of 7 days OT Duration/Estimated Length of Stay: 14-16 days OT Treatment/Interventions: Balance/vestibular training;Cognitive remediation/compensation;Discharge planning;Functional mobility training;DME/adaptive equipment instruction;Neuromuscular re-education;Patient/family education;Psychosocial support;Therapeutic Activities;Self Care/advanced  ADL retraining;Therapeutic Exercise;UE/LE Strength taining/ROM;UE/LE Coordination activities;Visual/perceptual remediation/compensation OT Self Feeding Anticipated Outcome(s): Independent OT Basic  Self-Care Anticipated Outcome(s): Supervision OT Toileting Anticipated Outcome(s): Supervision OT Bathroom Transfers Anticipated Outcome(s): Supervision OT Recommendation Patient destination: Home Follow Up Recommendations: Home health OT Equipment Recommended: 3 in 1 bedside comode;Tub/shower bench   Skilled Therapeutic Intervention Pt seen for initial evaluation and ADL training with a focus on safe mobility with his sternal precautions. Reviewed sternal precautions, role of OT,, pt's goals.  Pt was able to answer questions and knew where he was and what happened to him but had slow processing.  Pt was able to sit to EOB with min A and then stand up with mod A.  He was fairly unsteady and needed mod A to stabilize his balance to transfer to w/c.  Bathed and dressed at sink and completed toileting. At end of session, sat in w/c with belt alarm on and all needs met.      OT Evaluation Precautions/Restrictions  Precautions Precautions: Fall;Sternal Restrictions Other Position/Activity Restrictions: sternal precautions   Pain Pain Assessment Pain Score: 0-No pain Home Living/Prior Functioning Home Living Family/patient expects to be discharged to:: Private residence Living Arrangements: Parent Available Help at Discharge: Friend(s) Type of Home: House Home Access: Stairs to enter Technical brewer of Steps: 2-3 Home Layout: 1/2 bath on main level, Bed/bath upstairs Alternate Level Stairs-Number of Steps: Flight Alternate Level Stairs-Rails: Right Bathroom Shower/Tub: Chiropodist: Standard  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) Prior Function Vocation: Retired ADL ADL Eating: Set up Grooming: Setup Where Assessed-Grooming: Sitting at  sink Upper Body Bathing: Setup Where Assessed-Upper Body Bathing: Sitting at sink Lower Body Bathing: Moderate assistance Where Assessed-Lower Body Bathing: Sitting at sink, Standing at sink Upper Body Dressing: Setup Where Assessed-Upper Body Dressing: Wheelchair Lower Body Dressing: Moderate assistance Where Assessed-Lower Body Dressing: Wheelchair Toileting: Moderate assistance Where Assessed-Toileting: Glass blower/designer: Moderate assistance Toilet Transfer Method: Stand pivot Science writer: Grab bars Vision Baseline Vision/History: Cataracts(very impaired acuity) Patient Visual Report: No change from baseline Additional Comments: Pt is basically blind, he states he can see my hair and that I have goggles on but cannot see any facial features.  in bathroom he needed to use hands to feel outline of toilet seat as he could not see the seat. Perception  Perception: Within Functional Limits(intact for body scheme and L/R side awareness) Praxis Praxis: Intact Cognition Overall Cognitive Status: No family/caregiver present to determine baseline cognitive functioning Arousal/Alertness: Awake/alert Orientation Level: Person;Place;Situation Person: Oriented Place: Oriented Situation: Oriented Year: 2020 Month: October Day of Week: Correct Memory: Impaired Memory Impairment: Decreased recall of new information(unable to recall sternal precautions and unable to recall that neighbors are staying with pt's mother) Immediate Memory Recall: Sock;Blue;Bed Memory Recall Sock: Without Cue Memory Recall Blue: Without Cue Memory Recall Bed: Without Cue Attention: Selective Selective Attention: Impaired Selective Attention Impairment: Verbal basic;Functional basic Awareness: Impaired Awareness Impairment: Intellectual impairment Problem Solving: Impaired Problem Solving Impairment: Verbal complex;Functional complex Executive Function: (all areas impacted by  deficits) Behaviors: Restless;Poor frustration tolerance Safety/Judgment: Impaired Comments: unable to recall and follow sternal precautions Sensation Sensation Light Touch: Appears Intact Hot/Cold: Appears Intact Proprioception: Appears Intact Stereognosis: Appears Intact Coordination Gross Motor Movements are Fluid and Coordinated: No Fine Motor Movements are Fluid and Coordinated: Yes Coordination and Movement Description: generalized motor weakness in LEs,  UEs limited by sternal precautions Motor  Motor Motor - Skilled Clinical Observations: generalized weakness Mobility    mod A to stand and stand pivot Trunk/Postural Assessment  Cervical Assessment Cervical Assessment: Within Functional Limits  Thoracic Assessment Thoracic Assessment: Within Functional Limits Lumbar Assessment Lumbar Assessment: Within Functional Limits Postural Control Postural Control: Deficits on evaluation(mild ataxia in trunk with standing)  Balance Static Sitting Balance Static Sitting - Level of Assistance: 5: Stand by assistance Dynamic Sitting Balance Dynamic Sitting - Level of Assistance: 4: Min assist Static Standing Balance Static Standing - Level of Assistance: 4: Min assist Dynamic Standing Balance Dynamic Standing - Level of Assistance: 3: Mod assist Extremity/Trunk Assessment RUE Assessment General Strength Comments: distally 4/5, Shoulder not assessed due to sternal precautions LUE Assessment General Strength Comments: distally 4/5, Shoulder not assessed due to sternal precautions     Refer to Care Plan for Long Term Goals  Recommendations for other services: None    Discharge Criteria: Patient will be discharged from OT if patient refuses treatment 3 consecutive times without medical reason, if treatment goals not met, if there is a change in medical status, if patient makes no progress towards goals or if patient is discharged from hospital.  The above assessment, treatment  plan, treatment alternatives and goals were discussed and mutually agreed upon: by patient  [place] 12/12/2018, 11:54 AM

## 2018-12-12 NOTE — Progress Notes (Signed)
Patient's bp 88/57, HR 88; manual bp 98/60; patient claims he;s alright just tired; fluids offered and encouraged; Dr. Letta Pate notified. No new orders at this time.

## 2018-12-13 NOTE — Progress Notes (Signed)
[place] PHYSICAL MEDICINE & REHABILITATION PROGRESS NOTE   Subjective/Complaints:  Pt tired but no other c/os  ROS- neg CP, SOB,  N/V/D  Objective:   No results found. Recent Labs    12/11/18 0330  WBC 15.9*  HGB 10.8*  HCT 34.4*  PLT 362   Recent Labs    12/11/18 0330  NA 135  K 3.9  CL 102  CO2 22  GLUCOSE 118*  BUN 24*  CREATININE 1.24  CALCIUM 7.9*    Intake/Output Summary (Last 24 hours) at 12/13/2018 0939 Last data filed at 12/13/2018 0900 Gross per 24 hour  Intake 360 ml  Output -  Net 360 ml     Physical Exam: Vital Signs Blood pressure 117/61, pulse 93, temperature 98 F (36.7 C), temperature source Oral, resp. rate 16, height 5\' 8"  (1.727 m), weight 87 kg, SpO2 98 %.  General: No acute distress Mood and affect are appropriate Heart: Regular rate and rhythm no rubs murmurs or extra sounds Lungs: Clear to auscultation, breathing unlabored, no rales or wheezes Abdomen: Positive bowel sounds, soft nontender to palpation, nondistended Extremities: No clubbing, cyanosis, or edema Skin: Sternotomy incision CDI  Neurologic: Cranial nerves II through XII intact, motor strength is 5/5 in bilateral deltoid, bicep, tricep, grip, hip flexor, knee extensors, ankle dorsiflexor and plantar flexor   Musculoskeletal: Full range of motion in all 4 extremities. No joint swelling    Assessment/Plan: 1. Functional deficits secondary to Debility following CABG  which require 3+ hours per day of interdisciplinary therapy in a comprehensive inpatient rehab setting.  Physiatrist is providing close team supervision and 24 hour management of active medical problems listed below.  Physiatrist and rehab team continue to assess barriers to discharge/monitor patient progress toward functional and medical goals  Care Tool:  Bathing              Bathing assist       Upper Body Dressing/Undressing Upper body dressing   What is the patient wearing?: Hospital  gown only    Upper body assist Assist Level: Maximal Assistance - Patient 25 - 49%    Lower Body Dressing/Undressing Lower body dressing      What is the patient wearing?: Incontinence brief     Lower body assist Assist for lower body dressing: Total Assistance - Patient < 25%     Toileting Toileting    Toileting assist Assist for toileting: Total Assistance - Patient < 25%     Transfers Chair/bed transfer  Transfers assist     Chair/bed transfer assist level: Minimal Assistance - Patient > 75%     Locomotion Ambulation   Ambulation assist      Assist level: Moderate Assistance - Patient 50 - 74% Assistive device: Hand held assist Max distance: 55ft   Walk 10 feet activity   Assist     Assist level: Moderate Assistance - Patient - 50 - 74% Assistive device: Hand held assist   Walk 50 feet activity   Assist    Assist level: Moderate Assistance - Patient - 50 - 74% Assistive device: No Device    Walk 150 feet activity   Assist Walk 150 feet activity did not occur: Safety/medical concerns         Walk 10 feet on uneven surface  activity   Assist     Assist level: Minimal Assistance - Patient > 75% Assistive device: Hand held assist   Wheelchair     Assist Will patient use wheelchair at  discharge?: (TBD but anticipate pt will be functional ambulator)             Wheelchair 50 feet with 2 turns activity    Assist            Wheelchair 150 feet activity     Assist          Blood pressure 117/61, pulse 93, temperature 98 F (36.7 C), temperature source Oral, resp. rate 16, height 5\' 8"  (1.727 m), weight 87 kg, SpO2 98 %.    Medical Problem List and Plan:  1. Debility secondary to CAD with non-STEMI status post CABG with MVR 11/30/2018 with postoperative mediastinal bleeding/cardiogenic shock/acute respiratory failure with removal of Impella left ventricular assistive device evacuation of left hemothorax  12/04/2018. Sternal precautions  CIR PT, OT  2. Antithrombotics:  -DVT/anticoagulation: Lovenox. Monitor for any bleeding episodes  -antiplatelet therapy: Aspirin 325 mg daily  3. Pain Management: Tramadol as needed  4. Mood: Provide emotional support  -antipsychotic agents: N/A  5. Neuropsych: This patient is? Fully capable of making decisions on his own behalf.  6. Skin/Wound Care: Routine skin checks  7. Fluids/Electrolytes/Nutrition: Routine in and outs. CMP ordered  8. Acute blood loss anemia. CBC ordered  9. Hypertension. Entresto 24-26 mg twice daily, Aldactone 25 mg daily, Demadex 20 mg daily reduced to 10mg   Due to soft BP and low po fluid intake . Monitor with increased mobility  Vitals:   12/13/18 0523 12/13/18 0800  BP: (!) 112/56 117/61  Pulse: 86 93  Resp: 16   Temp: 98 F (36.7 C)   SpO2: 97% 98%  Controlled 10/11 10. PAF. Amiodarone 200 mg daily. Cardiac rate controlled. Monitor with increased physical exertion.  11. Leukocytosis. recurrent. Urine culture blood cultures no growth to date. Empiric vancomycin and cefepime discontinued. Repeat CBC ordered.  12. Prediabetes. Levemir 5 units every 12 hours. Consider transition to oral agent if possible. Diabetic teaching. Monitor with increased mobility.  CBG (last 3)  Recent Labs    12/10/18 2344 12/11/18 0326 12/11/18 1629  GLUCAP 81 108* 130*   13. Combined CHF  EF of 25-30%  Daily weights , intake poor and BP soft will reduce demadex     LOS: 2 days A FACE TO FACE EVALUATION WAS PERFORMED  02/10/19 12/13/2018, 9:39 AM

## 2018-12-14 ENCOUNTER — Inpatient Hospital Stay (HOSPITAL_COMMUNITY): Payer: Medicare Other

## 2018-12-14 ENCOUNTER — Inpatient Hospital Stay (HOSPITAL_COMMUNITY): Payer: Medicare Other | Admitting: Speech Pathology

## 2018-12-14 ENCOUNTER — Inpatient Hospital Stay (HOSPITAL_COMMUNITY): Payer: Medicare Other | Admitting: Occupational Therapy

## 2018-12-14 DIAGNOSIS — D72823 Leukemoid reaction: Secondary | ICD-10-CM

## 2018-12-14 DIAGNOSIS — I5042 Chronic combined systolic (congestive) and diastolic (congestive) heart failure: Secondary | ICD-10-CM

## 2018-12-14 LAB — CBC WITH DIFFERENTIAL/PLATELET
Abs Immature Granulocytes: 0.3 10*3/uL — ABNORMAL HIGH (ref 0.00–0.07)
Basophils Absolute: 0.1 10*3/uL (ref 0.0–0.1)
Basophils Relative: 1 %
Eosinophils Absolute: 0.2 10*3/uL (ref 0.0–0.5)
Eosinophils Relative: 1 %
HCT: 31.6 % — ABNORMAL LOW (ref 39.0–52.0)
Hemoglobin: 9.8 g/dL — ABNORMAL LOW (ref 13.0–17.0)
Immature Granulocytes: 2 %
Lymphocytes Relative: 9 %
Lymphs Abs: 1.5 10*3/uL (ref 0.7–4.0)
MCH: 30.2 pg (ref 26.0–34.0)
MCHC: 31 g/dL (ref 30.0–36.0)
MCV: 97.5 fL (ref 80.0–100.0)
Monocytes Absolute: 1.6 10*3/uL — ABNORMAL HIGH (ref 0.1–1.0)
Monocytes Relative: 10 %
Neutro Abs: 12.3 10*3/uL — ABNORMAL HIGH (ref 1.7–7.7)
Neutrophils Relative %: 77 %
Platelets: 402 10*3/uL — ABNORMAL HIGH (ref 150–400)
RBC: 3.24 MIL/uL — ABNORMAL LOW (ref 4.22–5.81)
RDW: 19.2 % — ABNORMAL HIGH (ref 11.5–15.5)
WBC: 16.1 10*3/uL — ABNORMAL HIGH (ref 4.0–10.5)
nRBC: 0 % (ref 0.0–0.2)

## 2018-12-14 LAB — COMPREHENSIVE METABOLIC PANEL
ALT: 35 U/L (ref 0–44)
AST: 26 U/L (ref 15–41)
Albumin: 2.4 g/dL — ABNORMAL LOW (ref 3.5–5.0)
Alkaline Phosphatase: 64 U/L (ref 38–126)
Anion gap: 11 (ref 5–15)
BUN: 34 mg/dL — ABNORMAL HIGH (ref 8–23)
CO2: 23 mmol/L (ref 22–32)
Calcium: 7.9 mg/dL — ABNORMAL LOW (ref 8.9–10.3)
Chloride: 101 mmol/L (ref 98–111)
Creatinine, Ser: 1.65 mg/dL — ABNORMAL HIGH (ref 0.61–1.24)
GFR calc Af Amer: 50 mL/min — ABNORMAL LOW (ref >60)
GFR calc non Af Amer: 43 mL/min — ABNORMAL LOW (ref >60)
Glucose, Bld: 133 mg/dL — ABNORMAL HIGH (ref 70–99)
Potassium: 3.9 mmol/L (ref 3.5–5.1)
Sodium: 135 mmol/L (ref 135–145)
Total Bilirubin: 1.4 mg/dL — ABNORMAL HIGH (ref 0.3–1.2)
Total Protein: 5.6 g/dL — ABNORMAL LOW (ref 6.5–8.1)

## 2018-12-14 MED ORDER — TORSEMIDE 10 MG PO TABS: 10.0000 mg | ORAL_TABLET | Freq: Every day | ORAL | Status: DC

## 2018-12-14 NOTE — Progress Notes (Addendum)
Physical Therapy Session Note  Patient Details  Name: Curtis [name] MRN: [id number] Date of Birth: 01-Jan-1951  Today's Date: 12/14/2018 PT Individual Time: 1030-1115 PT Individual Time Calculation (min): 45 min   Short Term Goals: Week 1:  PT Short Term Goal 1 (Week 1): Pt will perform supine<>sit with CGA PT Short Term Goal 2 (Week 1): Pt will perform bed<>chair transfers wiht CGA PT Short Term Goal 3 (Week 1): Pt will ambulate at least 29ft using LRAD with CGA PT Short Term Goal 4 (Week 1): Pt will ascend/descend 8 steps using B HRs with CGA  Skilled Therapeutic Interventions/Progress Updates:   Pt asleep in bed.  He awakened to voice and TCs.  He denied pain.  PTdonned TEDs and anti slip socks with pt in supine.  Pt hypersensitive to touch, dorsum of L foot, at MTPs.  No edema or erythema noted.  Pt donned shorts in supine, bridging up, supervision, after PT threaded shorts over feet.  Stand pivot to w/c with min HHA.  Attempted gait training in hallway but pt unable to stand up due to L foot pain.  With railing in front of him, pt came up to standing but immediately stated that he needed to use toilet.  Toilet transfer using wall bar, min assist.  Pt continent of B and B.  Hand washing at sink from w/c level, set up.  Seated Therapeutic exercise performed with LE to increase strength for functional mobility: 15 x 1 bil ankle DF, R/L hip flexion.  At end of session, pt resting in bed with needs at hand and bed alarm set.  TEDs doffed.       Therapy Documentation Precautions:  Precautions Precautions: Fall, Sternal Restrictions Weight Bearing Restrictions: No Other Position/Activity Restrictions: sternal precautions   Pain: 6/10 L dorsum of foot, at MTPs, with wt bearing or mvt.  RN informed.         Therapy/Group: Individual Therapy  Cara Aguino 12/14/2018, 12:16 PM

## 2018-12-14 NOTE — Progress Notes (Signed)
Occupational Therapy Session Note  Patient Details  Name: Curtis [name] MRN: [id number] Date of Birth: 1950-03-27  Today's Date: 12/14/2018 OT Individual Time: 1410-1520 OT Individual Time Calculation (min): 70 min    Short Term Goals: Week 1:  OT Short Term Goal 1 (Week 1): Pt will be able to stand up following sternal precautions with CGA. OT Short Term Goal 2 (Week 1): Pt will be able to transfer to toilet following sternal precautions with CGA. OT Short Term Goal 3 (Week 1): Pt will bathe LB with CGA. OT Short Term Goal 4 (Week 1): Pt will dress LB with CGA.  Skilled Therapeutic Interventions/Progress Updates:    Patient supine in bed, alert and cooperative.  He is aware of current circumstances, requires cues for date, able to follow directions.  teds and slipper socks donned dependent.  Supine to side lying with CS, side lying to SSP with min A, cues for sternal precautions.  Sit to stand and SPT to/from bed, toilet, w/c with min a and ongoing cues for hand placement/sternal precautions.  toileting max A for pants up/down and hygiene, brief soiled and max A to change.  Completed light towel slides on low table and LB marches/kicks/ankle.  Standing tolerance limited this session by left foot discomfort at toes 2,3,4 - no redness or edema noted.  Completed self stretch with fair tolerance.  Patient returned to bed at close of session min A (improved recall of hand placement) bed alarm set and call bell in reach.    Therapy Documentation Precautions:  Precautions Precautions: Fall, Sternal Restrictions Weight Bearing Restrictions: No Other Position/Activity Restrictions: sternal precautions General:   Vital Signs: Therapy Vitals Temp: 97.6 F (36.4 C) Pulse Rate: 95 Resp: 17 BP: (!) 101/56 Patient Position (if appropriate): Lying Oxygen Therapy SpO2: 98 % O2 Device: Room Air Pain: Pain Assessment Pain Scale: 0-10 Pain Score: 7  Pain Location: Foot Pain Orientation:  Left Pain Descriptors / Indicators: Pressure Pain Intervention(s): Repositioned Other Treatments:     Therapy/Group: Individual Therapy  [name] 12/14/2018, 4:03 PM

## 2018-12-14 NOTE — Progress Notes (Signed)
Social Work Assessment and Plan   Patient Details  Name: Curtis [name] MRN: [id number] Date of Birth: 11-Oct-1950  Today's Date: 12/14/2018  Problem List:  Patient Active Problem List   Diagnosis Date Noted  . Debility 12/11/2018  . S/P MVR (mitral valve replacement)   . Prediabetes   . PAF (paroxysmal atrial fibrillation) ([place])   . Acute blood loss anemia   . Chest tube in place   . S/P CABG x 3 11/30/2018  . Coronary artery disease involving native heart without angina pectoris   . Acute on chronic combined systolic and diastolic CHF (congestive heart failure) ([place])   . Acute respiratory failure with hypoxia and hypercapnia ([place]) 11/24/2018  . Pulmonary edema 11/24/2018  . Hypertensive emergency 11/24/2018  . Elevated troponin 11/24/2018  . Leukocytosis 11/24/2018  . NSTEMI (non-ST elevated myocardial infarction) ([place])   . Flash pulmonary edema ([place])   . Hypertension    Past Medical History:  Past Medical History:  Diagnosis Date  . Coronary artery disease   . Elevated troponin 11/25/2018  . Tobacco use    Past Surgical History:  Past Surgical History:  Procedure Laterality Date  . CORONARY ARTERY BYPASS GRAFT N/A 11/30/2018   Procedure: MEDIASTINAL POST-OP BLEED;  Surgeon: Wonda Olds, MD;  Location: [place];  Service: Open Heart Surgery;  Laterality: N/A;  . CORONARY ARTERY BYPASS GRAFT N/A 11/30/2018   Procedure: CORONARY ARTERY BYPASS GRAFTING (CABG) x Three , using left internal mammary artery and left leg greater saphenous vein harvested endoscopically;  Surgeon: Wonda Olds, MD;  Location: [place];  Service: Open Heart Surgery;  Laterality: N/A;  . LEFT HEART CATH AND CORONARY ANGIOGRAPHY N/A 11/24/2018   Procedure: LEFT HEART CATH AND CORONARY ANGIOGRAPHY;  Surgeon: Belva Crome, MD;  Location: [place] [facility];  Service: Cardiovascular;  Laterality: N/A;  . MITRAL VALVE REPAIR N/A 11/30/2018   Procedure: MITRAL VALVE REPLACEMENT (MVR) USING MAGNA EASE SIZE  48mm;  Surgeon: Wonda Olds, MD;  Location: [place];  Service: Open Heart Surgery;  Laterality: N/A;  . PLACEMENT OF IMPELLA LEFT VENTRICULAR ASSIST DEVICE N/A 11/30/2018   Procedure: PLACEMENT OF IMPELLA LEFT VENTRICULAR ASSIST DEVICE;  Surgeon: Wonda Olds, MD;  Location: [place];  Service: Open Heart Surgery;  Laterality: N/A;  . REMOVAL OF IMPELLA LEFT VENTRICULAR ASSIST DEVICE N/A 12/04/2018   Procedure: REMOVAL OF IMPELLA LEFT VENTRICULAR ASSIST DEVICE.  EVACUATION OF LEFT HEMOTHORAX;  Surgeon: Wonda Olds, MD;  Location: [place];  Service: Open Heart Surgery;  Laterality: N/A;  . STERNAL INCISION RECLOSURE  12/04/2018   Procedure: Sternal Plating with Sternal Rewiring;  Surgeon: Wonda Olds, MD;  Location: [facility];  Service: Open Heart Surgery;;  . TEE WITHOUT CARDIOVERSION N/A 11/26/2018   Procedure: TRANSESOPHAGEAL ECHOCARDIOGRAM (TEE);  Surgeon: Jerline Pain, MD;  Location: [facility];  Service: Cardiovascular;  Laterality: N/A;  . TEE WITHOUT CARDIOVERSION N/A 11/30/2018   Procedure: TRANSESOPHAGEAL ECHOCARDIOGRAM (TEE);  Surgeon: Wonda Olds, MD;  Location: [place];  Service: Open Heart Surgery;  Laterality: N/A;  . TEE WITHOUT CARDIOVERSION N/A 12/04/2018   Procedure: TRANSESOPHAGEAL ECHOCARDIOGRAM (TEE);  Surgeon: Wonda Olds, MD;  Location: [place];  Service: Open Heart Surgery;  Laterality: N/A;   Social History:  reports that he has been smoking cigarettes. He has been smoking about 0.25 packs per day. He has never used smokeless tobacco. He reports that he does not drink alcohol or use drugs.  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) Other Supports: friend, Windell Moulding @ [phone];  friend, Forestine Chute @ [phone] Anticipated Caregiver: neighbors and hired caregivers as recommended Ability/Limitations of Caregiver: Minerva Areola works; he will arrange caregivers as recommended  by  [employer] Caregiver Availability: Other (Comment)(Eric working to arrange for level recommended by [employer]) Family Dynamics: Pt and friend, Minerva Areola, report that pt's mother is "mild dementia...doesn't really need much help...just medications..."  Social History Preferred language: English Religion: None Cultural Background: NA Read: Yes Write: Yes Employment Status: Retired Marine scientist Issues: None Guardian/Conservator: None - per MD, pt is "?fully capable" of making decisions on his own behalf.   Abuse/Neglect Abuse/Neglect Assessment Can Be Completed: Yes Physical Abuse: Denies Verbal Abuse: Denies Sexual Abuse: Denies Exploitation of patient/patient's resources: Denies Self-Neglect: Denies  Emotional Status Pt's affect, behavior and adjustment status: Pt lying in bed and agreeable to assessment interview, however, affect is flat and answers are very short and minimal eye contact.  Appears almost annoyed with the questions.  He does complete the intake without any difficulty, however, difficult to engage overall.  Have referred for neuropsychology to further address/ eval mood. Recent Psychosocial Issues: None Psychiatric History: None Substance Abuse History: none  Patient / Family Perceptions, Expectations & Goals Pt/Family understanding of illness & functional limitations: Pt aware of his CABG and MVR surgery and current functional limitations/ need for CIR.  He is hopeful that his stay will be very short. Premorbid pt/family roles/activities: Pt was independent overall and providing supervision/ oversight to elderly mother. Home management, med management and financial. Anticipated changes in roles/activities/participation: Pt and mother will likely need some additional support in the home at least initially. Pt/family expectations/goals: "I just want to get home."  [employer]: None Premorbid Home Care/DME Agencies: None Transportation  available at discharge: yes Resource referrals recommended: Neuropsychology  Discharge Planning Living Arrangements: Parent Support Systems: Parent, Friends/neighbors Type of Residence: Private residence Insurance Resources: Electrical engineer Resources: Restaurant manager, fast food Screen Referred: No Living Expenses: Lives with family Money Management: Patient Does the patient have any problems obtaining your medications?: No Home Management: mostly pt PTA Patient/Family Preliminary Plans: Pt to return home with elderly mother.  Friends to coordinate some support in the home for them both at least initially. Expected length of stay: 14-16 days  Clinical Impression Unfortunate gentleman here following CABG and MVR surgery.  Very deconditioned and team anticipates LOS ~ 2 weeks.  Pt not happy with this and is eager to get home much sooner.  Difficult to engage in conversation overall but is able to complete assessment interview.  Pt denies any significant emotional distress, however, have referred to neuropsychology to evaluate mood further.  Friend/ neighbor is assisting with coordination of some support for pt and his elderly mother who is also in the home.  Will follow for support and d/c planning needs.  Calani Gick 12/14/2018, 3:30 PM

## 2018-12-14 NOTE — Progress Notes (Signed)
Speech Language Pathology Daily Session Note  Patient Details  Name: Curtis [name] MRN: [id number] Date of Birth: 1950/05/09  Today's Date: 12/14/2018 SLP Individual Time: 0725-0820 SLP Individual Time Calculation (min): 55 min  Short Term Goals: Week 1: SLP Short Term Goal 1 (Week 1): Given Min A cues, pt will recall sternal precautions in 8 out of 10 opportunities. SLP Short Term Goal 2 (Week 1): Given Min A cues, pt will utilize sternal precautions in 8 out of 10 opportunities. SLP Short Term Goal 3 (Week 1): Pt will demosntrate selective attention for 30 minutes with Min A cues. SLP Short Term Goal 4 (Week 1): Pt will demonstrate intellectual awareness by identifying 3 physical and cognitive deficits with Min A cues. SLP Short Term Goal 5 (Week 1): Pt will ocmplete semi-complex problem solving tasks with Min A cues. SLP Short Term Goal 6 (Week 1): Pt will utilize compensatory memory strategies to recall information with Min A cues in 8 out of 10 opportunities.  Skilled Therapeutic Interventions: Skilled treatment session focused on cognitive goals. SLP facilitated session by providing tray set-up and overall Min A verbal cues for patient to locate specific food items for self-feeding. SLP also administered the MoCA-BLIND due to visual deficits in which patient scored 16/20 points with a score of 19 or above considered normal. Patient demonstrates deficits in short-term recall and reasoning. Patient disoriented to time of day and thought that it was ~1300pm. Patient reoriented. Patient left upright in bed with alarm on and all needs within reach. Continue with current plan of care.      Pain No/Denies Pain   Therapy/Group: Individual Therapy  Starr Engel 12/14/2018, 8:21 AM

## 2018-12-14 NOTE — IPOC Note (Signed)
Overall Plan of Care [facility]) Patient Details Name: Curtis [name] MRN: [id number] DOB: [birth year]  Admitting Diagnosis: [facility] Problems: Principal Problem:   Debility     Functional Problem List: Nursing Bladder, Endurance, Motor, Safety, Skin Integrity  PT Balance, Perception, Safety, Behavior, Edema, Endurance, Motor, Pain  OT Balance, Cognition, Endurance, Edema, Motor, Safety, Vision  SLP Cognition, Endurance, Perception, Safety  TR         Basic ADL's: OT Bathing, Dressing, Toileting     Advanced  ADL's: OT       Transfers: PT Bed Mobility, Bed to Chair, Car, Occupational psychologist, Research scientist (life sciences): PT Ambulation, Stairs     Additional Impairments: OT None  SLP Social Cognition   Problem Solving, Memory, Attention, Awareness  TR      Anticipated Outcomes Item Anticipated Outcome  Self Feeding Independent  Swallowing      Basic self-care  Supervision  Toileting  Supervision   Bathroom Transfers Supervision  Bowel/Bladder  Continent to bowel and bladder with min.assist.  Transfers  supervision  Locomotion  supervision with LRAD  Communication     Cognition  Supervision  Pain  Less than 3,on 1 to 10 scale  Safety/Judgment  Free from falls during his stay in rehab.   Therapy Plan: PT Intensity: Minimum of 1-2 x/day ,45 to 90 minutes PT Frequency: 5 out of 7 days PT Duration Estimated Length of Stay: ~2 weeks OT Intensity: Minimum of 1-2 x/day, 45 to 90 minutes OT Frequency: 5 out of 7 days OT Duration/Estimated Length of Stay: 14-16 days SLP Intensity: Minumum of 1-2 x/day, 30 to 90 minutes SLP Frequency: 3 to 5 out of 7 days SLP Duration/Estimated Length of Stay: 2 weeks   Due to the current state of emergency, patients may not be receiving their 3-hours of Medicare-mandated therapy.   Team Interventions: Nursing Interventions Patient/Family Education, Bladder Management, Skin Care/Wound Management, Disease  Management/Prevention, Pain Management  PT interventions Ambulation/gait training, Community reintegration, DME/adaptive equipment instruction, Neuromuscular re-education, Psychosocial support, Stair training, UE/LE Strength taining/ROM, Warden/ranger, Discharge planning, Functional electrical stimulation, Pain management, Skin care/wound management, Therapeutic Activities, UE/LE Coordination activities, Cognitive remediation/compensation, Disease management/prevention, Functional mobility training, Patient/family education, Therapeutic Exercise, Splinting/orthotics, Visual/perceptual remediation/compensation  OT Interventions Balance/vestibular training, Cognitive remediation/compensation, Discharge planning, Functional mobility training, DME/adaptive equipment instruction, Neuromuscular re-education, Patient/family education, Psychosocial support, Therapeutic Activities, Self Care/advanced ADL retraining, Therapeutic Exercise, UE/LE Strength taining/ROM, UE/LE Coordination activities, Visual/perceptual remediation/compensation  SLP Interventions Cognitive remediation/compensation, Internal/external aids, Patient/family education, Therapeutic Activities, Functional tasks  TR Interventions    SW/CM Interventions Discharge Planning, Psychosocial Support, Patient/Family Education   Barriers to Discharge MD  Medical stability  Nursing      PT Decreased caregiver support, Inaccessible home environment, Home environment access/layout, Lack of/limited family support, Behavior    OT Decreased caregiver support Pt's friend plans to hire caregivers, but pt may need 24/7.  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  SW       Team Discharge Planning: Destination: PT-Home ,OT- Home , SLP-Home Projected Follow-up: PT-Home health PT, 24 hour supervision/assistance, OT-  Home health OT, SLP-24 hour supervision/assistance, Home Health SLP Projected  Equipment Needs: PT-To be determined, Other (comment), OT- 3 in 1 bedside comode, Tub/shower bench, SLP-None recommended by SLP Equipment Details: PT-pt has none, OT-  Patient/family involved in discharge planning: PT- Patient,  OT-Patient, SLP-Patient  MD ELOS: 13-15 days Medical Rehab Prognosis:  Excellent Assessment:  The patient has been admitted for CIR therapies with the diagnosis of debility after CABG/LVAD removal. The team will be addressing functional mobility, strength, stamina, balance, safety, adaptive techniques and equipment, self-care, bowel and bladder mgt, patient and caregiver education, NMR, sternal precautions, community reentry, activity tolerance. Goals have been set at supervision for self-care and mobility.   Due to the current state of emergency, patients may not be receiving their 3 hours per day of Medicare-mandated therapy.    Meredith Staggers, MD, FAAPMR      See Team Conference Notes for weekly updates to the plan of care

## 2018-12-14 NOTE — Progress Notes (Signed)
Inpatient Rehabilitation  Patient information reviewed and entered into eRehab system by Makila Colombe M. Gailene Youkhana, M.A., CCC/SLP, PPS Coordinator.  Information including medical coding, functional ability and quality indicators will be reviewed and updated through discharge.    

## 2018-12-14 NOTE — Progress Notes (Signed)
[place] PHYSICAL MEDICINE & REHABILITATION PROGRESS NOTE   Subjective/Complaints:  Had a fair night. Ready for more therapy. Still feels weak  ROS: Patient denies fever, rash, sore throat, blurred vision, nausea, vomiting, diarrhea, cough, shortness of breath or chest pain, joint or back pain, headache, or mood change.    Objective:   No results found. Recent Labs    12/14/18 0405  WBC 16.1*  HGB 9.8*  HCT 31.6*  PLT 402*   Recent Labs    12/14/18 0405  NA 135  K 3.9  CL 101  CO2 23  GLUCOSE 133*  BUN 34*  CREATININE 1.65*  CALCIUM 7.9*    Intake/Output Summary (Last 24 hours) at 12/14/2018 1103 Last data filed at 12/14/2018 0930 Gross per 24 hour  Intake 680 ml  Output -  Net 680 ml     Physical Exam: Vital Signs Blood pressure (!) 108/47, pulse 90, temperature 98.3 F (36.8 C), temperature source Oral, resp. rate 16, height 5\' 8"  (1.727 m), weight 87 kg, SpO2 94 %.  Constitutional: No distress . Vital signs reviewed. HEENT: EOMI, oral membranes moist Neck: supple Cardiovascular: RRR without murmur. No JVD    Respiratory: CTA Bilaterally without wheezes or rales. Normal effort    GI: BS +, sl-tender, non-distended  Extremities: No clubbing, cyanosis, or edema Skin: Sternotomy incision CDI  With staples, staples right neck/line area, sutures epigastric area Neurologic: Cranial nerves II through XII intact, motor strength is 5/5 in bilateral deltoid, bicep, tricep, grip. 3/5 hip flexor, knee extensors. 4/5 ankle dorsiflexor and plantar flexor   Musculoskeletal: Full range of motion in all 4 extremities. No joint swelling    Assessment/Plan: 1. Functional deficits secondary to Debility following CABG  which require 3+ hours per day of interdisciplinary therapy in a comprehensive inpatient rehab setting.  Physiatrist is providing close team supervision and 24 hour management of active medical problems listed below.  Physiatrist and rehab team  continue to assess barriers to discharge/monitor patient progress toward functional and medical goals  Care Tool:  Bathing    Body parts bathed by patient: Right arm, Left arm, Chest, Abdomen, Front perineal area, Buttocks, Right upper leg, Left upper leg, Face   Body parts bathed by helper: Right lower leg, Left lower leg     Bathing assist Assist Level: Minimal Assistance - Patient > 75%     Upper Body Dressing/Undressing Upper body dressing   What is the patient wearing?: Pull over shirt    Upper body assist Assist Level: Maximal Assistance - Patient 25 - 49%    Lower Body Dressing/Undressing Lower body dressing      What is the patient wearing?: Incontinence brief     Lower body assist Assist for lower body dressing: Total Assistance - Patient < 25%     Toileting Toileting    Toileting assist Assist for toileting: Total Assistance - Patient < 25%     Transfers Chair/bed transfer  Transfers assist     Chair/bed transfer assist level: 2 Helpers     Locomotion Ambulation   Ambulation assist      Assist level: Moderate Assistance - Patient 50 - 74% Assistive device: Hand held assist Max distance: 65ft   Walk 10 feet activity   Assist     Assist level: Moderate Assistance - Patient - 50 - 74% Assistive device: Hand held assist   Walk 50 feet activity   Assist    Assist level: Moderate Assistance - Patient - 46 -  74% Assistive device: No Device    Walk 150 feet activity   Assist Walk 150 feet activity did not occur: Safety/medical concerns         Walk 10 feet on uneven surface  activity   Assist     Assist level: Minimal Assistance - Patient > 75% Assistive device: Hand held assist   Wheelchair     Assist Will patient use wheelchair at discharge?: (TBD but anticipate pt will be functional ambulator)             Wheelchair 50 feet with 2 turns activity    Assist            Wheelchair 150 feet activity      Assist          Blood pressure (!) 108/47, pulse 90, temperature 98.3 F (36.8 C), temperature source Oral, resp. rate 16, height 5\' 8"  (1.727 m), weight 87 kg, SpO2 94 %.    Medical Problem List and Plan:  1. Debility secondary to CAD with non-STEMI status post CABG with MVR 11/30/2018 with postoperative mediastinal bleeding/cardiogenic shock/acute respiratory failure with removal of Impella left ventricular assistive device evacuation of left hemothorax 12/04/2018. Sternal precautions   -continue CIR PT, OT  2. Antithrombotics:  -DVT/anticoagulation: Lovenox. Monitor for any bleeding episodes  -antiplatelet therapy: Aspirin 325 mg daily  3. Pain Management: Tramadol as needed  4. Mood: Provide emotional support  -antipsychotic agents: N/A  5. Neuropsych: This patient is? Fully capable of making decisions on his own behalf.  6. Skin/Wound Care: Routine skin checks   -remove staples from neck/chest  -continue abdominal sutures from LVAD removal 7. Fluids/Electrolytes/Nutrition: Routine in and outs.  -I personally reviewed the patient's labs today.  \   -BUN/Cr trending up--hold demadex, push fluids 8. Acute blood loss anemia. CBC ordered  9. Hypertension. Entresto 24-26 mg twice daily, Aldactone 25 mg daily, Demadex 20 mg daily reduced to 10mg ---hold 10/12.  See above #7  Vitals:   12/13/18 2018 12/14/18 0310  BP: 112/60 (!) 108/47  Pulse: 91 90  Resp:  16  Temp:  98.3 F (36.8 C)  SpO2:  94%  Controlled 10/12 10. PAF. Amiodarone 200 mg daily. Cardiac rate controlled. Monitor with increased physical exertion.  11. Leukocytosis. recurrent. Urine culture blood cultures no growth-final  - Empiric vancomycin and cefepime discontinued. Repeat CBC reveals ongoing leukocytosis 16k 10/12  -continue to monitor serially.   -afebrile, wounds clean 12. Prediabetes. Levemir 5 units every 12 hours---already stopped  -add oral agent if needed.  -need at least daily checks of  cbg's  Diabetic teaching.    CBG (last 3)  Recent Labs    12/11/18 1629  GLUCAP 130*   13. Combined CHF  EF of 25-30%   Daily weights , intake poor and BP soft will reduce demadex  Need weight today  -may need cards to follow up [facility]ights   12/11/18 1900 12/12/18 0600 12/13/18 0231  Weight: 84.6 kg 84.9 kg 87 kg       LOS: 3 days A FACE TO FACE EVALUATION WAS PERFORMED  02/11/19 12/14/2018, 11:03 AM

## 2018-12-15 ENCOUNTER — Inpatient Hospital Stay (HOSPITAL_COMMUNITY): Payer: Medicare Other | Admitting: Speech Pathology

## 2018-12-15 ENCOUNTER — Encounter (HOSPITAL_COMMUNITY): Payer: Medicare Other | Admitting: Psychology

## 2018-12-15 ENCOUNTER — Inpatient Hospital Stay (HOSPITAL_COMMUNITY): Payer: Medicare Other | Admitting: Occupational Therapy

## 2018-12-15 ENCOUNTER — Inpatient Hospital Stay (HOSPITAL_COMMUNITY): Payer: Medicare Other | Admitting: Physical Therapy

## 2018-12-15 LAB — CBC
HCT: 31.6 % — ABNORMAL LOW (ref 39.0–52.0)
Hemoglobin: 10 g/dL — ABNORMAL LOW (ref 13.0–17.0)
MCH: 30.6 pg (ref 26.0–34.0)
MCHC: 31.6 g/dL (ref 30.0–36.0)
MCV: 96.6 fL (ref 80.0–100.0)
Platelets: 398 10*3/uL (ref 150–400)
RBC: 3.27 MIL/uL — ABNORMAL LOW (ref 4.22–5.81)
RDW: 19 % — ABNORMAL HIGH (ref 11.5–15.5)
WBC: 13.5 10*3/uL — ABNORMAL HIGH (ref 4.0–10.5)
nRBC: 0 % (ref 0.0–0.2)

## 2018-12-15 LAB — BASIC METABOLIC PANEL
Anion gap: 11 (ref 5–15)
BUN: 29 mg/dL — ABNORMAL HIGH (ref 8–23)
CO2: 23 mmol/L (ref 22–32)
Calcium: 8 mg/dL — ABNORMAL LOW (ref 8.9–10.3)
Chloride: 100 mmol/L (ref 98–111)
Creatinine, Ser: 1.43 mg/dL — ABNORMAL HIGH (ref 0.61–1.24)
GFR calc Af Amer: 59 mL/min — ABNORMAL LOW (ref >60)
GFR calc non Af Amer: 51 mL/min — ABNORMAL LOW (ref >60)
Glucose, Bld: 118 mg/dL — ABNORMAL HIGH (ref 70–99)
Potassium: 3.8 mmol/L (ref 3.5–5.1)
Sodium: 134 mmol/L — ABNORMAL LOW (ref 135–145)

## 2018-12-15 LAB — GLUCOSE, CAPILLARY: Glucose-Capillary: 114 mg/dL — ABNORMAL HIGH (ref 70–99)

## 2018-12-15 MED ORDER — ASPIRIN EC 325 MG PO TBEC
325.0000 mg | DELAYED_RELEASE_TABLET | Freq: Every day | ORAL | Status: DC
  Administered 2018-12-16 – 2018-12-25 (×10): 325 mg via ORAL
  Filled 2018-12-15 (×10): qty 1

## 2018-12-15 MED ORDER — DICLOFENAC SODIUM 1 % TD GEL
2.0000 g | Freq: Three times a day (TID) | TRANSDERMAL | Status: DC
  Administered 2018-12-15 – 2018-12-24 (×26): 2 g via TOPICAL
  Filled 2018-12-15: qty 100

## 2018-12-15 MED ORDER — ASPIRIN 81 MG PO CHEW: 324.0000 mg | CHEWABLE_TABLET | Freq: Every day | ORAL | Status: DC

## 2018-12-15 NOTE — Progress Notes (Signed)
Occupational Therapy Session Note  Patient Details  Name: Curtis [name] MRN: [id number] Date of Birth: 08-15-1950  Today's Date: 12/15/2018 OT Individual Time: 1400-1443 OT Individual Time Calculation (min): 43 min  and Today's Date: 12/15/2018 OT Missed Time: 17 Minutes Missed Time Reason: Patient fatigue   Short Term Goals: Week 1:  OT Short Term Goal 1 (Week 1): Pt will be able to stand up following sternal precautions with CGA. OT Short Term Goal 2 (Week 1): Pt will be able to transfer to toilet following sternal precautions with CGA. OT Short Term Goal 3 (Week 1): Pt will bathe LB with CGA. OT Short Term Goal 4 (Week 1): Pt will dress LB with CGA.  Skilled Therapeutic Interventions/Progress Updates:    Pt greeted semi-reclined in bed as nursing entered to place Voltaren gel on L ankle. Pt reports L ankle is feeling fine. Pt declined any bathing/dressing or go to the bathroom. Pt came to sitting EOB with mod A. Pt needed 3 trials to come to standing and verbal cues for hand placement to maintain sternal precautions.   Pt then ambulated 10 feet w/ RW and min A to the wc. OT pushed wc to therapy gym, then pt completed stand-pivot to therapy mat with min A and RW. Worked on sit<>stand, standing balance, and endurance. Pt stood on foam block with RW around pt. Basketball goals placed in front of pt at chest height and pt able to see orange rim of goal. OT placed colored horse shoes on RW and had pt place horse shoes around rim in standing. Pt completed activity 4x with min A for balance and extended rest breaks in between sets 2/2 fatigue. Pt reported fatigue at 8/10 after standing bouts. Pt reported max fatigue and requested to return to room. Pt completed stand-pivot to wc with RW and CGA-verbal cues for hand placement with sit<>stand to maintain sternal precautions. Pt returned to room and ambulated 10 feet in similar fashion back to bed. Pt left semi-reclined in bed with bed alarm on and call  button in reach.   Therapy Documentation Precautions:  Precautions Precautions: Fall, Sternal Restrictions Weight Bearing Restrictions: Yes(sternal precautions) Other Position/Activity Restrictions: sternal precautions Pain:   denies pain  Therapy/Group: Individual Therapy  Valma Cava 12/15/2018, 2:25 PM

## 2018-12-15 NOTE — Progress Notes (Signed)
Speech Language Pathology Daily Session Note  Patient Details  Name: Curtis [name] MRN: [id number] Date of Birth: [birth year]  Today's Date: 12/15/2018 SLP Individual Time: 1100-1140 SLP Individual Time Calculation (min): 40 min and Today's Date: 12/15/2018 SLP Missed Time: 20 Minutes Missed Time Reason: Patient fatigue  Short Term Goals: Week 1: SLP Short Term Goal 1 (Week 1): Given Min A cues, pt will recall sternal precautions in 8 out of 10 opportunities. SLP Short Term Goal 2 (Week 1): Given Min A cues, pt will utilize sternal precautions in 8 out of 10 opportunities. SLP Short Term Goal 3 (Week 1): Pt will demosntrate selective attention for 30 minutes with Min A cues. SLP Short Term Goal 4 (Week 1): Pt will demonstrate intellectual awareness by identifying 3 physical and cognitive deficits with Min A cues. SLP Short Term Goal 5 (Week 1): Pt will ocmplete semi-complex problem solving tasks with Min A cues. SLP Short Term Goal 6 (Week 1): Pt will utilize compensatory memory strategies to recall information with Min A cues in 8 out of 10 opportunities.  Skilled Therapeutic Interventions: Skilled treatment session focused on cognitive goals. Upon arrival, patient was supine in bed and reported he was tired due to "a rough morning." SLP facilitated session by providing total A for recall of his current medications and their functions. SLP also facilitated session by providing Max A verbal cues for problem solving and anticipatory awareness in regards to how to manage medications safely at home. Patient became frustrated and appeared irritated by clinician's questions. When asked, he reported "you're asking me to think about things I am not ready to yet." Educated on importance of figuring a safe system at home for medication administration due to severe visual deficits.  Patient left supine in bed and missed remaining 20 mins of session. Continue with current plan of care.      Pain No/Denies  Pain  Therapy/Group: Individual Therapy  Janea Schwenn 12/15/2018, 11:56 AM

## 2018-12-15 NOTE — Progress Notes (Signed)
Called D. [place], [place], that ASA is linked order and pt takes all meds PO, asked if okay to change to just one. This is okay, Called pharmacy due to linked order, said unable to remove that provider will have to do. Discontinued ASA 324 mg chewable and left ASA 325 mg po daily. Should be only one now for am dose.

## 2018-12-15 NOTE — Progress Notes (Signed)
Physical Therapy Session Note  Patient Details  Name: Curtis [name] MRN: [id number] Date of Birth: 25-May-1950  Today's Date: 12/15/2018 PT Individual Time: 0800-0910 PT Individual Time Calculation (min): 70 min   Short Term Goals: Week 1:  PT Short Term Goal 1 (Week 1): Pt will perform supine<>sit with CGA PT Short Term Goal 2 (Week 1): Pt will perform bed<>chair transfers wiht CGA PT Short Term Goal 3 (Week 1): Pt will ambulate at least 101ft using LRAD with CGA PT Short Term Goal 4 (Week 1): Pt will ascend/descend 8 steps using B HRs with CGA  Skilled Therapeutic Interventions/Progress Updates:   Pt in supine and agreeable to therapy, 6/10 pain in L ankle. No clinical signs or symptoms of DVT, donned thigh-high TEDs w/ total assist. Bed mobility w/ min assist and verbal reminders for sternal precautions. Donned shorts w/ min assist and transferred to w/c via stand pivot w/ min assist. Needed min cues throughout session to keep UEs in "tube" to adhere to sternal precautions. Total assist w/c transport to/from therapy gym. Performed Berg Balance Scale as detailed below and explained significance of results to pt including high risk of falls. Worked on endurance and gait training w/ and w/o AD. Ambulated 57' w/ RW x2 and 55' w/o AD, both w/ CGA-min assist and verbal cues for safety 2/2 baseline visual impairments. Ambulated 150' w/ AD and min assist, pt reported RPE 9/10 w/ gait, HR 103 bpm and O2 >94% on RA, mild increase in work of breathing. Ambulated 73' w/o AD and monitored vitals during gait, O2 remained >94% and HR 98-106 bpm. NuStep 5 min @ level 2 w/ BLEs only to work on endurance and BLE strengthening. Needed frequent but brief rest breaks 2/2 mild increase in work of breathing, vitals remained consistent w/ what's reported above. Returned to room via w/c, ended session in supine and all needs in reach.   Therapy Documentation Precautions:  Precautions Precautions: Fall,  Sternal Restrictions Weight Bearing Restrictions: No Other Position/Activity Restrictions: sternal precautions Balance: Standardized Balance Assessment Standardized Balance Assessment: Berg Balance Test Berg Balance Test Sit to Stand: Able to stand  independently using hands Standing Unsupported: Able to stand 2 minutes with supervision Sitting with Back Unsupported but Feet Supported on Floor or Stool: Able to sit safely and securely 2 minutes Stand to Sit: Controls descent by using hands Transfers: Able to transfer safely, definite need of hands Standing Unsupported with Eyes Closed: Able to stand 10 seconds with supervision Standing Ubsupported with Feet Together: Able to place feet together independently but unable to hold for 30 seconds From Standing, Reach Forward with Outstretched Arm: Reaches forward but needs supervision From Standing Position, Pick up Object from Floor: Unable to pick up shoe, but reaches 2-5 cm (1-2") from shoe and balances independently From Standing Position, Turn to Look Behind Over each Shoulder: Turn sideways only but maintains balance Turn 360 Degrees: Needs close supervision or verbal cueing Standing Unsupported, Alternately Place Feet on Step/Stool: Able to complete >2 steps/needs minimal assist Standing Unsupported, One Foot in Front: Needs help to step but can hold 15 seconds Standing on One Leg: Unable to try or needs assist to prevent fall Total Score: 29  Therapy/Group: Individual Therapy  Ashlynne Shetterly [name] 12/15/2018, 9:29 AM

## 2018-12-15 NOTE — Progress Notes (Addendum)
[place] PHYSICAL MEDICINE & REHABILITATION PROGRESS NOTE   Subjective/Complaints:  Tired from therapy this morning. Apparently left ankle was sore yesterday. Felt better today but still sore. Doesn't know why it's bothering him  ROS: Patient denies fever, rash, sore throat, blurred vision, nausea, vomiting, diarrhea, cough, shortness of breath or chest pain,   headache, or mood change.   Objective:   No results found. Recent Labs    12/14/18 0405 12/15/18 0410  WBC 16.1* 13.5*  HGB 9.8* 10.0*  HCT 31.6* 31.6*  PLT 402* 398   Recent Labs    12/14/18 0405 12/15/18 0410  NA 135 134*  K 3.9 3.8  CL 101 100  CO2 23 23  GLUCOSE 133* 118*  BUN 34* 29*  CREATININE 1.65* 1.43*  CALCIUM 7.9* 8.0*    Intake/Output Summary (Last 24 hours) at 12/15/2018 1155 Last data filed at 12/15/2018 0900 Gross per 24 hour  Intake 476 ml  Output -  Net 476 ml     Physical Exam: Vital Signs Blood pressure 128/67, pulse 88, temperature 97.8 F (36.6 C), resp. rate 18, height 5\' 8"  (1.727 m), weight 81.8 kg, SpO2 97 %.  Constitutional: No distress . Vital signs reviewed. HEENT: EOMI, oral membranes moist Neck: supple Cardiovascular: RRR without murmur. No JVD    Respiratory: CTA Bilaterally without wheezes or rales. Normal effort    GI: BS +, non-tender, non-distended  Extremities: No clubbing, cyanosis, or edema Skin: Sternotomy incision CDI.  sutures epigastric area Neurologic: Cranial nerves II through XII intact, motor strength is 5/5 in bilateral deltoid, bicep, tricep, grip. 3/5 hip flexor, knee extensors. 4/5 ankle dorsiflexor and plantar flexor   Musculoskeletal: Full range of motion in all 4 extremities. No joint swelling although had pain with inversion and dorsiflexion of left ankle. No obvious warmth or tenderness to palpation    Assessment/Plan: 1. Functional deficits secondary to Debility following CABG  which require 3+ hours per day of interdisciplinary therapy  in a comprehensive inpatient rehab setting.  Physiatrist is providing close team supervision and 24 hour management of active medical problems listed below.  Physiatrist and rehab team continue to assess barriers to discharge/monitor patient progress toward functional and medical goals  Care Tool:  Bathing    Body parts bathed by patient: Right arm, Left arm, Chest, Abdomen, Front perineal area, Buttocks, Right upper leg, Left upper leg, Face   Body parts bathed by helper: Right lower leg, Left lower leg     Bathing assist Assist Level: Minimal Assistance - Patient > 75%     Upper Body Dressing/Undressing Upper body dressing   What is the patient wearing?: Pull over shirt    Upper body assist Assist Level: Maximal Assistance - Patient 25 - 49%    Lower Body Dressing/Undressing Lower body dressing      What is the patient wearing?: Incontinence brief     Lower body assist Assist for lower body dressing: Total Assistance - Patient < 25%     Toileting Toileting    Toileting assist Assist for toileting: Maximal Assistance - Patient 25 - 49%     Transfers Chair/bed transfer  Transfers assist     Chair/bed transfer assist level: Minimal Assistance - Patient > 75%     Locomotion Ambulation   Ambulation assist      Assist level: Minimal Assistance - Patient > 75% Assistive device: No Device Max distance: 32'   Walk 10 feet activity   Assist     Assist  level: Minimal Assistance - Patient > 75% Assistive device: No Device   Walk 50 feet activity   Assist    Assist level: Minimal Assistance - Patient > 75% Assistive device: No Device    Walk 150 feet activity   Assist Walk 150 feet activity did not occur: Safety/medical concerns         Walk 10 feet on uneven surface  activity   Assist     Assist level: Minimal Assistance - Patient > 75% Assistive device: Hand held assist   Wheelchair     Assist Will patient use wheelchair at  discharge?: (TBD but anticipate pt will be functional ambulator)             Wheelchair 50 feet with 2 turns activity    Assist            Wheelchair 150 feet activity     Assist          Blood pressure 128/67, pulse 88, temperature 97.8 F (36.6 C), resp. rate 18, height 5\' 8"  (1.727 m), weight 81.8 kg, SpO2 97 %.    Medical Problem List and Plan:  1. Debility secondary to CAD with non-STEMI status post CABG with MVR 11/30/2018 with postoperative mediastinal bleeding/cardiogenic shock/acute respiratory failure with removal of Impella left ventricular assistive device evacuation of left hemothorax 12/04/2018. Sternal precautions   -continue CIR PT, OT  2. Antithrombotics:  -DVT/anticoagulation: Lovenox. Monitor for any bleeding episodes  -antiplatelet therapy: Aspirin 325 mg daily  3. Pain Management: Tramadol as needed   -?OA left ankle. Does not appear to be gout---uric acid level ordered  -sx better today per PT  -will add scheduled voltaren gel  -no splint for now (pt doesn't want) 4. Mood: Provide emotional support  -antipsychotic agents: N/A  5. Neuropsych: This patient is? Fully capable of making decisions on his own behalf.  6. Skin/Wound Care: Routine skin checks   -removed staples from neck/chest  -continue abdominal sutures from LVAD removal 7. Fluids/Electrolytes/Nutrition: Routine in and outs.  -I personally reviewed the patient's labs today.  \   -BUN/Cr trending up--hold demadex, push fluids 8. Acute blood loss anemia. CBC ordered  9. Hypertension. Entresto 24-26 mg twice daily, Aldactone 25 mg daily, Demadex 20 mg daily reduced to 10mg ---hold 10/12.  Resume tomorrow  Vitals:   12/14/18 1921 12/15/18 0516  BP: (!) 116/57 128/67  Pulse: 89 88  Resp: 18 18  Temp: 97.8 F (36.6 C) 97.8 F (36.6 C)  SpO2: 99% 97%  Controlled 10/13 10. PAF. Amiodarone 200 mg daily. Cardiac rate controlled. Monitor with increased physical exertion.  11.  Leukocytosis. recurrent. Urine culture blood cultures no growth-final  - Empiric vancomycin and cefepime discontinued. Repeat CBC reveals ongoing leukocytosis 16k 10/12---improved to 13k today  -continue to monitor serially.   -afebrile, wounds clean 12. Prediabetes. Levemir 5 units every 12 hours---already stopped  -add oral agent if needed.  -need at least daily checks of cbg's--today's am sugar borderline  Diabetic teaching.    CBG (last 3)  Recent Labs    12/15/18 0518  GLUCAP 114*   13. Combined CHF  EF of 25-30%   Daily weights , intake poor and BP soft, demadex held today---resume tomorrow  -weight appears down overall although readings inconsistent Filed Weights   12/12/18 0600 12/13/18 0231 12/15/18 0500  Weight: 84.9 kg 87 kg 81.8 kg       LOS: 4 days A FACE TO FACE EVALUATION WAS PERFORMED  02/12/19 T  Riley KillSwartz 12/15/2018, 11:55 AM

## 2018-12-15 NOTE — Consult Note (Signed)
Neuropsychological Consultation   Patient:   Curtis [name]   DOB:   Aug 07, 1950  MR Number:  [id number]  Location:  [facility] [id number] [place] [address] Dept: [place]: [phone]           Date of Service:   12/15/2018  Start Time:   3 PM End Time:   4 PM  Provider/Observer:  Ilean Skill, Psy.D.       Clinical Neuropsychologist       Billing Code/Service: [phone]  Chief Complaint:    Curtis [name] is a 68 year old male with a history of tobacco abuse on no prescription medications.  The patient presented on 11/24/2018 with worsening shortness of breath, tachycardia, and tachypnea.  Oxygen saturation 60% with room air.  There are reports of multiple syncopal episodes in route but patient never lost pulse.  He did receive epinephrine as well as Solu-Medrol.  The patient underwent cardiac catheterization per Dr. Daneen Schick showing severe multivessel CAD with 40% tubular left main.  The patient underwent CABG x3 as well as mitral valve replacement on 11/30/2018.  Postoperative cardiogenic shock with mediastinal bleeding and patient return to emergently to the OR and underwent removal of Impella left ventricular assist device evacuation of left hemothorax.  The patient has had intermittent bouts of confusion that is steadily improving postoperatively.  The patient is now admitted to the comprehensive rehabilitation program due to debility.  Reason for Service:  The patient was referred for neuropsychological consult due to coping issues as well as concerns about cognition.  Below is the HPI for the current admission.  HPI: Curtis [name] is a 68 year old right-handed male with history of tobacco abuse on no prescription medications. History taken from chart review and patient due to delayed processing. Per chart review he lives with elderly parent. He is retired. Independent prior to admission to  level home with 3 steps to entry. 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 Presented 11/24/2018 with worsening SOB, tachycardia, and tachypnea. Oxygen saturations 60% on room air. There were reports of multiple syncopal episodes in route but patient never lost pulses. He did receive epinephrine 0.3 mg as well as Solu-Medrol. Patient placed on BiPAP, high-sensitivity troponin 63. ECG showed narrow complex tachycardia. BUN 16, creatinine 1.4, COVID negative. Chest x-ray showed mild cardiomegaly and moderate pulmonary edema. Echocardiogram with EF ~25%. Underwent cardiac catheterization per Dr. Daneen Schick showing severe multivessel CAD with 40% tubular left main. 60 to 70% proximal LAD tandem lesions. Severe 80% diffuse disease in the large first diagonal. Underwent CABG x3 as well as mitral valve replacement 11/30/2018 per Dr. Orvan Seen of [facility]. Postoperative cardiogenic shock with mediastinal bleeding and patient returned emergently to the OR and underwent removal of Impella left ventricular assistive device evacuation of left hemothorax 12/04/2018. Patient was extubated 12/05/2018. CT negative for pulmonary emboli or significant effusion or infiltrate. Hospital course further complicated by pain and ABLA, 10.1 as well as leukocytosis 17,300 improved to 15,900 and patient had been maintained on empiric vancomycin and cefepime. Urine culture and blood cultures no growth. Subcutaneous Lovenox for DVT prophylaxis. Patient is currently maintained also on aspirin 325 mg daily after CABG. Patient had initially been placed on milrinone transition to amiodarone 200 mg twice daily. Findings of mildly elevated hemoglobin A1c 5.8 with sliding scale insulin and low-dose Levemir added. Patient is tolerating a regular consistency diet. There have been intermittent bouts of confusion  that is steadily improve postoperatively with therapies initiated and sternal precautions as directed. Therapy evaluations completed and  patient was admitted for a comprehensive rehab program.  Current Status:  Initial interaction with the patient once entering his room the patient was sound asleep and hard to awaken.  However, he did partially become alert but maintained his eyes closed the whole time.  The patient reports that he been very tired and was asleep when I came into the room.  The patient described pain in his toe.  The patient oriented to some degree and denied any acute changes and just said that he can have a hard time waking up.  The patient asked that I return in another time to complete this assessment.  We will try to follow-up with him tomorrow when he is able to have more of a viable interaction.   Diagnosis:   Debility        Electronically Signed   _______________________ Arley Phenix, Psy.D.

## 2018-12-15 NOTE — Progress Notes (Addendum)
Patient ID: Curtis [name], male   DOB: [birth year], 69 y.o.   MRN: [id number]    Advanced Heart Failure Rounding Note   Subjective:   Denies SOB. Denies chest pain. Having loose stools. + Pain in left ankle   Objective:   Weight Range:  Vital Signs:   Temp:  [97.6 F (36.4 C)-97.8 F (36.6 C)] 97.8 F (36.6 C) (10/13 0516) Pulse Rate:  [88-95] 88 (10/13 0516) Resp:  [17-18] 18 (10/13 0516) BP: (101-128)/(56-67) 128/67 (10/13 0516) SpO2:  [97 %-99 %] 97 % (10/13 0516) Weight:  [81.8 kg] 81.8 kg (10/13 0500) Last BM Date: 12/14/18  Weight change: Filed Weights   12/12/18 0600 12/13/18 0231 12/15/18 0500  Weight: 84.9 kg 87 kg 81.8 kg    Intake/Output:   Intake/Output Summary (Last 24 hours) at 12/15/2018 1009 Last data filed at 12/15/2018 0900 Gross per 24 hour  Intake 476 ml  Output -  Net 476 ml     Physical Exam: General:   No resp difficulty. In bed.  HEENT: normal anicteric  Neck: supple. no JVD. Carotids 2+ bilat; no bruits. No lymphadenopathy or thryomegaly appreciated. Cor: sternal wound ok PMI nondisplaced. Regular rate & rhythm. No rubs, gallops or murmurs. Lungs: coarse throughout. On room air.  Abdomen: soft, nontender, nondistended. No hepatosplenomegaly. No bruits or masses. Good bowel sounds. Extremities: no cyanosis, clubbing, rash, edema L ankle mildly tender Neuro: alert & orientedx3, cranial nerves grossly intact. moves all 4 extremities w/o difficulty. Affect pleasant  Labs: Basic Metabolic Panel: Recent Labs  Lab 12/09/18 0424 12/10/18 0530 12/10/18 0750 12/11/18 0330 12/14/18 0405 12/15/18 0410  NA 132*  --  136 135 135 134*  K 3.1*  --  3.3* 3.9 3.9 3.8  CL 102  --  104 102 101 100  CO2 21*  --  22 22 23 23   GLUCOSE 96  --  94 118* 133* 118*  BUN 18  --  18 24* 34* 29*  CREATININE 1.13  --  1.01 1.24 1.65* 1.43*  CALCIUM 7.4*  --  7.4* 7.9* 7.9* 8.0*  MG 2.1 2.2  --   --   --   --     Liver Function Tests: Recent Labs  Lab  12/14/18 0405  AST 26  ALT 35  ALKPHOS 64  BILITOT 1.4*  PROT 5.6*  ALBUMIN 2.4*   No results for input(s): LIPASE, AMYLASE in the last 168 hours. No results for input(s): AMMONIA in the last 168 hours.  CBC: Recent Labs  Lab 12/09/18 0424 12/10/18 0750 12/11/18 0330 12/14/18 0405 12/15/18 0410  WBC 12.8* 12.3* 15.9* 16.1* 13.5*  NEUTROABS 9.9*  --   --  12.3*  --   HGB 9.2* 10.1* 10.8* 9.8* 10.0*  HCT 29.4* 32.3* 34.4* 31.6* 31.6*  MCV 96.4 96.4 96.1 97.5 96.6  PLT 262 329 362 402* 398    Cardiac Enzymes: No results for input(s): CKTOTAL, CKMB, CKMBINDEX, TROPONINI in the last 168 hours.  BNP: BNP (last 3 results) Recent Labs    11/23/18 2328  BNP 507.9*    ProBNP (last 3 results) No results for input(s): PROBNP in the last 8760 hours.    Other results:  Imaging: No results found.   Medications:     Scheduled Medications: . amiodarone  200 mg Oral BID  . aspirin EC  325 mg Oral Daily   Or  . aspirin  324 mg Per Tube Daily  . chlorhexidine  15 mL Mouth Rinse BID  .  Chlorhexidine Gluconate Cloth  6 each Topical Daily  . feeding supplement (PRO-STAT SUGAR FREE 64)  30 mL Oral Daily  . guaiFENesin  600 mg Oral BID  . influenza vaccine adjuvanted  0.5 mL Intramuscular Tomorrow-1000  . multivitamin with minerals  1 tablet Oral Daily  . pantoprazole  40 mg Oral Daily  . rosuvastatin  40 mg Oral q1800  . sacubitril-valsartan  1 tablet Oral BID  . senna-docusate  1 tablet Oral BID  . sodium chloride flush  10-40 mL Intracatheter Q12H  . spironolactone  25 mg Oral Daily  . [START ON 12/16/2018] torsemide  10 mg Oral Daily    Infusions:   PRN Medications: oxyCODONE, sodium chloride flush, sorbitol, traMADol   Assessment/Plan:   1. Post-cardiotomy cardiogenic shock in setting if severe iCM and post-operative bleeding/hemorrhagic shock - pre-op echo EF 25-30% - s/p CABG & bioprosthetic MVR with Impella placement on 9/28 c/b severe  coagulopathy/bleeding requiring OR takeback  - On 10/2 Impella removal and evacuation of left hemothorax - Volume status stable. Continue torsemide 10 mg daily.  - Continue spiro 25 mg daily - Continue Entresto   2. Acute hypoxemic respiratory failure - Extubated 9/30 - Evacuation hemothorax in OR 10/2 and re-intubated.  - Extubated 10/3.   - Resolved. On rrom air.   3. Severe MR - s/p bioprosthetic MVR. Stable   4. Acute blood loss anemia - transfuse to keep hgb >= 8.0 - hgb 10/8 today  5. CAD with NSTEMI - s/p CABG 9/28 - on ASA/statin - b-blocker and Plavix when ready  6. Thrombocytopenia - resolved.  7. PAF - Regular pulse.  On po amio  8. DVT prophylaxis - Lovenox.   9. Leukocytosis - WBC trending down.    Length of Stay: 4   Amy Clegg NP-C  12/15/2018, 10:09 AM  Advanced Heart Failure Team Pager [phone] (M-F; [place])  Please contact [facility] for night-coverage after hours (4p -7a ) and weekends on [email or web address]  Patient seen and examined with the above-signed Advanced Practice Provider and/or Housestaff. I personally reviewed laboratory data, imaging studies and relevant notes. I independently examined the patient and formulated the important aspects of the plan. I have edited the note to reflect any of my changes or salient points. I have personally discussed the plan with the patient and/or family.  He looks much better. On the dry side. Maintaining NSR. Torsemide held. Creatinine improving. C/o left ankle pain. Suspect gout.   Would stop diuretics for now. Check uric acid.   Glori Bickers, MD  11:06 AM

## 2018-12-16 ENCOUNTER — Inpatient Hospital Stay (HOSPITAL_COMMUNITY): Payer: Medicare Other

## 2018-12-16 ENCOUNTER — Inpatient Hospital Stay (HOSPITAL_COMMUNITY): Payer: Medicare Other | Admitting: Occupational Therapy

## 2018-12-16 ENCOUNTER — Inpatient Hospital Stay (HOSPITAL_COMMUNITY): Payer: Medicare Other | Admitting: Speech Pathology

## 2018-12-16 ENCOUNTER — Inpatient Hospital Stay (HOSPITAL_COMMUNITY): Payer: Medicare Other | Admitting: Physical Therapy

## 2018-12-16 LAB — BASIC METABOLIC PANEL
Anion gap: 11 (ref 5–15)
BUN: 22 mg/dL (ref 8–23)
CO2: 23 mmol/L (ref 22–32)
Calcium: 8.2 mg/dL — ABNORMAL LOW (ref 8.9–10.3)
Chloride: 101 mmol/L (ref 98–111)
Creatinine, Ser: 1.12 mg/dL (ref 0.61–1.24)
GFR calc Af Amer: >60 mL/min (ref >60)
GFR calc non Af Amer: >60 mL/min (ref >60)
Glucose, Bld: 128 mg/dL — ABNORMAL HIGH (ref 70–99)
Potassium: 3.9 mmol/L (ref 3.5–5.1)
Sodium: 135 mmol/L (ref 135–145)

## 2018-12-16 LAB — GLUCOSE, CAPILLARY
Glucose-Capillary: 125 mg/dL — ABNORMAL HIGH (ref 70–99)
Glucose-Capillary: 133 mg/dL — ABNORMAL HIGH (ref 70–99)

## 2018-12-16 LAB — URIC ACID: Uric Acid, Serum: 6.3 mg/dL (ref 3.7–8.6)

## 2018-12-16 NOTE — Progress Notes (Signed)
Social Work Patient ID: Curtis [name], male   DOB: 05-08-1950, 68 y.o.   MRN: [id number]   Have reviewed team conference with pt and friend, Randall Hiss. Both aware and agreeable with targeted d/c date of 10/23 and supervision goals overall.  Randall Hiss is working on confirming some support for pt and his elderly mother when pt returns home.  Torrence Branagan, LCSW

## 2018-12-16 NOTE — Care Management (Signed)
Inpatient [facility] Individual Statement of Services  Patient Name:  Curtis [name]  Date:  12/16/2018  Welcome to the [facility].  Our goal is to provide you with an individualized program based on your diagnosis and situation, designed to meet your specific needs.  With this comprehensive rehabilitation program, you will be expected to participate in at least 3 hours of rehabilitation therapies Monday-Friday, with modified therapy programming on the weekends.  Your rehabilitation program will include the following services:  Physical Therapy (PT), Occupational Therapy (OT), Speech Therapy (ST), 24 hour per day rehabilitation nursing, Therapeutic Recreaction (TR), Neuropsychology, Case Management (Social Worker), Rehabilitation Medicine, Nutrition Services and Pharmacy Services  Weekly team conferences will be held on Tuesdays to discuss your progress.  Your Social Worker will talk with you frequently to get your input and to update you on team discussions.  Team conferences with you and your family in attendance may also be held.  Expected length of stay: 14-16 days   Overall anticipated outcome: supervision  Depending on your progress and recovery, your program may change. Your Social Worker will coordinate services and will keep you informed of any changes. Your Social Worker's name and contact numbers are listed  below.  The following services may also be recommended but are not provided by the [facility] will be made to provide these services after discharge if needed.  Arrangements include referral to agencies that provide these services.  Your insurance has been verified to be:  Medicare Your primary doctor is:  none  Pertinent information will be shared with your doctor and your insurance company.  Social Worker:  [place], [place] or ([phone]   Information discussed with and copy given to patient by: Lennart Pall, 12/16/2018, 12:55 PM

## 2018-12-16 NOTE — Progress Notes (Signed)
Occupational Therapy Session Note  Patient Details  Name: Curtis [name] MRN: [id number] Date of Birth: 12/01/1950  Today's Date: 12/16/2018 OT Individual Time: 1403-1500 OT Individual Time Calculation (min): 57 min    Short Term Goals: Week 1:  OT Short Term Goal 1 (Week 1): Pt will be able to stand up following sternal precautions with CGA. OT Short Term Goal 2 (Week 1): Pt will be able to transfer to toilet following sternal precautions with CGA. OT Short Term Goal 3 (Week 1): Pt will bathe LB with CGA. OT Short Term Goal 4 (Week 1): Pt will dress LB with CGA.  Skilled Therapeutic Interventions/Progress Updates:    Pt greeted semi-reclined in bed with eyes closed, but easy to wake and reluctantly agreeable to OT treatment session. Pt declines any bathing/dressing, but wanted to shave. Pt needed verbal cues for hand placement with sit<>stand on RW and min A. Pt then ambulated to the sink with min A and verbal cues for RW positioning at the sink. Worked on standing balance/endurance within shaving task. Pt tolerated standing for 3, 2 minute intervals while shaving. Pt needed extended rest breaks after standing bouts. OT noted smell of urine and asked pt if we needed to change his brief. Pt unaware that he had been incontinent of urine. Worked on sit<>stands at the sink and balance to doff pants. Pt then washed buttocks and peri-area with CGA for balance. OT placed clean brief, then pt needed extended rest break to recover from standing. Pt needed mod A to thread pant legs sitting in wc, then min A to stand and pull up pants. Pt doffed shirt, washed under arms with set-up A, then needed min A to don clean shirt. Educated on strategies to maintain sternal precautions within dressing tasks. Pt requested to return to bed at end of session and ambulated 5 feet w./ RW and CGA. Pt left semi-reclined in bed with bed alarm on and call bell in reach, needs met.   Therapy Documentation Precautions:   Precautions Precautions: Fall, Sternal Restrictions Weight Bearing Restrictions: Yes(sternal precautions) Other Position/Activity Restrictions: sternal precautions Pain:   denies pain  Therapy/Group: Individual Therapy  Valma Cava 12/16/2018, 2:33 PM

## 2018-12-16 NOTE — Progress Notes (Signed)
[facility] PHYSICAL MEDICINE & REHABILITATION PROGRESS NOTE   Subjective/Complaints:  No new issues today. Would like to stop mucinex. Left ankle better but still sore.   ROS: Patient denies fever, rash, sore throat, blurred vision, nausea, vomiting, diarrhea, cough, shortness of breath or chest pain,  headache, or mood change.    Objective:   No results found. Recent Labs    12/14/18 0405 12/15/18 0410  WBC 16.1* 13.5*  HGB 9.8* 10.0*  HCT 31.6* 31.6*  PLT 402* 398   Recent Labs    12/15/18 0410 12/16/18 0537  NA 134* 135  K 3.8 3.9  CL 100 101  CO2 23 23  GLUCOSE 118* 128*  BUN 29* 22  CREATININE 1.43* 1.12  CALCIUM 8.0* 8.2*    Intake/Output Summary (Last 24 hours) at 12/16/2018 0900 Last data filed at 12/16/2018 0835 Gross per 24 hour  Intake 658 ml  Output -  Net 658 ml     Physical Exam: Vital Signs Blood pressure (!) 119/59, pulse 90, temperature 98.2 F (36.8 C), temperature source Oral, resp. rate 18, height 5\' 8"  (1.727 m), weight 82.3 kg, SpO2 99 %.  Constitutional: No distress . Vital signs reviewed. HEENT: EOMI, oral membranes moist Neck: supple Cardiovascular: RRR without murmur. No JVD    Respiratory: CTA Bilaterally without wheezes or rales. Normal effort    GI: BS +, non-tender, non-distended  Extremities: No clubbing, cyanosis, or edema Skin: Sternotomy incision CDI.  sutures epigastric area Neurologic: Cranial nerves II through XII intact, motor strength is 5/5 in bilateral deltoid, bicep, tricep, grip. 3/5 hip flexor, knee extensors. 4/5 ankle dorsiflexor and plantar flexor   Musculoskeletal: Full range of motion in all 4 extremities. Mild lateral leftankle swelling although had pain with inversion and dorsiflexion of left ankle. No  Warmth., only mild tenderness to palpation    Assessment/Plan: 1. Functional deficits secondary to Debility following CABG  which require 3+ hours per day of interdisciplinary therapy in a  comprehensive inpatient rehab setting.  Physiatrist is providing close team supervision and 24 hour management of active medical problems listed below.  Physiatrist and rehab team continue to assess barriers to discharge/monitor patient progress toward functional and medical goals  Care Tool:  Bathing    Body parts bathed by patient: Right arm, Left arm, Chest, Abdomen, Front perineal area, Buttocks, Right upper leg, Left upper leg, Face   Body parts bathed by helper: Right lower leg, Left lower leg     Bathing assist Assist Level: Minimal Assistance - Patient > 75%     Upper Body Dressing/Undressing Upper body dressing   What is the patient wearing?: Pull over shirt    Upper body assist Assist Level: Maximal Assistance - Patient 25 - 49%    Lower Body Dressing/Undressing Lower body dressing      What is the patient wearing?: Incontinence brief     Lower body assist Assist for lower body dressing: Total Assistance - Patient < 25%     Toileting Toileting    Toileting assist Assist for toileting: Maximal Assistance - Patient 25 - 49%     Transfers Chair/bed transfer  Transfers assist     Chair/bed transfer assist level: Minimal Assistance - Patient > 75%     Locomotion Ambulation   Ambulation assist      Assist level: Minimal Assistance - Patient > 75% Assistive device: No Device Max distance: 75'   Walk 10 feet activity   Assist     Assist level:  Minimal Assistance - Patient > 75% Assistive device: No Device   Walk 50 feet activity   Assist    Assist level: Minimal Assistance - Patient > 75% Assistive device: No Device    Walk 150 feet activity   Assist Walk 150 feet activity did not occur: Safety/medical concerns         Walk 10 feet on uneven surface  activity   Assist     Assist level: Minimal Assistance - Patient > 75% Assistive device: Hand held assist   Wheelchair     Assist Will patient use wheelchair at  discharge?: (TBD but anticipate pt will be functional ambulator)             Wheelchair 50 feet with 2 turns activity    Assist            Wheelchair 150 feet activity     Assist          Blood pressure (!) 119/59, pulse 90, temperature 98.2 F (36.8 C), temperature source Oral, resp. rate 18, height 5\' 8"  (1.727 m), weight 82.3 kg, SpO2 99 %.    Medical Problem List and Plan:  1. Debility secondary to CAD with non-STEMI status post CABG with MVR 11/30/2018 with postoperative mediastinal bleeding/cardiogenic shock/acute respiratory failure with removal of Impella left ventricular assistive device evacuation of left hemothorax 12/04/2018. Sternal precautions   -continue CIR PT, OT  2. Antithrombotics:  -DVT/anticoagulation: Lovenox. Monitor for any bleeding episodes  -antiplatelet therapy: Aspirin 325 mg daily  3. Pain Management: Tramadol as needed   -?OA left ankle. Does not appear to be gout---uric acid level WNL  -sx better  But still tener  -continue scheduled voltaren gel  -check xrays of ankle 4. Mood: Provide emotional support  -antipsychotic agents: N/A  5. Neuropsych: This patient is? Fully capable of making decisions on his own behalf.  6. Skin/Wound Care: Routine skin checks   -removed staples from neck/chest  -continue abdominal sutures from LVAD removal 7. Fluids/Electrolytes/Nutrition: Routine in and outs.  -I personally reviewed the patient's labs today.  \   -BUN/Cr trending up--resumed demadex today 8. Acute blood loss anemia. CBC ordered  9. Hypertension. Entresto 24-26 mg twice daily, Aldactone 25 mg daily, Demadex 20 mg daily reduced to 10mg ---h3ld 10/12.  Resume today Vitals:   12/15/18 1956 12/16/18 0219  BP: (!) 123/53 (!) 119/59  Pulse: 96 90  Resp: 17 18  Temp: 97.6 F (36.4 C) 98.2 F (36.8 C)  SpO2: 99% 99%  Controlled 10/14 10. PAF. Amiodarone 200 mg daily. Cardiac rate controlled. Monitor with increased physical exertion.   11. Leukocytosis. recurrent. Urine culture blood cultures no growth-final  - Empiric vancomycin and cefepime discontinued. Repeat CBC reveals ongoing leukocytosis 16k 10/12---improved to 13k today  -continue to monitor serially.   -afebrile, wounds clean 12. Prediabetes. Levemir 5 units every 12 hours---already stopped  -add oral agent if needed.  -need at least daily checks of cbg's--today's am sugar again borderline  Diabetic teaching.    CBG (last 3)  Recent Labs    12/15/18 0518 12/16/18 0620  GLUCAP 114* 125*   13. Combined CHF  EF of 25-30%   Daily weights , intake poor and BP soft, demadex held today---resume tomorrow  -weight appears down overall although readings have been inconsistent Filed Weights   12/13/18 0231 12/15/18 0500 12/16/18 0500  Weight: 87 kg 81.8 kg 82.3 kg       LOS: 5 days A FACE TO FACE EVALUATION  WAS PERFORMED  Ranelle OysterZachary T Swartz 12/16/2018, 9:00 AM

## 2018-12-16 NOTE — Progress Notes (Signed)
Physical Therapy Session Note  Patient Details  Name: Curtis [name] MRN: [id number] Date of Birth: [birth year]  Today's Date: 12/16/2018 PT Individual Time: 1130-1200 PT Individual Time Calculation (min): 30 min   Short Term Goals: Week 1:  PT Short Term Goal 1 (Week 1): Pt will perform supine<>sit with CGA PT Short Term Goal 2 (Week 1): Pt will perform bed<>chair transfers wiht CGA PT Short Term Goal 3 (Week 1): Pt will ambulate at least 70ft using LRAD with CGA PT Short Term Goal 4 (Week 1): Pt will ascend/descend 8 steps using B HRs with CGA  Skilled Therapeutic Interventions/Progress Updates:    Pt supine in bed upon PT arrival, agreeable to therapy tx and denies pain. Pt transferred to sitting EOB with min assist, cues for maintaining sternal precautions. Pt performed stand pivot to w/c with min assist, cues for techniques. Pt transported to the gym. Pt transferred to mat stand pivot with min assist. Pt performed 2 x 5 sit<>stands from mat without UE support working on LE strength and endurance, cues for techniques. Pt performed x8 toe taps per LE on aerobic step without UE support working on endurance and dynamic standing balance, x 2 trials this session with min assist. Pt worked on gait, endurance and standing balance to ambulated x 20 ft and x 25 ft without AD and min-mod assist. Pt transported back to room at end of session and left in w/c with needs in reach and chair alarm set.   Therapy Documentation Precautions:  Precautions Precautions: Fall, Sternal Restrictions Weight Bearing Restrictions: Yes(sternal precautions) Other Position/Activity Restrictions: sternal precautions    Therapy/Group: Individual Therapy  Netta Corrigan, PT, DPT, CSRS 12/16/18  8:25 AM    [name] 12/16/2018, 8:25 AM

## 2018-12-16 NOTE — Patient Care Conference (Addendum)
Inpatient RehabilitationTeam Conference and Plan of Care Update Date: 12/15/2018   Time: 10:35 am    Patient Name: Curtis [name]      Medical Record Number: [id number]  Date of Birth: 1950-08-28 Sex: Male         Room/Bed: 4W20C/4W20C-01 Payor Info: Payor: MEDICARE / Plan: MEDICARE PART A AND B / Product Type: *No Product type* /    Admit Date/Time:  12/11/2018  6:00 PM  Primary Diagnosis:  Debility  Patient Active Problem List   Diagnosis Date Noted  . Debility 12/11/2018  . S/P MVR (mitral valve replacement)   . Prediabetes   . PAF (paroxysmal atrial fibrillation) ([place])   . Acute blood loss anemia   . Chest tube in place   . S/P CABG x 3 11/30/2018  . Coronary artery disease involving native heart without angina pectoris   . Acute on chronic combined systolic and diastolic CHF (congestive heart failure) ([place])   . Acute respiratory failure with hypoxia and hypercapnia ([place]) 11/24/2018  . Pulmonary edema 11/24/2018  . Hypertensive emergency 11/24/2018  . Elevated troponin 11/24/2018  . Leukocytosis 11/24/2018  . NSTEMI (non-ST elevated myocardial infarction) ([place])   . Flash pulmonary edema ([place])   . Hypertension     Expected Discharge Date: Expected Discharge Date: 12/25/18  Team Members Present: Physician leading conference: Dr. Alger Simons Social Worker Present: Lennart Pall, LCSW Nurse Present: Isla Pence, RN PT Present: Burnard Bunting, PT OT Present: Cherylynn Ridges, OT SLP Present: Weston Anna, SLP PPS Coordinator present : Gunnar Fusi, SLP     Current Status/Progress Goal Weekly Team Focus  Bowel/Bladder   pt incontinent of B/B at times  possible consider time toileting  assess toileting q shift and prn   Swallow/Nutrition/ Hydration   Min-Mod A  Supervision  complex problem solving, recall, attention, awareness   ADL's   Mod A LB ADLs, Max A toileting, Min A transfers  Supervision  Sternal precautions, activity tolerance, visual deficits, self-care retraining    Mobility   min assist overall, gait up to 150' w/ RW, 50' w/o AD, 29/56 on [place]  24/7 supervision  endurance, global strengthening, balance, discharge planning   Communication             Safety/Cognition/ Behavioral Observations            Pain   pt has not had c/o pain during shift, has prns  remain pain free, pain <5  assess pain q shift and prn   Skin   abrasion and skin tear on buttocks (foam on), left leg incision, neck incision on left side, eccyymosis on posterior left leg, abdominal sutures, mid sternal incision with steri strips,   prevent further breakdown of skin  assess skin q shift and prn    Rehab Goals Patient on target to meet rehab goals: Yes *See Care Plan and progress notes for long and short-term goals.     Barriers to Discharge  Current Status/Progress Possible Resolutions Date Resolved   Nursing                  PT  Decreased caregiver support;Inaccessible home environment;Home environment access/layout;Lack of/limited family support;Behavior                 OT                  SLP                SW  Discharge Planning/Teaching Needs:  Pt to return home with elderly mother.  Local friends/ neighbor to assist with arranging additional support for them both for the first few weeks home.  Teaching needs TBD.   Team Discussion:  Debility following CABG and MVR;  Cardiology following.  Watching fluids (dry).  Medication adjustments;  surg wound look good.  Cont b/b but occasional incont hs.  Mod A LB and min tfs with S goals for OT.  amb 150 with min and S goals.  ?visual deficits and higher level cognitive impairments.    Revisions to Treatment Plan:  NA    Medical Summary Current Status: debility after CABG and LVAD removal. volume issues--cardiology following Weekly Focus/Goal: improve volume/bp's. optimize cv status  Barriers to Discharge: Medical stability   Possible Resolutions to Barriers: see medical progress  notes   Continued Need for Acute Rehabilitation Level of Care: The patient requires daily medical management by a physician with specialized training in physical medicine and rehabilitation for the following reasons: Direction of a multidisciplinary physical rehabilitation program to maximize functional independence : Yes Medical management of patient stability for increased activity during participation in an intensive rehabilitation regime.: Yes Analysis of laboratory values and/or radiology reports with any subsequent need for medication adjustment and/or medical intervention. : Yes   I attest that I was present, lead the team conference, and concur with the assessment and plan of the team.   Amada Jupiter 12/16/2018, 12:58 PM   Team conference was held via web/ teleconference due to COVID - 19

## 2018-12-16 NOTE — Progress Notes (Signed)
Speech Language Pathology Daily Session Note  Patient Details  Name: Curtis [name] MRN: [id number] Date of Birth: [birth year]  Today's Date: 12/16/2018 SLP Individual Time: 1000-1055 SLP Individual Time Calculation (min): 55 min  Short Term Goals: Week 1: SLP Short Term Goal 1 (Week 1): Given Min A cues, pt will recall sternal precautions in 8 out of 10 opportunities. SLP Short Term Goal 2 (Week 1): Given Min A cues, pt will utilize sternal precautions in 8 out of 10 opportunities. SLP Short Term Goal 3 (Week 1): Pt will demosntrate selective attention for 30 minutes with Min A cues. SLP Short Term Goal 4 (Week 1): Pt will demonstrate intellectual awareness by identifying 3 physical and cognitive deficits with Min A cues. SLP Short Term Goal 5 (Week 1): Pt will ocmplete semi-complex problem solving tasks with Min A cues. SLP Short Term Goal 6 (Week 1): Pt will utilize compensatory memory strategies to recall information with Min A cues in 8 out of 10 opportunities.  Skilled Therapeutic Interventions: Skilled treatment session focused on cognitive goals. SLP facilitated session by providing patient with a BID pill box separated into 2 colors that he was able to see (blue/yellow). However, patient unable to distinguish colored dots on medications to differentiate between AM/PM medications. Patient recalled events from morning therapy sessions with overall Min A verbal cues and required encouragement for verbal expression/social interaction due to fatigue. Patient disoriented to time of day and would benefit from a digital clock. Patient left upright in bed with alarm on and all needs within reach. Continue with current plan of care.      Pain No/Denies Pain   Therapy/Group: Individual Therapy  Oneida Mckamey 12/16/2018, 12:52 PM

## 2018-12-16 NOTE — Progress Notes (Signed)
Physical Therapy Session Note  Patient Details  Name: Curtis [name] MRN: [id number] Date of Birth: 16-Apr-1950  Today's Date: 12/16/2018 PT Individual Time: 0800-0855 PT Individual Time Calculation (min): 55 min   Short Term Goals: Week 1:  PT Short Term Goal 1 (Week 1): Pt will perform supine<>sit with CGA PT Short Term Goal 2 (Week 1): Pt will perform bed<>chair transfers wiht CGA PT Short Term Goal 3 (Week 1): Pt will ambulate at least 36ft using LRAD with CGA PT Short Term Goal 4 (Week 1): Pt will ascend/descend 8 steps using B HRs with CGA  Skilled Therapeutic Interventions/Progress Updates:   Pt in supine and agreeable to therapy, reports pain 8/10 in L ankle/foot at rest however no evidence of pain w/ active movement or in WB today. Bed mobility w/ min assist and donned pants w/ min assist 2/2 visual impairments. Ambulated to/from toilet w/ min assist using RW, supervision for LE garment management, min assist for thoroughness w/ pericare. Pt w/ continent BM. Pt w/ prolonged stance 2/2 pericare, requesting to rest in supine. Returned to seated EOB and pt reported he was out of breath, not short of breath. Pt then requesting to rest in supine but reported he was not sleepy. Sit>supine w/ supervision and supine rest for 2-3 minutes before returning to EOB. Moderate increase in work of breathing w/ ambulation and toileting, SpO2 @ 100% on RA and HR 95-100 bpm. Total assist w/c transport to/from therapy gym for energy conservation. Worked on endurance and LE strengthening on NuStep 3 min @ level 2 and 2 min @ level 1 w/ BLEs only. Pt reported this was 8/10 on modified RPE, unable to detect vitals but only mild increase in work of breathing on NuStep today. Worked on functional LE strengthening w/ sit<>stands w/o UE support from elevated mat surface, CGA for safety, performed 3x5 reps. Pt continueds to need prolonged rest break in between all bouts, education on energy conservation strategies and  impact his cardiac impairments have on endurance and fatigue. Returned to room total assist in w/c. Ended session in w/c, all needs in reach.   Therapy Documentation Precautions:  Precautions Precautions: Fall, Sternal Restrictions Weight Bearing Restrictions: Yes(sternal precautions) Other Position/Activity Restrictions: sternal precautions  Therapy/Group: Individual Therapy  Lashan Gluth K Kawhi Diebold 12/16/2018, 9:01 AM

## 2018-12-17 ENCOUNTER — Inpatient Hospital Stay (HOSPITAL_COMMUNITY): Payer: Medicare Other | Admitting: Occupational Therapy

## 2018-12-17 ENCOUNTER — Inpatient Hospital Stay (HOSPITAL_COMMUNITY): Payer: Medicare Other | Admitting: Speech Pathology

## 2018-12-17 ENCOUNTER — Inpatient Hospital Stay (HOSPITAL_COMMUNITY): Payer: Medicare Other | Admitting: Physical Therapy

## 2018-12-17 ENCOUNTER — Inpatient Hospital Stay (HOSPITAL_COMMUNITY): Payer: Medicare Other

## 2018-12-17 LAB — GLUCOSE, CAPILLARY
Glucose-Capillary: 120 mg/dL — ABNORMAL HIGH (ref 70–99)
Glucose-Capillary: 127 mg/dL — ABNORMAL HIGH (ref 70–99)
Glucose-Capillary: 129 mg/dL — ABNORMAL HIGH (ref 70–99)

## 2018-12-17 NOTE — Progress Notes (Signed)
Physical Therapy Session Note  Patient Details  Name: Curtis [name] MRN: [id number] Date of Birth: 04/03/1950  Today's Date: 12/17/2018 PT Individual Time: 1005-1050 PT Individual Time Calculation (min): 45 min   Short Term Goals: Week 1:  PT Short Term Goal 1 (Week 1): Pt will perform supine<>sit with CGA PT Short Term Goal 2 (Week 1): Pt will perform bed<>chair transfers wiht CGA PT Short Term Goal 3 (Week 1): Pt will ambulate at least 58ft using LRAD with CGA PT Short Term Goal 4 (Week 1): Pt will ascend/descend 8 steps using B HRs with CGA  Skilled Therapeutic Interventions/Progress Updates:   Pt in supine and agreeable to therapy, no c/o pain. Supine>sit w/ min assist and verbal cues for log roll technique and for sternal precautions. Ambulated to/from therpy gym w/ CGA using RW, 1 seated rest break halfway 2/2 fatigue. Mild increase in work of breathing w/ all mobility, resolves w/ brief rest. Worked on balance in standing and w/ gait this session. Static and dynamic standing balance to pin dark-colored clothespins to/from basketball hoop, CGA for balance w/ trunk rotation and lateral weight shifting. Ambulated 100' w/o AD, min assist, x2 reps. Increased work of breathing w/o AD compared to w/ AD, but again resolves w/ rest. Practiced negotiating steps w/ B rails, negotiated up/down 8 steps w/ CGA. SpO2 84% after stairs, BP 101. SpO2 returned to >93% w/ a few seconds of deep breathing. Pt's HR noted to also be ranging from 90-130 bpm during rest break. Returned to room total assist via w/c. BP 98/64 and pt reports increased fatigue, denies chest pain or SOB. Believes he just need to rest in supine. BP 109/65 in supine. Ended session in supine, all needs in reach. Made RN aware of low BP and HR.   Therapy Documentation Precautions:  Precautions Precautions: Fall, Sternal Restrictions Weight Bearing Restrictions: Yes(sternal precautions) Other Position/Activity Restrictions: sternal  precautions  Therapy/Group: Individual Therapy  Alayshia Marini K Deaken Jurgens 12/17/2018, 11:18 AM

## 2018-12-17 NOTE — Progress Notes (Signed)
Occupational Therapy Session Note  Patient Details  Name: Curtis [name] MRN: [id number] Date of Birth: September 02, 1950  Today's Date: 12/17/2018 OT Individual Time: 0900-0930 OT Individual Time Calculation (min): 30 min    Short Term Goals: Week 1:  OT Short Term Goal 1 (Week 1): Pt will be able to stand up following sternal precautions with CGA. OT Short Term Goal 2 (Week 1): Pt will be able to transfer to toilet following sternal precautions with CGA. OT Short Term Goal 3 (Week 1): Pt will bathe LB with CGA. OT Short Term Goal 4 (Week 1): Pt will dress LB with CGA.  Skilled Therapeutic Interventions/Progress Updates:    Patient supine in bed, agrees to get OOB for short therapy session.  Rolling right with CS and cues, sidelying to SSP with min A.  Sit to stand and short distance ambulation with RW to/from bed, toilet and w/c with CGA, cues for sternal precautions.  Completed toileting with max A for clothing management and hygiene after BM.  Brief soiled and max A to change, he is able to donn shorts with mod A.  Dependent for foot wear.  Completed standing activity 2 x 1 minute with light UB activity.  Seated marches and kicks with fair tolerance.  He insisted on going back to bed at close of session with CGA.  Bed alarm set and call bell in reach.    Therapy Documentation Precautions:  Precautions Precautions: Fall, Sternal Restrictions Weight Bearing Restrictions: Yes(sternal precaution) Other Position/Activity Restrictions: sternal precautions General: General PT Missed Treatment Reason: Patient fatigue Vital Signs:   Pain: Pain Assessment Pain Scale: 0-10 Pain Score: 0-No pain   Other Treatments:     Therapy/Group: Individual Therapy  [name] 12/17/2018, 12:07 PM

## 2018-12-17 NOTE — Progress Notes (Signed)
Speech Language Pathology Daily Session Note  Patient Details  Name: Curtis [name] MRN: [id number] Date of Birth: 1950-03-05  Today's Date: 12/17/2018 SLP Individual Time: 0725-0820 SLP Individual Time Calculation (min): 55 min  Short Term Goals: Week 1: SLP Short Term Goal 1 (Week 1): Given Min A cues, pt will recall sternal precautions in 8 out of 10 opportunities. SLP Short Term Goal 2 (Week 1): Given Min A cues, pt will utilize sternal precautions in 8 out of 10 opportunities. SLP Short Term Goal 3 (Week 1): Pt will demosntrate selective attention for 30 minutes with Min A cues. SLP Short Term Goal 4 (Week 1): Pt will demonstrate intellectual awareness by identifying 3 physical and cognitive deficits with Min A cues. SLP Short Term Goal 5 (Week 1): Pt will ocmplete semi-complex problem solving tasks with Min A cues. SLP Short Term Goal 6 (Week 1): Pt will utilize compensatory memory strategies to recall information with Min A cues in 8 out of 10 opportunities.  Skilled Therapeutic Interventions: Skilled treatment session focused on cognitive goals. Upon arrival, patient was asleep in bed but easily awakened. SLP facilitated session by providing tray set-up and overall Min A verbal cues for patient to locate items on tray during self-feeding. Patient disoriented to time of day, therefore, patient was given a digital clock to utilize orientation in which he was able to use efficiently with supervision verbal cues. SLP also facilitated session by providing Mod A verbal and tactile cues for problem solving and awareness of errors during a BID medication management task. Suspect function is impacted by poor vision which patient reports is worse since hospitalization. Patient left supine in bed with alarm on and all needs within reach. Continue with current plan of care.      Pain Pain Assessment Pain Scale: 0-10 Pain Score: 0-No pain  Therapy/Group: Individual Therapy  Mackynzie Woolford,  Olie Scaffidi 12/17/2018, 12:37 PM

## 2018-12-17 NOTE — Progress Notes (Signed)
[place] PHYSICAL MEDICINE & REHABILITATION PROGRESS NOTE   Subjective/Complaints:  Left ankle feeling better. No problems overnight. Denies coughing or breathing problems today  ROS: Patient denies fever, rash, sore throat, blurred vision, nausea, vomiting, diarrhea, cough, shortness of breath or chest pain, joint or back pain, headache, or mood change.    Objective:   Dg Ankle 2 Views Left  Result Date: 12/16/2018 CLINICAL DATA:  Pain, no injury EXAM: LEFT ANKLE - 2 VIEW COMPARISON:  None. FINDINGS: No fracture or dislocation of the left ankle. There is mild medial ankle arthrosis. Small plantar and Achilles calcaneal spurs. Soft tissues are unremarkable. IMPRESSION: No fracture or dislocation of the left ankle. There is mild medial ankle arthrosis. Small plantar and Achilles calcaneal spurs. Soft tissues are unremarkable. Electronically Signed   By: Eddie Candle M.D.   On: 12/16/2018 12:13   Recent Labs    12/15/18 0410  WBC 13.5*  HGB 10.0*  HCT 31.6*  PLT 398   Recent Labs    12/15/18 0410 12/16/18 0537  NA 134* 135  K 3.8 3.9  CL 100 101  CO2 23 23  GLUCOSE 118* 128*  BUN 29* 22  CREATININE 1.43* 1.12  CALCIUM 8.0* 8.2*    Intake/Output Summary (Last 24 hours) at 12/17/2018 1025 Last data filed at 12/17/2018 0903 Gross per 24 hour  Intake 720 ml  Output 150 ml  Net 570 ml     Physical Exam: Vital Signs Blood pressure 132/75, pulse 92, temperature 98.6 F (37 C), resp. rate 18, height 5\' 8"  (1.727 m), weight 82.8 kg, SpO2 99 %.  Constitutional: No distress . Vital signs reviewed. HEENT: EOMI, oral membranes moist Neck: supple Cardiovascular: RRR without murmur. No JVD    Respiratory: CTA Bilaterally without wheezes or rales. Normal effort    GI: BS +, non-tender, non-distended  Extremities: No clubbing, cyanosis, or edema Skin: Sternotomy incision CDI.  sutures epigastric area Neurologic: Cranial nerves II through XII intact, motor strength is 5/5  in bilateral deltoid, bicep, tricep, grip. 3/5 hip flexor, knee extensors. 4/5 ankle dorsiflexor and plantar flexor   Musculoskeletal: left ankle less tender with PROM and palpation today along lateral aspect   Assessment/Plan: 1. Functional deficits secondary to Debility following CABG  which require 3+ hours per day of interdisciplinary therapy in a comprehensive inpatient rehab setting.  Physiatrist is providing close team supervision and 24 hour management of active medical problems listed below.  Physiatrist and rehab team continue to assess barriers to discharge/monitor patient progress toward functional and medical goals  Care Tool:  Bathing  Bathing activity did not occur: Refused Body parts bathed by patient: Right arm, Left arm, Chest, Abdomen, Front perineal area, Buttocks, Right upper leg, Left upper leg, Face   Body parts bathed by helper: Right lower leg, Left lower leg     Bathing assist Assist Level: Minimal Assistance - Patient > 75%     Upper Body Dressing/Undressing Upper body dressing   What is the patient wearing?: Pull over shirt    Upper body assist Assist Level: Moderate Assistance - Patient 50 - 74%    Lower Body Dressing/Undressing Lower body dressing      What is the patient wearing?: Pants     Lower body assist Assist for lower body dressing: Moderate Assistance - Patient 50 - 74%     Toileting Toileting    Toileting assist Assist for toileting: Maximal Assistance - Patient 25 - 49%     Transfers Chair/bed  transfer  Transfers assist     Chair/bed transfer assist level: Minimal Assistance - Patient > 75%     Locomotion Ambulation   Ambulation assist      Assist level: Minimal Assistance - Patient > 75% Assistive device: No Device Max distance: 75'   Walk 10 feet activity   Assist     Assist level: Minimal Assistance - Patient > 75% Assistive device: No Device   Walk 50 feet activity   Assist    Assist level:  Minimal Assistance - Patient > 75% Assistive device: No Device    Walk 150 feet activity   Assist Walk 150 feet activity did not occur: Safety/medical concerns         Walk 10 feet on uneven surface  activity   Assist     Assist level: Minimal Assistance - Patient > 75% Assistive device: Hand held assist   Wheelchair     Assist Will patient use wheelchair at discharge?: (TBD but anticipate pt will be functional ambulator)             Wheelchair 50 feet with 2 turns activity    Assist            Wheelchair 150 feet activity     Assist          Blood pressure 132/75, pulse 92, temperature 98.6 F (37 C), resp. rate 18, height 5\' 8"  (1.727 m), weight 82.8 kg, SpO2 99 %.    Medical Problem List and Plan:  1. Debility secondary to CAD with non-STEMI status post CABG with MVR 11/30/2018 with postoperative mediastinal bleeding/cardiogenic shock/acute respiratory failure with removal of Impella left ventricular assistive device evacuation of left hemothorax 12/04/2018. Sternal precautions   -continue CIR PT, OT  2. Antithrombotics:  -DVT/anticoagulation: Lovenox. Monitor for any bleeding episodes  -antiplatelet therapy: Aspirin 325 mg daily  3. Pain Management: Tramadol as needed   -xrays demonstrate mild OA, osteophytes likely accounting for pain as he got ambulating here again on rehab after prolonged bedrest.  -continue voltaren gel, supportive measures 4. Mood: Provide emotional support  -antipsychotic agents: N/A  5. Neuropsych: This patient is? Fully capable of making decisions on his own behalf.  6. Skin/Wound Care: Routine skin checks   -removed staples from neck/chest  -continue abdominal sutures from LVAD removal 7. Fluids/Electrolytes/Nutrition: Routine in and outs.   --resumed demadex 10/14  -recheck labs tomorrow 8. Acute blood loss anemia. CBC ordered  9. Hypertension. Entresto 24-26 mg twice daily, Aldactone 25 mg daily, Demadex  20 mg daily reduced to 10mg ---held 10/12.  Resumed Vitals:   12/16/18 1949 12/17/18 0318  BP: 122/65 132/75  Pulse: 90 92  Resp: 18 18  Temp: 98.7 F (37.1 C) 98.6 F (37 C)  SpO2: 98% 99%  Controlled 10/14 10. PAF. Amiodarone 200 mg daily. Cardiac rate controlled. Monitor with increased physical exertion.  11. Leukocytosis. recurrent. Urine culture blood cultures no growth-final  - Empiric vancomycin and cefepime discontinued. Repeat CBC reveals ongoing leukocytosis 16k 10/12---improved to 13k today  -continue to monitor serially.   -afebrile, wounds clean 12. Prediabetes. Levemir 5 units every 12 hours---already stopped  -add oral agent if needed.  -check cbg's tid with meals as amsugars have been borderline to high CBG (last 3)  Recent Labs    12/16/18 0620 12/16/18 1225 12/17/18 0607  GLUCAP 125* 133* 120*   13. Combined CHF  EF of 25-30%   Daily weights , intake poor and BP soft, demadex resumed -weight  appears down overall although readings have been inconsistent Filed Weights   12/15/18 0500 12/16/18 0500 12/17/18 0318  Weight: 81.8 kg 82.3 kg 82.8 kg       LOS: 6 days A FACE TO FACE EVALUATION WAS PERFORMED  Ranelle OysterZachary T Mihaela Fajardo 12/17/2018, 10:25 AM

## 2018-12-17 NOTE — Progress Notes (Signed)
Physical Therapy Session Note  Patient Details  Name: Curtis [name] MRN: [id number] Date of Birth: Aug 18, 1950  Today's Date: 12/17/2018 PT Individual Time: 1535-1605 PT Individual Time Calculation (min): 30 min   Short Term Goals: Week 1:  PT Short Term Goal 1 (Week 1): Pt will perform supine<>sit with CGA PT Short Term Goal 2 (Week 1): Pt will perform bed<>chair transfers wiht CGA PT Short Term Goal 3 (Week 1): Pt will ambulate at least 58ft using LRAD with CGA PT Short Term Goal 4 (Week 1): Pt will ascend/descend 8 steps using B HRs with CGA      Skilled Therapeutic Interventions/Progress Updates:   Pt resting in bed; he denied pain.  He was oriented to day, date, location, situation.  Bed mobility with cues for technique to sit up on R EOB.  Pt c/o mild dizziness, which resolved in <1 min.  Sit> stand from raised bed with supervision.  Therapeutic exercises performed with LEs to increase strength for functional mobility: standing at end of bed with bil light support on foot board:  1 x 5 heel/toe raises, 1 x 7 mini squats, 1 x 10 each R/L hip abduction.  # of reps limited by fatigue; seated rest break needed between set.  Gait training with RW on level tile, multiple turns, x 80' with CGA.  After resting x 2 min, gait x 40' to return to room.  Pt willing to stay up in w/c.  Soft call bell and TV/call bell placed in his lap.  Pt able to demonstrate use of soft call bell PRN.  Seat belt alarm set.     Therapy Documentation Precautions:  Precautions Precautions: Fall, Sternal Restrictions Weight Bearing Restrictions: Yes(sternal precaution) Other Position/Activity Restrictions: sternal precautions         Therapy/Group: Individual Therapy  Rosalynd Mcwright 12/17/2018, 4:13 PM

## 2018-12-17 NOTE — Progress Notes (Signed)
Occupational Therapy Session Note  Patient Details  Name: Curtis [name] MRN: [id number] Date of Birth: Jul 25, 1950  Today's Date: 12/17/2018 OT Individual Time: 1330-1400 OT Individual Time Calculation (min): 30 min    Short Term Goals: Week 1:  OT Short Term Goal 1 (Week 1): Pt will be able to stand up following sternal precautions with CGA. OT Short Term Goal 2 (Week 1): Pt will be able to transfer to toilet following sternal precautions with CGA. OT Short Term Goal 3 (Week 1): Pt will bathe LB with CGA. OT Short Term Goal 4 (Week 1): Pt will dress LB with CGA.  Skilled Therapeutic Interventions/Progress Updates:    1:1 Pt in bed resting when arrived. Practiced bed mobility from flat bed and no bed rails. Pt able to rolling with supervision and come up from slidelying to sitting with min A with extra time. Focus on sit to stands from EOB without Ue support and controlled descents. Pt ambulated over to sink and trying to donn shaving cream on his face in standing but needed to sit due to fatigue. Assisted pt with shaving face. Pt ambulated over to recliner with min A without AD with directional cues due to low vision.    Therapy Documentation Precautions:  Precautions Precautions: Fall, Sternal Restrictions Weight Bearing Restrictions: Yes(sternal precaution) Other Position/Activity Restrictions: sternal precautions Pain: Pain Assessment Pain Scale: 0-10 Pain Score: 0-No pain   Therapy/Group: Individual Therapy  Willeen Cass [facility] 12/17/2018, 3:11 PM

## 2018-12-17 NOTE — Plan of Care (Signed)
  Problem: Consults Goal: RH GENERAL PATIENT EDUCATION Description: See Patient Education module for education specifics. Outcome: Progressing Goal: Skin Care Protocol Initiated - if Braden Score 18 or less Description: If consults are not indicated, leave blank or document N/A Outcome: Progressing Goal: Nutrition Consult-if indicated Outcome: Progressing Goal: Diabetes Guidelines if Diabetic/Glucose > 140 Description: If diabetic or lab glucose is > 140 mg/dl - Initiate Diabetes/Hyperglycemia Guidelines & Document Interventions  Outcome: Progressing   Problem: RH BLADDER ELIMINATION Goal: RH STG MANAGE BLADDER WITH ASSISTANCE Description: STG Manage Bladder With min Assistance Outcome: Progressing   Problem: RH SKIN INTEGRITY Goal: RH STG SKIN FREE OF INFECTION/BREAKDOWN Description: No skin breakdown or wound infection while in rehab with min assist Outcome: Progressing Goal: RH STG MAINTAIN SKIN INTEGRITY WITH ASSISTANCE Description: STG Maintain Skin Integrity With min Assistance. Outcome: Progressing Goal: RH STG ABLE TO PERFORM INCISION/WOUND CARE W/ASSISTANCE Description: STG Able To Perform Incision/Wound Care With min  assistance. Outcome: Progressing   Problem: RH SAFETY Goal: RH STG ADHERE TO SAFETY PRECAUTIONS W/ASSISTANCE/DEVICE Description: STG Adhere to Safety Precautions With min Assistance/Device. Outcome: Progressing Goal: RH STG DECREASED RISK OF FALL WITH ASSISTANCE Description: STG Decreased Risk of Fall With min assistance. Outcome: Progressing   Problem: RH PAIN MANAGEMENT Goal: RH STG PAIN MANAGED AT OR BELOW PT'S PAIN GOAL Description: Pain <3 Outcome: Progressing   Problem: RH KNOWLEDGE DEFICIT GENERAL Goal: RH STG INCREASE KNOWLEDGE OF SELF CARE AFTER HOSPITALIZATION Description: Information on self care after hospitalization will increase/improve with min assist Outcome: Progressing   

## 2018-12-18 ENCOUNTER — Inpatient Hospital Stay (HOSPITAL_COMMUNITY): Payer: Medicare Other | Admitting: Speech Pathology

## 2018-12-18 ENCOUNTER — Inpatient Hospital Stay (HOSPITAL_COMMUNITY): Payer: Medicare Other | Admitting: Occupational Therapy

## 2018-12-18 ENCOUNTER — Inpatient Hospital Stay (HOSPITAL_COMMUNITY): Payer: Medicare Other | Admitting: Physical Therapy

## 2018-12-18 LAB — GLUCOSE, CAPILLARY
Glucose-Capillary: 126 mg/dL — ABNORMAL HIGH (ref 70–99)
Glucose-Capillary: 148 mg/dL — ABNORMAL HIGH (ref 70–99)
Glucose-Capillary: 123 mg/dL — ABNORMAL HIGH (ref 70–99)

## 2018-12-18 LAB — BASIC METABOLIC PANEL
Anion gap: 8 (ref 5–15)
BUN: 19 mg/dL (ref 8–23)
CO2: 24 mmol/L (ref 22–32)
Calcium: 8.2 mg/dL — ABNORMAL LOW (ref 8.9–10.3)
Chloride: 101 mmol/L (ref 98–111)
Creatinine, Ser: 1.28 mg/dL — ABNORMAL HIGH (ref 0.61–1.24)
GFR calc Af Amer: >60 mL/min (ref >60)
GFR calc non Af Amer: 58 mL/min — ABNORMAL LOW (ref >60)
Glucose, Bld: 122 mg/dL — ABNORMAL HIGH (ref 70–99)
Potassium: 4.2 mmol/L (ref 3.5–5.1)
Sodium: 133 mmol/L — ABNORMAL LOW (ref 135–145)

## 2018-12-18 NOTE — Plan of Care (Signed)
  Problem: Consults Goal: RH GENERAL PATIENT EDUCATION Description: See Patient Education module for education specifics. Outcome: Progressing Goal: Skin Care Protocol Initiated - if Braden Score 18 or less Description: If consults are not indicated, leave blank or document N/A Outcome: Progressing Goal: Nutrition Consult-if indicated Outcome: Progressing Goal: Diabetes Guidelines if Diabetic/Glucose > 140 Description: If diabetic or lab glucose is > 140 mg/dl - Initiate Diabetes/Hyperglycemia Guidelines & Document Interventions  Outcome: Progressing   Problem: RH BLADDER ELIMINATION Goal: RH STG MANAGE BLADDER WITH ASSISTANCE Description: STG Manage Bladder With min Assistance Outcome: Progressing   Problem: RH SKIN INTEGRITY Goal: RH STG SKIN FREE OF INFECTION/BREAKDOWN Description: No skin breakdown or wound infection while in rehab with min assist Outcome: Progressing Goal: RH STG MAINTAIN SKIN INTEGRITY WITH ASSISTANCE Description: STG Maintain Skin Integrity With min Assistance. Outcome: Progressing Goal: RH STG ABLE TO PERFORM INCISION/WOUND CARE W/ASSISTANCE Description: STG Able To Perform Incision/Wound Care With min  assistance. Outcome: Progressing   Problem: RH SAFETY Goal: RH STG ADHERE TO SAFETY PRECAUTIONS W/ASSISTANCE/DEVICE Description: STG Adhere to Safety Precautions With min Assistance/Device. Outcome: Progressing Goal: RH STG DECREASED RISK OF FALL WITH ASSISTANCE Description: STG Decreased Risk of Fall With min assistance. Outcome: Progressing   Problem: RH PAIN MANAGEMENT Goal: RH STG PAIN MANAGED AT OR BELOW PT'S PAIN GOAL Description: Pain <3 Outcome: Progressing   Problem: RH KNOWLEDGE DEFICIT GENERAL Goal: RH STG INCREASE KNOWLEDGE OF SELF CARE AFTER HOSPITALIZATION Description: Information on self care after hospitalization will increase/improve with min assist Outcome: Progressing

## 2018-12-18 NOTE — Progress Notes (Signed)
Occupational Therapy Session Note  Patient Details  Name: Curtis [name] MRN: [id number] Date of Birth: 04-25-1950  Today's Date: 12/18/2018 OT Individual Time: 1130-1156 OT Individual Time Calculation (min): 26 min    Short Term Goals: Week 1:  OT Short Term Goal 1 (Week 1): Pt will be able to stand up following sternal precautions with CGA. OT Short Term Goal 1 - Progress (Week 1): Met OT Short Term Goal 2 (Week 1): Pt will be able to transfer to toilet following sternal precautions with CGA. OT Short Term Goal 2 - Progress (Week 1): Met OT Short Term Goal 3 (Week 1): Pt will bathe LB with CGA. OT Short Term Goal 3 - Progress (Week 1): Met OT Short Term Goal 4 (Week 1): Pt will dress LB with CGA. OT Short Term Goal 4 - Progress (Week 1): Met  Skilled Therapeutic Interventions/Progress Updates:    Patient seat in w/c, denies pain, assisted with dialing phone so that he could place a dietary order.  Sit to stand and ambulation with RW approx 80' with CGA.  Completed standing balance tasks 2 x 2 minutes each - fatigue limiting.  Patient insisted on returning to bed due to fatigue with CGA, CS for bed mobility with cues for safety.  Completed UB ROM exercise in supine position below 90 degrees with c/o fatigue.  He remained in bed at close of session with bed alarm set and call bell in reach.    Therapy Documentation Precautions:  Precautions Precautions: Fall, Sternal Restrictions Weight Bearing Restrictions: Yes(sternal precaution) Other Position/Activity Restrictions: sternal precautions General:   Vital Signs:   Pain: Pain Assessment Pain Scale: 0-10 Pain Score: 0-No pain   Other Treatments:     Therapy/Group: Individual Therapy  [name] 12/18/2018, 12:16 PM

## 2018-12-18 NOTE — Progress Notes (Signed)
Speech Language Pathology Weekly Progress and Session Note  Patient Details  Name: Curtis [name] MRN: [id number] Date of Birth: [birth year]  Beginning of progress report period: December 11, 2018 End of progress report period: December 18, 2018  Today's Date: 12/18/2018 SLP Individual Time: 9038-3338 SLP Individual Time Calculation (min): 40 min  Short Term Goals: Week 1: SLP Short Term Goal 1 (Week 1): Given Min A cues, pt will recall sternal precautions in 8 out of 10 opportunities. SLP Short Term Goal 1 - Progress (Week 1): Met SLP Short Term Goal 2 (Week 1): Given Min A cues, pt will utilize sternal precautions in 8 out of 10 opportunities. SLP Short Term Goal 2 - Progress (Week 1): Not met SLP Short Term Goal 3 (Week 1): Pt will demosntrate selective attention for 30 minutes with Min A cues. SLP Short Term Goal 3 - Progress (Week 1): Met SLP Short Term Goal 4 (Week 1): Pt will demonstrate intellectual awareness by identifying 3 physical and cognitive deficits with Min A cues. SLP Short Term Goal 4 - Progress (Week 1): Met SLP Short Term Goal 5 (Week 1): Pt will ocmplete semi-complex problem solving tasks with Min A cues. SLP Short Term Goal 5 - Progress (Week 1): Met SLP Short Term Goal 6 (Week 1): Pt will utilize compensatory memory strategies to recall information with Min A cues in 8 out of 10 opportunities. SLP Short Term Goal 6 - Progress (Week 1): Met    New Short Term Goals: Week 2: SLP Short Term Goal 1 (Week 2): STGs=LTGs due to ELOS  Weekly Progress Updates: Patient has made functional gains and has met 5 of 6 STGs this reporting period. Currently, patient can recall his sternal precautions with overall Min A verbal cues but requires Mod A for utilization during functional tasks. Patient also demonstrates improved awareness of deficits but requires continued education on how his deficits, especially in regards to his low vision will impact his function at home. Min A verbal cues  are required for recall of daily information, attention and basic problem solving with functional and familiar tasks.  Patient education ongoing. Patient would benefit from continued skilled SLP intervention to maximize his cognitive functioning and overall functional independence prior discharge.    Intensity: Minumum of 1-2 x/day, 30 to 90 minutes Frequency: 3 to 5 out of 7 days Duration/Length of Stay: 12/25/18 Treatment/Interventions: Cognitive remediation/compensation;Internal/external aids;Patient/family education;Therapeutic Activities;Functional tasks;Environmental controls;Cueing hierarchy   Daily Session  Skilled Therapeutic Interventions: Skilled treatment session focused on cognitive goals. SLP facilitated session by providing Min A verbal cues for patient to recall sternal precautions and for recall of events from previous therapy sessions.  Patient also demonstrated appropriate intellectual awareness of his physical deficits but continues to require Min verbal cues for awareness of cognitive deficits as well as anticipatory awareness in regards to d/c planning. Patient left upright in wheelchair with alarm on and all needs within reach. Continue with current plan of care.      Pain No/Denies Pain   Therapy/Group: Individual Therapy  Frannie Shedrick 12/18/2018, 6:24 AM

## 2018-12-18 NOTE — Progress Notes (Signed)
Occupational Therapy Weekly Progress Note  Patient Details  Name: Curtis [name] MRN: [id number] Date of Birth: [birth year]  Beginning of progress report period: December 12, 2018 End of progress report period: December 18, 2018  Today's Date: 12/18/2018 OT Individual Time: 2993-7169 OT Individual Time Calculation (min): 60 min    Patient has met 4 of 4 short term goals.  Pt is making steady progress towards OT goals. Pt is at an overall min A level within BADL tasks with verbal cues to maintain sternal precautions. Pt still requires multiple rest breaks with any standing activities. Pt needs min A for toileting and CGA for transfers. Continue current plan of care.   Patient continues to demonstrate the following deficits: muscle weakness, decreased cardiorespiratoy endurance, low vision strategies, decreased problem solving, decreased safety awareness and decreased memory and decreased standing balance, decreased postural control, decreased balance strategies and difficulty maintaining precautions and therefore will continue to benefit from skilled OT intervention to enhance overall performance with BADL.  Patient progressing toward long term goals..  Continue plan of care.  OT Short Term Goals Week 1:  OT Short Term Goal 1 (Week 1): Pt will be able to stand up following sternal precautions with CGA. OT Short Term Goal 1 - Progress (Week 1): Met OT Short Term Goal 2 (Week 1): Pt will be able to transfer to toilet following sternal precautions with CGA. OT Short Term Goal 2 - Progress (Week 1): Met OT Short Term Goal 3 (Week 1): Pt will bathe LB with CGA. OT Short Term Goal 3 - Progress (Week 1): Met OT Short Term Goal 4 (Week 1): Pt will dress LB with CGA. OT Short Term Goal 4 - Progress (Week 1): Met Week 2:  OT Short Term Goal 1 (Week 2): STG=LTG 2/2 ELOS  Skilled Therapeutic Interventions/Progress Updates:    Pt greeted semi-reclined in bed and agreeable to OT treatment session. Pt needed  min A and verbal cues to maintain sternal precautions within bed mobility. Pt reported need to go to the bathroom. Sit<>stand with RW and verbal cues not to pull up on RW and to maintain sternal precautions. Pt ambulated into bathroom and transferred onto toilet with CGA. Pt voided bladder and had large BM. Worked on toileting strategies with  OT providing set-up and CGA for balance in standing while pt washed peri-area and buttocks. Pt needed to sit to rest in order to complete toileting. Pt ambulated out of bathroom in similar fashion but needed min A to safely turn and sit onto wc as pt was rushing 2/2 fatigue. Pt took extended rest break, then finished bathing/dressing at the sink with min A. OT provided pt with shoe horn and he was able to don shoes with min A today. Pt ate breakfast with verbal cues to locate utensils and food items on tray. Pt reports he fed himself at home and would just feel on the plate to locate food. Pt reports his mother does all of the cooking. Pt left seated in wc with alarm belt on, call bell in reach and needs met.   Therapy Documentation Precautions:  Precautions Precautions: Fall, Sternal Restrictions Weight Bearing Restrictions: Yes(sternal precaution) Other Position/Activity Restrictions: sternal precautions Pain:   denies pain at this time  Therapy/Group: Individual Therapy  Valma Cava 12/18/2018, 9:04 AM

## 2018-12-18 NOTE — Progress Notes (Signed)
[place] PHYSICAL MEDICINE & REHABILITATION PROGRESS NOTE   Subjective/Complaints:  Feels that he's making progress. Sleeping fairly well. Left ankle much improved  ROS: Patient denies fever, rash, sore throat, blurred vision, nausea, vomiting, diarrhea, cough, shortness of breath or chest pain, joint or back pain, headache, or mood change.   Objective:   No results found. No results for input(s): WBC, HGB, HCT, PLT in the last 72 hours. Recent Labs    12/16/18 0537 12/18/18 0633  NA 135 133*  K 3.9 4.2  CL 101 101  CO2 23 24  GLUCOSE 128* 122*  BUN 22 19  CREATININE 1.12 1.28*  CALCIUM 8.2* 8.2*    Intake/Output Summary (Last 24 hours) at 12/18/2018 1021 Last data filed at 12/18/2018 0946 Gross per 24 hour  Intake 720 ml  Output -  Net 720 ml     Physical Exam: Vital Signs Blood pressure 126/69, pulse 90, temperature 97.7 F (36.5 C), temperature source Oral, resp. rate 18, height 5\' 8"  (1.727 m), weight 82.3 kg, SpO2 99 %.  Constitutional: No distress . Vital signs reviewed. HEENT: EOMI, oral membranes moist Neck: supple Cardiovascular: RRR without murmur. No JVD    Respiratory: CTA Bilaterally without wheezes or rales. Normal effort    GI: BS +, non-tender, non-distended  Extremities: No clubbing, cyanosis, or edema Skin: Sternotomy incision CDI.  sutures epigastric area in place Neurologic: Cranial nerves II through XII intact, motor strength is 5/5 in bilateral deltoid, bicep, tricep, grip. 3+/5 hip flexor, knee extensors. 4/5 ankle dorsiflexor and plantar flexor   Musculoskeletal: minimal ankle tenderness today  Assessment/Plan: 1. Functional deficits secondary to Debility following CABG  which require 3+ hours per day of interdisciplinary therapy in a comprehensive inpatient rehab setting.  Physiatrist is providing close team supervision and 24 hour management of active medical problems listed below.  Physiatrist and rehab team continue to assess  barriers to discharge/monitor patient progress toward functional and medical goals  Care Tool:  Bathing  Bathing activity did not occur: Refused Body parts bathed by patient: Right arm, Left arm, Chest, Abdomen, Front perineal area, Buttocks, Right upper leg, Left upper leg, Face   Body parts bathed by helper: Right lower leg, Left lower leg     Bathing assist Assist Level: Minimal Assistance - Patient > 75%     Upper Body Dressing/Undressing Upper body dressing   What is the patient wearing?: Pull over shirt    Upper body assist Assist Level: Supervision/Verbal cueing    Lower Body Dressing/Undressing Lower body dressing      What is the patient wearing?: Pants     Lower body assist Assist for lower body dressing: Minimal Assistance - Patient > 75%     Toileting Toileting    Toileting assist Assist for toileting: Minimal Assistance - Patient > 75%     Transfers Chair/bed transfer  Transfers assist     Chair/bed transfer assist level: Contact Guard/Touching assist     Locomotion Ambulation   Ambulation assist      Assist level: Contact Guard/Touching assist Assistive device: Walker-rolling Max distance: 80   Walk 10 feet activity   Assist     Assist level: Contact Guard/Touching assist Assistive device: Walker-rolling   Walk 50 feet activity   Assist    Assist level: Contact Guard/Touching assist Assistive device: Walker-rolling    Walk 150 feet activity   Assist Walk 150 feet activity did not occur: Safety/medical concerns  Walk 10 feet on uneven surface  activity   Assist     Assist level: Minimal Assistance - Patient > 75% Assistive device: Hand held assist   Wheelchair     Assist Will patient use wheelchair at discharge?: (TBD but anticipate pt will be functional ambulator)             Wheelchair 50 feet with 2 turns activity    Assist            Wheelchair 150 feet activity      Assist          Blood pressure 126/69, pulse 90, temperature 97.7 F (36.5 C), temperature source Oral, resp. rate 18, height 5\' 8"  (1.727 m), weight 82.3 kg, SpO2 99 %.    Medical Problem List and Plan:  1. Debility secondary to CAD with non-STEMI status post CABG with MVR 11/30/2018 with postoperative mediastinal bleeding/cardiogenic shock/acute respiratory failure with removal of Impella left ventricular assistive device evacuation of left hemothorax 12/04/2018. Sternal precautions   -continue CIR PT, OT   -making gains in stamina 2. Antithrombotics:  -DVT/anticoagulation: Lovenox. Monitor for any bleeding episodes  -antiplatelet therapy: Aspirin 325 mg daily  3. Pain Management: Tramadol as needed   -xrays demonstrate mild OA, osteophytes likely accounting for pain as he got ambulating here again on rehab after prolonged bedrest.  -continue voltaren gel, supportive measures 4. Mood: Provide emotional support  -antipsychotic agents: N/A  5. Neuropsych: This patient is? Fully capable of making decisions on his own behalf.  6. Skin/Wound Care: Routine skin checks   -removed staples from neck/chest  -continue abdominal sutures from LVAD removal 7. Fluids/Electrolytes/Nutrition: Routine in and outs.   --resumed demadex 10/14  -Cr sl increased today but labs essentially stable  -recheck bmet monday 8. Acute blood loss anemia. CBC ordered  9. Hypertension. Entresto 24-26 mg twice daily, Aldactone 25 mg daily, Demadex 20 mg daily reduced to 10mg ---held 10/12 for one dose.    Vitals:   12/17/18 1955 12/18/18 0336  BP: 117/69 126/69  Pulse: 94 90  Resp: 18 18  Temp: 98.3 F (36.8 C) 97.7 F (36.5 C)  SpO2: 100% 99%  Controlled 10/16 10. PAF. Amiodarone 200 mg daily. Cardiac rate controlled. Monitor with increased physical exertion.  11. Leukocytosis. recurrent. Urine culture blood cultures no growth-final  - Empiric vancomycin and cefepime discontinued. Repeat CBC  reveals ongoing leukocytosis 16k 10/12---improved to 13k today  -continue to monitor serially.   -afebrile, wounds clean 12. Prediabetes. Levemir 5 units   stopped  -add oral agent if needed.  -checking cbg's tid with meals for now.   -if numbers remain reasonable this weekend, will dc checks Monday CBG (last 3)  Recent Labs    12/17/18 1208 12/17/18 1706 12/18/18 0635  GLUCAP 127* 129* 126*   13. Combined CHF  EF of 25-30%   Daily weights   -weight appears stable Filed Weights   12/16/18 0500 12/17/18 0318 12/18/18 0500  Weight: 82.3 kg 82.8 kg 82.3 kg   -may need to be sl "dry" to maintain ideal weight  -continue diuretics as above    LOS: 7 days A FACE TO FACE EVALUATION WAS PERFORMED  12/19/18 12/18/2018, 10:21 AM

## 2018-12-18 NOTE — Progress Notes (Signed)
Physical Therapy Weekly Progress Note  Patient Details  Name: Curtis [name] MRN: [id number] Date of Birth: 12-Dec-1950  Beginning of progress report period: December 12, 2018 End of progress report period: December 18, 2018  Today's Date: 12/18/2018 PT Individual Time: 1300-1345 PT Individual Time Calculation (min): 45 min   Patient has met 4 of 4 short term goals. Pt has made excellent progress towards LTGs. He is performing all mobility w/ CGA-min assist w/ the most difficult aspects being sternal precautions preventing heavy UE assist w/ bed mobility and sit<>stands. He is ambulating 50-100' w/ CGA using RW and negotiating 8 steps at a time w/ CGA. He is most limited by endurance deficits, HR and O2 saturation remain WNL w/ all mobility.   Patient continues to demonstrate the following deficits muscle weakness, decreased cardiorespiratoy endurance, decreased visual acuity (baseline) and decreased visual perceptual skills (baseline) and decreased standing balance, decreased postural control, decreased balance strategies and difficulty maintaining precautions and therefore will continue to benefit from skilled PT intervention to increase functional independence with mobility.  Patient progressing toward long term goals..  Continue plan of care.  PT Short Term Goals Week 1:  PT Short Term Goal 1 (Week 1): Pt will perform supine<>sit w/ CGA PT Short Term Goal 1 - Progress (Week 1): Met PT Short Term Goal 2 (Week 1): Pt will perform bed<>chair with CGA PT Short Term Goal 2 - Progress (Week 1): Met PT Short Term Goal 3 (Week 1): Pt will ambulate at least 68' using LRAD w/ CGA PT Short Term Goal 3 - Progress (Week 1): Met PT Short Term Goal 4 (Week 1): Pt will ascend/descend 8 steps using B HRs w/ CGA PT Short Term Goal 4 - Progress (Week 1): Met Week 2:  PT Short Term Goal 1 (Week 2): =LTGs due to ELOS  Skilled Therapeutic Interventions/Progress Updates:   Pt in supine and agreeable to therapy,  denies pain. Supine>sit w/ CGA and verbal cues for log-roll. Ambulated to/from dayroom w/ CGA using RW, 100' each way. NuStep @ level 2, 4 min, 3 min, 2 min, and 1 min. Used BLEs only to work on functional LE strengthening as well as Engineer, production. HR 93-100 and O2 sat >99% on RA throughout. Pt reported NuStep being 6-7/10 on modified RPE scale, verbal reminders for breathing strategies and ongoing education regarding energy conservation strategies. Needed prolonged rest breaks in between all activities this session 2/2 moderate increase in work of breathing. Attempted to ambulate w/o AD to work on balance w/ gait, however pt requesting to return to room early. Pt stated "I need to go back to bed". Vitals remained WNL as detailed above. Returned to room and ended session in supine, all needs in reach. Missed 15 min of skilled PT 2/2 fatigue.   Therapy Documentation Precautions:  Precautions Precautions: Fall, Sternal Restrictions Weight Bearing Restrictions: Yes(sternal precaution) Other Position/Activity Restrictions: sternal precautions Pain:     Therapy/Group: Individual Therapy  Deagan Sevin K Manav Pierotti 12/18/2018, 5:14 PM

## 2018-12-19 ENCOUNTER — Inpatient Hospital Stay (HOSPITAL_COMMUNITY): Payer: Medicare Other | Admitting: Speech Pathology

## 2018-12-19 DIAGNOSIS — E871 Hypo-osmolality and hyponatremia: Secondary | ICD-10-CM

## 2018-12-19 DIAGNOSIS — R7303 Prediabetes: Secondary | ICD-10-CM

## 2018-12-19 DIAGNOSIS — D72829 Elevated white blood cell count, unspecified: Secondary | ICD-10-CM

## 2018-12-19 DIAGNOSIS — R7309 Other abnormal glucose: Secondary | ICD-10-CM

## 2018-12-19 DIAGNOSIS — I10 Essential (primary) hypertension: Secondary | ICD-10-CM

## 2018-12-19 DIAGNOSIS — I5043 Acute on chronic combined systolic (congestive) and diastolic (congestive) heart failure: Secondary | ICD-10-CM

## 2018-12-19 DIAGNOSIS — R7989 Other specified abnormal findings of blood chemistry: Secondary | ICD-10-CM

## 2018-12-19 LAB — GLUCOSE, CAPILLARY
Glucose-Capillary: 123 mg/dL — ABNORMAL HIGH (ref 70–99)
Glucose-Capillary: 191 mg/dL — ABNORMAL HIGH (ref 70–99)
Glucose-Capillary: 105 mg/dL — ABNORMAL HIGH (ref 70–99)

## 2018-12-19 NOTE — Progress Notes (Signed)
Dinner tray: [place] ice, cookie, and ginger ale.

## 2018-12-19 NOTE — Progress Notes (Addendum)
Speech Language Pathology Daily Session Note  Patient Details  Name: Curtis [name] MRN: [id number] Date of Birth: 1950/04/12  Today's Date: 12/19/2018 SLP Individual Time: 1400-1443 SLP Individual Time Calculation (min): 43 min  Short Term Goals: Week 2: SLP Short Term Goal 1 (Week 2): STGs=LTGs due to ELOS  Skilled Therapeutic Interventions: Pt was seen for skilled ST targeting cognitive goals. Pt was independently OX4, and in functional conversation he verbally recalled 4 sternal precautions Mod I. Although awareness deficits still present, he did demonstrate appropriate safety awareness in regards to mobility, evidenced in his statement he needs someone to walk closely behind him and always use a walker when ambulating. With Supervision A question cues, he also identified his tendency to get ahead of himself and overestimate abilities as reasoning for getting out a breath and posing potential safety concerns. Multiple choice cues required for anticipatory awareness of 1 ADL that would be unsafe to complete without assistance once d/c home. SLP further facilitated session with verbal review of current medications. Pt independently recalled 1 medication function, 1 medication dosage, and 1 medication name + function. Max-Total A required to recall remaining mediation name, function, and dosages. Pt was left laying in bed with alarm set and all needs within reach. Continue per current plan of care.       Pain Pain Assessment Pain Score: 0-No pain  Therapy/Group: Individual Therapy  Arbutus Leas 12/19/2018, 2:54 PM

## 2018-12-19 NOTE — Progress Notes (Signed)
[place] PHYSICAL MEDICINE & REHABILITATION PROGRESS NOTE   Subjective/Complaints: Patient seen laying in bed this morning.  He states he slept well overnight.  He states he is still sleepy and would like to stay tomorrow this morning.  ROS: Denies CP, SOB, N/V/D  Objective:   No results found. No results for input(s): WBC, HGB, HCT, PLT in the last 72 hours. Recent Labs    12/18/18 0633  NA 133*  K 4.2  CL 101  CO2 24  GLUCOSE 122*  BUN 19  CREATININE 1.28*  CALCIUM 8.2*    Intake/Output Summary (Last 24 hours) at 12/19/2018 1351 Last data filed at 12/19/2018 1254 Gross per 24 hour  Intake 780 ml  Output 100 ml  Net 680 ml     Physical Exam: Vital Signs Blood pressure 131/64, pulse 86, temperature 98.2 F (36.8 C), temperature source Oral, resp. rate 18, height 5\' 8"  (1.727 m), weight 82.5 kg, SpO2 96 %. Constitutional: No distress . Vital signs reviewed. HENT: Normocephalic.  Atraumatic. Eyes: EOMI. No discharge. Cardiovascular: No JVD. Respiratory: Normal effort.  No stridor. GI: Non-distended. Skin: Sternotomy C/D/I Psych: Normal mood.  Normal behavior. Musc: No edema in extremities.  No tenderness in extremities. Neurologic: Alert Motor: Bilateral upper extremities 5/5 proximal distal Bilateral lower extremities: Hip flexion, knee extension 4 -/5, ankle dorsiflexion 4/5   Assessment/Plan: 1. Functional deficits secondary to Debility following CABG  which require 3+ hours per day of interdisciplinary therapy in a comprehensive inpatient rehab setting.  Physiatrist is providing close team supervision and 24 hour management of active medical problems listed below.  Physiatrist and rehab team continue to assess barriers to discharge/monitor patient progress toward functional and medical goals  Care Tool:  Bathing  Bathing activity did not occur: Refused Body parts bathed by patient: Right arm, Left arm, Chest, Abdomen, Front perineal area, Buttocks,  Right upper leg, Left upper leg, Face   Body parts bathed by helper: Right lower leg, Left lower leg     Bathing assist Assist Level: Minimal Assistance - Patient > 75%     Upper Body Dressing/Undressing Upper body dressing   What is the patient wearing?: Pull over shirt    Upper body assist Assist Level: Supervision/Verbal cueing    Lower Body Dressing/Undressing Lower body dressing      What is the patient wearing?: Pants     Lower body assist Assist for lower body dressing: Minimal Assistance - Patient > 75%     Toileting Toileting    Toileting assist Assist for toileting: Minimal Assistance - Patient > 75%     Transfers Chair/bed transfer  Transfers assist     Chair/bed transfer assist level: Contact Guard/Touching assist     Locomotion Ambulation   Ambulation assist      Assist level: Contact Guard/Touching assist Assistive device: Walker-rolling Max distance: 100'   Walk 10 feet activity   Assist     Assist level: Contact Guard/Touching assist Assistive device: Walker-rolling   Walk 50 feet activity   Assist    Assist level: Contact Guard/Touching assist Assistive device: Walker-rolling    Walk 150 feet activity   Assist Walk 150 feet activity did not occur: Safety/medical concerns         Walk 10 feet on uneven surface  activity   Assist     Assist level: Minimal Assistance - Patient > 75% Assistive device: Hand held assist   Wheelchair     Assist Will patient use wheelchair at discharge?: (  TBD but anticipate pt will be functional ambulator)             Wheelchair 50 feet with 2 turns activity    Assist            Wheelchair 150 feet activity     Assist          Blood pressure 131/64, pulse 86, temperature 98.2 F (36.8 C), temperature source Oral, resp. rate 18, height 5\' 8"  (1.727 m), weight 82.5 kg, SpO2 96 %.    Medical Problem List and Plan:  1. Debility secondary to CAD with  non-STEMI status post CABG with MVR 11/30/2018 with postoperative mediastinal bleeding/cardiogenic shock/acute respiratory failure with removal of Impella left ventricular assistive device evacuation of left hemothorax 12/04/2018. Sternal precautions   Continue CIR 2. Antithrombotics:  -DVT/anticoagulation: Lovenox. Monitor for any bleeding episodes  -antiplatelet therapy: Aspirin 325 mg daily  3. Pain Management: Tramadol as needed   -xrays demonstrate mild OA, osteophytes   -continue voltaren gel, supportive measures 4. Mood: Provide emotional support  -antipsychotic agents: N/A  5. Neuropsych: This patient is? Fully capable of making decisions on his own behalf.  6. Skin/Wound Care: Routine skin checks   -removed staples from neck/chest  -continue abdominal sutures from LVAD removal 7. Fluids/Electrolytes/Nutrition: Routine in and outs.   --resumed demadex 10/14  -Cr 1.28 on 10/16, labs ordered for Monday 8. Acute blood loss anemia. CBC ordered  9. Hypertension. Entresto 24-26 mg twice daily, Aldactone 25 mg daily, Demadex 20 mg daily reduced to 10mg ---held 10/12 for one dose.    Vitals:   12/18/18 1943 12/19/18 0232  BP: 118/68 131/64  Pulse: 86 86  Resp: 16 18  Temp: 98.3 F (36.8 C) 98.2 F (36.8 C)  SpO2: 98% 96%   Controlled on 10/17 10. PAF. Amiodarone 200 mg daily. Cardiac rate controlled. Monitor with increased physical exertion.  11. Leukocytosis. recurrent.   WBCs 13.5 on 10/13, labs ordered for Monday  Afebrile  Urine culture blood cultures no growth-final  - Empiric vancomycin and cefepime discontinued.   -continue to monitor serially.   -afebrile, wounds clean 12. Prediabetes. Levemir 5 units stopped  -add oral agent if needed.  -checking cbg's tid with meals for now.   Labile on 10/17, monitor for trend CBG (last 3)  Recent Labs    12/18/18 1655 12/19/18 0627 12/19/18 1139  GLUCAP 123* 123* 191*   13. Combined CHF  EF of 25-30%   Daily weights    Filed Weights   12/17/18 0318 12/18/18 0500 12/19/18 0232  Weight: 82.8 kg 82.3 kg 82.5 kg   Stable on 10/17  -may need to be sl "dry" to maintain ideal weight  -continue diuretics as above 14.  Hyponatremia  Sodium 133 on 10/16  Labs ordered for Monday    LOS: 8 days A FACE TO FACE EVALUATION WAS PERFORMED  Lambert Jeanty 11/16 12/19/2018, 1:51 PM

## 2018-12-20 ENCOUNTER — Inpatient Hospital Stay (HOSPITAL_COMMUNITY): Payer: Medicare Other

## 2018-12-20 DIAGNOSIS — I5043 Acute on chronic combined systolic (congestive) and diastolic (congestive) heart failure: Secondary | ICD-10-CM

## 2018-12-20 LAB — GLUCOSE, CAPILLARY
Glucose-Capillary: 141 mg/dL — ABNORMAL HIGH (ref 70–99)
Glucose-Capillary: 147 mg/dL — ABNORMAL HIGH (ref 70–99)
Glucose-Capillary: 122 mg/dL — ABNORMAL HIGH (ref 70–99)
Glucose-Capillary: 183 mg/dL — ABNORMAL HIGH (ref 70–99)

## 2018-12-20 NOTE — Progress Notes (Signed)
[place] PHYSICAL MEDICINE & REHABILITATION PROGRESS NOTE   Subjective/Complaints: Patient seen laying in bed this morning.  He states he slept well overnight.  He states he is still sleepy and would like to sleep more.  Nursing notes reviewed-patient requiring amount of attention.  ROS: Denies CP, SOB, N/V/D  Objective:   No results found. No results for input(s): WBC, HGB, HCT, PLT in the last 72 hours. Recent Labs    12/18/18 0633  NA 133*  K 4.2  CL 101  CO2 24  GLUCOSE 122*  BUN 19  CREATININE 1.28*  CALCIUM 8.2*    Intake/Output Summary (Last 24 hours) at 12/20/2018 1132 Last data filed at 12/20/2018 1053 Gross per 24 hour  Intake 620 ml  Output 500 ml  Net 120 ml     Physical Exam: Vital Signs Blood pressure 121/64, pulse 89, temperature (!) 97.5 F (36.4 C), temperature source Oral, resp. rate 16, height 5\' 8"  (1.727 m), weight 82.8 kg, SpO2 100 %. Constitutional: No distress . Vital signs reviewed. HENT: Normocephalic.  Atraumatic. Eyes: EOMI. No discharge. Cardiovascular: No JVD. Respiratory: Normal effort.  No stridor. GI: Non-distended. Skin: Sternotomy site C/D/I  Psych: Normal mood.  Normal behavior. Musc: No edema in extremities.  No tenderness in extremities. Neurologic: Alert Motor: Bilateral upper extremities 5/5 proximal distal, stable Bilateral lower extremities: Hip flexion, knee extension 4 --4/5, ankle dorsiflexion 4/5   Assessment/Plan: 1. Functional deficits secondary to Debility following CABG  which require 3+ hours per day of interdisciplinary therapy in a comprehensive inpatient rehab setting.  Physiatrist is providing close team supervision and 24 hour management of active medical problems listed below.  Physiatrist and rehab team continue to assess barriers to discharge/monitor patient progress toward functional and medical goals  Care Tool:  Bathing  Bathing activity did not occur: Refused Body parts bathed by patient:  Right arm, Left arm, Chest, Abdomen, Front perineal area, Buttocks, Right upper leg, Left upper leg, Face   Body parts bathed by helper: Right lower leg, Left lower leg     Bathing assist Assist Level: Minimal Assistance - Patient > 75%     Upper Body Dressing/Undressing Upper body dressing   What is the patient wearing?: Pull over shirt    Upper body assist Assist Level: Supervision/Verbal cueing    Lower Body Dressing/Undressing Lower body dressing      What is the patient wearing?: Pants     Lower body assist Assist for lower body dressing: Minimal Assistance - Patient > 75%     Toileting Toileting    Toileting assist Assist for toileting: Minimal Assistance - Patient > 75%     Transfers Chair/bed transfer  Transfers assist     Chair/bed transfer assist level: Contact Guard/Touching assist     Locomotion Ambulation   Ambulation assist      Assist level: Contact Guard/Touching assist Assistive device: Walker-rolling Max distance: 100'   Walk 10 feet activity   Assist     Assist level: Contact Guard/Touching assist Assistive device: Walker-rolling   Walk 50 feet activity   Assist    Assist level: Contact Guard/Touching assist Assistive device: Walker-rolling    Walk 150 feet activity   Assist Walk 150 feet activity did not occur: Safety/medical concerns         Walk 10 feet on uneven surface  activity   Assist     Assist level: Minimal Assistance - Patient > 75% Assistive device: Hand held assist   Wheelchair  Assist Will patient use wheelchair at discharge?: (TBD but anticipate pt will be functional ambulator)             Wheelchair 50 feet with 2 turns activity    Assist            Wheelchair 150 feet activity     Assist          Blood pressure 121/64, pulse 89, temperature (!) 97.5 F (36.4 C), temperature source Oral, resp. rate 16, height 5\' 8"  (1.727 m), weight 82.8 kg, SpO2 100  %.    Medical Problem List and Plan:  1. Debility secondary to CAD with non-STEMI status post CABG with MVR 11/30/2018 with postoperative mediastinal bleeding/cardiogenic shock/acute respiratory failure with removal of Impella left ventricular assistive device evacuation of left hemothorax 12/04/2018. Sternal precautions   Continue CIR 2. Antithrombotics:  -DVT/anticoagulation: Lovenox. Monitor for any bleeding episodes  -antiplatelet therapy: Aspirin 325 mg daily  3. Pain Management: Tramadol as needed   -xrays demonstrate mild OA, osteophytes   -continue voltaren gel, supportive measures 4. Mood: Provide emotional support  -antipsychotic agents: N/A  5. Neuropsych: This patient is? Fully capable of making decisions on his own behalf.  6. Skin/Wound Care: Routine skin checks   -removed staples from neck/chest  -continue abdominal sutures from LVAD removal 7. Fluids/Electrolytes/Nutrition: Routine in and outs.   --resumed demadex 10/14  -Cr 1.28 on 10/16, labs ordered for tomorrow 8. Acute blood loss anemia. CBC ordered  9. Hypertension. Entresto 24-26 mg twice daily, Aldactone 25 mg daily, Demadex 20 mg daily reduced to 10mg     Vitals:   12/19/18 1941 12/20/18 0311  BP: 121/65 121/64  Pulse: 89 89  Resp: 16 16  Temp: 98 F (36.7 C) (!) 97.5 F (36.4 C)  SpO2: 97% 100%   Controlled on 10/18 10. PAF. Amiodarone 200 mg daily. Cardiac rate controlled. Monitor with increased physical exertion.  11. Leukocytosis. recurrent.   WBCs 13.5 on 10/13, labs ordered for tomorrow  Afebrile  Urine culture blood cultures no growth-final  - Empiric vancomycin and cefepime discontinued.   -continue to monitor serially.   -afebrile, wounds clean 12. Prediabetes. Levemir 5 units stopped  -add oral agent if needed.  -checking cbg's tid with meals for now.   Labile on 10/18, monitor trend CBG (last 3)  Recent Labs    12/19/18 1139 12/19/18 1634 12/20/18 0623  GLUCAP 191* 105* 141*    13. Combined CHF   EF of 25-30%   Daily weights   Filed Weights   12/18/18 0500 12/19/18 0232 12/20/18 0311  Weight: 82.3 kg 82.5 kg 82.8 kg   Stable on 10/18  -may need to be sl "dry" to maintain ideal weight  -continue diuretics as above 14.  Hyponatremia  Sodium 133 on 10/16, labs ordered for tomorrow    LOS: 9 days A FACE TO FACE EVALUATION WAS PERFORMED  Curtis [name] Lorie Phenix 12/20/2018, 11:32 AM

## 2018-12-20 NOTE — Progress Notes (Signed)
Barriers to discharge: pt calling out multiple times over weekend for bed and pillows to be adjusted for him. Informed pt that will have to do for himself at home. Pt reports that not able to do this for himself?? Pt reports that neighbors are checking in on his mother.  Informed pt that will be leaving soon and needs to stay up and sit in chair/recliner. Pt reports that not comfortable. Asked if going to lay around when gets home. Pt reports that his chairs are more comfortable. Do not feel pt will be able to take care of himself and mother once he returns home.

## 2018-12-20 NOTE — Progress Notes (Signed)
Physical Therapy Session Note  Patient Details  Name: Curtis [name] MRN: [id number] Date of Birth: 07/09/1950  Today's Date: 12/20/2018 PT Individual Time: 0900-0930 and 1405-1440  PT Individual Time Calculation (min): 30 and 35 min   Short Term Goals: Week 2:  PT Short Term Goal 1 (Week 2): =LTGs due to ELOS  Skilled Therapeutic Interventions/Progress Updates:  Session 1:  Patient in bed upon PT arrival. Patient alert and agreeable to PT session. Patient reported sleeping poorly last night due to several interruptions from staff, stating "I feel very tired today." Patient denied pain throughout session. Patient recalled "tube" precautions and no pushing/pulling/lifting with UEs at beginning of session  Therapeutic Activity: Bed Mobility: Patient performed supine to/from sit with min A to maintain sternal precautions with arm across his chest. Provided verbal cues for keeping arms across his chest, performing supine<>side lying and pulling LEs up toward his chest to assist with sitting. Transfers: Patient performed sit to/from stand x3 with min A with arms across chest. Provided verbal cues for leaning far forward and performing rocking x3 for momentum to stand without UEs.  Gait Training:  Patient ambulated 10 feet, 110 feet, and 120 feet using RW with CGA-close supervision. Ambulated with decreased gait speed, decreased step length and height, increased hip and knee flexion, forward trunk lean, and downward head gaze. Provided verbal cues for erect posture, looking ahead, and increased step height for increased foot clearance.  Patient in bed at end of session with breaks locked, bed alarm set, and all needs within reach. Educated patient on benefits of sitting OOB and patient stated he needed to rest now due to poor sleep last night, but agreed to sitting up for lunch.   Session 2: Patient in bed upon PT arrival. Patient alert and agreeable to PT session. Patient denied pain throughout  session.   Therapeutic Activity: Bed Mobility: Patient performed supine to/from sit with min A-CGA, recalling cues form previous session. Provided verbal cues for not pushing up with UEs from side lying. Transfers: Patient performed sit to/from stand and a toilet transfer with min A-CGA using the RW. Provided verbal cues as below with blocked practice, see Neuromuscular Re-ed. Patient was continent of bowl and bladder and required total A for peri-care and LB dressing during toileting.   Gait Training:  Patient ambulated 15 feet to/from the bathroom using RW with CGA for safety. Ambulated as above.   Neuromuscular Re-ed: Patient performed blocked practice for sit to/from stands for functional mobility maintaining sternal precautions without using the RW to decreased UE use. Patient placed arms across his chest and performed sit to/form stand x5 with min A progressing to CGA with bed in lowest setting. Provided cues and demonstration for leaning forward until hips come off the bed before pushing up and leaning forward for a controlled descent when sitting. Once standing patient performed static standing balance x20-30 seconds each trial, on final trial patient performed small marching without UE support x30 seconds for balance and endurance training, RPE 9/10 after, HR 101, SPO2 90%. Provided sitting rest breaks in between due to decreased activity tolerance.   Patient reported he was very fatigued and was in bed at end of session with breaks locked, bed alarm set, and all needs within reach. Educated on use of pulse oximeter at home to monitor vitals with activity, as patient asked about heart monitors for home during session.    Therapy Documentation Precautions:  Precautions Precautions: Fall, Sternal Restrictions Weight Bearing Restrictions: Yes(sternal precaution)  Other Position/Activity Restrictions: sternal precautions    Therapy/Group: Individual Therapy  Aireona Torelli L Alwin Lanigan PT,  DPT  12/20/2018, 4:07 PM

## 2018-12-21 ENCOUNTER — Encounter (HOSPITAL_COMMUNITY): Payer: Medicare Other | Admitting: Psychology

## 2018-12-21 ENCOUNTER — Inpatient Hospital Stay (HOSPITAL_COMMUNITY): Payer: Medicare Other

## 2018-12-21 ENCOUNTER — Inpatient Hospital Stay (HOSPITAL_COMMUNITY): Payer: Medicare Other | Admitting: Occupational Therapy

## 2018-12-21 ENCOUNTER — Inpatient Hospital Stay (HOSPITAL_COMMUNITY): Payer: Medicare Other | Admitting: Speech Pathology

## 2018-12-21 DIAGNOSIS — R52 Pain, unspecified: Secondary | ICD-10-CM

## 2018-12-21 LAB — CBC
HCT: 38.1 % — ABNORMAL LOW (ref 39.0–52.0)
Hemoglobin: 12.4 g/dL — ABNORMAL LOW (ref 13.0–17.0)
MCH: 31 pg (ref 26.0–34.0)
MCHC: 32.5 g/dL (ref 30.0–36.0)
MCV: 95.3 fL (ref 80.0–100.0)
Platelets: 354 10*3/uL (ref 150–400)
RBC: 4 MIL/uL — ABNORMAL LOW (ref 4.22–5.81)
RDW: 17.9 % — ABNORMAL HIGH (ref 11.5–15.5)
WBC: 14.4 10*3/uL — ABNORMAL HIGH (ref 4.0–10.5)
nRBC: 0 % (ref 0.0–0.2)

## 2018-12-21 LAB — BASIC METABOLIC PANEL
Anion gap: 13 (ref 5–15)
BUN: 13 mg/dL (ref 8–23)
CO2: 23 mmol/L (ref 22–32)
Calcium: 8.9 mg/dL (ref 8.9–10.3)
Chloride: 99 mmol/L (ref 98–111)
Creatinine, Ser: 1.46 mg/dL — ABNORMAL HIGH (ref 0.61–1.24)
GFR calc Af Amer: 57 mL/min — ABNORMAL LOW (ref >60)
GFR calc non Af Amer: 49 mL/min — ABNORMAL LOW (ref >60)
Glucose, Bld: 147 mg/dL — ABNORMAL HIGH (ref 70–99)
Potassium: 3.9 mmol/L (ref 3.5–5.1)
Sodium: 135 mmol/L (ref 135–145)

## 2018-12-21 LAB — GLUCOSE, CAPILLARY
Glucose-Capillary: 117 mg/dL — ABNORMAL HIGH (ref 70–99)
Glucose-Capillary: 151 mg/dL — ABNORMAL HIGH (ref 70–99)
Glucose-Capillary: 107 mg/dL — ABNORMAL HIGH (ref 70–99)

## 2018-12-21 NOTE — Consult Note (Signed)
Neuropsychological Consultation   Patient:   Curtis [name]   DOB:   1950-07-05  MR Number:  [id number]  Location:  [facility] [id number] [place] [address] Dept: [place]: [phone]           Date of Service:   12/21/2018  Start Time:   10 AM End Time:   11 AM  Provider/Observer:  Ilean Skill, Psy.D.       Clinical Neuropsychologist       Billing Code/Service: [phone]  Chief Complaint:    Curtis [name] is a 68 year old male with a history of tobacco abuse on no prescription medications.  The patient presented on 11/24/2018 with worsening shortness of breath, tachycardia, and tachypnea.  Oxygen saturation 60% with room air.  There are reports of multiple syncopal episodes in route but patient never lost pulse.  He did receive epinephrine as well as Solu-Medrol.  The patient underwent cardiac catheterization per Dr. Daneen Schick showing severe multivessel CAD with 40% tubular left main.  The patient underwent CABG x3 as well as mitral valve replacement on 11/30/2018.  Postoperative cardiogenic shock with mediastinal bleeding and patient return to emergently to the OR and underwent removal of Impella left ventricular assist device evacuation of left hemothorax.  The patient has had intermittent bouts of confusion that is steadily improving postoperatively.  The patient is now admitted to the comprehensive rehabilitation program due to debility.  Reason for Service:  The patient was referred for neuropsychological consult due to coping issues as well as concerns about cognition.  Below is the HPI for the current admission.  HPI: Curtis [name] is a 67 year old right-handed male with history of tobacco abuse on no prescription medications. History taken from chart review and patient due to delayed processing. Per chart review he lives with elderly parent. He is retired. Independent prior to admission to  level home with 3 steps to entry. 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 Presented 11/24/2018 with worsening SOB, tachycardia, and tachypnea. Oxygen saturations 60% on room air. There were reports of multiple syncopal episodes in route but patient never lost pulses. He did receive epinephrine 0.3 mg as well as Solu-Medrol. Patient placed on BiPAP, high-sensitivity troponin 63. ECG showed narrow complex tachycardia. BUN 16, creatinine 1.4, COVID negative. Chest x-ray showed mild cardiomegaly and moderate pulmonary edema. Echocardiogram with EF ~25%. Underwent cardiac catheterization per Dr. Daneen Schick showing severe multivessel CAD with 40% tubular left main. 60 to 70% proximal LAD tandem lesions. Severe 80% diffuse disease in the large first diagonal. Underwent CABG x3 as well as mitral valve replacement 11/30/2018 per Dr. Orvan Seen of [facility]. Postoperative cardiogenic shock with mediastinal bleeding and patient returned emergently to the OR and underwent removal of Impella left ventricular assistive device evacuation of left hemothorax 12/04/2018. Patient was extubated 12/05/2018. CT negative for pulmonary emboli or significant effusion or infiltrate. Hospital course further complicated by pain and ABLA, 10.1 as well as leukocytosis 17,300 improved to 15,900 and patient had been maintained on empiric vancomycin and cefepime. Urine culture and blood cultures no growth. Subcutaneous Lovenox for DVT prophylaxis. Patient is currently maintained also on aspirin 325 mg daily after CABG. Patient had initially been placed on milrinone transition to amiodarone 200 mg twice daily. Findings of mildly elevated hemoglobin A1c 5.8 with sliding scale insulin and low-dose Levemir added. Patient is tolerating a regular consistency diet. There have been intermittent bouts of confusion  that is steadily improve postoperatively with therapies initiated and sternal precautions as directed. Therapy evaluations completed and  patient was admitted for a comprehensive rehab program.  Current Status:  Patient continued to be very drowsy but was more alert today than her first visit.  The patient avoided sitting up and would often closes eyes during our discussion.  His affect was very flat.  The patient admitted to times of feeling extremely fatigued and not sleeping well at night.  He did report that he felt like he was improving during his stay and was looking forward to going home.  We had long discussions about what he will need to continue to work on once he goes home and working on issues and observing for issues of depression or other symptoms that would negatively impact his recovery and therapies.  We will try to follow-up with him tomorrow when he is able to have more of a viable interaction.   Diagnosis:   Debility        Electronically Signed   _______________________ Arley Phenix, Psy.D.

## 2018-12-21 NOTE — Plan of Care (Signed)
  Problem: Consults Goal: RH GENERAL PATIENT EDUCATION Description: See Patient Education module for education specifics. Outcome: Progressing Goal: Skin Care Protocol Initiated - if Braden Score 18 or less Description: If consults are not indicated, leave blank or document N/A Outcome: Progressing Goal: Nutrition Consult-if indicated Outcome: Progressing Goal: Diabetes Guidelines if Diabetic/Glucose > 140 Description: If diabetic or lab glucose is > 140 mg/dl - Initiate Diabetes/Hyperglycemia Guidelines & Document Interventions  Outcome: Progressing   Problem: RH BLADDER ELIMINATION Goal: RH STG MANAGE BLADDER WITH ASSISTANCE Description: STG Manage Bladder With min Assistance Outcome: Progressing   Problem: RH SKIN INTEGRITY Goal: RH STG SKIN FREE OF INFECTION/BREAKDOWN Description: No skin breakdown or wound infection while in rehab with min assist Outcome: Progressing Goal: RH STG MAINTAIN SKIN INTEGRITY WITH ASSISTANCE Description: STG Maintain Skin Integrity With min Assistance. Outcome: Progressing Goal: RH STG ABLE TO PERFORM INCISION/WOUND CARE W/ASSISTANCE Description: STG Able To Perform Incision/Wound Care With min  assistance. Outcome: Progressing   Problem: RH SAFETY Goal: RH STG ADHERE TO SAFETY PRECAUTIONS W/ASSISTANCE/DEVICE Description: STG Adhere to Safety Precautions With min Assistance/Device. Outcome: Progressing Goal: RH STG DECREASED RISK OF FALL WITH ASSISTANCE Description: STG Decreased Risk of Fall With min assistance. Outcome: Progressing   Problem: RH PAIN MANAGEMENT Goal: RH STG PAIN MANAGED AT OR BELOW PT'S PAIN GOAL Description: Pain <3 Outcome: Progressing   Problem: RH KNOWLEDGE DEFICIT GENERAL Goal: RH STG INCREASE KNOWLEDGE OF SELF CARE AFTER HOSPITALIZATION Description: Information on self care after hospitalization will increase/improve with min assist Outcome: Progressing   

## 2018-12-21 NOTE — Progress Notes (Signed)
Occupational Therapy Session Note  Patient Details  Name: Curtis [name] MRN: [id number] Date of Birth: December 27, 1950  Today's Date: 12/21/2018 OT Individual Time: 1300-1356 OT Individual Time Calculation (min): 56 min    Short Term Goals: Week 2:  OT Short Term Goal 1 (Week 2): STG=LTG 2/2 ELOS  Skilled Therapeutic Interventions/Progress Updates:    Pt greeted semi-reclined in bed with lunch tray. Pt ate icie, fruit cup, vanilla ice cream, and and ginger ale. Discussed importance of adding protein to diet as pt only ate sugarry foods for lunch. Pt did not seem very receptive to education. Practiced bed mobility from flat bed without rails with verbal cues and supervision. Pt ambulated to the sink with RW and 2 trials to stand as pt tried to push up and was not following sternal precautions initially. Pt tolerated standing for toothbrushing task with set-up A, then maintained standing to wash buttocks and peri-area with supervision . OT assist to don new brief. UB bathing completed sitting in wc with set-up A. Discussed home set-up with pt having step-over tub. Pt currently is not cleared to shower. Pt brought down to tub room and discussed use of tub transfer bench when he is cleared to shower for increased independence and safety. Pt declined therapist to order tub bench 2/2 cost and says he will sponge bathe until he can safely step over tub. LB there-ex and standing balance/endurance with standing toe taps on medium sized cones. Supervision with intermittent CGA for balance with standing activity. Pt completed 3 sets of 10 with rest breaks in between. Pt agreeable to sit up in wc and was left seated in wc with pt able to demonstrate use of touch call bell, chair alarm on, and needs met.   Therapy Documentation Precautions:  Precautions Precautions: Fall, Sternal Restrictions Weight Bearing Restrictions: Yes(sternal precaution) Other Position/Activity Restrictions: sternal precautions Pain:  denies pain  Therapy/Group: Individual Therapy  Valma Cava 12/21/2018, 1:25 PM

## 2018-12-21 NOTE — Progress Notes (Signed)
Physical Therapy Session Note  Patient Details  Name: Curtis [name] MRN: [id number] Date of Birth: [birth year]  Today's Date: 12/21/2018 PT Individual Time: 1100-1200 PT Individual Time Calculation (min): 60 min   Short Term Goals: Week 2:  PT Short Term Goal 1 (Week 2): =LTGs due to ELOS  Skilled Therapeutic Interventions/Progress Updates:     Session 1: Patient in bed upon PT arrival. Patient alert and agreeable to PT session. Patient denied pain throughout session.   Therapeutic Activity: Bed Mobility: Patient performed supine to/from sit with supervision with use of UE's despite PT cues for patient to avoid using UE's to maintain sternal precautions. Provided verbal cues for performing log rolling and using abdominals to sit up from side lying by pulling B knees up for abdominal activation. Transfers: Patient performed sit to/from stand x8 with CGA. Provided verbal cues for no use of UEs to maintain spinal precautions, patient followed cues 6/8 trials, also provided cues for leaning far forward to stand.  Gait Training:  Patient ambulated ~75 feet with decreased gait speed, decreased step length, forward trunk lean, and decreased step height, using RW with CGA for safety then continued on to go up 4-6" steps using B rails with reciprocal gait pattern, despite cues to hold 1 rail standing close to maintain sternal precautions and step-to gait pattern for improved balance. PT provided a demonstration of this technique while patient took a seated rest break and patient went up/down 4-6" steps following demonstration with 1 hand rail and intermittent reciprocal and step-to gait pattern and intermittent use of second rail due to decreased balance with reciprocal gait pattern. Provided verbal cues for use of step-to gait pattern for improved balance and use of only 1 rail throughout.  Wheelchair Mobility:  Patient propelled wheelchair 75 feet with supervision using B LEs with a pillow behind him  for proper body mechanics for LE strengthening/endurance. Required 1 sitting rest break due to fatigue. Provided verbal cues for technique.  Therapeutic Exercise: Patient performed the following exercises with verbal and tactile cues for proper technique. -Patient performed B step-taps at the 6" steps 2x10 holding 1 rail for balance and endurance training.   Patient in bed at end of session with breaks locked, bed alarm set, and all needs within reach. PT encouraged patient to sit up for lunch at end of session, however, patient stated he cannot tolerate any of the chairs in the room and requested to sit up in the bed for lunch. PT offered to provide pillows to improve comfort in sitting and discussed benefits of sitting OOB, however, patient still declined.  Session 2: Patient in bed upon PT arrival. Patient alert and agreeable to PT session. Session focused on community and outdoor mobility and gait training outside in the [facility].   Therapeutic Activity: Bed Mobility: Patient performed supine to/from sit with supervision with decreased use of UE this session. Provided verbal cues as above. Transfers: Patient performed sit to/from stand x2 using the RWand stand pivot x2 without an AD with CGA. Provided verbal cues for rocking x3 to stand and leaning forward to stand with minimal to no use of UEs.  Gait Training:  Patient ambulated 110 feet x2using RW over unlevel surfaces on the sidewalk outside with CGA. Ambulated as above with intermittent catching of RW on uneven surfaces due to patient's decreased vision. Provided verbal cues for upcoming changes in the sidewalk, decreased gait speed for improved balance and safety awareness, and increased step height to reduce fall risk with  unlevel surfaces.  Wheelchair Mobility:  Patient was transported in the w/c with total A throughout session for energy conservation and time management.  Patient in bed at end of session with breaks locked, bed alarm  set, and all needs within reach. Dicussed home set up and responsibilities to care for his mother throughout session. Stated that he gets her a glass of water, approximately every 2 hours, and makes lunch and dinner, his mother gets her own breakfast, and that he spends most of his day in his room lying in the bed reading or watching TV. PT discussed increasing daily activity upon d/c and encouraged sitting OOB at home. Discussed sitting outside on the front porch and patient was very receptive to adding this to his daily routine.    Therapy Documentation Precautions:  Precautions Precautions: Fall, Sternal Restrictions Weight Bearing Restrictions: Yes(sternal precaution) Other Position/Activity Restrictions: sternal precautions    Therapy/Group: Individual Therapy  Saida Lonon L Taleah Bellantoni PT, DPT  12/21/2018, 4:05 PM

## 2018-12-21 NOTE — Progress Notes (Signed)
Speech Language Pathology Discharge Summary  Patient Details  Name: Curtis [name] MRN: [id number] Date of Birth: 07-Jan-1951  Today's Date: 12/21/2018 SLP Individual Time: 0800-0840 SLP Individual Time Calculation (min): 40 min   Skilled Therapeutic Interventions: Skilled treatment session focused on cognitive goals. SLP facilitated session by administering the Cognistat. Patient scored WFL on all subtests. Suspect patient is at his baseline level of cognitive functioning and patient verbalized agreement. SLP also facilitated session by completing education in regards to memory compensatory strategies and how to incorporate the strategies at home. Patient verbalized understanding and the handout was left in his notebook. Patient left supine in bed with alarm on and all needs within reach.   Patient has met 5 of 5 long term goals.  Patient to discharge at overall Supervision level.   Reasons goals not met: N/A   Clinical Impression/Discharge Summary: Patient has made functional gains and has met 5 of 5 LTGs this admission. Currently, patient requires overall supervision level verbal cues to complete functional and familiar tasks safely in regards to problem solving, attention, awareness and recall. Patient is at his baseline level of cognitive functioning and all education is complete. Therefore, patient will be discharged from skilled SLP intervention and f/u is not warranted at this time.   Care Partner:  Caregiver Able to Provide Assistance: Yes  Type of Caregiver Assistance: Physical;Cognitive  Recommendation:  24 hour supervision/assistance      Equipment: N/A   Reasons for discharge: Treatment goals met   Patient/Family Agrees with Progress Made and Goals Achieved: Yes    Cristiana Yochim 12/21/2018, 2:48 PM

## 2018-12-21 NOTE — Progress Notes (Signed)
[facility] PHYSICAL MEDICINE & REHABILITATION PROGRESS NOTE   Subjective/Complaints: State that he's "doing ok". Asked him if his appetite is reasonable and he's says the same  ROS: Patient denies fever, rash, sore throat, blurred vision, nausea, vomiting, diarrhea, cough,   joint or back pain, headache, or mood change.    Objective:   No results found. Recent Labs    12/21/18 0757  WBC 14.4*  HGB 12.4*  HCT 38.1*  PLT 354   Recent Labs    12/21/18 0757  NA 135  K 3.9  CL 99  CO2 23  GLUCOSE 147*  BUN 13  CREATININE 1.46*  CALCIUM 8.9    Intake/Output Summary (Last 24 hours) at 12/21/2018 1124 Last data filed at 12/21/2018 0900 Gross per 24 hour  Intake 940 ml  Output 150 ml  Net 790 ml     Physical Exam: Vital Signs Blood pressure (!) 120/58, pulse 82, temperature (!) 97.5 F (36.4 C), temperature source Oral, resp. rate 16, height 5\' 8"  (1.727 m), weight 81.1 kg, SpO2 99 %. Constitutional: No distress . Vital signs reviewed. HEENT: EOMI, oral membranes moist Neck: supple Cardiovascular: RRR without murmur. No JVD    Respiratory: CTA Bilaterally without wheezes or rales. Normal effort    GI: BS +, non-tender, non-distended  Skin: Sternotomy site C/D/I , abdominal sutures in place Psych: Normal mood.  Normal behavior. Musc: No edema in extremities.  No tenderness in extremities. Neurologic: Alert Motor: Bilateral upper extremities 5/5 proximal distal, stable Bilateral lower extremities: Hip flexion, knee extension 4 --4/5, ankle dorsiflexion 4/5   Assessment/Plan: 1. Functional deficits secondary to Debility following CABG  which require 3+ hours per day of interdisciplinary therapy in a comprehensive inpatient rehab setting.  Physiatrist is providing close team supervision and 24 hour management of active medical problems listed below.  Physiatrist and rehab team continue to assess barriers to discharge/monitor patient progress toward functional and  medical goals  Care Tool:  Bathing  Bathing activity did not occur: Refused Body parts bathed by patient: Right arm, Left arm, Chest, Abdomen, Front perineal area, Buttocks, Right upper leg, Left upper leg, Face   Body parts bathed by helper: Right lower leg, Left lower leg     Bathing assist Assist Level: Minimal Assistance - Patient > 75%     Upper Body Dressing/Undressing Upper body dressing   What is the patient wearing?: Pull over shirt    Upper body assist Assist Level: Supervision/Verbal cueing    Lower Body Dressing/Undressing Lower body dressing      What is the patient wearing?: Pants     Lower body assist Assist for lower body dressing: Minimal Assistance - Patient > 75%     Toileting Toileting    Toileting assist Assist for toileting: Minimal Assistance - Patient > 75%     Transfers Chair/bed transfer  Transfers assist     Chair/bed transfer assist level: Minimal Assistance - Patient > 75% Chair/bed transfer assistive device: Museum/gallery exhibitions officer assist      Assist level: Contact Guard/Touching assist Assistive device: Walker-rolling Max distance: 120'   Walk 10 feet activity   Assist     Assist level: Contact Guard/Touching assist Assistive device: Walker-rolling   Walk 50 feet activity   Assist    Assist level: Contact Guard/Touching assist Assistive device: Walker-rolling    Walk 150 feet activity   Assist Walk 150 feet activity did not occur: Safety/medical concerns  Walk 10 feet on uneven surface  activity   Assist     Assist level: Minimal Assistance - Patient > 75% Assistive device: Hand held assist   Wheelchair     Assist Will patient use wheelchair at discharge?: (TBD but anticipate pt will be functional ambulator)             Wheelchair 50 feet with 2 turns activity    Assist            Wheelchair 150 feet activity     Assist           Blood pressure (!) 120/58, pulse 82, temperature (!) 97.5 F (36.4 C), temperature source Oral, resp. rate 16, height 5\' 8"  (1.727 m), weight 81.1 kg, SpO2 99 %.    Medical Problem List and Plan:  1. Debility secondary to CAD with non-STEMI status post CABG with MVR 11/30/2018 with postoperative mediastinal bleeding/cardiogenic shock/acute respiratory failure with removal of Impella left ventricular assistive device evacuation of left hemothorax 12/04/2018. Sternal precautions   Continue CIR  -making some gains with activity tolerance 2. Antithrombotics:  -DVT/anticoagulation: Lovenox. Monitor for any bleeding episodes  -antiplatelet therapy: Aspirin 325 mg daily  3. Pain Management: Tramadol as needed   -xrays demonstrate mild OA, osteophytes   -continue voltaren gel, supportive measures 4. Mood: Provide emotional support  -antipsychotic agents: N/A  5. Neuropsych: This patient is? Fully capable of making decisions on his own behalf.  6. Skin/Wound Care: Routine skin checks   -removed staples from neck/chest  -continue abdominal sutures from LVAD removal 7. Fluids/Electrolytes/Nutrition: Routine in and outs.  Discussed importance of adequate PO intake,especially protein. Intake has been sporadic (0-100%)  -Cr 1.46 today  8. Acute blood loss anemia. CBC ordered  9. Hypertension. Entresto 24-26 mg twice daily, Aldactone 25 mg daily, Demadex 20 mg daily reduced to 10mg     Vitals:   12/20/18 1925 12/21/18 0402  BP: (!) 109/59 (!) 120/58  Pulse: 89 82  Resp: 16 16  Temp: (!) 97.4 F (36.3 C) (!) 97.5 F (36.4 C)  SpO2: 99% 99%   Controlled on 10/19 10. PAF. Amiodarone 200 mg daily. Cardiac rate controlled. Monitor with increased physical exertion.  11. Leukocytosis. recurrent.   WBCs 14.4 10/19  Afebrile  Urine culture blood cultures no growth-final  - Empiric vancomycin and cefepime discontinued.   -continue to monitor serially.   -afebrile, wounds clean 12. Prediabetes.  Levemir 5 units stopped  -add oral agent if needed.  -checking cbg's tid with meals for now.   -some elevation yesterday---no rx yet CBG (last 3)  Recent Labs    12/20/18 1641 12/20/18 2125 12/21/18 0614  GLUCAP 122* 183* 117*   13. Combined CHF   EF of 25-30%   Daily weights   Filed Weights   12/19/18 0232 12/20/18 0311 12/21/18 0402  Weight: 82.5 kg 82.8 kg 81.1 kg   Stable on 10/18  -may need to be sl "dry" to maintain ideal weight  -continue demadex 10mg  14.  Hyponatremia  Sodium 135 10/19    LOS: 10 days A FACE TO FACE EVALUATION WAS PERFORMED  11/18 12/21/2018, 11:24 AM

## 2018-12-22 ENCOUNTER — Inpatient Hospital Stay (HOSPITAL_COMMUNITY): Payer: Medicare Other | Admitting: Physical Therapy

## 2018-12-22 ENCOUNTER — Inpatient Hospital Stay (HOSPITAL_COMMUNITY): Payer: Medicare Other | Admitting: Occupational Therapy

## 2018-12-22 LAB — GLUCOSE, CAPILLARY
Glucose-Capillary: 105 mg/dL — ABNORMAL HIGH (ref 70–99)
Glucose-Capillary: 231 mg/dL — ABNORMAL HIGH (ref 70–99)
Glucose-Capillary: 114 mg/dL — ABNORMAL HIGH (ref 70–99)

## 2018-12-22 NOTE — Progress Notes (Signed)
Physical Therapy Session Note  Patient Details  Name: Curtis [name] MRN: [id number] Date of Birth: 11-May-1950  Today's Date: 12/22/2018 PT Individual Time: 1620-1725 PT Individual Time Calculation (min): 65 min   and  Today's Date: 12/22/2018 PT Missed Time: 10 Minutes Missed Time Reason: Other (Comment);Patient fatigue(symptomatic orthostatic vitals)  Short Term Goals: Week 2:  PT Short Term Goal 1 (Week 2): =LTGs due to ELOS  Skilled Therapeutic Interventions/Progress Updates:   Pt received supine in bed and agreeable to therapy session, requesting to go outside. Supine>sit via logroll technique using bedrails with min cuing to maintain sternal precautions - supervision for safety. Stand pivot EOB>w/c, no AD, with CGA for steadying.  Transported to/from outside in w/c. Therapist educated pt on need for RW at home; however, pt reports his concern is that he will not be able to get it up/down his stairs at home meaning he will only be able to use it on 1 level - therapist recommended seeing if pt could purchase 2nd walker or see if family/friends have an extra. Sit<>stands using RW with CGA for steadying. Ambulated ~219ft outside on brick paved walkway and on concrete paved walkway as well as ~2ft up/down a ramp using RW with CGA for steadying and verbal cuing for directions due to visual impairments - pt demonstrated poor AD control and increased imbalance when walking down the ramp requiring increased cuing. Initiated ambulation up/down the ramp a second time using RW; however, after ~32ft with CGA pt reported onset of lightheadedness - ambulated ~61ft back down ramp using RW with min assist for balance and max cuing for slowing down descent - turned to sit in w/c with min assist for balance. Therapist assessed HR and SpO2 using pulse oximeter: HR 58bpm and SpO2 71%; however, unsure if this was an accurate reading (of note pt wearing a mask during this activity for his safety) - therapist  transported pt back to rehab unit and assessed HR and SpO2 using dynamap: HR 95bpm and SpO2 100%. Pt reports lightheadedness has dissipated and agreeable to continue therapy session. Stand pivot transfer w/c>EOM, no AD, with CGA for steadying. Pt stood up and after ~1 minute reported onset of lightheadedness again.  Therapist performed orthostatic vital assessment: Sitting: BP 105/73 (MAP 82), HR 95bpm, SpO2 100% Standing: BP 67/59 (MAP 62), HR 99bpm, SpO2 100% with pt reporting significant lightheadedness Supine: BP 105/64 (MAP 78), HR 95bpm, SpO2 100% Provided supine rest break for vital sign recovery and symptom dissipation. Supine>sit with min assist for trunk upright and squat pivot transfer EOM>w/c with min assist for pivoting hips and balance. Transported pt back to room in w/c. Squat pivot w/c>EOB with min assist. Sit>supine with supervision. Therapist notified RN and Pam, PA, of pt's vital signs and lightheadedness symptoms during therapy session. Reassessed vitals supine in bed at end of session: BP 136/99 (MAP 113), HR 92bpm, SpO2 100%. Therapist educated pt on importance of drinking enough water during the day, reports only having 2 cups of water today (NT confirms that pt has little fluid intake). Pt left pt supine in bed with needs in reach, bed alarm on, and a cup of water on bedside table.    Therapy Documentation Precautions:  Precautions Precautions: Fall, Sternal Restrictions Weight Bearing Restrictions: Yes(sternal precaution) Other Position/Activity Restrictions: sternal precautions  Pain: Denies pain during session.  Therapy/Group: Individual Therapy  Tawana Scale, PT, DPT 12/22/2018, 4:53 PM

## 2018-12-22 NOTE — Progress Notes (Signed)
[place] PHYSICAL MEDICINE & REHABILITATION PROGRESS NOTE   Subjective/Complaints: No new issues. Hates hospital food. Denies new pain. Can't wait to get home  ROS: Patient denies fever, rash, sore throat, blurred vision, nausea, vomiting, diarrhea, cough,  chest pain, joint or back pain, headache, or mood change.     Objective:   No results found. Recent Labs    12/21/18 0757  WBC 14.4*  HGB 12.4*  HCT 38.1*  PLT 354   Recent Labs    12/21/18 0757  NA 135  K 3.9  CL 99  CO2 23  GLUCOSE 147*  BUN 13  CREATININE 1.46*  CALCIUM 8.9    Intake/Output Summary (Last 24 hours) at 12/22/2018 1027 Last data filed at 12/22/2018 0828 Gross per 24 hour  Intake 960 ml  Output 800 ml  Net 160 ml     Physical Exam: Vital Signs Blood pressure 139/69, pulse 85, temperature 98.5 F (36.9 C), temperature source Oral, resp. rate 16, height 5\' 8"  (1.727 m), weight 82 kg, SpO2 98 %. Constitutional: No distress . Vital signs reviewed. HEENT: EOMI, oral membranes moist Neck: supple Cardiovascular: RRR without murmur. No JVD    Respiratory: CTA Bilaterally without wheezes or rales. Normal effort    GI: BS +, non-tender, non-distended  Skin: Sternotomy site C/D/I , abdominal sutures remain in place Psych: Normal mood.  Normal behavior. Musc: No pain, full ROM Neurologic: Alert Motor: Bilateral upper extremities 5/5 proximal distal, stable Bilateral lower extremities: Hip flexion, knee extension 4 --4/5, ankle dorsiflexion 4/5   Assessment/Plan: 1. Functional deficits secondary to Debility following CABG  which require 3+ hours per day of interdisciplinary therapy in a comprehensive inpatient rehab setting.  Physiatrist is providing close team supervision and 24 hour management of active medical problems listed below.  Physiatrist and rehab team continue to assess barriers to discharge/monitor patient progress toward functional and medical goals  Care Tool:  Bathing   Bathing activity did not occur: Refused Body parts bathed by patient: Right arm, Left arm, Chest, Abdomen, Front perineal area, Buttocks, Right upper leg, Left upper leg, Face, Right lower leg, Left lower leg   Body parts bathed by helper: Right lower leg, Left lower leg     Bathing assist Assist Level: Contact Guard/Touching assist     Upper Body Dressing/Undressing Upper body dressing   What is the patient wearing?: Pull over shirt    Upper body assist Assist Level: Set up assist    Lower Body Dressing/Undressing Lower body dressing      What is the patient wearing?: Pants     Lower body assist Assist for lower body dressing: Contact Guard/Touching assist     Toileting Toileting    Toileting assist Assist for toileting: Minimal Assistance - Patient > 75%     Transfers Chair/bed transfer  Transfers assist     Chair/bed transfer assist level: Minimal Assistance - Patient > 75% Chair/bed transfer assistive device: Programmer, multimedia   Ambulation assist      Assist level: Contact Guard/Touching assist Assistive device: Walker-rolling Max distance: 110'   Walk 10 feet activity   Assist     Assist level: Contact Guard/Touching assist Assistive device: Walker-rolling   Walk 50 feet activity   Assist    Assist level: Contact Guard/Touching assist Assistive device: Walker-rolling    Walk 150 feet activity   Assist Walk 150 feet activity did not occur: Safety/medical concerns         Walk 10  feet on uneven surface  activity   Assist     Assist level: Contact Guard/Touching assist Assistive device: Photographer Will patient use wheelchair at discharge?: (TBD but anticipate pt will be functional ambulator)      Wheelchair assist level: Supervision/Verbal cueing Max wheelchair distance: 75 with B LEs    Wheelchair 50 feet with 2 turns activity    Assist        Assist Level:  Supervision/Verbal cueing   Wheelchair 150 feet activity     Assist          Blood pressure 139/69, pulse 85, temperature 98.5 F (36.9 C), temperature source Oral, resp. rate 16, height 5\' 8"  (1.727 m), weight 82 kg, SpO2 98 %.    Medical Problem List and Plan:  1. Debility secondary to CAD with non-STEMI status post CABG with MVR 11/30/2018 with postoperative mediastinal bleeding/cardiogenic shock/acute respiratory failure with removal of Impella left ventricular assistive device evacuation of left hemothorax 12/04/2018. Sternal precautions   Continue CIR  -team conference today 2. Antithrombotics:  -DVT/anticoagulation: Lovenox. Monitor for any bleeding episodes  -antiplatelet therapy: Aspirin 325 mg daily  3. Pain Management: Tramadol as needed   -xrays demonstrate mild OA, osteophytes   -continue voltaren gel, supportive measures 4. Mood: Provide emotional support  -antipsychotic agents: N/A  5. Neuropsych: This patient is? Fully capable of making decisions on his own behalf.  6. Skin/Wound Care: Routine skin checks   -removed staples from neck/chest  -continue abdominal sutures from LVAD removal---remove prior to dc 7. Fluids/Electrolytes/Nutrition: Routine in and outs.  Discussed importance of adequate PO intake,especially protein. Intake has been sporadic (0-100%)  -Cr 1.46    -f/u labs Thursday 8. Acute blood loss anemia. CBC ordered  9. Hypertension. Entresto 24-26 mg twice daily, Aldactone 25 mg daily, Demadex 20 mg daily reduced to 10mg     Vitals:   12/21/18 1944 12/22/18 0527  BP: (!) 119/52 139/69  Pulse: 87 85  Resp: 16 16  Temp: 98.6 F (37 C) 98.5 F (36.9 C)  SpO2: 98% 98%   Controlled on 10/19 10. PAF. Amiodarone 200 mg daily. Cardiac rate controlled. Monitor with increased physical exertion.  11. Leukocytosis. recurrent.   WBCs 14.4 10/19  Afebrile  Urine culture blood cultures no growth-final  - Empiric vancomycin and cefepime discontinued.    -continue to monitor serially.   -afebrile, wounds clean 12. Prediabetes. Levemir 5 units stopped  -add oral agent if needed.  -checking cbg's tid with meals  -continue just diet control CBG (last 3)  Recent Labs    12/21/18 1155 12/21/18 1713 12/22/18 0606  GLUCAP 151* 107* 105*   13. Combined CHF   EF of 25-30%   Daily weights   Filed Weights   12/20/18 0311 12/21/18 0402 12/22/18 0527  Weight: 82.8 kg 81.1 kg 82 kg   Stable on 10/18  -may need to be sl "dry" to maintain ideal weight  -continue demadex 10mg  14.  Hyponatremia  Sodium 135 10/19    LOS: 11 days A FACE TO FACE EVALUATION WAS PERFORMED  11/18 12/22/2018, 10:27 AM

## 2018-12-22 NOTE — Progress Notes (Signed)
Occupational Therapy Session Note  Patient Details  Name: Curtis [name] MRN: [id number] Date of Birth: 02-21-1951  Today's Date: 12/22/2018 OT Individual Time: 1420-1505 OT Individual Time Calculation (min): 45 min   Short Term Goals: Week 2:  OT Short Term Goal 1 (Week 2): STG=LTG 2/2 ELOS  Skilled Therapeutic Interventions/Progress Updates:    Pt greeted semi-reclined in bed and agreeable to OT treatment session. Pt declined any bathing/dressing today and stated he did not need to go to the bathroom. Pt used LH shoe horn to don shoes with set-up A. He then ambulated to therapy gym with RW and CGA with verbal cues for pathfinding 2/2 low vision. Pt stood on foam edge for balance challenge, then held unweighted ball and pressed it out in front of him for dynamic activity. 4 sets of 15. Verbal cues for hand placement with sit<>stand and made sure strenal precautions were maintained throughout. Pt completed obstacle courase weaving in and out of cones with RW and CGA for turns. Pt returned to room and agreeable to sit in recliner until next therapy session. Chair alarm on and needs met.   Therapy Documentation Precautions:  Precautions Precautions: Fall, Sternal Restrictions Weight Bearing Restrictions: Yes(sternal precaution) Other Position/Activity Restrictions: sternal precautions Pain:   denies pain   Therapy/Group: Individual Therapy  Valma Cava 12/22/2018, 2:51 PM

## 2018-12-22 NOTE — Patient Care Conference (Signed)
Inpatient RehabilitationTeam Conference and Plan of Care Update Date: 12/22/2018   Time: 10:15 AM    Patient Name: Curtis [name]      Medical Record Number: [id number]  Date of Birth: 08-Jun-1950 Sex: Male         Room/Bed: 4W20C/4W20C-01 Payor Info: Payor: MEDICARE / Plan: MEDICARE PART A AND B / Product Type: *No Product type* /    Admit Date/Time:  12/11/2018  6:00 PM  Primary Diagnosis:  Debility  Patient Active Problem List   Diagnosis Date Noted  . Pain   . Acute on chronic combined systolic and diastolic congestive heart failure ([place])   . Hyponatremia   . Labile blood glucose   . Elevated serum creatinine   . Debility 12/11/2018  . S/P MVR (mitral valve replacement)   . Prediabetes   . PAF (paroxysmal atrial fibrillation) ([place])   . Acute blood loss anemia   . Chest tube in place   . S/P CABG x 3 11/30/2018  . Coronary artery disease involving native heart without angina pectoris   . Acute on chronic combined systolic and diastolic CHF (congestive heart failure) ([place])   . Acute respiratory failure with hypoxia and hypercapnia ([place]) 11/24/2018  . Pulmonary edema 11/24/2018  . Hypertensive emergency 11/24/2018  . Elevated troponin 11/24/2018  . Leukocytosis 11/24/2018  . NSTEMI (non-ST elevated myocardial infarction) ([place])   . Flash pulmonary edema ([place])   . Hypertension     Expected Discharge Date: Expected Discharge Date: 12/25/18  Team Members Present: Physician leading conference: Dr. Alger Simons Social Worker Present: Lennart Pall, LCSW Nurse Present: Mohammed Kindle, RN PT Present: Burnard Bunting, PT OT Present: Cherylynn Ridges, OT SLP Present: Weston Anna, SLP PPS Coordinator present : Gunnar Fusi, SLP     Current Status/Progress Goal Weekly Team Focus  Bowel/Bladder   continent x2 with few periods of incontinence  timed toileting while awake.  assess toileting needs qshify and PRN   Swallow/Nutrition/ Hydration             ADL's   Min A overall   Supervision  sternal precautions, low vision, self-care retraining, activity tolerance, standing balance/endurance   Mobility   CGA overall, gait >150' w/ or w/o AD, 8 steps w/ B rails  supervision overall  endurance, LE strengthening, safety awareness and compensations for visual impairments   Communication             Safety/Cognition/ Behavioral Observations  Supervision  Supervision  Goals Met, patient discharged from SLP   Pain   Denies pain  paion less than 3  assess pain qshift and PRN   Skin                *See Care Plan and progress notes for long and short-term goals.     Barriers to Discharge  Current Status/Progress Possible Resolutions Date Resolved   Nursing                  PT                    OT                  SLP                SW                Discharge Planning/Teaching Needs:  Pt to return home with elderly mother.  Local friends/ neighbor to assist with arranging  additional support for them both for the first few weeks home.  Teaching needs TBD.   Team Discussion:  Watching volumes, checking labs, BP and BS controlled, pain improved.  Minimal oral intake, continent of B/B.  Min a overall, S goals for OT.  PT with CGA ambulated 150', working on endurance. 12 steps are still a concern but should be okay by discharge.    Revisions to Treatment Plan:  SLP discontinued    Medical Summary Current Status: bp/cv better. pain controlled. still with suboptimal intake Weekly Focus/Goal: stabilize medically for discharge  Barriers to Discharge: Medication compliance       Continued Need for Acute Rehabilitation Level of Care: The patient requires daily medical management by a physician with specialized training in physical medicine and rehabilitation for the following reasons: Direction of a multidisciplinary physical rehabilitation program to maximize functional independence : Yes Medical management of patient stability for increased activity during  participation in an intensive rehabilitation regime.: Yes Analysis of laboratory values and/or radiology reports with any subsequent need for medication adjustment and/or medical intervention. : Yes   I attest that I was present, lead the team conference, and concur with the assessment and plan of the team.   Jodell Cipro M 12/22/2018, 4:52 PM  Team conference was held via web/ teleconference due to [place] - 19

## 2018-12-22 NOTE — Progress Notes (Addendum)
Physical Therapy Session Note  Patient Details  Name: Will Heinkel MRN: [id number] Date of Birth: 1950/09/21  Today's Date: 12/22/2018 PT Individual Time: 0907-1003 PT Individual Time Calculation (min): 56 min   Short Term Goals: Week 2:  PT Short Term Goal 1 (Week 2): =LTGs due to ELOS  Skilled Therapeutic Interventions/Progress Updates:  Pt received asleep in bed but awakened & agreeable to tx with encouragement. Pt completes bed mobility with supervision, bed rails, and cuing for sternal precautions. Assisted pt with donning shoes for time management. Pt ambulates room<>gym without AD & CGA with rest break once in gym 2/2 fatigue. Pt demonstrates decreased heel strike RLE during gait. Pt performed 10x sit<>stand without BUE support and min assist with multiple rest breaks throughout 2/2 fatigue & encouragement to continue, with task focusing on BLE strengthening. At end of session pt left in bed with alarm set & all needs in reach.   Addendum: Pt uses bed rail to assist with turning to roll in bed despite cuing not to pull 2/2 sternal precautions.   Therapy Documentation Precautions:  Precautions Precautions: Fall, Sternal Restrictions Weight Bearing Restrictions: Yes(sternal precaution) Other Position/Activity Restrictions: sternal precautions  Pain: Pt denied c/o pain.    Therapy/Group: Individual Therapy  Waunita Schooner 12/22/2018, 3:56 PM

## 2018-12-22 NOTE — Plan of Care (Signed)
  Problem: Consults Goal: RH GENERAL PATIENT EDUCATION Description: See Patient Education module for education specifics. Outcome: Progressing Goal: Skin Care Protocol Initiated - if Braden Score 18 or less Description: If consults are not indicated, leave blank or document N/A Outcome: Progressing Goal: Nutrition Consult-if indicated Outcome: Progressing Goal: Diabetes Guidelines if Diabetic/Glucose > 140 Description: If diabetic or lab glucose is > 140 mg/dl - Initiate Diabetes/Hyperglycemia Guidelines & Document Interventions  Outcome: Progressing   Problem: RH BLADDER ELIMINATION Goal: RH STG MANAGE BLADDER WITH ASSISTANCE Description: STG Manage Bladder With min Assistance Outcome: Progressing   Problem: RH SKIN INTEGRITY Goal: RH STG SKIN FREE OF INFECTION/BREAKDOWN Description: No skin breakdown or wound infection while in rehab with min assist Outcome: Progressing Goal: RH STG MAINTAIN SKIN INTEGRITY WITH ASSISTANCE Description: STG Maintain Skin Integrity With min Assistance. Outcome: Progressing Goal: RH STG ABLE TO PERFORM INCISION/WOUND CARE W/ASSISTANCE Description: STG Able To Perform Incision/Wound Care With min  assistance. Outcome: Progressing   Problem: RH SAFETY Goal: RH STG ADHERE TO SAFETY PRECAUTIONS W/ASSISTANCE/DEVICE Description: STG Adhere to Safety Precautions With min Assistance/Device. Outcome: Progressing Goal: RH STG DECREASED RISK OF FALL WITH ASSISTANCE Description: STG Decreased Risk of Fall With min assistance. Outcome: Progressing   Problem: RH PAIN MANAGEMENT Goal: RH STG PAIN MANAGED AT OR BELOW PT'S PAIN GOAL Description: Pain <3 Outcome: Progressing   Problem: RH KNOWLEDGE DEFICIT GENERAL Goal: RH STG INCREASE KNOWLEDGE OF SELF CARE AFTER HOSPITALIZATION Description: Information on self care after hospitalization will increase/improve with min assist Outcome: Progressing   

## 2018-12-22 NOTE — Progress Notes (Signed)
Physical Therapy Session Note  Patient Details  Name: Curtis [name] MRN: [id number] Date of Birth: 02/27/1951  Today's Date: 12/22/2018 PT Individual Time: 0907-1003 PT Individual Time Calculation (min): 56 min   Short Term Goals: Week 1:  PT Short Term Goal 1 (Week 1): Pt will perform supine<>sit w/ CGA PT Short Term Goal 1 - Progress (Week 1): Met PT Short Term Goal 2 (Week 1): Pt will perform bed<>chair with CGA PT Short Term Goal 2 - Progress (Week 1): Met PT Short Term Goal 3 (Week 1): Pt will ambulate at least 48' using LRAD w/ CGA PT Short Term Goal 3 - Progress (Week 1): Met PT Short Term Goal 4 (Week 1): Pt will ascend/descend 8 steps using B HRs w/ CGA PT Short Term Goal 4 - Progress (Week 1): Met Week 2:  PT Short Term Goal 1 (Week 2): =LTGs due to ELOS  Skilled Therapeutic Interventions/Progress Updates:   Received pt supine in bed, pt agreeable to PT and denied any pain during today's session. Session focused on functional mobility/transfers, ambulation, LE strength, balance, and improved endurance. Pt performed bed mobility with supervision. PT donned ted hose and shoes max A with pt sitting EOB. Pt transferred bed<>WC with RW and CGA with verbal cues to maintain sternal precautions. Pt ambulated 269f with RW CGA. Pt demonstrated decreased step length and narrow BOS. Pt challenged with vertical and horizontal head turns and demonstrated decrease in gait speed and minor LOB with activity. Pt ambulated 1680fwithout AD CGA with continued narrow BOS and decreased step length. Pt ambulated 2098fo steps without AD CGA. Pt navigated 8 steps x1 trial and 4 steps x 1 trial, due to fatigue unable to do more steps this set, with BUE support and CGA ascending and descending with a reciprocal pattern. Pt requested rest breaks in between trials. O2 96% HR 100bpm after stairs. Pt attempted 6MWT with RW CGA but was only able to walk 2 minutes 8 seconds for a total distance of [id number] needing  to sit. O2 98% and HR 110bpm after ambulating. Pt performed WC mobility with LE's only for [id number] to room. Concluded session with pt supine in bed despite encouragement to sit up, needs within reach, and bed alarm on.   Therapy Documentation Precautions:  Precautions Precautions: Fall, Sternal Restrictions Weight Bearing Restrictions: Yes(sternal precautions) Other Position/Activity Restrictions: sternal precautions   Therapy/Group: Individual Therapy  Anna VeniceDPT   CarlyPage Spiro DPT 12/22/2018, 7:55 AM

## 2018-12-23 ENCOUNTER — Inpatient Hospital Stay (HOSPITAL_COMMUNITY): Payer: Medicare Other

## 2018-12-23 ENCOUNTER — Inpatient Hospital Stay (HOSPITAL_COMMUNITY): Payer: Medicare Other | Admitting: Physical Therapy

## 2018-12-23 LAB — GLUCOSE, CAPILLARY
Glucose-Capillary: 108 mg/dL — ABNORMAL HIGH (ref 70–99)
Glucose-Capillary: 139 mg/dL — ABNORMAL HIGH (ref 70–99)
Glucose-Capillary: 129 mg/dL — ABNORMAL HIGH (ref 70–99)

## 2018-12-23 MED ORDER — OXYCODONE HCL 5 MG PO TABS: 5.0000 mg | ORAL_TABLET | ORAL | 0 refills | Status: DC | PRN

## 2018-12-23 MED ORDER — ROSUVASTATIN CALCIUM 40 MG PO TABS: 40.0000 mg | ORAL_TABLET | Freq: Every day | ORAL | 0 refills | Status: DC

## 2018-12-23 MED ORDER — AMIODARONE HCL 200 MG PO TABS: 200.0000 mg | ORAL_TABLET | Freq: Two times a day (BID) | ORAL | 0 refills | Status: DC

## 2018-12-23 MED ORDER — PANTOPRAZOLE SODIUM 40 MG PO TBEC: 40.0000 mg | DELAYED_RELEASE_TABLET | Freq: Every day | ORAL | 0 refills | Status: DC

## 2018-12-23 MED ORDER — GUAIFENESIN ER 600 MG PO TB12: 600.0000 mg | ORAL_TABLET | Freq: Two times a day (BID) | ORAL | 0 refills | Status: DC

## 2018-12-23 MED ORDER — TRAMADOL HCL 50 MG PO TABS: 50.0000 mg | ORAL_TABLET | ORAL | 0 refills | Status: DC | PRN

## 2018-12-23 MED ORDER — DICLOFENAC SODIUM 1 % TD GEL: 2.0000 g | Freq: Three times a day (TID) | TRANSDERMAL | 0 refills | Status: DC

## 2018-12-23 MED ORDER — SACUBITRIL-VALSARTAN 24-26 MG PO TABS: 1.0000 | ORAL_TABLET | Freq: Two times a day (BID) | ORAL | 0 refills | Status: DC

## 2018-12-23 MED ORDER — SPIRONOLACTONE 25 MG PO TABS: 25.0000 mg | ORAL_TABLET | Freq: Every day | ORAL | 0 refills | Status: DC

## 2018-12-23 NOTE — Progress Notes (Signed)
[facility] PHYSICAL MEDICINE & REHABILITATION PROGRESS NOTE   Subjective/Complaints: Slept well. In reasonable spirits today. Anxious to [place]  ROS: Patient denies fever, rash, sore throat, blurred vision, nausea, vomiting, diarrhea, cough, shortness of breath or chest pain, joint or back pain, headache, or mood change.     Objective:   No results found. Recent Labs    12/21/18 0757  WBC 14.4*  HGB 12.4*  HCT 38.1*  PLT 354   Recent Labs    12/21/18 0757  NA 135  K 3.9  CL 99  CO2 23  GLUCOSE 147*  BUN 13  CREATININE 1.46*  CALCIUM 8.9    Intake/Output Summary (Last 24 hours) at 12/23/2018 1218 Last data filed at 12/23/2018 0830 Gross per 24 hour  Intake 580 ml  Output 500 ml  Net 80 ml     Physical Exam: Vital Signs Blood pressure 125/65, pulse 84, temperature 97.6 F (36.4 C), temperature source Oral, resp. rate 17, height 5\' 8"  (1.727 m), weight 78.8 kg, SpO2 96 %. Constitutional: No distress . Vital signs reviewed. HEENT: EOMI, oral membranes moist Neck: supple Cardiovascular: RRR without murmur. No JVD    Respiratory: CTA Bilaterally without wheezes or rales. Normal effort    GI: BS +, non-tender, non-distended  Skin: Sternotomy site C/D/I , abdominal sutures removed. Areas healing nicely Psych: Normal mood.  Normal behavior. Musc: No pain, full ROM Neurologic: Alert Motor: Bilateral upper extremities 5/5 proximal distal, stable Bilateral lower extremities: Hip flexion, knee extension 4 --4/5, ankle dorsiflexion 4/5   Assessment/Plan: 1. Functional deficits secondary to Debility following CABG  which require 3+ hours per day of interdisciplinary therapy in a comprehensive inpatient rehab setting.  Physiatrist is providing close team supervision and 24 hour management of active medical problems listed below.  Physiatrist and rehab team continue to assess barriers to discharge/monitor patient progress toward functional and medical goals  Care  Tool:  Bathing  Bathing activity did not occur: Refused Body parts bathed by patient: Right arm, Left arm, Chest, Abdomen, Front perineal area, Buttocks, Right upper leg, Left upper leg, Face, Right lower leg, Left lower leg   Body parts bathed by helper: Right lower leg, Left lower leg     Bathing assist Assist Level: Contact Guard/Touching assist     Upper Body Dressing/Undressing Upper body dressing   What is the patient wearing?: Pull over shirt    Upper body assist Assist Level: Set up assist    Lower Body Dressing/Undressing Lower body dressing      What is the patient wearing?: Pants     Lower body assist Assist for lower body dressing: Contact Guard/Touching assist     Toileting Toileting    Toileting assist Assist for toileting: Minimal Assistance - Patient > 75%     Transfers Chair/bed transfer  Transfers assist     Chair/bed transfer assist level: Supervision/Verbal cueing Chair/bed transfer assistive device: Programmer, multimedia   Ambulation assist      Assist level: Contact Guard/Touching assist Assistive device: Walker-rolling Max distance: 211ft   Walk 10 feet activity   Assist     Assist level: Contact Guard/Touching assist Assistive device: Walker-rolling   Walk 50 feet activity   Assist    Assist level: Contact Guard/Touching assist Assistive device: Walker-rolling    Walk 150 feet activity   Assist Walk 150 feet activity did not occur: Safety/medical concerns  Assist level: Contact Guard/Touching assist Assistive device: Walker-rolling    Walk 10 feet  on uneven surface  activity   Assist     Assist level: Contact Guard/Touching assist Assistive device: Photographer Will patient use wheelchair at discharge?: Yes Type of Wheelchair: Manual    Wheelchair assist level: Supervision/Verbal cueing Max wheelchair distance: 189ft with bilateral LE's    Wheelchair 50  feet with 2 turns activity    Assist        Assist Level: Supervision/Verbal cueing   Wheelchair 150 feet activity     Assist          Blood pressure 125/65, pulse 84, temperature 97.6 F (36.4 C), temperature source Oral, resp. rate 17, height 5\' 8"  (1.727 m), weight 78.8 kg, SpO2 96 %.    Medical Problem List and Plan:  1. Debility secondary to CAD with non-STEMI status post CABG with MVR 11/30/2018 with postoperative mediastinal bleeding/cardiogenic shock/acute respiratory failure with removal of Impella left ventricular assistive device evacuation of left hemothorax 12/04/2018. Sternal precautions   Continue CIR    2. Antithrombotics:  -DVT/anticoagulation: Lovenox. Monitor for any bleeding episodes  -antiplatelet therapy: Aspirin 325 mg daily  3. Pain Management: Tramadol as needed   -xrays of left ankle demonstrate mild OA, osteophytes   -continue voltaren gel----much improved 4. Mood: Provide emotional support  -antipsychotic agents: N/A  5. Neuropsych: This patient is? Fully capable of making decisions on his own behalf.  6. Skin/Wound Care: Routine skin checks  7. Fluids/Electrolytes/Nutrition: Routine in and outs.  Discussed importance of adequate PO intake,especially protein. Intake has been sporadic (0-100%)  -Cr 1.46    -f/u labs Thursday 8. Acute blood loss anemia. CBC ordered  9. Hypertension. Entresto 24-26 mg twice daily, Aldactone 25 mg daily, Demadex 20 mg daily reduced to 10mg     Vitals:   12/22/18 2017 12/23/18 0625  BP: (!) 144/77 125/65  Pulse: 87 84  Resp: 19 17  Temp: 98 F (36.7 C) 97.6 F (36.4 C)  SpO2: 99% 96%   Controlled on 10/21 10. PAF. Amiodarone 200 mg daily. Cardiac rate controlled. Monitor with increased physical exertion.  11. Leukocytosis. recurrent.   WBCs 14.4 10/19  Afebrile  Urine culture blood cultures no growth-final  - Empiric vancomycin and cefepime discontinued.   -continue to monitor serially.   -afebrile,  wounds clean  -check cbc tomorrow 12. Prediabetes. Levemir 5 units stopped  -add oral agent if needed.  -checking cbg's tid with meals  -continue just diet control CBG (last 3)  Recent Labs    12/22/18 1732 12/23/18 0622 12/23/18 1158  GLUCAP 114* 108* 139*   13. Combined CHF   EF of 25-30%   Daily weights   Filed Weights   12/21/18 0402 12/22/18 0527 12/23/18 0500  Weight: 81.1 kg 82 kg 78.8 kg   Stable on 10/18  -may need to be sl "dry" to maintain ideal weight  -continue demadex 10mg  14.  Hyponatremia  Sodium 135 10/19    LOS: 12 days A FACE TO FACE EVALUATION WAS PERFORMED  11/18 12/23/2018, 12:18 PM

## 2018-12-23 NOTE — Progress Notes (Signed)
Occupational Therapy Session Note  Patient Details  Name: Curtis [name] MRN: [id number] Date of Birth: 01/14/1951  Today's Date: 12/23/2018 OT Individual Time: 0700-0711 OT Individual Time Calculation (min): 11 min    Short Term Goals: Week 1:  OT Short Term Goal 1 (Week 1): Pt will be able to stand up following sternal precautions with CGA. OT Short Term Goal 1 - Progress (Week 1): Met OT Short Term Goal 2 (Week 1): Pt will be able to transfer to toilet following sternal precautions with CGA. OT Short Term Goal 2 - Progress (Week 1): Met OT Short Term Goal 3 (Week 1): Pt will bathe LB with CGA. OT Short Term Goal 3 - Progress (Week 1): Met OT Short Term Goal 4 (Week 1): Pt will dress LB with CGA. OT Short Term Goal 4 - Progress (Week 1): Met  Skilled Therapeutic Interventions/Progress Updates:    1:1. Pt received in bed asking "what time is it" when OT walked in room. OT alerted pt to 7am time and pt very resistant to tx. Despite education on max ed for participation in tx to improve function and POC, pt continues to refuse tx. Pt missed 49 min d/t refusal  Therapy Documentation Precautions:  Precautions Precautions: Fall, Sternal Restrictions Weight Bearing Restrictions: Yes(sternal) Other Position/Activity Restrictions: sternal precautions General:   Vital Signs: Therapy Vitals Temp: 97.6 F (36.4 C) Temp Source: Oral Pulse Rate: 84 Resp: 17 BP: 125/65 Patient Position (if appropriate): Lying Oxygen Therapy SpO2: 96 % O2 Device: Room Air Pain:   ADL: ADL Eating: Set up Grooming: Setup Where Assessed-Grooming: Sitting at sink Upper Body Bathing: Setup Where Assessed-Upper Body Bathing: Sitting at sink Lower Body Bathing: Moderate assistance Where Assessed-Lower Body Bathing: Sitting at sink, Standing at sink Upper Body Dressing: Setup Where Assessed-Upper Body Dressing: Wheelchair Lower Body Dressing: Moderate assistance Where Assessed-Lower Body Dressing:  Wheelchair Toileting: Moderate assistance Where Assessed-Toileting: Glass blower/designer: Moderate assistance Toilet Transfer Method: Stand pivot Toilet Transfer Equipment: Systems analyst    Praxis   Exercises:   Other Treatments:     Therapy/Group: Individual Therapy  Tonny Branch 12/23/2018, 7:18 AM

## 2018-12-23 NOTE — Plan of Care (Signed)
  Problem: Consults Goal: RH GENERAL PATIENT EDUCATION Description: See Patient Education module for education specifics. Outcome: Progressing Goal: Skin Care Protocol Initiated - if Braden Score 18 or less Description: If consults are not indicated, leave blank or document N/A Outcome: Progressing Goal: Nutrition Consult-if indicated Outcome: Progressing Goal: Diabetes Guidelines if Diabetic/Glucose > 140 Description: If diabetic or lab glucose is > 140 mg/dl - Initiate Diabetes/Hyperglycemia Guidelines & Document Interventions  Outcome: Progressing   Problem: RH BLADDER ELIMINATION Goal: RH STG MANAGE BLADDER WITH ASSISTANCE Description: STG Manage Bladder With min Assistance Outcome: Progressing   Problem: RH SKIN INTEGRITY Goal: RH STG SKIN FREE OF INFECTION/BREAKDOWN Description: No skin breakdown or wound infection while in rehab with min assist Outcome: Progressing Goal: RH STG MAINTAIN SKIN INTEGRITY WITH ASSISTANCE Description: STG Maintain Skin Integrity With min Assistance. Outcome: Progressing Goal: RH STG ABLE TO PERFORM INCISION/WOUND CARE W/ASSISTANCE Description: STG Able To Perform Incision/Wound Care With min  assistance. Outcome: Progressing   Problem: RH SAFETY Goal: RH STG ADHERE TO SAFETY PRECAUTIONS W/ASSISTANCE/DEVICE Description: STG Adhere to Safety Precautions With min Assistance/Device. Outcome: Progressing Goal: RH STG DECREASED RISK OF FALL WITH ASSISTANCE Description: STG Decreased Risk of Fall With min assistance. Outcome: Progressing   Problem: RH PAIN MANAGEMENT Goal: RH STG PAIN MANAGED AT OR BELOW PT'S PAIN GOAL Description: Pain <3 Outcome: Progressing   Problem: RH KNOWLEDGE DEFICIT GENERAL Goal: RH STG INCREASE KNOWLEDGE OF SELF CARE AFTER HOSPITALIZATION Description: Information on self care after hospitalization will increase/improve with min assist Outcome: Progressing   

## 2018-12-23 NOTE — Progress Notes (Signed)
Physical Therapy Session Note  Patient Details  Name: Curtis [name] MRN: [id number] Date of Birth: [birth year]  Today's Date: 12/23/2018 PT Individual Time: 0915-0953 PT Individual Time Calculation (min): 38 min   Short Term Goals: Week 2:  PT Short Term Goal 1 (Week 2): =LTGs due to ELOS  Skilled Therapeutic Interventions/Progress Updates:   Pt in supine and agreeable to therapy, no c/o pain. Pt worried about being dizzy w/ upright mobility this session. Supine>sit w/ supervision and verbal cues for sternal precautions. Donned pants w/ supervision and TEDs w/ total assist. Stand pivot to w/c w/ supervision. Pt denied dizziness, BP 126/69 after transfer. Total assist w/c transport to/from therapy gym. Performed Berg Balance Scale as detailed below, scored 39/56 and explained significance of results to pt. Discussed importance of RW use and keeping 1 RW on first floor and 1 on 2nd floor. Pt states his mom has a RW that she does not use and he can leave that on the 2nd floor. Pt verbalizes understanding of importance of AD use at d/c. BP 125/102 after Curtis [name], retook and it was 113/65, denied dizziness. Ongoing education regarding energy conservation strategies for household mobility, use of TED hose at home, and limiting his community mobility at this time. Pt verbalized understanding and in agreement. Ended session in supine, all needs in reach.   Therapy Documentation Precautions:  Precautions Precautions: Fall, Sternal Restrictions Weight Bearing Restrictions: Yes(sternal precaution) Other Position/Activity Restrictions: sternal precautions Vital Signs: Therapy Vitals Temp: 97.6 F (36.4 C) Temp Source: Oral Pulse Rate: 84 Resp: 17 BP: 125/65 Patient Position (if appropriate): Lying Oxygen Therapy SpO2: 96 % O2 Device: Room Air Pain: Pain Assessment Pain Scale: 0-10 Pain Score: 0-No pain Balance: Standardized Balance Assessment Standardized Balance Assessment: Berg Balance  Test Berg Balance Test Sit to Stand: Able to stand without using hands and stabilize independently Standing Unsupported: Able to stand 2 minutes with supervision Sitting with Back Unsupported but Feet Supported on Floor or Stool: Able to sit safely and securely 2 minutes Stand to Sit: Sits safely with minimal use of hands Transfers: Able to transfer safely, minor use of hands Standing Unsupported with Eyes Closed: Able to stand 10 seconds with supervision Standing Ubsupported with Feet Together: Able to place feet together independently and stand for 1 minute with supervision From Standing, Reach Forward with Outstretched Arm: Reaches forward but needs supervision From Standing Position, Pick up Object from Floor: Able to pick up shoe, needs supervision From Standing Position, Turn to Look Behind Over each Shoulder: Looks behind one side only/other side shows less weight shift Turn 360 Degrees: Able to turn 360 degrees safely but slowly Standing Unsupported, Alternately Place Feet on Step/Stool: Able to complete 4 steps without aid or supervision Standing Unsupported, One Foot in Front: Able to take small step independently and hold 30 seconds Standing on One Leg: Tries to lift leg/unable to hold 3 seconds but remains standing independently Total Score: 39  Therapy/Group: Individual Therapy  Curtis [name] Curtis [name] 12/23/2018, 9:54 AM

## 2018-12-23 NOTE — Progress Notes (Signed)
Occupational Therapy Session Note  Patient Details  Name: Curtis [name] MRN: [id number] Date of Birth: 21-Apr-1950  Today's Date: 12/23/2018 OT Individual Time: Session 1: 1130-1200               Session 2: 300-330 OT Individual Time Calculation (min):     Session 1: 30 mins      Session 2: 30 mins   Short Term Goals: Week 2:  OT Short Term Goal 1 (Week 2): STG=LTG 2/2 ELOS      Skilled Therapeutic Interventions/Progress Updates:  Session 1: Pt received in supine with no c/o pain. Pt transfers EOB with (S) and don shoes with shoe horn and (S). Pt completes functional mobility with RW and CGA to therapy gym. Pt completes functional tasks addressing functional use of B UE in standing and dynamic standing balance; activity graded up with pt standing on foam wedge to complete task to mimic community environment. Pt completes cone toe tapping exercise with three cones placed in front to address functional dynamic standing balance. Pt completes activity in standing with CGA and able to touch cones on command from therapist with L and R LE. Exited session pt in bed, bed alarm set and all needs met.   Session 2: Pt received in bed supine with no c/o pain. Pt dons shoes with (S) and use of shoe horn. Completes functional mobility with CGA to therapy apartment to practice transfers and sit <> stand in a home environment in preparation for d/c. Pt competes sit <> stand from couch with CGA and is able to maintain sternal precautions while doing so. Pt completed sit <> stand 3x from therapy apartment couch. Pt practices bed mobility in preparation for bed at home; completed sitting EOB <> supine with (S) and supine <> EOB with (S). OT educated on importance of bringing legs off side of bed prior to sitting up to help gain leverage and minimize pushing through arms. Pt remains inable to shower but reports he does not want a TTB for home when he is appropriate. Pt practiced entering and exiting bathtub in therapy  apartment in preparation for showering in the future at home. Pt educated on side stepping into the shower for safety. Exited session pt in bed, bed alarm set and all needs met.      Therapy Documentation Precautions:  Precautions Precautions: Fall, Sternal Restrictions Weight Bearing Restrictions: Yes(sternal precaution) Other Position/Activity Restrictions: sternal precautions   Therapy/Group: Individual Therapy  Doy Taaffe 12/23/2018, 3:35 PM

## 2018-12-23 NOTE — Discharge Summary (Signed)
Physician Discharge Summary  Patient ID: Curtis [name] MRN: [id number] DOB/AGE: [id number] 66 y.o.  Admit date: 12/11/2018 Discharge date: 12/25/2018  Discharge Diagnoses:  Principal Problem:   Debility Active Problems:   Hyponatremia   Labile blood glucose   Elevated serum creatinine   Acute on chronic combined systolic and diastolic congestive heart failure (HCC)   Pain DVT prophylaxis Acute blood loss anemia Hypertension PAF Prediabetes  Discharged Condition: Stable  Significant Diagnostic Studies: Dg Chest 1 View  Result Date: 12/04/2018 CLINICAL DATA:  Open heart surgery EXAM: CHEST  1 VIEW COMPARISON:  12/04/2018, 8:20 a.m. FINDINGS: Interval repositioning of a left-sided chest tube with resolution of a previously seen fluid collection about the left upper lobe. There is likely a small, layering left pleural effusion. Interval repositioning of right-sided chest tube. Mediastinal drainage tubes remain in position. The right lung is normally aerated. There has been interval endotracheal intubation, tube tip over the mid trachea. Esophagogastric tube with tip and side port below the diaphragm. Right neck pulmonary artery catheter remains with tip projecting over the pulmonary outflow tract. Interval removal of Impella pump. Cardiomegaly status post median sternotomy with aortic valve prosthesis. IMPRESSION: 1. Interval repositioning of a left-sided chest tube with resolution of a previously seen fluid collection about the left upper lobe. There is likely a small, layering left pleural effusion. Interval repositioning of right-sided chest tube. Mediastinal drainage tubes remain in position. The right lung is normally aerated. 2. There has been interval endotracheal intubation, tube tip over the mid trachea. Esophagogastric tube with tip and side port below the diaphragm. Right neck pulmonary artery catheter remains with tip projecting over the pulmonary outflow tract. 3.  Interval removal of  Impella pump. 4. Cardiomegaly status post median sternotomy with aortic valve prosthesis. Electronically Signed   By: Lauralyn PrimesAlex  Bibbey M.D.   On: 12/04/2018 16:59   Dg Chest 1 View  Result Date: 12/02/2018 CLINICAL DATA:  Postoperative status post CABG. EXAM: CHEST  1 VIEW COMPARISON:  12/01/2018 FINDINGS: Endotracheal tube remains in place. Enteric tube coursing off the field of the radiograph. Stable position of the right-sided Swan-Ganz catheter and Impella device since previous exam. Signs of mitral valve replacement as before. Bilateral chest tubes remaining in place without signs of visible pneumothorax. Signs of mild basilar atelectasis persist. Epicardial pacer wires project over the upper abdomen. Cardiomediastinal contours are unchanged with postoperative changes of sternotomy, skin staples in place. IMPRESSION: Unchanged appearance of Impella device, projecting somewhat medially but unchanged in position. Stable appearance of prominence of superior mediastinum presumably postoperative. Electronically Signed   By: Donzetta KohutGeoffrey  Wile M.D.   On: 12/02/2018 08:08   Dg Chest 1 View  Result Date: 12/01/2018 CLINICAL DATA:  Status post open heart surgery. EXAM: CHEST  1 VIEW COMPARISON:  Radiograph yesterday at 15:35 p.m. FINDINGS: Endotracheal tube tip 2.3 cm from the carina. Tip and side port of enteric tube below the diaphragm in the stomach. Right internal jugular Swan-Ganz catheter tip in the region of the main pulmonary outflow tract. Left and right chest tubes and mediastinal drains in place. Impella device in place. Post median sternotomy. Cardiomegaly with prosthetic valve. Similar upper mediastinal widening. Right apical opacity may be layering pleural fluid. No visualized pneumothorax. Increased right infrahilar opacity favors atelectasis. IMPRESSION: 1. Support apparatus as described. 2. Similar upper mediastinal widening, patient had tortuous vasculature on preoperative imaging. 3. Increased right  infrahilar opacity favors atelectasis. Probable right pleural effusion. Electronically Signed   By: Ivette LoyalMelanie  Sanford M.D.  On: 12/01/2018 00:28   Dg Chest 2 View  Result Date: 12/11/2018 CLINICAL DATA:  Pleural effusion EXAM: CHEST - 2 VIEW COMPARISON:  Two days ago FINDINGS: Chronic cardiomegaly. Mitral valve replacement. CABG. Right PICC with tip at the SVC. Chronic mild interstitial coarsening. There is no edema, consolidation, or pneumothorax. Trace pleural fluid on both sides. Hyperinflation. IMPRESSION: Trace pleural effusions. Interval removal of chest tube with no visible pneumothorax. Electronically Signed   By: Monte Fantasia M.D.   On: 12/11/2018 10:47   Dg Chest 2 View  Result Date: 11/29/2018 CLINICAL DATA:  Pulmonary infiltrate. EXAM: CHEST - 2 VIEW COMPARISON:  November 23, 2018 FINDINGS: The cardiomediastinal silhouette is stable. Minimal opacity remains in the left base. Otherwise, infiltrates have resolved. IMPRESSION: Minimal opacity remains in the left base. Otherwise, infiltrates have resolved. No other changes. Electronically Signed   By: Dorise Bullion III M.D   On: 11/29/2018 19:57   Dg Ankle 2 Views Left  Result Date: 12/16/2018 CLINICAL DATA:  Pain, no injury EXAM: LEFT ANKLE - 2 VIEW COMPARISON:  None. FINDINGS: No fracture or dislocation of the left ankle. There is mild medial ankle arthrosis. Small plantar and Achilles calcaneal spurs. Soft tissues are unremarkable. IMPRESSION: No fracture or dislocation of the left ankle. There is mild medial ankle arthrosis. Small plantar and Achilles calcaneal spurs. Soft tissues are unremarkable. Electronically Signed   By: Eddie Candle M.D.   On: 12/16/2018 12:13   Ct Head Wo Contrast  Result Date: 12/07/2018 CLINICAL DATA:  Altered level of consciousness. EXAM: CT HEAD WITHOUT CONTRAST TECHNIQUE: Contiguous axial images were obtained from the base of the skull through the vertex without intravenous contrast. COMPARISON:  None.  FINDINGS: Brain: No evidence of acute infarction, hemorrhage, extra-axial collection, ventriculomegaly, or mass effect. Small left basal ganglia lacunar infarct. Generalized cerebral atrophy. Periventricular white matter low attenuation likely secondary to microangiopathy. Vascular: Cerebrovascular atherosclerotic calcifications are noted. Skull: Negative for fracture or focal lesion. Sinuses/Orbits: Visualized portions of the orbits are unremarkable. Mastoid sinuses are clear. Mucous retention cyst in bilateral maxillary sinuses. Other: None. IMPRESSION: 1. No acute intracranial pathology. 2. Chronic microvascular disease and cerebral atrophy. Electronically Signed   By: Kathreen Devoid   On: 12/07/2018 09:06   Ct Chest Wo Contrast  Result Date: 11/27/2018 CLINICAL DATA:  68 year old male undergoing preoperative evaluation prior to CABG. Assess for aortic disease. EXAM: CT CHEST WITHOUT CONTRAST TECHNIQUE: Multidetector CT imaging of the chest was performed following the standard protocol without IV contrast. COMPARISON:  None. FINDINGS: Cardiovascular: Limited evaluation in the absence of intravenous contrast. Two vessel aortic arch. The right brachiocephalic and left common carotid artery arise from a common origin. The aortic root is normal in caliber. The ascending thoracic aorta is mildly ectatic at 3.7 cm. Mild atherosclerotic calcifications are present throughout the aorta. Extensive coronary artery disease with calcifications throughout all visualized coronary arteries. Mild cardiomegaly. Trace pericardial fluid versus thickening anteriorly. The pulmonary artery is normal in caliber. Mediastinum/Nodes: Unremarkable CT appearance of the thyroid gland. No suspicious mediastinal or hilar adenopathy. No soft tissue mediastinal mass. The thoracic esophagus is unremarkable. Nonspecific 0.8 cm paraesophageal lymph node. Lungs/Pleura: Mild centrilobular pulmonary emphysema. Diffuse mild bronchial wall thickening.  Mild dependent atelectasis. Tiny calcified subpleural granuloma in the periphery of the left upper lobe noted incidentally. This is a benign finding. Upper Abdomen: Approximately 6 mm low-attenuation lesion in the medial hepatic dome is incompletely characterized in the absence of intravenous contrast. Statistically, this is likely  a benign cyst. No acute abnormality within the visualized upper abdomen. Musculoskeletal: No acute fracture or aggressive appearing lytic or blastic osseous lesion. Multilevel degenerative endplate spurring. IMPRESSION: 1. Ectatic ascending thoracic aorta with a maximal diameter of 3.7 cm. 2. Scattered mild aortic atherosclerotic calcifications. Aortic Atherosclerosis (ICD10-170.0) 3. Mild pericardial fluid versus thickening in the anterior pericardium. 4. Common origin of the right brachiocephalic and left common carotid arteries (commonly called a bovine arch). 5. Mild centrilobular pulmonary emphysema and diffuse mild bronchial wall thickening suggests underlying COPD. 6. Additional ancillary findings as above. Electronically Signed   By: Malachy Moan M.D.   On: 11/27/2018 09:57   Ct Angio Chest Pe W Or Wo Contrast  Result Date: 12/07/2018 CLINICAL DATA:  Hypoxia, abdominal distention. EXAM: CT ANGIOGRAPHY CHEST CT ABDOMEN AND PELVIS WITH CONTRAST TECHNIQUE: Multidetector CT imaging of the chest was performed using the standard protocol during bolus administration of intravenous contrast. Multiplanar CT image reconstructions and MIPs were obtained to evaluate the vascular anatomy. Multidetector CT imaging of the abdomen and pelvis was performed using the standard protocol during bolus administration of intravenous contrast. CONTRAST:  OMNIPAQUE IOHEXOL 350 MG/ML SOLN COMPARISON:  CT scan of November 27, 2018. FINDINGS: CTA CHEST FINDINGS Cardiovascular: Satisfactory opacification of the pulmonary arteries to the segmental level. No evidence of pulmonary embolism. Mild  cardiomegaly is noted. Atherosclerosis of thoracic aorta is noted without aneurysm formation. Status post mitral valve repair. Coronary artery calcifications are noted. No pericardial effusion. Mediastinum/Nodes: The esophagus is unremarkable. No significant adenopathy is noted. Thyroid gland is unremarkable. Mediastinal drain is noted anteriorly. Lungs/Pleura: Bilateral chest tubes are noted without definite pneumothorax. Mild bilateral lower lobe subsegmental atelectasis is noted. Musculoskeletal: Sternotomy wires are noted. No other significant osseous abnormality is noted. Review of the MIP images confirms the above findings. CT ABDOMEN and PELVIS FINDINGS Hepatobiliary: No focal liver abnormality is seen. No gallstones, gallbladder wall thickening, or biliary dilatation. Pancreas: Unremarkable. No pancreatic ductal dilatation or surrounding inflammatory changes. Spleen: Normal in size without focal abnormality. Adrenals/Urinary Tract: Adrenal glands appear normal. 13 mm calculus is noted in upper pole collecting system of left kidney. 8 mm calculus is also noted in upper pole of left kidney. Smaller nonobstructive calculus is seen in lower pole collecting system of right kidney. 1.9 cm rounded hyperdense abnormality is seen arising from lower pole right kidney. Large left parapelvic cysts are noted. No definite hydronephrosis or renal obstruction is noted. Urinary bladder catheter is noted. Stomach/Bowel: Stomach is within normal limits. Appendix appears normal. No evidence of bowel wall thickening, distention, or inflammatory changes. Vascular/Lymphatic: Aortic atherosclerosis. No enlarged abdominal or pelvic lymph nodes. Reproductive: Mild prostatic enlargement is noted. Other: No abdominal wall hernia or abnormality. No abdominopelvic ascites. Musculoskeletal: No acute or significant osseous findings. Review of the MIP images confirms the above findings. IMPRESSION: No definite evidence of pulmonary embolus.  Bilateral chest tubes are noted without pneumothorax. Mild bilateral posterior basilar subsegmental atelectasis is noted. 1.9 cm rounded hyperdense abnormality is seen arising from lower pole of right kidney which may represent hyperdense cyst, but neoplasm cannot be excluded. Ultrasound is recommended for further evaluation. Large nonobstructive left renal calculi are noted. Large left renal pelvic cysts are noted. Mild prostatic enlargement. Aortic Atherosclerosis (ICD10-I70.0). Electronically Signed   By: Lupita Raider M.D.   On: 12/07/2018 09:15   Ct Abdomen Pelvis W Contrast  Result Date: 12/07/2018 CLINICAL DATA:  Hypoxia, abdominal distention. EXAM: CT ANGIOGRAPHY CHEST CT ABDOMEN AND PELVIS WITH  CONTRAST TECHNIQUE: Multidetector CT imaging of the chest was performed using the standard protocol during bolus administration of intravenous contrast. Multiplanar CT image reconstructions and MIPs were obtained to evaluate the vascular anatomy. Multidetector CT imaging of the abdomen and pelvis was performed using the standard protocol during bolus administration of intravenous contrast. CONTRAST:  OMNIPAQUE IOHEXOL 350 MG/ML SOLN COMPARISON:  CT scan of November 27, 2018. FINDINGS: CTA CHEST FINDINGS Cardiovascular: Satisfactory opacification of the pulmonary arteries to the segmental level. No evidence of pulmonary embolism. Mild cardiomegaly is noted. Atherosclerosis of thoracic aorta is noted without aneurysm formation. Status post mitral valve repair. Coronary artery calcifications are noted. No pericardial effusion. Mediastinum/Nodes: The esophagus is unremarkable. No significant adenopathy is noted. Thyroid gland is unremarkable. Mediastinal drain is noted anteriorly. Lungs/Pleura: Bilateral chest tubes are noted without definite pneumothorax. Mild bilateral lower lobe subsegmental atelectasis is noted. Musculoskeletal: Sternotomy wires are noted. No other significant osseous abnormality is  noted. Review of the MIP images confirms the above findings. CT ABDOMEN and PELVIS FINDINGS Hepatobiliary: No focal liver abnormality is seen. No gallstones, gallbladder wall thickening, or biliary dilatation. Pancreas: Unremarkable. No pancreatic ductal dilatation or surrounding inflammatory changes. Spleen: Normal in size without focal abnormality. Adrenals/Urinary Tract: Adrenal glands appear normal. 13 mm calculus is noted in upper pole collecting system of left kidney. 8 mm calculus is also noted in upper pole of left kidney. Smaller nonobstructive calculus is seen in lower pole collecting system of right kidney. 1.9 cm rounded hyperdense abnormality is seen arising from lower pole right kidney. Large left parapelvic cysts are noted. No definite hydronephrosis or renal obstruction is noted. Urinary bladder catheter is noted. Stomach/Bowel: Stomach is within normal limits. Appendix appears normal. No evidence of bowel wall thickening, distention, or inflammatory changes. Vascular/Lymphatic: Aortic atherosclerosis. No enlarged abdominal or pelvic lymph nodes. Reproductive: Mild prostatic enlargement is noted. Other: No abdominal wall hernia or abnormality. No abdominopelvic ascites. Musculoskeletal: No acute or significant osseous findings. Review of the MIP images confirms the above findings. IMPRESSION: No definite evidence of pulmonary embolus. Bilateral chest tubes are noted without pneumothorax. Mild bilateral posterior basilar subsegmental atelectasis is noted. 1.9 cm rounded hyperdense abnormality is seen arising from lower pole of right kidney which may represent hyperdense cyst, but neoplasm cannot be excluded. Ultrasound is recommended for further evaluation. Large nonobstructive left renal calculi are noted. Large left renal pelvic cysts are noted. Mild prostatic enlargement. Aortic Atherosclerosis (ICD10-I70.0). Electronically Signed   By: Lupita Raider M.D.   On: 12/07/2018 09:15   Dg Chest Port  1 View  Result Date: 12/09/2018 CLINICAL DATA:  Pleural effusion.  CABG 5 days ago. EXAM: PORTABLE CHEST 1 VIEW COMPARISON:  CTA chest 12/07/2018 and one-view chest 12/07/2018. FINDINGS: Heart is enlarged. Moderate pulmonary vascular congestion is noted. A left-sided chest tube is in place. Bilateral chest tubes are in place. There is no pneumothorax. Right IJ sheath remains. A mediastinal drain is in place. IMPRESSION: 1. Cardiomegaly and moderate pulmonary vascular congestion. 2. Low lung volumes. 3. Support apparatus is stable.  No pneumothorax. Electronically Signed   By: Marin Roberts M.D.   On: 12/09/2018 08:17   Dg Chest Port 1 View  Result Date: 12/07/2018 CLINICAL DATA:  Status post cardiac surgery EXAM: PORTABLE CHEST 1 VIEW COMPARISON:  12/06/2018 FINDINGS: Cardiac shadow is mildly enlarged but stable. Postsurgical changes are again seen. Right jugular sheath is again noted and stable. Bilateral thoracostomy catheters and mediastinal drain are seen. No pneumothorax is noted.  Improved aeration is noted in the left base. IMPRESSION: Tubes and lines as described above. No focal infiltrate is noted. Electronically Signed   By: Alcide Clever M.D.   On: 12/07/2018 07:45   Dg Chest Port 1 View  Result Date: 12/06/2018 CLINICAL DATA:  Chest tube in place. EXAM: PORTABLE CHEST 1 VIEW COMPARISON:  12/05/2018 FINDINGS: Right IJ central venous sheath remains in place with tip over the SVC. Bilateral basilar chest tubes as well as mediastinal drain unchanged. Lungs are adequately inflated with stable left base opacification likely small effusion with associated atelectasis. Minimal linear atelectasis right base. No pneumothorax. Mild stable cardiomegaly. Remainder of the exam is unchanged. IMPRESSION: Stable small left effusion with associated basilar atelectasis. Minimal linear atelectasis right base. Tubes and lines as described. Electronically Signed   By: Elberta Fortis M.D.   On: 12/06/2018 08:26    Dg Chest Port 1 View  Result Date: 12/05/2018 CLINICAL DATA:  Removal of the left ventricular assist device. EXAM: PORTABLE CHEST 1 VIEW COMPARISON:  12/04/2018 FINDINGS: Swan-Ganz catheter with the tip projecting over the right ventricular outflow tract. Nasogastric tube projecting over the stomach. Endotracheal tube with the tip 2.4 cm above the carina. Bilateral chest tubes in unchanged position. No pneumothorax. Small left pleural effusion unchanged from the prior exam. Mild left basilar atelectasis. Stable cardiomediastinal silhouette. Prior median sternotomy, CABG, and valve replacement. IMPRESSION: 1. Support lines and tubing in satisfactory unchanged position. 2. Bilateral chest tubes without a pneumothorax. 3. Trace left pleural effusion and left basilar atelectasis. Electronically Signed   By: Elige Ko   On: 12/05/2018 07:24   Dg Chest Port 1 View  Result Date: 12/04/2018 CLINICAL DATA:  Status post coronary bypass grafting EXAM: PORTABLE CHEST 1 VIEW COMPARISON:  12/03/2018 FINDINGS: Cardiac shadow is enlarged. Postsurgical changes are again seen. Impella catheter is noted and stable in appearance. Swan-Ganz catheter is noted in the pulmonary outflow tract. Mediastinal drain and bilateral thoracostomy catheters are seen. No pneumothorax is noted although there is increased density in the left apex laterally when compared with the prior exam. This is consistent with a loculated fluid collection and is significantly larger than that seen on the prior exam now measuring approximately 8.8 by 5.4 cm in greatest dimension. Increase in left-sided pleural effusion is noted as well. No pneumothorax is noted. IMPRESSION: Increasing ovoid opacification in the lateral aspect of left upper hemithorax consistent with a loculated fluid collection. The possibility of focal hematoma deserves consideration. Slight increase in overall left pleural effusion inferiorly. Tubes and lines as described above. These  results will be called to the ordering clinician or representative by the Radiologist Assistant, and communication documented in the PACS or zVision Dashboard. Electronically Signed   By: Alcide Clever M.D.   On: 12/04/2018 08:42   Dg Chest Port 1 View  Result Date: 12/03/2018 CLINICAL DATA:  Shortness of breath tonight. Recent open heart surgery CABG. EXAM: PORTABLE CHEST 1 VIEW COMPARISON:  Radiograph yesterday. FINDINGS: Removal of endotracheal and enteric tubes. Impella device unchanged in positioning, slightly medially located. Right internal jugular Swan-Ganz catheter tip in the region of the main pulmonary outflow tract. Bilateral chest tubes and mediastinal drain in place. Overall lower lung volumes after extubation. Increasing peripheral opacity in the left upper lateral hemithorax that appears extrapleural. No pneumothorax. Unchanged heart size and mediastinal contours. Slight increasing bibasilar opacities favoring atelectasis. Increasing central lung opacities. IMPRESSION: 1. Lower lung volumes after extubation. 2. Developing peripheral opacity in the left upper  lateral hemithorax which appears extrapleural and likely represents partially loculated pleural fluid. Increasing bibasilar opacities/atelectasis. 3. Increasing central lung opacities may represent pulmonary edema. Electronically Signed   By: Narda Rutherford M.D.   On: 12/03/2018 01:54   Dg Chest Port 1 View  Result Date: 12/01/2018 CLINICAL DATA:  Postop day 1 status post CABG and subsequent evacuation of mediastinal hematoma. EXAM: PORTABLE CHEST 1 VIEW COMPARISON:  12/01/2018 at 12:10 a.m. FINDINGS: Endotracheal tube tip 5.3 cm above the carina. Right internal jugular Swan-Ganz catheter tip: Main pulmonary artery. Nasogastric tube enters the stomach. Bilateral chest tubes. Mitral valve prosthesis. An Impella assist device is noted with the outlet projecting over the mid cardiac shadow presumably proximally in the ascending aorta, and  the inlet projecting centrally over the lower portion of the cardiac shadow, presumably in a rightward portion of the left ventricle, although somewhat more medial than is occasionally encountered. Continued prominence the upper mediastinum potentially from hematoma or edema. No appreciable pneumothorax. No pneumomediastinum. Thoracic spondylosis noted. No significant pulmonary edema. There is mild atelectasis at both lung bases. IMPRESSION: 1. The Impella inlet is somewhat medial in position, and is presumably in the left ventricle but near the interventricular septum. The outlet projects over the ascending thoracic aorta in the expected position. Remaining tubes and lines appear unremarkable. 2. No pneumothorax or significant pulmonary edema. Mild atelectasis in the lung bases. 3. Continued prominence of the upper mediastinum potentially from edema or hematoma. Obscured AP window. Electronically Signed   By: Gaylyn Rong M.D.   On: 12/01/2018 08:20   Dg Chest Port 1 View  Result Date: 11/30/2018 CLINICAL DATA:  Status post CABG and mitral valve replacement EXAM: PORTABLE CHEST 1 VIEW COMPARISON:  November 29, 2018 FINDINGS: An ETT is in good position. A PA catheter terminates near the pulmonary outflow tract. Mediastinal and chest tubes are seen. No pneumothorax. No overt pulmonary edema or pulmonary infiltrate. IMPRESSION: 1. Support apparatus as above. 2. Postsurgical changes in the chest. Electronically Signed   By: Gerome Sam III M.D   On: 11/30/2018 15:48   Vas [place] Lower Extremity Saphenous Vein Mapping  Result Date: 11/30/2018 LOWER EXTREMITY VEIN MAPPING Indications:  Pre-op Risk Factors: Coronary artery disease.  Comparison Study: No prior study on file Performing Technologist: Sherren Kerns RVS  Examination Guidelines: A complete evaluation includes B-mode imaging, spectral Doppler, color Doppler, and power Doppler as needed of all accessible portions of each vessel. Bilateral testing  is considered an integral part of a complete examination. Limited examinations for reoccurring indications may be performed as noted. +---------------+-----------+----------------------+---------------+-----------+   RT Diameter  RT Findings         GSV            LT Diameter  LT Findings      (cm)                                            (cm)                  +---------------+-----------+----------------------+---------------+-----------+      0.56                     Saphenofemoral         0.56  [place]                                  +---------------+-----------+----------------------+---------------+-----------+      0.52                     Proximal thigh         0.42       branching  +---------------+-----------+----------------------+---------------+-----------+      0.51       branching       Mid thigh            0.44                  +---------------+-----------+----------------------+---------------+-----------+      0.26                      Distal thigh          0.40       branching  +---------------+-----------+----------------------+---------------+-----------+      0.21                          Knee              0.40                  +---------------+-----------+----------------------+---------------+-----------+      0.23       branching       Prox calf            0.29                  +---------------+-----------+----------------------+---------------+-----------+      0.26                        Mid calf            0.38       branching  +---------------+-----------+----------------------+---------------+-----------+      0.26                      Distal calf           0.26       branching  +---------------+-----------+----------------------+---------------+-----------+      0.23       branching         Ankle              0.28                   +---------------+-----------+----------------------+---------------+-----------+ Diagnosing physician: Fabienne Bruns MD Electronically signed by Fabienne Bruns MD on 11/30/2018 at 4:14:55 PM.    Final    Vas US Doppler Pre Cabg  Result Date: 11/30/2018 PREOPERATIVE VASCULAR EVALUATION  Risk Factors:     Hypertension, coronary artery disease. Comparison Study: No prior study on file Performing Technologist: Sherren Kerns RVS  Examination Guidelines: A complete evaluation includes B-mode imaging, spectral Doppler, color Doppler, and power Doppler as needed of all accessible portions of each vessel. Bilateral testing is considered an integral part of a complete examination. Limited examinations for reoccurring indications may be performed as noted.  Right Carotid Findings: +----------+--------+--------+--------+------------+------------------+           PSV cm/sEDV cm/sStenosisDescribe    Comments           +----------+--------+--------+--------+------------+------------------+ CCA Prox  50      9  intimal thickening +----------+--------+--------+--------+------------+------------------+ CCA Distal53      9                           intimal thickening +----------+--------+--------+--------+------------+------------------+ ICA Prox  57      10              heterogenous                   +----------+--------+--------+--------+------------+------------------+ ICA Distal54      12                                             +----------+--------+--------+--------+------------+------------------+ ECA       85      12                                             +----------+--------+--------+--------+------------+------------------+ Portions of this table do not appear on this page. +----------+--------+-------+--------+------------+           PSV cm/sEDV cmsDescribeArm Pressure +----------+--------+-------+--------+------------+ Subclavian51                      148          +----------+--------+-------+--------+------------+ +---------+--------+--+--------+--+ VertebralPSV cm/s37EDV cm/s12 +---------+--------+--+--------+--+ Left Carotid Findings: +----------+--------+--------+--------+------------+------------------+           PSV cm/sEDV cm/sStenosisDescribe    Comments           +----------+--------+--------+--------+------------+------------------+ CCA Prox  55      12                          intimal thickening +----------+--------+--------+--------+------------+------------------+ CCA Distal54      13                          intimal thickening +----------+--------+--------+--------+------------+------------------+ ICA Prox  70      18              heterogenous                   +----------+--------+--------+--------+------------+------------------+ ICA Distal62      11                                             +----------+--------+--------+--------+------------+------------------+ ECA       50      5                                              +----------+--------+--------+--------+------------+------------------+ +----------+--------+--------+--------+------------+ SubclavianPSV cm/sEDV cm/sDescribeArm Pressure +----------+--------+--------+--------+------------+           83                      151          +----------+--------+--------+--------+------------+ +---------+--------+--+--------+-+ VertebralPSV cm/s32EDV cm/s5 +---------+--------+--+--------+-+  ABI Findings: +--------+------------------+-----+---------+--------+ Right   Rt Pressure (mmHg)IndexWaveform Comment  +--------+------------------+-----+---------+--------+ [id number]  triphasic         +--------+------------------+-----+---------+--------+ PTA                            triphasic         +--------+------------------+-----+---------+--------+ DP                              triphasic         +--------+------------------+-----+---------+--------+ +--------+------------------+-----+---------+-------+ Left    Lt Pressure (mmHg)IndexWaveform Comment +--------+------------------+-----+---------+-------+ [id number]                    triphasic        +--------+------------------+-----+---------+-------+ PTA                            triphasic        +--------+------------------+-----+---------+-------+ DP                             triphasic        +--------+------------------+-----+---------+-------+  Right Doppler Findings: +-----------+--------+-----+---------+--------+ Site       PressureIndexDoppler  Comments +-----------+--------+-----+---------+--------+ Brachial   148          triphasic         +-----------+--------+-----+---------+--------+ Radial                  triphasic         +-----------+--------+-----+---------+--------+ Ulnar                   triphasic         +-----------+--------+-----+---------+--------+ Palmar Arch                      WNL      +-----------+--------+-----+---------+--------+  Left Doppler Findings: +-----------+--------+-----+---------+--------+ Site       PressureIndexDoppler  Comments +-----------+--------+-----+---------+--------+ Brachial   151          triphasic         +-----------+--------+-----+---------+--------+ Radial                  triphasic         +-----------+--------+-----+---------+--------+ Ulnar                   triphasic         +-----------+--------+-----+---------+--------+ Palmar Arch                      WNL      +-----------+--------+-----+---------+--------+  Summary: Right Carotid: The extracranial vessels were near-normal with only minimal wall                thickening or plaque. Left Carotid: The extracranial vessels were near-normal with only minimal wall               thickening or plaque. Vertebrals:  Bilateral vertebral  arteries demonstrate antegrade flow. Subclavians: Normal flow hemodynamics were seen in bilateral subclavian              arteries. Right Upper Extremity: Doppler waveforms remain within normal limits with right radial compression. Doppler waveforms remain within normal limits with right ulnar compression. Left Upper Extremity: Doppler waveforms remain within normal limits with left radial compression. Doppler waveforms remain within normal limits with left ulnar compression.  Electronically signed by Fabienne Bruns MD on 11/30/2018  at 4:17:13 PM.    Final    [place] Ekg Site Rite  Result Date: 12/10/2018 If Site Rite image not attached, placement could not be confirmed due to current cardiac rhythm.   Labs:  Basic Metabolic Panel: Recent Labs  Lab 12/18/18 0633 12/21/18 0757 12/24/18 0520  NA 133* 135 135  K 4.2 3.9 3.8  CL 101 99 102  CO2 24 23 23   GLUCOSE 122* 147* 115*  BUN 19 13 15   CREATININE 1.28* 1.46* 1.21  CALCIUM 8.2* 8.9 8.5*    CBC: Recent Labs  Lab 12/21/18 0757 12/24/18 0520  WBC 14.4* 9.0  HGB 12.4* 11.7*  HCT 38.1* 36.6*  MCV 95.3 94.8  PLT 354 276    CBG: Recent Labs  Lab 12/23/18 1728 12/24/18 0624 12/24/18 1138 12/24/18 1743 12/24/18 2106  GLUCAP 129* 109* 106* 119* 147*   Family history.  Father with CAD and myocardial infarction as well as hyperlipidemia and hypertension.  Denies any diabetes mellitus or colon cancer  Brief HPI:   Curtis [name] is a 68 y.o. right-handed male with history of tobacco abuse on no prescription medications.  He lives with his elderly parent and retired independent prior to admission.  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  Presented 11/24/2018 with worsening shortness of breath, tachycardia and tachypnea.  Oxygen saturations 60% on room air.  Reports of multiple syncopal episodes in route but patient never lost pulses.  He did receive epinephrine as well as Solu-Medrol.  Placed on BiPAP, high-sensitivity  troponin of 63, EKG showed narrow complex tachycardia.  BUN 16 creatinine 1.4, Covid negative.  Chest x-ray showed mild cardiomegaly and moderate pulmonary edema.  Echocardiogram with ejection fraction 25%.  Underwent cardiac catheterization per Dr. Verdis Prime showing severe multivessel CAD with 40% tubular left main.  60 to 70% proximal LAD tandem lesions.  Underwent CABG x3 as well as mitral valve replacement 11/30/2018 per Dr. Vickey Sages.  Postoperative cardiogenic shock with mediastinal bleeding returned emergently to the OR underwent removal of Impella left ventricular assistive device evacuation of left hemothorax 12/04/2018.  He was extubated 12/05/2018.  CT negative for pulmonary emboli.  Hospital course complicated by acute blood loss anemia leukocytosis 17,300 maintained on broad-spectrum antibiotics.  Subcutaneous Lovenox later added for DVT prophylaxis.  Maintained on aspirin after CABG.  Patient had been on milrinone transition to amiodarone.  Findings of mildly elevated hemoglobin A1c 5.8 sliding scale insulin was added.  Tolerating a regular diet.  Patient was admitted for a comprehensive rehab program.   Hospital Course: Jeramy Dimmick was admitted to rehab 12/11/2018 for inpatient therapies to consist of PT, ST and OT at least three hours five days a week. Past admission physiatrist, therapy team and rehab RN have worked together to provide customized collaborative inpatient rehab.  In regards to patient's CABG with mitral valve replacement 11/30/2018 surgical site healing nicely he would follow-up with cardiothoracic surgery.  Sternal precautions as advised.  Subcutaneous Lovenox for DVT prophylaxis as well as aspirin therapy no bleeding episodes.  Pain control with the use of tramadol.  Acute blood loss anemia stable latest hemoglobin 11.7.  Blood pressure controlled on Entresto as well as Aldactone and Demadex which was adjusted monitoring for any signs of fluid overload with lattest creatinine 1.21.   Cardiac rate remained controlled amiodarone as advised he would follow-up with cardiology services.  Prediabetes diet controlled full diabetic teaching   Blood pressures were monitored on TID basis and controlled  Diabetes  has been monitored with ac/hs CBG checks and SSI was use prn for tighter BS control.   He/ is continent of bowel and bladder.  He/ has made gains during rehab stay and is attending therapies  He/ will continue to receive follow up therapies   after discharge  Rehab course: During patient's stay in rehab weekly team conferences were held to monitor patient's progress, set goals and discuss barriers to discharge. At admission, patient required moderate assist ambulate 32 feet one-person hand-held assist, moderate assist supine to sit, supervision sit to supine.  Moderate assist upper body bathing max assist lower body bathing moderate assist upper body dressing moderate assist lower body dressing minimal assist toilet transfers  Physical exam.  Blood pressure 110/60 pulse 90 temperature 98.2 respirations 21 oxygen saturation 94% room air Constitutional.  Alert and oriented HEENT Head.  Normocephalic and atraumatic Eyes.  Pupils reactive to light no nystagmus without discharge Neck.  Supple nontender no JVD without thyromegaly Respiratory.  Effort normal no respiratory distress without rails GI.  Exhibits no distention nontender positive bowel sounds without rebound Musculoskeletal no edema or tenderness in extremities Neurological.  Alert and oriented provides his name and age some delay in processing follows full commands motor strength 4- out of 5 throughout. Skin.  Midline chest incision clean and dry  He/  has had improvement in activity tolerance, balance, postural control as well as ability to compensate for deficits. He/ has had improvement in functional use RUE/LUE  and RLE/LLE as well as improvement in awareness.  Supine to sit with supervision maintain sternal  precautions.  Perform Berg balance scale score and 39 out of 56 and explained significance of results to patient on need for supervision for his safety.  Discussed the need to maintain rolling walker for ambulation.  Able to stand without using handrail to stabilize independently.  Ambulating up to 200 feet.  Requires set up for upper body bathing moderate assist lower body bathing set up upper body dressing moderate assist lower body dressing full family teaching completed plan discharge to home       Disposition: Discharge to home   Diet: Carb modified diet  Special Instructions: No driving smoking or alcohol  Sternal precautions  Medications at discharge. 1.  Amiodarone 200 mg twice daily 2.  Aspirin 325 mg p.o. daily 3.  Voltaren gel 2 g 3 times a day to affected area 4.  Mucinex 600 mg p.o. twice daily 5.  Multivitamin daily 6.  Protonix 40 mg p.o. daily 7.  Crestor 40 mg p.o. daily 8.  Entresto 24-26 mg 1 tab p.o. twice daily 9.  Aldactone 25 mg p.o. daily 10.  Ultram 50  mg every 4 hours as needed moderate pain 11.Demadex 10 mg daily Discharge Instructions    Ambulatory referral to Physical Medicine Rehab   Complete by: As directed    Moderate complexity follow-up 1 to 2 weeks debilitation after CABG     Allergies as of 12/25/2018      Reactions   Penicillins Rash, Other (See Comments)   Tolerated cefepime 12/2018 Did it involve swelling of the face/tongue/throat, SOB, or low BP? Unknown Did it involve sudden or severe rash/hives, skin peeling, or any reaction on the inside of your mouth or nose? Yes Did you need to seek medical attention at a hospital or doctor's office? Unknown When did it last happen? childhood If all above answers are "NO", may proceed with cephalosporin use.      Medication List  STOP taking these medications   ceFEPIme 2 g in sodium chloride 0.9 % 100 mL   feeding supplement (PRO-STAT SUGAR FREE 64) Liqd   levalbuterol 0.63  MG/3ML nebulizer solution Commonly known as: XOPENEX   vancomycin 1,750 mg in sodium chloride 0.9 % 500 mL     TAKE these medications   amiodarone 200 MG tablet Commonly known as: PACERONE Take 1 tablet (200 mg total) by mouth 2 (two) times daily. For 5 days then take Amiodarone 200 mg daily thereafter   aspirin 325 MG EC tablet Take 1 tablet (325 mg total) by mouth daily.   diclofenac sodium 1 % Gel Commonly known as: VOLTAREN Apply 2 g topically 3 (three) times daily.   guaiFENesin 600 MG 12 hr tablet Commonly known as: MUCINEX Take 1 tablet (600 mg total) by mouth 2 (two) times daily.   multivitamin with minerals Tabs tablet Take 1 tablet by mouth daily.   pantoprazole 40 MG tablet Commonly known as: PROTONIX Take 1 tablet (40 mg total) by mouth daily.   rosuvastatin 40 MG tablet Commonly known as: CRESTOR Take 1 tablet (40 mg total) by mouth daily at 6 PM.   sacubitril-valsartan 24-26 MG Commonly known as: ENTRESTO Take 1 tablet by mouth 2 (two) times daily.   spironolactone 25 MG tablet Commonly known as: ALDACTONE Take 1 tablet (25 mg total) by mouth daily.   torsemide 10 MG tablet Commonly known as: DEMADEX Take 1 tablet (10 mg total) by mouth daily. What changed:   medication strength  how much to take   traMADol 50 MG tablet Commonly known as: ULTRAM Take 1-2 tablets (50-100 mg total) by mouth every 4 (four) hours as needed for moderate pain.      Follow-up Information    Ranelle Oyster, MD Follow up.   Specialty: Physical Medicine and Rehabilitation Why: As directed Contact information: [address] [place] [address] [phone]        Linden Dolin, MD Follow up.   Specialty: Cardiothoracic Surgery Why: Call for appointment Contact information: [address] [place] [address] [place] [address] [phone]        Lyn Records, MD Follow up.   Specialty: Cardiology Why: Call for appointment Contact  information: [address] [place] [address] [phone]        Bensimhon, Bevelyn Buckles, MD Follow up.   Specialty: Cardiology Contact information: [address] [place] [address] [phone]           Signed: Charlton Amor 12/25/2018, 5:32 AM

## 2018-12-23 NOTE — Progress Notes (Signed)
Physical Therapy Discharge Summary  Patient Details  Name: Curtis [name] MRN: [id number] Date of Birth: 02-Jul-1950  Today's Date: 12/24/2018 PT Individual Time: 0800-0850 PT Individual Time Calculation (min): 50 min   Pt in supine and agreeable to therapy, denies pain. Performed functional mobility as detailed below including bed mobility, transfer, gait both on firm and uneven surfaces, and stair negotiation. Additionally performed car transfer w/ supervision. Reinforced sternal "tube" precautions and no lifting >10 lbs verbally 2/2 pt's visual impairments but did provide pt w/ written handout to give to caregiver Randall Hiss) and his mother. Discussed home modifications to ask Randall Hiss and/or mother to make including putting kitchen and bathroom items on counter to avoid reaching and keeping all commonly-used items at waist or chest height. Discussed that pt would be unable to reach for items in grocery store and to ask Randall Hiss to help him with this as Randall Hiss already takes him grocery shopping. Verbal reminders of energy conservation strategies, which pt already does a good job of pacing himself. Written handout also has these strategies as well, will leave handout in room for pt to take home to caregivers. NuStep 2.5 min @ level 1 w/ BLEs x2 reps. Pt w/ increased fatigue, unable to go for full 5 min straight, however no significant increase in work of breathing or SOB. Requesting to return to supine and refusing to participate further. Returned to room total assist via w/c, ended session in supine w/ all needs in reach.   Patient has met 10 of 10 long term goals due to improved activity tolerance, improved balance, increased strength, ability to compensate for deficits and functional use of  right lower extremity and left lower extremity.  Patient to discharge at an ambulatory level Supervision.   Patient's care partner is independent to provide the necessary physical assistance at discharge. Pt's friend Randall Hiss) to  arrange for 24/7 supervision at home. Pt is able to direct his care appropriately and verbalizes understanding of all precautions including sternal precautions, fall risk 2/2 balance impairments, and importance of RW use at d/c.   Reasons goals not met: n/a  Recommendation:  Patient will benefit from ongoing skilled PT services in home health setting to continue to advance safe functional mobility, address ongoing impairments in functional balance, endurance, and global strength, and minimize fall risk.  Equipment: RW  Reasons for discharge: treatment goals met and discharge from hospital  Patient/family agrees with progress made and goals achieved: Yes  PT Discharge Precautions/Restrictions Precautions Precautions: Sternal;Fall Restrictions Weight Bearing Restrictions: No Vital Signs Therapy Vitals Temp: 98 F (36.7 C) Pulse Rate: 90 Resp: 18 BP: 124/71 Patient Position (if appropriate): Sitting Oxygen Therapy SpO2: 97 % O2 Device: Room Air Vision/Perception  Perception Perception: Within Functional Limits Praxis Praxis: Intact  Cognition Overall Cognitive Status: Within Functional Limits for tasks assessed Arousal/Alertness: Awake/alert Orientation Level: Oriented X4 Memory: Appears intact Awareness: Appears intact Problem Solving: Appears intact Safety/Judgment: Impaired Comments: impaired at baseline 2/2 vision impairments, pt very cautious however Sensation Sensation Light Touch: Appears Intact Coordination Gross Motor Movements are Fluid and Coordinated: Yes Motor  Motor Motor: Within Functional Limits Motor - Discharge Observations: generalized deconditioning  Mobility Bed Mobility Bed Mobility: Rolling Right;Rolling Left;Sit to Supine;Supine to Sit Rolling Right: Independent Rolling Left: Independent Supine to Sit: Independent Sit to Supine: Independent Transfers Transfers: Sit to Stand;Stand to Sit;Stand Pivot Transfers Sit to Stand:  Supervision/Verbal cueing Stand to Sit: Supervision/Verbal cueing Stand Pivot Transfers: Supervision/Verbal cueing Transfer (Assistive device): None Locomotion  Gait Ambulation:  Yes Gait Assistance: Supervision/Verbal cueing Gait Distance (Feet): 150 Feet Assistive device: None;Rolling walker(supervision w/ or w/o AD, pt reports decreased SOB w/ RW) Gait Gait: Yes Gait Pattern: Impaired Gait Pattern: Shuffle;Wide base of support;Poor foot clearance - left;Poor foot clearance - right Gait velocity: decreased Stairs / Additional Locomotion Stairs: Yes Stairs Assistance: Supervision/Verbal cueing Stair Management Technique: Two rails Number of Stairs: 12 Height of Stairs: 6 Wheelchair Mobility Wheelchair Mobility: No  Trunk/Postural Assessment  Thoracic Assessment Thoracic Assessment: Within Functional Limits Lumbar Assessment Lumbar Assessment: Within Functional Limits Postural Control Postural Control: Within Functional Limits  Balance Balance Balance Assessed: Yes Static Sitting Balance Static Sitting - Level of Assistance: 7: Independent Dynamic Sitting Balance Dynamic Sitting - Level of Assistance: 7: Independent Static Standing Balance Static Standing - Level of Assistance: 5: Stand by assistance Dynamic Standing Balance Dynamic Standing - Level of Assistance: 5: Stand by assistance Extremity Assessment  RLE Assessment RLE Assessment: Within Functional Limits LLE Assessment LLE Assessment: Within Functional Limits    Curtis [name] 12/24/2018, 8:55 AM

## 2018-12-23 NOTE — Progress Notes (Signed)
Physical Therapy Session Note  Patient Details  Name: Curtis [name] MRN: [id number] Date of Birth: [birth year]  Today's Date: 12/23/2018 PT Individual Time: 7829-5621 PT Individual Time Calculation (min): 40 min   Short Term Goals: Week 2:  PT Short Term Goal 1 (Week 2): =LTGs due to ELOS  Skilled Therapeutic Interventions/Progress Updates:    Pt received in bed & agreeable to tx, performing bed mobility with HOB slightly elevated but no bed rails & supervision with cuing to not pull with bed rails. Assisted pt with donning shoes EOB. Assisted pt with donning new brief as current one was falling off; pt stands with CGA for balance. Transported pt to dayroom via w/c as pt declined walking. In dayroom, pt ambulates 100 ft without AD & CGA with slight lateral sway noted & wide BOS, with decreased heel strike RLE. After task pt denies symptoms, BP checked = 120/65 mmHg (RUE sitting), HR = 77 bpm. Vitals then checked in standing: BP = 96/64 mmHg (RUE), HR = 97 bpm & pt denies symptoms. Pt performed 3" step taps without BUE support with min assist & ease so progressed to tapping cones without BUE support & min/mod assist for balance with task focusing on weight shifting & dynamic balance. Pt utilized nu-step on level 3 x 6 minutes with BLE only with task focusing on BLE strengthening & endurance training; pt requires multiple rest breaks 2/2 fatigue & task ultimately ended 2/2 pt's overall & B knee fatigue. Back in room pt ambulates w/c>bed without AD & CGA. Pt transfers sit>supine with supervision, HOB slightly elevated. Pt left in bed with alarm set & all needs in reach.  Therapy Documentation Precautions:  Precautions Precautions: Fall, Sternal Restrictions Weight Bearing Restrictions: Yes(sternal precaution) Other Position/Activity Restrictions: sternal precautions   Pain: Pt denies c/o pain.    Therapy/Group: [place] 12/23/2018, 5:27 PM

## 2018-12-23 NOTE — Progress Notes (Signed)
Social Work Patient ID: Curtis [name], male   DOB: 10-Jun-1950, 68 y.o.   MRN: [id number]  Have reviewed team conference with pt and friend, Randall Hiss.  Both aware that we continue to plan for 10/23 d/c and supervision goals.  Stressed to both that 24/7 supervision is the recommendation.  Friend still working on getting this arranged and notes pt may resist to 24/7 due to expense.  Will also work to secure a primary medical care home.  Friend asking if [facility] referral can be made - will contact them to see.  Continue to follow.  Unknown Schleyer, LCSW

## 2018-12-24 ENCOUNTER — Inpatient Hospital Stay (HOSPITAL_COMMUNITY): Payer: Medicare Other

## 2018-12-24 ENCOUNTER — Inpatient Hospital Stay (HOSPITAL_COMMUNITY): Payer: Medicare Other | Admitting: Physical Therapy

## 2018-12-24 ENCOUNTER — Inpatient Hospital Stay (HOSPITAL_COMMUNITY): Payer: Medicare Other | Admitting: Occupational Therapy

## 2018-12-24 LAB — CBC
HCT: 36.6 % — ABNORMAL LOW (ref 39.0–52.0)
Hemoglobin: 11.7 g/dL — ABNORMAL LOW (ref 13.0–17.0)
MCH: 30.3 pg (ref 26.0–34.0)
MCHC: 32 g/dL (ref 30.0–36.0)
MCV: 94.8 fL (ref 80.0–100.0)
Platelets: 276 10*3/uL (ref 150–400)
RBC: 3.86 MIL/uL — ABNORMAL LOW (ref 4.22–5.81)
RDW: 17.4 % — ABNORMAL HIGH (ref 11.5–15.5)
WBC: 9 10*3/uL (ref 4.0–10.5)
nRBC: 0 % (ref 0.0–0.2)

## 2018-12-24 LAB — BASIC METABOLIC PANEL
Anion gap: 10 (ref 5–15)
BUN: 15 mg/dL (ref 8–23)
CO2: 23 mmol/L (ref 22–32)
Calcium: 8.5 mg/dL — ABNORMAL LOW (ref 8.9–10.3)
Chloride: 102 mmol/L (ref 98–111)
Creatinine, Ser: 1.21 mg/dL (ref 0.61–1.24)
GFR calc Af Amer: >60 mL/min (ref >60)
GFR calc non Af Amer: >60 mL/min (ref >60)
Glucose, Bld: 115 mg/dL — ABNORMAL HIGH (ref 70–99)
Potassium: 3.8 mmol/L (ref 3.5–5.1)
Sodium: 135 mmol/L (ref 135–145)

## 2018-12-24 LAB — GLUCOSE, CAPILLARY
Glucose-Capillary: 109 mg/dL — ABNORMAL HIGH (ref 70–99)
Glucose-Capillary: 106 mg/dL — ABNORMAL HIGH (ref 70–99)
Glucose-Capillary: 119 mg/dL — ABNORMAL HIGH (ref 70–99)
Glucose-Capillary: 147 mg/dL — ABNORMAL HIGH (ref 70–99)

## 2018-12-24 MED ORDER — TORSEMIDE 10 MG PO TABS
10.0000 mg | ORAL_TABLET | Freq: Every day | ORAL | Status: DC
  Administered 2018-12-24 – 2018-12-25 (×2): 10 mg via ORAL
  Filled 2018-12-24 (×2): qty 1

## 2018-12-24 MED ORDER — TORSEMIDE 10 MG PO TABS: 10.0000 mg | ORAL_TABLET | Freq: Every day | ORAL | 0 refills | Status: DC

## 2018-12-24 NOTE — Progress Notes (Signed)
Occupational Therapy Discharge Summary  Patient Details  Name: Curtis [name] MRN: [id number] Date of Birth: 1950/08/20  Patient has met 8 of 8 long term goals due to improved activity tolerance, improved balance, postural control and ability to compensate for deficits.  Patient to discharge at overall Supervision level.  Patient's care partner is independent to provide the necessary physical assistance at discharge for higher level iADL tasks.    Reasons goals not met: n/a  Recommendation:  Patient will benefit from ongoing skilled OT services in home health setting to continue to advance functional skills in the area of BADL.  Equipment: RW and 3-in-1 BSC  Reasons for discharge: treatment goals met and discharge from hospital  Patient/family agrees with progress made and goals achieved: Yes  OT Discharge Precautions/Restrictions  Precautions Precautions: Sternal;Fall Restrictions Weight Bearing Restrictions: Yes(sternal precaution) ADL ADL Equipment Provided: Long-handled shoe horn Eating: Set up Grooming: Supervision/safety Where Assessed-Grooming: Sitting at sink Upper Body Bathing: Supervision/safety Where Assessed-Upper Body Bathing: Sitting at sink Lower Body Bathing: Supervision/safety Where Assessed-Lower Body Bathing: Sitting at sink, Standing at sink Upper Body Dressing: Supervision/safety Where Assessed-Upper Body Dressing: Wheelchair Lower Body Dressing: Supervision/safety Where Assessed-Lower Body Dressing: Wheelchair Toileting: Supervision/safety Where Assessed-Toileting: Glass blower/designer: Close supervision Toilet Transfer Method: Arts development officer: Grab bars Vision Baseline Vision/History: Cataracts;Legally blind Patient Visual Report: Blurring of vision(blurring of vision at baseline from cataracts, but feels it has gotten wose since being in the hospital) Perception  Perception: Within Functional Limits Praxis Praxis:  Intact Cognition Overall Cognitive Status: Within Functional Limits for tasks assessed Arousal/Alertness: Awake/alert Orientation Level: Oriented to person;Oriented to place;Oriented to situation;Disoriented to time Memory: Appears intact Awareness: Appears intact Problem Solving: Appears intact Safety/Judgment: Impaired Comments: impaired at baseline 2/2 vision impairments, pt very cautious however Sensation Sensation Light Touch: Appears Intact Coordination Gross Motor Movements are Fluid and Coordinated: Yes Fine Motor Movements are Fluid and Coordinated: Yes Motor  Motor Motor: Within Functional Limits Motor - Discharge Observations: generalized deconditioning, improved since eval Mobility  Bed Mobility Bed Mobility: Rolling Right;Rolling Left;Sit to Supine;Supine to Sit Rolling Right: Independent Rolling Left: Independent Supine to Sit: Independent Sit to Supine: Independent Transfers Sit to Stand: Supervision/Verbal cueing Stand to Sit: Supervision/Verbal cueing  Trunk/Postural Assessment  Thoracic Assessment Thoracic Assessment: Within Functional Limits Lumbar Assessment Lumbar Assessment: Within Functional Limits Postural Control Postural Control: Within Functional Limits  Balance Balance Balance Assessed: Yes Static Sitting Balance Static Sitting - Level of Assistance: 7: Independent Dynamic Sitting Balance Dynamic Sitting - Level of Assistance: 7: Independent Static Standing Balance Static Standing - Level of Assistance: 5: Stand by assistance Dynamic Standing Balance Dynamic Standing - Level of Assistance: 5: Stand by assistance Extremity/Trunk Assessment RUE Assessment General Strength Comments: distally WFL, Shoulder not assessed due to sternal precautions LUE Assessment General Strength Comments: distally WFL, Shoulder not assessed due to sternal precautions   Curtis [name] 12/24/2018, 3:45 PM

## 2018-12-24 NOTE — Progress Notes (Signed)
Pt refused to do standing orthostatic vitals this am. Pt was educated on importance of doing them, and pt continued to refuse. Pt stated " it doesn't really matter, I am leaving this place on Friday regardless of what yall say." Pt stated he wanted to be left alone. Bed in lowest position, call light in reach.

## 2018-12-24 NOTE — Plan of Care (Signed)
  Problem: RH Balance Goal: LTG: Patient will maintain dynamic sitting balance (OT) Description: LTG:  Patient will maintain dynamic sitting balance with assistance during activities of daily living (OT) Outcome: Completed/Met Goal: LTG Patient will maintain dynamic standing with ADLs (OT) Description: LTG:  Patient will maintain dynamic standing balance with assist during activities of daily living (OT)  Outcome: Completed/Met   Problem: Sit to Stand Goal: LTG:  Patient will perform sit to stand in prep for activites of daily living with assistance level (OT) Description: LTG:  Patient will perform sit to stand in prep for activites of daily living with assistance level (OT) Outcome: Completed/Met   Problem: RH Bathing Goal: LTG Patient will bathe all body parts with assist levels (OT) Description: LTG: Patient will bathe all body parts with assist levels (OT) Outcome: Completed/Met   Problem: RH Dressing Goal: LTG Patient will perform lower body dressing w/assist (OT) Description: LTG: Patient will perform lower body dressing with assist, with/without cues in positioning using equipment (OT) Outcome: Completed/Met   Problem: RH Toileting Goal: LTG Patient will perform toileting task (3/3 steps) with assistance level (OT) Description: LTG: Patient will perform toileting task (3/3 steps) with assistance level (OT)  Outcome: Completed/Met   Problem: RH Toilet Transfers Goal: LTG Patient will perform toilet transfers w/assist (OT) Description: LTG: Patient will perform toilet transfers with assist, with/without cues using equipment (OT) Outcome: Completed/Met   Problem: RH Tub/Shower Transfers Goal: LTG Patient will perform tub/shower transfers w/assist (OT) Description: LTG: Patient will perform tub/shower transfers with assist, with/without cues using equipment (OT) Outcome: Completed/Met

## 2018-12-24 NOTE — Discharge Instructions (Signed)
Inpatient Rehab Discharge Instructions  Aydin Hink Discharge date and time: No discharge date for patient encounter.   Activities/Precautions/ Functional Status: Activity: Sternal precautions Diet: diabetic diet Wound Care: keep wound clean and dry Functional status:  ___ No restrictions     ___ Walk up steps independently ___ 24/7 supervision/assistance   ___ Walk up steps with assistance ___ Intermittent supervision/assistance  ___ Bathe/dress independently ___ Walk with walker     __x_ Bathe/dress with assistance ___ Walk Independently    ___ Shower independently ___ Walk with assistance    ___ Shower with assistance ___ No alcohol     ___ Return to work/school ________    COMMUNITY REFERRALS UPON DISCHARGE:    Home Health:   PT     OT    RN      SW                    Agency:  [place]    Phone: [phone]   Medical Equipment/Items Ordered:  Gilford Rile, commode                                                      Agency/Supplier:  [facility] @ [phone]     Special Instructions: No driving smoking or alcohol   My questions have been answered and I understand these instructions. I will adhere to these goals and the provided educational materials after my discharge from the hospital.  Patient/Caregiver Signature _______________________________ Date __________  Clinician Signature _______________________________________ Date __________  Please bring this form and your medication list with you to all your follow-up doctor's appointments.

## 2018-12-24 NOTE — Progress Notes (Signed)
[facility] PHYSICAL MEDICINE & REHABILITATION PROGRESS NOTE   Subjective/Complaints: Up with therapy early. Fatigued afterwards. No new complaints  ROS: Patient denies fever, rash, sore throat, blurred vision, nausea, vomiting, diarrhea, cough,   joint or back pain, headache, or mood change.    Objective:   No results found. Recent Labs    12/24/18 0520  WBC 9.0  HGB 11.7*  HCT 36.6*  PLT 276   Recent Labs    12/24/18 0520  NA 135  K 3.8  CL 102  CO2 23  GLUCOSE 115*  BUN 15  CREATININE 1.21  CALCIUM 8.5*    Intake/Output Summary (Last 24 hours) at 12/24/2018 1052 Last data filed at 12/24/2018 0900 Gross per 24 hour  Intake 680 ml  Output 400 ml  Net 280 ml     Physical Exam: Vital Signs Blood pressure 124/71, pulse 90, temperature 98 F (36.7 C), resp. rate 18, height 5\' 8"  (1.727 m), weight 79.8 kg, SpO2 97 %. Constitutional: No distress . Vital signs reviewed. HEENT: EOMI, oral membranes moist Neck: supple Cardiovascular: RRR without murmur. No JVD    Respiratory: CTA Bilaterally without wheezes or rales. Normal effort    GI: BS +, non-tender, non-distended  Skin: Sternotomy site C/D/I , abdominal sutures out Psych: Normal mood.  Normal behavior. Musc: No pain, full ROM Neurologic: alert Motor: Bilateral upper extremities 5/5 proximal distal, stable Bilateral lower extremities: Hip flexion, knee extension 4 --4/5, ankle dorsiflexion 4/5 --no changes  Assessment/Plan: 1. Functional deficits secondary to Debility following CABG  which require 3+ hours per day of interdisciplinary therapy in a comprehensive inpatient rehab setting.  Physiatrist is providing close team supervision and 24 hour management of active medical problems listed below.  Physiatrist and rehab team continue to assess barriers to discharge/monitor patient progress toward functional and medical goals  Care Tool:  Bathing  Bathing activity did not occur: Refused Body parts  bathed by patient: Right arm, Left arm, Chest, Abdomen, Front perineal area, Buttocks, Right upper leg, Left upper leg, Face, Right lower leg, Left lower leg   Body parts bathed by helper: Right lower leg, Left lower leg     Bathing assist Assist Level: Contact Guard/Touching assist     Upper Body Dressing/Undressing Upper body dressing   What is the patient wearing?: Pull over shirt    Upper body assist Assist Level: Set up assist    Lower Body Dressing/Undressing Lower body dressing      What is the patient wearing?: Pants     Lower body assist Assist for lower body dressing: Contact Guard/Touching assist     Toileting Toileting    Toileting assist Assist for toileting: Minimal Assistance - Patient > 75%     Transfers Chair/bed transfer  Transfers assist     Chair/bed transfer assist level: Supervision/Verbal cueing Chair/bed transfer assistive device: Programmer, multimedia   Ambulation assist      Assist level: Supervision/Verbal cueing Assistive device: No Device Max distance: 150'   Walk 10 feet activity   Assist     Assist level: Supervision/Verbal cueing Assistive device: No Device   Walk 50 feet activity   Assist    Assist level: Supervision/Verbal cueing Assistive device: No Device    Walk 150 feet activity   Assist Walk 150 feet activity did not occur: Safety/medical concerns  Assist level: Supervision/Verbal cueing Assistive device: No Device    Walk 10 feet on uneven surface  activity   Assist  Assist level: Contact Guard/Touching assist Assistive device: Other (comment)(no device)   Wheelchair     Assist Will patient use wheelchair at discharge?: No Type of Wheelchair: Manual Wheelchair activity did not occur: N/A  Wheelchair assist level: Supervision/Verbal cueing Max wheelchair distance: 168ft with bilateral LE's    Wheelchair 50 feet with 2 turns activity    Assist    Wheelchair 50  feet with 2 turns activity did not occur: N/A   Assist Level: Supervision/Verbal cueing   Wheelchair 150 feet activity     Assist  Wheelchair 150 feet activity did not occur: N/A       Blood pressure 124/71, pulse 90, temperature 98 F (36.7 C), resp. rate 18, height 5\' 8"  (1.727 m), weight 79.8 kg, SpO2 97 %.    Medical Problem List and Plan:  1. Debility secondary to CAD with non-STEMI status post CABG with MVR 11/30/2018 with postoperative mediastinal bleeding/cardiogenic shock/acute respiratory failure with removal of Impella left ventricular assistive device evacuation of left hemothorax 12/04/2018. Sternal precautions   Continue CIR  ELOS 10/23  -Patient to see Rehab MD/provider in the office for transitional care encounter in 1-2 weeks.   2. Antithrombotics:  -DVT/anticoagulation: Lovenox. Monitor for any bleeding episodes  -antiplatelet therapy: Aspirin 325 mg daily  3. Pain Management: Tramadol as needed   -xrays of left ankle demonstrate mild OA, osteophytes   -continue voltaren gel----much improved 4. Mood: Provide emotional support  -antipsychotic agents: N/A  5. Neuropsych: This patient is? Fully capable of making decisions on his own behalf.  6. Skin/Wound Care: Routine skin checks  7. Fluids/Electrolytes/Nutrition: Routine in and outs.  Discussed importance of adequate PO intake,especially protein. Intake has been sporadic (0-100%)  -Cr 1.21 10/22      8. Acute blood loss anemia. hgb stable at 11.7 9. Hypertension. Entresto 24-26 mg twice daily, Aldactone 25 mg daily,      Vitals:   12/23/18 1932 12/24/18 0621  BP: 121/65 124/71  Pulse: 91 90  Resp: 18 18  Temp: 97.7 F (36.5 C) 98 F (36.7 C)  SpO2: 98% 97%   Controlled on 10/22 10. PAF. Amiodarone 200 mg daily. Cardiac rate controlled. Monitor with increased physical exertion.  11. Leukocytosis. recurrent.   WBCs 9 10/22  Afebrile  Urine culture blood cultures no growth-final  - Empiric  vancomycin and cefepime discontinued.   -continue to monitor serially.   -afebrile, wounds clean    12. Prediabetes. Levemir 5 units stopped  -should be able to manage just with diet control CBG (last 3)  Recent Labs    12/23/18 1158 12/23/18 1728 12/24/18 0624  GLUCAP 139* 129* 109*   13. Combined CHF   EF of 25-30%   Daily weights   Filed Weights   12/22/18 0527 12/23/18 0500 12/24/18 0500  Weight: 82 kg 78.8 kg 79.8 kg   Stable on 10/22  -may need to be sl "dry" to maintain ideal weight  -will resume demadex and send home on demadex 10mg  daily 14.  Hyponatremia  Sodium 135 10/22    LOS: 13 days A FACE TO FACE EVALUATION WAS PERFORMED  12/24/2018, 10:52 AM

## 2018-12-24 NOTE — Progress Notes (Signed)
Occupational Therapy Session Note  Patient Details  Name: Curtis [name] MRN: [id number] Date of Birth: [birth year]  Today's Date: 12/24/2018 OT Individual Time: 1500-1530 OT Individual Time Calculation (min): 30 min    Short Term Goals: Week 1:  OT Short Term Goal 1 (Week 1): Pt will be able to stand up following sternal precautions with CGA. OT Short Term Goal 1 - Progress (Week 1): Met OT Short Term Goal 2 (Week 1): Pt will be able to transfer to toilet following sternal precautions with CGA. OT Short Term Goal 2 - Progress (Week 1): Met OT Short Term Goal 3 (Week 1): Pt will bathe LB with CGA. OT Short Term Goal 3 - Progress (Week 1): Met OT Short Term Goal 4 (Week 1): Pt will dress LB with CGA. OT Short Term Goal 4 - Progress (Week 1): Met Week 2:  OT Short Term Goal 1 (Week 2): STG=LTG 2/2 ELOS  Skilled Therapeutic Interventions/Progress Updates:    1:1 Pt reporting wetting himself in the bed. Pt came to EOB with extra time with supervision. Pt performed sit to stand to perform pericare and doff brief with setup and supervision. Pt continues to demonstrate decr endurance and may need A to setup and clean up self after an accident at him. Pt able to thread brief but requested A to thread new pants due to fatigue. Pt   Therapy Documentation Precautions:  Precautions Precautions: Sternal, Fall Restrictions Weight Bearing Restrictions: Yes(sternal precaution) Other Position/Activity Restrictions: sternal precautions General: General OT Amount of Missed Time: 15 Minutes Vital Signs: Therapy Vitals Temp: 97.7 F (36.5 C) Temp Source: Oral Pulse Rate: 85 Resp: 18 BP: 113/66 Patient Position (if appropriate): Lying Oxygen Therapy SpO2: 97 % O2 Device: Room Air Pain:  no c/o pain in session   Therapy/Group: Individual Therapy  Willeen Cass [facility] 12/24/2018, 3:54 PM

## 2018-12-24 NOTE — Progress Notes (Signed)
Occupational Therapy Session Note  Patient Details  Name: Curtis [name] MRN: [id number] Date of Birth: Jul 06, 1950  Today's Date: 12/24/2018 OT Individual Time: 1000-1100 OT Individual Time Calculation (min): 60 min   Short Term Goals: Week 2:  OT Short Term Goal 1 (Week 2): STG=LTG 2/2 ELOS  Skilled Therapeutic Interventions/Progress Updates:    Pt greeted semi-reclined in bed and agreeable to OT treatment session. Pt agreeable to orthostatic vitals as taken below. Pt was orthostatic with knee high TEDs on, but was asymptomatic. Details noted below. Discussed home dc plan. Home set-up, DME needs, BADL modifications and sternal precautions within BADL tasks. Pt understands that he will be limited to sponge baths for now and plan for [facility] to keep working on tub shower transfers when he is appropriate. Also discussed energy conservation and how pt would be unable to tolerate standing in the shower right now as he fatigues after a few minutes in standing. Pt verbalized understanding. Pt reported need to go to the bathroom. Sit<>stand with RW and close supervision + verbal cues for hand placement to maintain sternal precautions. Pt ambulated into bathroom with close supervision. Pt stood at commode and voided bladder in standing. Educated on RW positioning around toilet at home. Pt voided bladder successfully in standing, then returned to the sink to was hands. Verbal cues for RW positioning at the sink. Pt reported max fatigue, but said he was not dizzy. Practiced donning/doffing TED hose using friction reducing device. OT also reviewed low vision strategies for feeling to top and bottom of TED hose. Pt declined to stay OOB in recliner and returned to bed with supervision. Pt left semi-reclined in bed with bed alarm on and needs met.     12/24/18 1057  Orthostatic Lying   BP- Lying 122/65  Pulse- Lying 87  Orthostatic Sitting  BP- Sitting 108/64  Pulse- Sitting 89  Orthostatic Standing at 0 minutes   BP- Standing at 0 minutes (!) 79/36  Pulse- Standing at 0 minutes 72      Therapy Documentation Precautions:  Precautions Precautions: Sternal, Fall Restrictions Weight Bearing Restrictions: Yes(sternal precaution) Other Position/Activity Restrictions: sternal precautions Pain: Denies pain Therapy/Group: Individual Therapy  Valma Cava 12/24/2018, 10:58 AM

## 2018-12-24 NOTE — Plan of Care (Signed)
  Problem: Consults Goal: RH GENERAL PATIENT EDUCATION Description: See Patient Education module for education specifics. Outcome: Progressing Goal: Skin Care Protocol Initiated - if Braden Score 18 or less Description: If consults are not indicated, leave blank or document N/A Outcome: Progressing Goal: Nutrition Consult-if indicated Outcome: Progressing Goal: Diabetes Guidelines if Diabetic/Glucose > 140 Description: If diabetic or lab glucose is > 140 mg/dl - Initiate Diabetes/Hyperglycemia Guidelines & Document Interventions  Outcome: Progressing   Problem: RH BLADDER ELIMINATION Goal: RH STG MANAGE BLADDER WITH ASSISTANCE Description: STG Manage Bladder With min Assistance Outcome: Progressing   Problem: RH SKIN INTEGRITY Goal: RH STG SKIN FREE OF INFECTION/BREAKDOWN Description: No skin breakdown or wound infection while in rehab with min assist Outcome: Progressing Goal: RH STG MAINTAIN SKIN INTEGRITY WITH ASSISTANCE Description: STG Maintain Skin Integrity With min Assistance. Outcome: Progressing Goal: RH STG ABLE TO PERFORM INCISION/WOUND CARE W/ASSISTANCE Description: STG Able To Perform Incision/Wound Care With min  assistance. Outcome: Progressing   Problem: RH SAFETY Goal: RH STG ADHERE TO SAFETY PRECAUTIONS W/ASSISTANCE/DEVICE Description: STG Adhere to Safety Precautions With min Assistance/Device. Outcome: Progressing Goal: RH STG DECREASED RISK OF FALL WITH ASSISTANCE Description: STG Decreased Risk of Fall With min assistance. Outcome: Progressing   Problem: RH PAIN MANAGEMENT Goal: RH STG PAIN MANAGED AT OR BELOW PT'S PAIN GOAL Description: Pain <3 Outcome: Progressing   Problem: RH KNOWLEDGE DEFICIT GENERAL Goal: RH STG INCREASE KNOWLEDGE OF SELF CARE AFTER HOSPITALIZATION Description: Information on self care after hospitalization will increase/improve with min assist Outcome: Progressing   

## 2018-12-24 NOTE — Progress Notes (Signed)
Occupational Therapy Session Note  Patient Details  Name: Curtis [name] MRN: [id number] Date of Birth: 1950/07/28  Today's Date: 12/24/2018 OT Individual Time: 1500-1530 OT Individual Time Calculation (min): 30 min    Short Term Goals: Week 2:  OT Short Term Goal 1 (Week 2): STG=LTG 2/2 ELOS      Skilled Therapeutic Interventions/Progress Updates:  Pt received in supine in bed, agreeable to tx with OT. Pt transfers EOB with (S) and dons shoes with shoe horn and (S). Pt completes functional ambulation with (S) to therapy gym. In therapy gym, pt completes functional task with reacher and RW and close (S). Activity focused on pt ability to maintain sternal precautions from standing position and RW management. Pt completes 10 reps sit <> stand from EOM to address endurance and functional transfers with precautions. Reporting fatigue after 10 reps and asking to sit before walking back to room. Exited session pt in bed, bed alarm set and all needs met.      Therapy Documentation Precautions:  Precautions Precautions: Sternal, Fall Restrictions Weight Bearing Restrictions: Yes(sternal precaution) Other Position/Activity Restrictions: sternal precautions       Therapy/Group: Individual Therapy  [name] 12/24/2018, 4:14 PM

## 2018-12-25 LAB — GLUCOSE, CAPILLARY
Glucose-Capillary: 131 mg/dL — ABNORMAL HIGH (ref 70–99)
Glucose-Capillary: 118 mg/dL — ABNORMAL HIGH (ref 70–99)

## 2018-12-25 NOTE — Progress Notes (Signed)
Patient discharged to home. Discharge instructions given. Patient taken via wheelchair with belongings.

## 2018-12-25 NOTE — Progress Notes (Signed)
Social Work Discharge Note   The overall goal for the admission was met for:   Discharge location: Yes - home with elderly mother.  Friends arranging 24/7 assistance.  Length of Stay: Yes - 14 days  Discharge activity level: Yes - supervision  Home/community participation: Yes  Services provided included: MD, RD, PT, OT, SLP, RN, TR, Pharmacy, Neuropsych and SW  Financial Services: Medicare  Follow-up services arranged: Home Health: RN, PT, OT and SW via [facility], DME: walker, 3n1 commode via [employer] and Patient/Family has no preference for HH/DME agencies  Comments (or additional information):    Contact info:  Pt @ (H) [phone] or friend/ Neysa Bonito @ [phone]  Patient/Family verbalized understanding of follow-up arrangements: Yes  Individual responsible for coordination of the follow-up plan: pt  Confirmed correct DME delivered: Savanah Bayles 12/25/2018    Ajooni Karam

## 2018-12-25 NOTE — Progress Notes (Signed)
Pt resting in bed at this time. No complaints during shift assessment. Call light in reach, bed in lowest position.

## 2018-12-25 NOTE — Progress Notes (Signed)
[facility] PHYSICAL MEDICINE & REHABILITATION PROGRESS NOTE   Subjective/Complaints: No problems overnight. Anxious to get home. Denies pain  ROS: Patient denies fever, rash, sore throat, blurred vision, nausea, vomiting, diarrhea, cough, shortness of breath or chest pain, joint or back pain, headache, or mood change.   Objective:   No results found. Recent Labs    12/24/18 0520  WBC 9.0  HGB 11.7*  HCT 36.6*  PLT 276   Recent Labs    12/24/18 0520  NA 135  K 3.8  CL 102  CO2 23  GLUCOSE 115*  BUN 15  CREATININE 1.21  CALCIUM 8.5*    Intake/Output Summary (Last 24 hours) at 12/25/2018 1115 Last data filed at 12/25/2018 0800 Gross per 24 hour  Intake 600 ml  Output 200 ml  Net 400 ml     Physical Exam: Vital Signs Blood pressure 122/69, pulse 84, temperature 97.8 F (36.6 C), temperature source Oral, resp. rate 19, height 5\' 8"  (1.727 m), weight 77.2 kg, SpO2 98 %. Constitutional: No distress . Vital signs reviewed. HEENT: EOMI, oral membranes moist Neck: supple Cardiovascular: RRR without murmur. No JVD    Respiratory: CTA Bilaterally without wheezes or rales. Normal effort    GI: BS +, non-tender, non-distended  Skin: Sternotomy site C/D/I , abdominal sutures out Psych: Normal mood.  Normal behavior. Musc: No pain, full ROM Neurologic: alert Motor: Bilateral upper extremities 5/5 proximal distal, stable Bilateral lower extremities: Hip flexion, knee extension  4/5, ankle dorsiflexion 4/5--stable to improved   Assessment/Plan: 1. Functional deficits secondary to Debility following CABG  which require 3+ hours per day of interdisciplinary therapy in a comprehensive inpatient rehab setting.  Physiatrist is providing close team supervision and 24 hour management of active medical problems listed below.  Physiatrist and rehab team continue to assess barriers to discharge/monitor patient progress toward functional and medical goals  Care Tool:  Bathing  Bathing activity did not occur: Refused Body parts bathed by patient: Right arm, Left arm, Chest, Abdomen, Front perineal area, Buttocks, Right upper leg, Left upper leg, Face, Right lower leg, Left lower leg   Body parts bathed by helper: Right lower leg, Left lower leg     Bathing assist Assist Level: Supervision/Verbal cueing     Upper Body Dressing/Undressing Upper body dressing   What is the patient wearing?: Pull over shirt    Upper body assist Assist Level: Supervision/Verbal cueing    Lower Body Dressing/Undressing Lower body dressing      What is the patient wearing?: Pants     Lower body assist Assist for lower body dressing: Supervision/Verbal cueing     Toileting Toileting    Toileting assist Assist for toileting: Supervision/Verbal cueing     Transfers Chair/bed transfer  Transfers assist     Chair/bed transfer assist level: Supervision/Verbal cueing Chair/bed transfer assistive device:   Ambulation assist      Assist level: Supervision/Verbal cueing Assistive device: No Device Max distance: 150'   Walk 10 feet activity   Assist     Assist level: Supervision/Verbal cueing Assistive device: No Device   Walk 50 feet activity   Assist    Assist level: Supervision/Verbal cueing Assistive device: No Device    Walk 150 feet activity   Assist Walk 150 feet activity did not occur: Safety/medical concerns  Assist level: Supervision/Verbal cueing Assistive device: No Device    Walk 10 feet on uneven surface  activity   Assist  Assist level: Contact Guard/Touching assist Assistive device: Other (comment)(no device)   Wheelchair     Assist Will patient use wheelchair at discharge?: No Type of Wheelchair: Manual Wheelchair activity did not occur: N/A  Wheelchair assist level: Supervision/Verbal cueing Max wheelchair distance: 171ft with bilateral LE's    Wheelchair 50 feet with 2  turns activity    Assist    Wheelchair 50 feet with 2 turns activity did not occur: N/A   Assist Level: Supervision/Verbal cueing   Wheelchair 150 feet activity     Assist  Wheelchair 150 feet activity did not occur: N/A       Blood pressure 122/69, pulse 84, temperature 97.8 F (36.6 C), temperature source Oral, resp. rate 19, height 5\' 8"  (1.727 m), weight 77.2 kg, SpO2 98 %.    Medical Problem List and Plan:  1. Debility secondary to CAD with non-STEMI status post CABG with MVR 11/30/2018 with postoperative mediastinal bleeding/cardiogenic shock/acute respiratory failure with removal of Impella left ventricular assistive device evacuation of left hemothorax 12/04/2018. Sternal precautions   Continue CIR  Dc home today  -Patient to see Rehab MD/provider in the office for transitional care encounter in 1-2 weeks.   2. Antithrombotics:  -DVT/anticoagulation: Lovenox. Monitor for any bleeding episodes  -antiplatelet therapy: Aspirin 325 mg daily  3. Pain Management: Tramadol as needed   -xrays of left ankle demonstrate mild OA, osteophytes   -continue voltaren gel--can continue prn 4. Mood: Provide emotional support  -antipsychotic agents: N/A  5. Neuropsych: This patient is? Fully capable of making decisions on his own behalf.  6. Skin/Wound Care: Routine skin checks  7. Fluids/Electrolytes/Nutrition: Routine in and outs.  Have reiterated importance of a balanced diet  -Cr 1.21 10/22      8. Acute blood loss anemia. hgb stable at 11.7 9. Hypertension. Entresto 24-26 mg twice daily, Aldactone 25 mg daily,      Vitals:   12/24/18 1928 12/25/18 0531  BP: 95/76 122/69  Pulse: 90 84  Resp: 19 19  Temp: 97.8 F (36.6 C) 97.8 F (36.6 C)  SpO2: 96% 98%   Controlled on 10/23 10. PAF. Amiodarone 200 mg daily. Cardiac rate controlled. Monitor with increased physical exertion.  11. Leukocytosis. recurrent.   WBCs 9 10/22  Afebrile  Urine culture blood cultures no  growth-final  - Empiric vancomycin and cefepime discontinued.   -continue to monitor serially.   -afebrile, wounds clean    12. Prediabetes. Levemir 5 units stopped  -should be able to manage just with diet control CBG (last 3)  Recent Labs    12/24/18 1743 12/24/18 2106 12/25/18 0540  GLUCAP 119* 147* 131*   13. Combined CHF   EF of 25-30%   Daily weights   Filed Weights   12/24/18 0500 12/24/18 1928 12/25/18 0500  Weight: 79.8 kg 77.4 kg 77.2 kg   Stable on 10/22  -may need to be sl "dry" to maintain ideal weight  -resumed demadex and send home on demadex 10mg  daily 14.  Hyponatremia  Sodium 135 10/22    LOS: 14 days A FACE TO FACE EVALUATION WAS PERFORMED  Meredith Staggers 12/25/2018, 11:15 AM

## 2018-12-29 ENCOUNTER — Telehealth: Payer: Self-pay | Admitting: *Deleted

## 2018-12-29 NOTE — Telephone Encounter (Signed)
Transitional Care call-I spoke with his mother who has dementia, his caregiver in the home today named Alden Benjamin, and Curtis [name].  His home phone is NOT portable so to actually speak to Curtis [name] I had to call the CNA's mobile # [phone] and she gave him the phone to speak with me. He does not have transportation at this time, but the CNA says they will be able to bring him to the appointment. She said she works for [employer] and will give this information to her Freight forwarder.      1. Are you/is patient experiencing any problems since coming home? Are there any questions regarding any aspect of care? NO 2. Are there any questions regarding medications administration/dosing? Are meds being taken as prescribed? Patient should review meds with caller to confirm I REVIEWED HIS MEDICATIONS WITH THE CNA IN THE HOME. SHE REPORTS THEY ARE GIVING HIM HIS MEDICATION. ALL MEDS ON HIS LIST ARE PRESENT. 3. Have there been any falls? NO 4. Has Home Health been to the house and/or have they contacted you? If not, have you tried to contact them? Can we help you contact them?THEY HAVE NOT. I CALLED [facility] AND SPOKE WITH THE CASE MANAGER (RONDA) AND THEY SHOULD BE REACHING OUT TO HIM TODAY. I INFORMED THEM THAT HIS MOTHER HAS DEMENTIA AND IF SHE ANSWERS THE PHONE THEY NEED TO BE AWARE OF THIS AND THEY WILL NEED TO SPEAK WITH THE CNA OR Curtis [name] HIMSELF.  5. Are bowels and bladder emptying properly? Are there any unexpected incontinence issues? If applicable, is patient following bowel/bladder programs?REPORTS NO PROBLEMS 6. Any fevers, problems with breathing, unexpected pain? NO 7. Are there any skin problems or new areas of breakdown? NO 8. Has the patient/family member arranged specialty MD follow up (ie cardiology/neurology/renal/surgical/etc)?  Can we help arrange?I HAVE GIVEN THE APPOINTMENT TO RETURN TO SEE OUR NP. IT WAS VERY DIFFICULT TO VERIFY IF HE WILL BE ABLE TO COME TO THE APPOINTMENT SINCE HE HAS NO  TRANSPORTATION BUT THE CNA SAID THAT THEY WILL BE BRINGING HIM. DUE TO THE DIFFICULTY OF THE CONVERSATION WITH ALL IN THE HOME, I COULD NOT HELP WITH OTHER APPOINTMENTS. AFTER HH PT/OT/RN MAKES CONTACT THEY WILL BE ABLE TO ASSIST. 9. Does the patient need any other services or support that we can help arrange?NO 10. Are caregivers following through as expected in assisting the patient?YES 11. Has the patient quit smoking, drinking alcohol, or using drugs as recommended? YES NO SMOKING OR DRIVING.  Appointment Wednesday January 06, 2019 @9 :00, arrive by 8:45 to see Danella Sensing NP and then Dr Naaman Plummer thereafter. ALERTED TO WATCH MAIL FOR PACKET FROM OUR OFFICE WITH PAPERS TO FILL OUT AND DIRECTIONS [place] [address]

## 2018-12-30 ENCOUNTER — Other Ambulatory Visit: Payer: Self-pay | Admitting: Cardiothoracic Surgery

## 2018-12-30 DIAGNOSIS — Z951 Presence of aortocoronary bypass graft: Secondary | ICD-10-CM

## 2019-01-01 ENCOUNTER — Other Ambulatory Visit: Payer: Self-pay

## 2019-01-01 ENCOUNTER — Inpatient Hospital Stay (HOSPITAL_COMMUNITY)
Admission: EM | Admit: 2019-01-01 | Discharge: 2019-01-04 | DRG: 683 | Disposition: A | Discharge status: Home / Self Care | Payer: Medicare Other | Attending: Family Medicine | Admitting: Family Medicine

## 2019-01-01 ENCOUNTER — Ambulatory Visit: Payer: Self-pay | Admitting: Cardiothoracic Surgery

## 2019-01-01 ENCOUNTER — Encounter (HOSPITAL_COMMUNITY): Payer: Self-pay

## 2019-01-01 DIAGNOSIS — F17210 Nicotine dependence, cigarettes, uncomplicated: Secondary | ICD-10-CM | POA: Diagnosis present

## 2019-01-01 DIAGNOSIS — I5042 Chronic combined systolic (congestive) and diastolic (congestive) heart failure: Secondary | ICD-10-CM | POA: Diagnosis not present

## 2019-01-01 DIAGNOSIS — R55 Syncope and collapse: Principal | ICD-10-CM

## 2019-01-01 DIAGNOSIS — Z7982 Long term (current) use of aspirin: Secondary | ICD-10-CM

## 2019-01-01 DIAGNOSIS — D72829 Elevated white blood cell count, unspecified: Secondary | ICD-10-CM | POA: Diagnosis present

## 2019-01-01 DIAGNOSIS — I480 Paroxysmal atrial fibrillation: Secondary | ICD-10-CM | POA: Diagnosis present

## 2019-01-01 DIAGNOSIS — Z880 Allergy status to penicillin: Secondary | ICD-10-CM

## 2019-01-01 DIAGNOSIS — I952 Hypotension due to drugs: Secondary | ICD-10-CM | POA: Diagnosis present

## 2019-01-01 DIAGNOSIS — N179 Acute kidney failure, unspecified: Secondary | ICD-10-CM | POA: Diagnosis not present

## 2019-01-01 DIAGNOSIS — R739 Hyperglycemia, unspecified: Secondary | ICD-10-CM | POA: Diagnosis not present

## 2019-01-01 DIAGNOSIS — I2510 Atherosclerotic heart disease of native coronary artery without angina pectoris: Secondary | ICD-10-CM | POA: Diagnosis present

## 2019-01-01 DIAGNOSIS — R570 Cardiogenic shock: Secondary | ICD-10-CM

## 2019-01-01 DIAGNOSIS — Z8249 Family history of ischemic heart disease and other diseases of the circulatory system: Secondary | ICD-10-CM

## 2019-01-01 DIAGNOSIS — Z951 Presence of aortocoronary bypass graft: Secondary | ICD-10-CM

## 2019-01-01 DIAGNOSIS — Z20828 Contact with and (suspected) exposure to other viral communicable diseases: Secondary | ICD-10-CM | POA: Diagnosis present

## 2019-01-01 DIAGNOSIS — I951 Orthostatic hypotension: Secondary | ICD-10-CM | POA: Diagnosis present

## 2019-01-01 DIAGNOSIS — Z952 Presence of prosthetic heart valve: Secondary | ICD-10-CM

## 2019-01-01 DIAGNOSIS — T46995A Adverse effect of other agents primarily affecting the cardiovascular system, initial encounter: Secondary | ICD-10-CM | POA: Diagnosis present

## 2019-01-01 LAB — CBC WITH DIFFERENTIAL/PLATELET
Abs Immature Granulocytes: 0.16 10*3/uL — ABNORMAL HIGH (ref 0.00–0.07)
Basophils Absolute: 0.1 10*3/uL (ref 0.0–0.1)
Basophils Relative: 1 %
Eosinophils Absolute: 0.1 10*3/uL (ref 0.0–0.5)
Eosinophils Relative: 1 %
HCT: 39.1 % (ref 39.0–52.0)
Hemoglobin: 12.2 g/dL — ABNORMAL LOW (ref 13.0–17.0)
Immature Granulocytes: 1 %
Lymphocytes Relative: 6 %
Lymphs Abs: 0.8 10*3/uL (ref 0.7–4.0)
MCH: 30 pg (ref 26.0–34.0)
MCHC: 31.2 g/dL (ref 30.0–36.0)
MCV: 96.3 fL (ref 80.0–100.0)
Monocytes Absolute: 1 10*3/uL (ref 0.1–1.0)
Monocytes Relative: 7 %
Neutro Abs: 12.3 10*3/uL — ABNORMAL HIGH (ref 1.7–7.7)
Neutrophils Relative %: 84 %
Platelets: 237 10*3/uL (ref 150–400)
RBC: 4.06 MIL/uL — ABNORMAL LOW (ref 4.22–5.81)
RDW: 17.1 % — ABNORMAL HIGH (ref 11.5–15.5)
WBC: 14.4 10*3/uL — ABNORMAL HIGH (ref 4.0–10.5)
nRBC: 0 % (ref 0.0–0.2)

## 2019-01-01 LAB — COMPREHENSIVE METABOLIC PANEL
ALT: 25 U/L (ref 0–44)
AST: 20 U/L (ref 15–41)
Albumin: 3.1 g/dL — ABNORMAL LOW (ref 3.5–5.0)
Alkaline Phosphatase: 93 U/L (ref 38–126)
Anion gap: 14 (ref 5–15)
BUN: 32 mg/dL — ABNORMAL HIGH (ref 8–23)
CO2: 25 mmol/L (ref 22–32)
Calcium: 8.4 mg/dL — ABNORMAL LOW (ref 8.9–10.3)
Chloride: 94 mmol/L — ABNORMAL LOW (ref 98–111)
Creatinine, Ser: 1.87 mg/dL — ABNORMAL HIGH (ref 0.61–1.24)
GFR calc Af Amer: 42 mL/min — ABNORMAL LOW (ref >60)
GFR calc non Af Amer: 37 mL/min — ABNORMAL LOW (ref >60)
Glucose, Bld: 238 mg/dL — ABNORMAL HIGH (ref 70–99)
Potassium: 3.4 mmol/L — ABNORMAL LOW (ref 3.5–5.1)
Sodium: 133 mmol/L — ABNORMAL LOW (ref 135–145)
Total Bilirubin: 1 mg/dL (ref 0.3–1.2)
Total Protein: 6.7 g/dL (ref 6.5–8.1)

## 2019-01-01 LAB — TROPONIN I (HIGH SENSITIVITY)
Troponin I (High Sensitivity): 16 ng/L (ref <18)
Troponin I (High Sensitivity): 15 ng/L (ref <18)

## 2019-01-01 MED ORDER — AMIODARONE HCL 200 MG PO TABS
200.0000 mg | ORAL_TABLET | Freq: Every day | ORAL | Status: DC
  Administered 2019-01-02 – 2019-01-04 (×3): 200 mg via ORAL
  Filled 2019-01-01 (×3): qty 1

## 2019-01-01 MED ORDER — ACETAMINOPHEN 650 MG RE SUPP: 650.0000 mg | Freq: Four times a day (QID) | RECTAL | Status: DC | PRN

## 2019-01-01 MED ORDER — SODIUM CHLORIDE 0.9 % IV BOLUS
1000.0000 mL | Freq: Once | INTRAVENOUS | Status: AC
  Administered 2019-01-01: 1000 mL via INTRAVENOUS

## 2019-01-01 MED ORDER — ONDANSETRON HCL 4 MG PO TABS: 4.0000 mg | ORAL_TABLET | Freq: Four times a day (QID) | ORAL | Status: DC | PRN

## 2019-01-01 MED ORDER — ACETAMINOPHEN 325 MG PO TABS: 650.0000 mg | ORAL_TABLET | Freq: Four times a day (QID) | ORAL | Status: DC | PRN

## 2019-01-01 MED ORDER — SODIUM CHLORIDE 0.9 % IV SOLN
INTRAVENOUS | Status: AC
  Administered 2019-01-02: via INTRAVENOUS

## 2019-01-01 MED ORDER — POTASSIUM CHLORIDE CRYS ER 20 MEQ PO TBCR
40.0000 meq | EXTENDED_RELEASE_TABLET | Freq: Once | ORAL | Status: AC
  Administered 2019-01-02: 40 meq via ORAL
  Filled 2019-01-01: qty 2

## 2019-01-01 MED ORDER — ONDANSETRON HCL 4 MG/2ML IJ SOLN: 4.0000 mg | Freq: Four times a day (QID) | INTRAMUSCULAR | Status: DC | PRN

## 2019-01-01 MED ORDER — HYDROCODONE-ACETAMINOPHEN 5-325 MG PO TABS: 1.0000 | ORAL_TABLET | ORAL | Status: DC | PRN

## 2019-01-01 NOTE — ED Triage Notes (Signed)
Pt BIB [facility] from home. Pt had a CABG two-three weeks ago at [facility]. No complications with surgery or medications according to pt. Today pt complains of weakness after going up steps.  Orthostatic VS at home with EMS: 99/61  84/62 After 500 ml bolus: 91/60  A&O x 4. No CP and no change in medications since surgery.   CBG: 283 with [facility]

## 2019-01-01 NOTE — H&P (Signed)
History and Physical    Curtis [name] FIE:[id number] DOB: 12-22-1950 DOA: 01/01/2019  PCP: Patient, No Pcp Per   Patient coming from: Home  Chief Complaint: Generalized weakness, near-syncope, hypotension   HPI: Curtis [name] is a 68 y.o. male with medical history significant for coronary artery disease status post CABG on 11/30/2018, EF 25 to 30%, status post mitral valve replacement, and now presenting to the emergency department for evaluation of generalized weakness, near syncope, and low blood pressure.  Patient was discharged from inpatient rehab on 12/25/2018, reports that he is doing okay back at home, but has had some generalized weakness and fatigue, and became acutely weak in general and felt as though he might pass out while walking up stairs today.  There was no chest pain associated with this and no palpitations.  He did not actually lose consciousness or fall.  He was reported to be diaphoretic at this time.  EMS reports orthostatic hypotension with systolic blood pressure 84 on standing.  He was given 500 cc normal saline with EMS prior to arrival in the ED.  The patient denies any recent fevers or chills, denies any cough, and denies shortness of breath.  He denies leg swelling or tenderness.  ED Course: Upon arrival to the ED, patient is found to be afebrile, saturating well on room air, and with blood pressure 111/63.  EKG features a sinus rhythm with nonspecific IVCD and repolarization abnormality.  Chemistry panel is notable for glucose of 238, potassium 3.4, and creatinine 1.87, up from 1.2 a week earlier.  CBC features a leukocytosis to 14,400.  High-sensitivity troponin is normal x2.  Patient was given a liter of normal saline in the emergency department, COVID-19 testing is in process, and cardiology was consulted by the ED physician.  Review of Systems:  All other systems reviewed and apart from HPI, are negative.  Past Medical History:  Diagnosis Date  . Coronary artery disease    . Elevated troponin 11/25/2018  . Tobacco use     Past Surgical History:  Procedure Laterality Date  . CORONARY ARTERY BYPASS GRAFT N/A 11/30/2018   Procedure: MEDIASTINAL POST-OP BLEED;  Surgeon: Wonda Olds, MD;  Location: [facility];  Service: Open Heart Surgery;  Laterality: N/A;  . CORONARY ARTERY BYPASS GRAFT N/A 11/30/2018   Procedure: CORONARY ARTERY BYPASS GRAFTING (CABG) x Three , using left internal mammary artery and left leg greater saphenous vein harvested endoscopically;  Surgeon: Wonda Olds, MD;  Location: [facility];  Service: Open Heart Surgery;  Laterality: N/A;  . LEFT HEART CATH AND CORONARY ANGIOGRAPHY N/A 11/24/2018   Procedure: LEFT HEART CATH AND CORONARY ANGIOGRAPHY;  Surgeon: Belva Crome, MD;  Location: [facility];  Service: Cardiovascular;  Laterality: N/A;  . MITRAL VALVE REPAIR N/A 11/30/2018   Procedure: MITRAL VALVE REPLACEMENT (MVR) USING MAGNA EASE SIZE 26mm;  Surgeon: Wonda Olds, MD;  Location: [facility];  Service: Open Heart Surgery;  Laterality: N/A;  . PLACEMENT OF IMPELLA LEFT VENTRICULAR ASSIST DEVICE N/A 11/30/2018   Procedure: PLACEMENT OF IMPELLA LEFT VENTRICULAR ASSIST DEVICE;  Surgeon: Wonda Olds, MD;  Location: [facility];  Service: Open Heart Surgery;  Laterality: N/A;  . REMOVAL OF IMPELLA LEFT VENTRICULAR ASSIST DEVICE N/A 12/04/2018   Procedure: REMOVAL OF IMPELLA LEFT VENTRICULAR ASSIST DEVICE.  EVACUATION OF LEFT HEMOTHORAX;  Surgeon: Wonda Olds, MD;  Location: [facility];  Service: Open Heart Surgery;  Laterality: N/A;  . STERNAL INCISION RECLOSURE  12/04/2018  Procedure: Sternal Plating with Sternal Rewiring;  Surgeon: Linden Dolin, MD;  Location: [facility];  Service: Open Heart Surgery;;  . TEE WITHOUT CARDIOVERSION N/A 11/26/2018   Procedure: TRANSESOPHAGEAL ECHOCARDIOGRAM (TEE);  Surgeon: Jake Bathe, MD;  [facility] ENDOSCOPY;  Service: Cardiovascular;  Laterality: N/A;  . TEE WITHOUT CARDIOVERSION N/A 11/30/2018    Procedure: TRANSESOPHAGEAL ECHOCARDIOGRAM (TEE);  Surgeon: Linden Dolin, MD;  Location: [facility] OR;  Service: Open Heart Surgery;  Laterality: N/A;  . TEE WITHOUT CARDIOVERSION N/A 12/04/2018   Procedure: TRANSESOPHAGEAL ECHOCARDIOGRAM (TEE);  Surgeon: Linden Dolin, MD;  Location: [facility] OR;  Service: Open Heart Surgery;  Laterality: N/A;     reports that he has been smoking cigarettes. He has been smoking about 0.25 packs per day. He has never used smokeless tobacco. He reports that he does not drink alcohol or use drugs.  Allergies  Allergen Reactions  . Penicillins Rash and Other (See Comments)    Tolerated cefepime 12/2018  Did it involve swelling of the face/tongue/throat, SOB, or low BP? Unknown Did it involve sudden or severe rash/hives, skin peeling, or any reaction on the inside of your mouth or nose? Yes Did you need to seek medical attention at a hospital or doctor's office? Unknown When did it last happen? childhood If all above answers are "NO", may proceed with cephalosporin use.     Family History  Problem Relation Age of Onset  . Heart attack Father   . Heart attack Mother      Prior to Admission medications   Medication Sig Start Date End Date Taking? Authorizing Provider  amiodarone (PACERONE) 200 MG tablet Take 1 tablet (200 mg total) by mouth 2 (two) times daily. For 5 days then take Amiodarone 200 mg daily thereafter Patient taking differently: Take 200 mg by mouth daily.  12/23/18  Yes Angiulli, Mcarthur Rossetti, PA-C  aspirin EC 325 MG EC tablet Take 1 tablet (325 mg total) by mouth daily. 12/12/18  Yes Doree Fudge M, PA-C  guaiFENesin (MUCINEX) 600 MG 12 hr tablet Take 1 tablet (600 mg total) by mouth 2 (two) times daily. 12/23/18  Yes Angiulli, Mcarthur Rossetti, PA-C  Multiple Vitamin (MULTIVITAMIN WITH MINERALS) TABS tablet Take 1 tablet by mouth daily. 12/12/18  Yes Doree Fudge M, PA-C  pantoprazole (PROTONIX) 40 MG tablet Take 1 tablet (40 mg total)  by mouth daily. 12/24/18  Yes Angiulli, Mcarthur Rossetti, PA-C  rosuvastatin (CRESTOR) 40 MG tablet Take 1 tablet (40 mg total) by mouth daily at 6 PM. 12/23/18  Yes Angiulli, Mcarthur Rossetti, PA-C  sacubitril-valsartan (ENTRESTO) 24-26 MG Take 1 tablet by mouth 2 (two) times daily. 12/23/18  Yes Angiulli, Mcarthur Rossetti, PA-C  spironolactone (ALDACTONE) 25 MG tablet Take 1 tablet (25 mg total) by mouth daily. 12/23/18  Yes Angiulli, Mcarthur Rossetti, PA-C  torsemide (DEMADEX) 10 MG tablet Take 1 tablet (10 mg total) by mouth daily. 12/24/18  Yes Charlton Amor, PA-C    Physical Exam: Vitals:   01/01/19 2215 01/01/19 2230 01/01/19 2245 01/01/19 2300  BP: 127/61 125/67 129/89 126/67  Pulse: 77 76 79 76  Resp: 20 (!) 21 (!) 23 17  Temp:      TempSrc:      SpO2: 100% 99% 95% 98%  Weight:      Height:        Constitutional: NAD, calm  Eyes: PERTLA, lids and conjunctivae normal ENMT: Mucous membranes are moist. Posterior pharynx clear of any exudate or lesions.  Neck: normal, supple, no masses, no thyromegaly Respiratory: no wheezing, no crackles. No accessory muscle use.  Cardiovascular: S1 & S2 heard, regular rate and rhythm. No extremity edema.  Abdomen: No distension, no tenderness, soft. Bowel sounds normal.  Musculoskeletal: no clubbing / cyanosis. No joint deformity upper and lower extremities.  Skin: erythema about surgical site at sternum. Warm, dry, well-perfused. Neurologic: No facial asymmetry. Sensation intact. Moving all extremities.  Psychiatric: Alert and oriented to person, place, and situation. Calm, cooperative.     Labs on Admission: I have personally reviewed following labs and imaging studies  CBC: Recent Labs  Lab 01/01/19 1853  WBC 14.4*  NEUTROABS 12.3*  HGB 12.2*  HCT 39.1  MCV 96.3  PLT 237   Basic Metabolic Panel: Recent Labs  Lab 01/01/19 1853  NA 133*  K 3.4*  CL 94*  CO2 25  GLUCOSE 238*  BUN 32*  CREATININE 1.87*  CALCIUM 8.4*   GFR: Estimated Creatinine  Clearance: 38.9 mL/min (A) (by C-G formula based on SCr of 1.87 mg/dL (H)). Liver Function Tests: Recent Labs  Lab 01/01/19 1853  AST 20  ALT 25  ALKPHOS 93  BILITOT 1.0  PROT 6.7  ALBUMIN 3.1*   No results for input(s): LIPASE, AMYLASE in the last 168 hours. No results for input(s): AMMONIA in the last 168 hours. Coagulation Profile: No results for input(s): INR, PROTIME in the last 168 hours. Cardiac Enzymes: No results for input(s): CKTOTAL, CKMB, CKMBINDEX, TROPONINI in the last 168 hours. BNP (last 3 results) No results for input(s): PROBNP in the last 8760 hours. HbA1C: No results for input(s): HGBA1C in the last 72 hours. CBG: No results for input(s): GLUCAP in the last 168 hours. Lipid Profile: No results for input(s): CHOL, HDL, LDLCALC, TRIG, CHOLHDL, LDLDIRECT in the last 72 hours. Thyroid Function Tests: No results for input(s): TSH, T4TOTAL, FREET4, T3FREE, THYROIDAB in the last 72 hours. Anemia Panel: No results for input(s): VITAMINB12, FOLATE, FERRITIN, TIBC, IRON, RETICCTPCT in the last 72 hours. Urine analysis:    Component Value Date/Time   COLORURINE YELLOW 12/07/2018 1005   APPEARANCEUR CLEAR 12/07/2018 1005   LABSPEC 1.014 12/07/2018 1005   PHURINE 8.0 12/07/2018 1005   GLUCOSEU NEGATIVE 12/07/2018 1005   HGBUR MODERATE (A) 12/07/2018 1005   BILIRUBINUR NEGATIVE 12/07/2018 1005   KETONESUR NEGATIVE 12/07/2018 1005   PROTEINUR NEGATIVE 12/07/2018 1005   NITRITE NEGATIVE 12/07/2018 1005   LEUKOCYTESUR NEGATIVE 12/07/2018 1005   Sepsis Labs: @LABRCNTIP (procalcitonin:4,lacticidven:4) )No results found for this or any previous visit (from the past 240 hour(s)).   Radiological Exams on Admission: No results found.  EKG: Independently reviewed. Sinus rhythm, non-specific IVCD with repolarization abnormality.   Assessment/Plan   1. Orthostatic hypotension; near-syncope  - Presents with near-syncope, was reported to be diaphoretic with orthostatic  hypotension and was given 500 cc IVF with EMS pta  - Cardiology is consulting and much appreciated, recommending echocardiogram, continued IVF hydration, hold Entresto and diuretics    2. Acute kidney injury  - SCr is 1.87 on admission, up from an apparent baseline of 1-1.1  - Likely prerenal azotemia given low BP with concomitant angiotensin II blockade  - He was given 500 cc IVF with EMS and 1 liter NS in ED  - Check FEUrea, renally-dose medications, hold diuretics and Entresto, continue gentle IVF hydration overnight, and repeat chem panel in am    3. CAD  - No anginal complaints  - Continue ASA and statin    4.  Chronic systolic CHF  - Appears compensated   - Hold diuretics and Entresto in light of low BP and AKI, follow daily wt and I/O's    5. PAF  - Patient developed PAF during hospitalization last month and was started on amiodarone  - In sinus rhythm in ED  - CHADS-VASc is at least 43 (age, CAD, CHF), not anticoagulated pta  - Continue amiodarone   6. Hyperglycemia  - Serum glucose is 238 in ED  - Check A1c, monitor CBG's, use low-intensity SSI with Novolog as needed     PPE: mask, face shield  DVT prophylaxis: sq heparin  Code Status: Full  Family Communication: Discussed with patient  Consults called: Cardiology consulted by ED physician  Admission status: Observation     Briscoe Deutscherimothy S Opyd, MD [facility] Pager [phone]  If 7PM-7AM, please contact night-coverage [email or web address] Password [email or web address]  01/01/2019, 11:33 PM

## 2019-01-01 NOTE — ED Notes (Signed)
Admitted MD at the bedside. 

## 2019-01-01 NOTE — Consult Note (Signed)
Cardiology Consult    Patient ID: Curtis [name] MRN: [id number], DOB/AGE: [birth year]   Admit date: 01/01/2019 Date of Consult: 01/01/2019  Primary Physician: Patient, [name]: Chrystie Nose, MD Requesting Provider: Graciella Freer, PA  Patient Profile    Curtis [name] is a 68 y.o. male with a history of CAD status post CABG x3, MR status post MVR on 11/30/2018 with complicated hospital course following this surgery including postoperative cardiogenic shock in setting of EF of 25% requiring inotrope therapy and Impella placement, acute blood loss anemia, who is being seen today for the evaluation of near syncope at the request of Graciella Freer, [place].  Past Medical History   Past Medical History:  Diagnosis Date   Coronary artery disease    Elevated troponin 11/25/2018   Tobacco use     Past Surgical History:  Procedure Laterality Date   CORONARY ARTERY BYPASS GRAFT N/A 11/30/2018   Procedure: MEDIASTINAL POST-OP BLEED;  Surgeon: Linden Dolin, MD;  Location: [facility] OR;  Service: Open Heart Surgery;  Laterality: N/A;   CORONARY ARTERY BYPASS GRAFT N/A 11/30/2018   Procedure: CORONARY ARTERY BYPASS GRAFTING (CABG) x Three , using left internal mammary artery and left leg greater saphenous vein harvested endoscopically;  Surgeon: Linden Dolin, MD;  Location: [facility] OR;  Service: Open Heart Surgery;  Laterality: N/A;   LEFT HEART CATH AND CORONARY ANGIOGRAPHY N/A 11/24/2018   Procedure: LEFT HEART CATH AND CORONARY ANGIOGRAPHY;  Surgeon: Lyn Records, MD;  Location: [facility] INVASIVE CV LAB;  Service: Cardiovascular;  Laterality: N/A;   MITRAL VALVE REPAIR N/A 11/30/2018   Procedure: MITRAL VALVE REPLACEMENT (MVR) USING MAGNA EASE SIZE 27mm;  Surgeon: Linden Dolin, MD;  Location: [facility] OR;  Service: Open Heart Surgery;  Laterality: N/A;   PLACEMENT OF IMPELLA LEFT VENTRICULAR ASSIST DEVICE N/A 11/30/2018   Procedure: PLACEMENT OF IMPELLA LEFT VENTRICULAR ASSIST  DEVICE;  Surgeon: Linden Dolin, MD;  Location: [facility] OR;  Service: Open Heart Surgery;  Laterality: N/A;   REMOVAL OF IMPELLA LEFT VENTRICULAR ASSIST DEVICE N/A 12/04/2018   Procedure: REMOVAL OF IMPELLA LEFT VENTRICULAR ASSIST DEVICE.  EVACUATION OF LEFT HEMOTHORAX;  Surgeon: Linden Dolin, MD;  Location: [facility] OR;  Service: Open Heart Surgery;  Laterality: N/A;   STERNAL INCISION RECLOSURE  12/04/2018   Procedure: Sternal Plating with Sternal Rewiring;  Surgeon: Linden Dolin, MD;  Location: [facility] OR;  Service: Open Heart Surgery;;   TEE WITHOUT CARDIOVERSION N/A 11/26/2018   Procedure: TRANSESOPHAGEAL ECHOCARDIOGRAM (TEE);  Surgeon: Jake Bathe, MD;  Location: [facility] ENDOSCOPY;  Service: Cardiovascular;  Laterality: N/A;   TEE WITHOUT CARDIOVERSION N/A 11/30/2018   Procedure: TRANSESOPHAGEAL ECHOCARDIOGRAM (TEE);  Surgeon: Linden Dolin, MD;  Location: [facility] OR;  Service: Open Heart Surgery;  Laterality: N/A;   TEE WITHOUT CARDIOVERSION N/A 12/04/2018   Procedure: TRANSESOPHAGEAL ECHOCARDIOGRAM (TEE);  Surgeon: Linden Dolin, MD;  Location: [facility] OR;  Service: Open Heart Surgery;  Laterality: N/A;     Allergies  Allergies  Allergen Reactions   Penicillins Rash and Other (See Comments)    Tolerated cefepime 12/2018  Did it involve swelling of the face/tongue/throat, SOB, or low BP? Unknown Did it involve sudden or severe rash/hives, skin peeling, or any reaction on the inside of your mouth or nose? Yes Did you need to seek medical attention at a hospital or doctor's office? Unknown When did it last happen? childhood If all above answers are "NO", may proceed with  cephalosporin use.     History of Present Illness    Curtis [name] is a 68 y.o. male with a history of CAD status post CABG x3, MR status post MVR on 11/30/2018 with complicated hospital course following this surgery including postoperative cardiogenic shock in setting of EF of 25% requiring inotrope therapy and  Impella placement, acute blood loss anemia, who is being seen today for the evaluation of near syncope.  The patient was recently discharged after a complex hospitalization.  He was initially admitted on 11/24/2018 with worsening shortness of breath, tachycardia and tachypnea.  He had had multiple syncopal episodes on his way to the hospital.  He was found to have an NSTEMI and underwent cardiac catheterization which revealed severe coronary artery disease for which he underwent treatment with CABG x3 and MVR on 11/30/2018.  He had a complicated postoperative course including cardiogenic shock in the setting of an EF of 25%.  He required treatment with milrinone and Impella placement.  He also developed mediastinal bleeding which required emergent return to the OR with removal of the Impella and evacuation of left hemothorax on 12/04/2018.  He eventually was discharged to rehab on 12/11/2018 and participated in ongoing rehabilitation with PT and OT with discharge to home on 12/25/2018.  The patient reports that he overall was doing okay at home.  He was minimally active but was able to walk around his home and climb a flight of stairs.  He has home health nursing who has been coming in to help him as well.  On the day of admission the patient reports that he climbed the flight of stairs in his home twice which was more exertion than normal for him.  On the second to last step before he reached the landing at the top of the stairs, his home health nurse noted that he was weak and nearly passed out.  The patient reports that he got lightheaded and dizzy with significant leg weakness making it difficult for him to make it to the next step.  He denies chest pain, shortness of breath, palpitations associated with the event.  Given his lightheadedness, his home health nurse took his blood pressure and he was noted to be hypotensive with positive orthostatics.  EMS was called and he was found to have a blood pressure of  84/62.  In discussion with the patient, he is unsure what his blood pressures at home have been running since his recent discharge.  He has been taking his medications without issue and has not had any difficulty with volume overload notably denying orthopnea, PND, lower extremity edema.  He reports that he has had normal p.o. intake including hydration.  Inpatient Medications      Family History    Family History  Problem Relation Age of Onset   Heart attack Father    Heart attack Mother    He indicated that the status of his mother is unknown. He indicated that the status of his father is unknown.   Social History    Social History   Socioeconomic History   Marital status: Divorced    Spouse name: Not on file   Number of children: Not on file   Years of education: Not on file   Highest education level: Not on file  Occupational History   Not on file  Social Needs   Financial resource strain: Not hard at all   Food insecurity    Worry: Never true    Inability: Never  true   Transportation needs    Medical: No    Non-medical: No  Tobacco Use   Smoking status: Current Some Day Smoker    Packs/day: 0.25    Types: Cigarettes   Smokeless tobacco: Never Used  Substance and Sexual Activity   Alcohol use: Never    Frequency: Never   Drug use: Never   Sexual activity: Not Currently  Lifestyle   Physical activity    Days per week: 3 days    Minutes per session: 60 min   Stress: Not at all  Relationships   Social connections    Talks on phone: More than three times a week    Gets together: Never    Attends religious service: Never    Active member of club or organization: Yes    Attends meetings of clubs or organizations: Never    Relationship status: Divorced   Intimate partner violence    Fear of current or ex partner: No    Emotionally abused: No    Physically abused: No    Forced sexual activity: No  Other Topics Concern   Not on file    Social History Narrative   Not on file     Review of Systems    General:  No chills, fever, night sweats or weight changes.  Cardiovascular: As per HPI Dermatological: No rash, lesions/masses Respiratory: As per HPI Urologic: No hematuria, dysuria Abdominal:   No nausea, vomiting, diarrhea, bright red blood per rectum, melena, or hematemesis Neurologic:  No visual changes, wkns, changes in mental status. All other systems reviewed and are otherwise negative except as noted above.  Physical Exam    Blood pressure 126/67, pulse 76, temperature 97.8 F (36.6 C), temperature source Oral, resp. rate 17, height  (1.753 m), weight 81.6 kg, SpO2 98 %.  General: Appears chronically ill, NAD Psych: Normal affect. Neuro: Alert and oriented X 3. Moves all extremities spontaneously. HEENT: Normal  Neck: Supple without bruits or JVD. Lungs:  Laying flat and breathing comfortably. Resp regular and unlabored, CTA.   Heart: RRR no s3, s4, or murmurs.  Sternal incision with several areas of erythema and some mild swelling at the midpoint of his sternum.  Patient is unsure whether this is changed from prior.  No drainage or apparent fluctuance. Abdomen: Soft, non-tender, non-distended, BS + x 4.  Extremities: Somewhat cool bilateral lower extremities.  No clubbing, cyanosis or edema. DP/PT/Radials 2+ and equal bilaterally.  Labs    Cardiac Enzymes Recent Labs  Lab 01/01/19 1853 01/01/19 2104  TROPONINIHS 16 15      Lab Results  Component Value Date   WBC 14.4 (H) 01/01/2019   HGB 12.2 (L) 01/01/2019   HCT 39.1 01/01/2019   MCV 96.3 01/01/2019   PLT 237 01/01/2019    Recent Labs  Lab 01/01/19 1853  NA 133*  K 3.4*  CL 94*  CO2 25  BUN 32*  CREATININE 1.87*  CALCIUM 8.4*  PROT 6.7  BILITOT 1.0  ALKPHOS 93  ALT 25  AST 20  GLUCOSE 238*   Lab Results  Component Value Date   CHOL 233 (H) 11/25/2018   HDL 31 (L) 11/25/2018   LDLCALC 171 (H) 11/25/2018   TRIG 155  (H) 11/25/2018   Lab Results  Component Value Date   DDIMER 0.36 11/30/2018     Radiology Studies    Dg Chest 1 View  Result Date: 12/04/2018 CLINICAL DATA:  Open heart surgery EXAM: CHEST  1 VIEW COMPARISON:  12/04/2018, 8:20 a.m. FINDINGS: Interval repositioning of a left-sided chest tube with resolution of a previously seen fluid collection about the left upper lobe. There is likely a small, layering left pleural effusion. Interval repositioning of right-sided chest tube. Mediastinal drainage tubes remain in position. The right lung is normally aerated. There has been interval endotracheal intubation, tube tip over the mid trachea. Esophagogastric tube with tip and side port below the diaphragm. Right neck pulmonary artery catheter remains with tip projecting over the pulmonary outflow tract. Interval removal of Impella pump. Cardiomegaly status post median sternotomy with aortic valve prosthesis. IMPRESSION: 1. Interval repositioning of a left-sided chest tube with resolution of a previously seen fluid collection about the left upper lobe. There is likely a small, layering left pleural effusion. Interval repositioning of right-sided chest tube. Mediastinal drainage tubes remain in position. The right lung is normally aerated. 2. There has been interval endotracheal intubation, tube tip over the mid trachea. Esophagogastric tube with tip and side port below the diaphragm. Right neck pulmonary artery catheter remains with tip projecting over the pulmonary outflow tract. 3.  Interval removal of Impella pump. 4. Cardiomegaly status post median sternotomy with aortic valve prosthesis. Electronically Signed   By: Lauralyn Primes M.D.   On: 12/04/2018 16:59   Dg Chest 2 View  Result Date: 12/11/2018 CLINICAL DATA:  Pleural effusion EXAM: CHEST - 2 VIEW COMPARISON:  Two days ago FINDINGS: Chronic cardiomegaly. Mitral valve replacement. CABG. Right PICC with tip at the SVC. Chronic mild interstitial  coarsening. There is no edema, consolidation, or pneumothorax. Trace pleural fluid on both sides. Hyperinflation. IMPRESSION: Trace pleural effusions. Interval removal of chest tube with no visible pneumothorax. Electronically Signed   By: Marnee Spring M.D.   On: 12/11/2018 10:47   Dg Ankle 2 Views Left  Result Date: 12/16/2018 CLINICAL DATA:  Pain, no injury EXAM: LEFT ANKLE - 2 VIEW COMPARISON:  None. FINDINGS: No fracture or dislocation of the left ankle. There is mild medial ankle arthrosis. Small plantar and Achilles calcaneal spurs. Soft tissues are unremarkable. IMPRESSION: No fracture or dislocation of the left ankle. There is mild medial ankle arthrosis. Small plantar and Achilles calcaneal spurs. Soft tissues are unremarkable. Electronically Signed   By: Lauralyn Primes M.D.   On: 12/16/2018 12:13   Ct Head Wo Contrast  Result Date: 12/07/2018 CLINICAL DATA:  Altered level of consciousness. EXAM: CT HEAD WITHOUT CONTRAST TECHNIQUE: Contiguous axial images were obtained from the base of the skull through the vertex without intravenous contrast. COMPARISON:  None. FINDINGS: Brain: No evidence of acute infarction, hemorrhage, extra-axial collection, ventriculomegaly, or mass effect. Small left basal ganglia lacunar infarct. Generalized cerebral atrophy. Periventricular white matter low attenuation likely secondary to microangiopathy. Vascular: Cerebrovascular atherosclerotic calcifications are noted. Skull: Negative for fracture or focal lesion. Sinuses/Orbits: Visualized portions of the orbits are unremarkable. Mastoid sinuses are clear. Mucous retention cyst in bilateral maxillary sinuses. Other: None. IMPRESSION: 1. No acute intracranial pathology. 2. Chronic microvascular disease and cerebral atrophy. Electronically Signed   By: Elige Ko   On: 12/07/2018 09:06   Ct Angio Chest Pe W Or Wo Contrast  Result Date: 12/07/2018 CLINICAL DATA:  Hypoxia, abdominal distention. EXAM: CT ANGIOGRAPHY  CHEST CT ABDOMEN AND PELVIS WITH CONTRAST TECHNIQUE: Multidetector CT imaging of the chest was performed using the standard protocol during bolus administration of intravenous contrast. Multiplanar CT image reconstructions and MIPs were obtained to evaluate the vascular anatomy. Multidetector CT imaging of the  abdomen and pelvis was performed using the standard protocol during bolus administration of intravenous contrast. CONTRAST:  OMNIPAQUE IOHEXOL 350 MG/ML SOLN COMPARISON:  CT scan of November 27, 2018. FINDINGS: CTA CHEST FINDINGS Cardiovascular: Satisfactory opacification of the pulmonary arteries to the segmental level. No evidence of pulmonary embolism. Mild cardiomegaly is noted. Atherosclerosis of thoracic aorta is noted without aneurysm formation. Status post mitral valve repair. Coronary artery calcifications are noted. No pericardial effusion. Mediastinum/Nodes: The esophagus is unremarkable. No significant adenopathy is noted. Thyroid gland is unremarkable. Mediastinal drain is noted anteriorly. Lungs/Pleura: Bilateral chest tubes are noted without definite pneumothorax. Mild bilateral lower lobe subsegmental atelectasis is noted. Musculoskeletal: Sternotomy wires are noted. No other significant osseous abnormality is noted. Review of the MIP images confirms the above findings. CT ABDOMEN and PELVIS FINDINGS Hepatobiliary: No focal liver abnormality is seen. No gallstones, gallbladder wall thickening, or biliary dilatation. Pancreas: Unremarkable. No pancreatic ductal dilatation or surrounding inflammatory changes. Spleen: Normal in size without focal abnormality. Adrenals/Urinary Tract: Adrenal glands appear normal. 13 mm calculus is noted in upper pole collecting system of left kidney. 8 mm calculus is also noted in upper pole of left kidney. Smaller nonobstructive calculus is seen in lower pole collecting system of right kidney. 1.9 cm rounded hyperdense abnormality is seen arising from lower  pole right kidney. Large left parapelvic cysts are noted. No definite hydronephrosis or renal obstruction is noted. Urinary bladder catheter is noted. Stomach/Bowel: Stomach is within normal limits. Appendix appears normal. No evidence of bowel wall thickening, distention, or inflammatory changes. Vascular/Lymphatic: Aortic atherosclerosis. No enlarged abdominal or pelvic lymph nodes. Reproductive: Mild prostatic enlargement is noted. Other: No abdominal wall hernia or abnormality. No abdominopelvic ascites. Musculoskeletal: No acute or significant osseous findings. Review of the MIP images confirms the above findings. IMPRESSION: No definite evidence of pulmonary embolus. Bilateral chest tubes are noted without pneumothorax. Mild bilateral posterior basilar subsegmental atelectasis is noted. 1.9 cm rounded hyperdense abnormality is seen arising from lower pole of right kidney which may represent hyperdense cyst, but neoplasm cannot be excluded. Ultrasound is recommended for further evaluation. Large nonobstructive left renal calculi are noted. Large left renal pelvic cysts are noted. Mild prostatic enlargement. Aortic Atherosclerosis (ICD10-I70.0). Electronically Signed   By: Lupita Raider M.D.   On: 12/07/2018 09:15   Ct Abdomen Pelvis W Contrast  Result Date: 12/07/2018 CLINICAL DATA:  Hypoxia, abdominal distention. EXAM: CT ANGIOGRAPHY CHEST CT ABDOMEN AND PELVIS WITH CONTRAST TECHNIQUE: Multidetector CT imaging of the chest was performed using the standard protocol during bolus administration of intravenous contrast. Multiplanar CT image reconstructions and MIPs were obtained to evaluate the vascular anatomy. Multidetector CT imaging of the abdomen and pelvis was performed using the standard protocol during bolus administration of intravenous contrast. CONTRAST:  OMNIPAQUE IOHEXOL 350 MG/ML SOLN COMPARISON:  CT scan of November 27, 2018. FINDINGS: CTA CHEST FINDINGS Cardiovascular: Satisfactory  opacification of the pulmonary arteries to the segmental level. No evidence of pulmonary embolism. Mild cardiomegaly is noted. Atherosclerosis of thoracic aorta is noted without aneurysm formation. Status post mitral valve repair. Coronary artery calcifications are noted. No pericardial effusion. Mediastinum/Nodes: The esophagus is unremarkable. No significant adenopathy is noted. Thyroid gland is unremarkable. Mediastinal drain is noted anteriorly. Lungs/Pleura: Bilateral chest tubes are noted without definite pneumothorax. Mild bilateral lower lobe subsegmental atelectasis is noted. Musculoskeletal: Sternotomy wires are noted. No other significant osseous abnormality is noted. Review of the MIP images confirms the above findings. CT ABDOMEN and PELVIS FINDINGS  Hepatobiliary: No focal liver abnormality is seen. No gallstones, gallbladder wall thickening, or biliary dilatation. Pancreas: Unremarkable. No pancreatic ductal dilatation or surrounding inflammatory changes. Spleen: Normal in size without focal abnormality. Adrenals/Urinary Tract: Adrenal glands appear normal. 13 mm calculus is noted in upper pole collecting system of left kidney. 8 mm calculus is also noted in upper pole of left kidney. Smaller nonobstructive calculus is seen in lower pole collecting system of right kidney. 1.9 cm rounded hyperdense abnormality is seen arising from lower pole right kidney. Large left parapelvic cysts are noted. No definite hydronephrosis or renal obstruction is noted. Urinary bladder catheter is noted. Stomach/Bowel: Stomach is within normal limits. Appendix appears normal. No evidence of bowel wall thickening, distention, or inflammatory changes. Vascular/Lymphatic: Aortic atherosclerosis. No enlarged abdominal or pelvic lymph nodes. Reproductive: Mild prostatic enlargement is noted. Other: No abdominal wall hernia or abnormality. No abdominopelvic ascites. Musculoskeletal: No acute or significant osseous findings.  Review of the MIP images confirms the above findings. IMPRESSION: No definite evidence of pulmonary embolus. Bilateral chest tubes are noted without pneumothorax. Mild bilateral posterior basilar subsegmental atelectasis is noted. 1.9 cm rounded hyperdense abnormality is seen arising from lower pole of right kidney which may represent hyperdense cyst, but neoplasm cannot be excluded. Ultrasound is recommended for further evaluation. Large nonobstructive left renal calculi are noted. Large left renal pelvic cysts are noted. Mild prostatic enlargement. Aortic Atherosclerosis (ICD10-I70.0). Electronically Signed   By: Marijo Conception M.D.   On: 12/07/2018 09:15   Dg Chest Port 1 View  Result Date: 12/09/2018 CLINICAL DATA:  Pleural effusion.  CABG 5 days ago. EXAM: PORTABLE CHEST 1 VIEW COMPARISON:  CTA chest 12/07/2018 and one-view chest 12/07/2018. FINDINGS: Heart is enlarged. Moderate pulmonary vascular congestion is noted. A left-sided chest tube is in place. Bilateral chest tubes are in place. There is no pneumothorax. Right IJ sheath remains. A mediastinal drain is in place. IMPRESSION: 1. Cardiomegaly and moderate pulmonary vascular congestion. 2. Low lung volumes. 3. Support apparatus is stable.  No pneumothorax. Electronically Signed   By: San Morelle M.D.   On: 12/09/2018 08:17   Dg Chest Port 1 View  Result Date: 12/07/2018 CLINICAL DATA:  Status post cardiac surgery EXAM: PORTABLE CHEST 1 VIEW COMPARISON:  12/06/2018 FINDINGS: Cardiac shadow is mildly enlarged but stable. Postsurgical changes are again seen. Right jugular sheath is again noted and stable. Bilateral thoracostomy catheters and mediastinal drain are seen. No pneumothorax is noted. Improved aeration is noted in the left base. IMPRESSION: Tubes and lines as described above. No focal infiltrate is noted. Electronically Signed   By: Inez Catalina M.D.   On: 12/07/2018 07:45   Dg Chest Port 1 View  Result Date:  12/06/2018 CLINICAL DATA:  Chest tube in place. EXAM: PORTABLE CHEST 1 VIEW COMPARISON:  12/05/2018 FINDINGS: Right IJ central venous sheath remains in place with tip over the SVC. Bilateral basilar chest tubes as well as mediastinal drain unchanged. Lungs are adequately inflated with stable left base opacification likely small effusion with associated atelectasis. Minimal linear atelectasis right base. No pneumothorax. Mild stable cardiomegaly. Remainder of the exam is unchanged. IMPRESSION: Stable small left effusion with associated basilar atelectasis. Minimal linear atelectasis right base. Tubes and lines as described. Electronically Signed   By: Marin Olp M.D.   On: 12/06/2018 08:26   Dg Chest Port 1 View  Result Date: 12/05/2018 CLINICAL DATA:  Removal of the left ventricular assist device. EXAM: PORTABLE CHEST 1 VIEW COMPARISON:  12/04/2018  FINDINGS: Swan-Ganz catheter with the tip projecting over the right ventricular outflow tract. Nasogastric tube projecting over the stomach. Endotracheal tube with the tip 2.4 cm above the carina. Bilateral chest tubes in unchanged position. No pneumothorax. Small left pleural effusion unchanged from the prior exam. Mild left basilar atelectasis. Stable cardiomediastinal silhouette. Prior median sternotomy, CABG, and valve replacement. IMPRESSION: 1. Support lines and tubing in satisfactory unchanged position. 2. Bilateral chest tubes without a pneumothorax. 3. Trace left pleural effusion and left basilar atelectasis. Electronically Signed   By: Elige Ko   On: 12/05/2018 07:24   Dg Chest Port 1 View  Result Date: 12/04/2018 CLINICAL DATA:  Status post coronary bypass grafting EXAM: PORTABLE CHEST 1 VIEW COMPARISON:  12/03/2018 FINDINGS: Cardiac shadow is enlarged. Postsurgical changes are again seen. Impella catheter is noted and stable in appearance. Swan-Ganz catheter is noted in the pulmonary outflow tract. Mediastinal drain and bilateral thoracostomy  catheters are seen. No pneumothorax is noted although there is increased density in the left apex laterally when compared with the prior exam. This is consistent with a loculated fluid collection and is significantly larger than that seen on the prior exam now measuring approximately 8.8 by 5.4 cm in greatest dimension. Increase in left-sided pleural effusion is noted as well. No pneumothorax is noted. IMPRESSION: Increasing ovoid opacification in the lateral aspect of left upper hemithorax consistent with a loculated fluid collection. The possibility of focal hematoma deserves consideration. Slight increase in overall left pleural effusion inferiorly. Tubes and lines as described above. These results will be called to the ordering clinician or representative by the Radiologist Assistant, and communication documented in the PACS or zVision Dashboard. Electronically Signed   By: Alcide Clever M.D.   On: 12/04/2018 08:42   Dg Chest Port 1 View  Result Date: 12/03/2018 CLINICAL DATA:  Shortness of breath tonight. Recent open heart surgery CABG. EXAM: PORTABLE CHEST 1 VIEW COMPARISON:  Radiograph yesterday. FINDINGS: Removal of endotracheal and enteric tubes. Impella device unchanged in positioning, slightly medially located. Right internal jugular Swan-Ganz catheter tip in the region of the main pulmonary outflow tract. Bilateral chest tubes and mediastinal drain in place. Overall lower lung volumes after extubation. Increasing peripheral opacity in the left upper lateral hemithorax that appears extrapleural. No pneumothorax. Unchanged heart size and mediastinal contours. Slight increasing bibasilar opacities favoring atelectasis. Increasing central lung opacities. IMPRESSION: 1. Lower lung volumes after extubation. 2. Developing peripheral opacity in the left upper lateral hemithorax which appears extrapleural and likely represents partially loculated pleural fluid. Increasing bibasilar opacities/atelectasis. 3.  Increasing central lung opacities may represent pulmonary edema. Electronically Signed   By: Narda Rutherford M.D.   On: 12/03/2018 01:54   [place] Ekg Site Rite  Result Date: 12/10/2018 If Site Rite image not attached, placement could not be confirmed due to current cardiac rhythm.   ECG & Cardiac Imaging    01/01/2019- personally reviewed.  Sinus rhythm, left bundle branch block.  No change from prior  Assessment & Plan    Mr. Waren is a 67 y.o. male with a history of CAD status post CABG x3, MR status post MVR on 11/30/2018 with complicated hospital course following this surgery including postoperative cardiogenic shock in setting of EF of 25% requiring inotrope therapy and Impella placement, acute blood loss anemia, who is being seen today for the evaluation of near syncope.  Thus far the patient has received gentle IV fluids and blood pressure has improved to the 120s systolic.  His laboratory  evaluation has been remarkable for a elevated creatinine up to 1.9 from 1.2 at discharge approximately 1 week ago.  Also with recurrent leukocytosis with a WBC 14.4, up from 9.0 at discharge.  High-sensitivity troponin on serial draws was 16, 15.  Remained stable from prior with left bundle branch block.  On examination the patient appears euvolemic with somewhat cool extremities.  The exact etiology of the patient's near syncopal event remains somewhat unclear but hypotension, orthostasis with elevated BUN/Cr suggest hypovolemia.  He was discharged on multiple new medications including Entresto, spironolactone, torsemide.  Cannot rule out arrhythmia at this time.  Ischemic etiology unlikely, no chest pain or other symptoms consistent with this.  -Please obtain echocardiogram in the morning -Agree with gentle hydration overnight -Hold home Entresto, spironolactone and torsemide at this time.  Will likely need dose adjustments. -Consider discussing sternal wound healing with CT surgery given erythema on  examination and leukocytosis on presentation today.  Thank you for this interesting consult, we will continue to follow.  Signed, Starlyn Skeans, MD 01/01/2019, 11:09 PM  For questions or updates, please contact   Please consult [email or web address] for contact info under Cardiology/STEMI.

## 2019-01-01 NOTE — ED Provider Notes (Signed)
[facility] EMERGENCY DEPARTMENT Provider Note   CSN: [id number] Arrival date & time: 01/01/19  1845     History   Chief Complaint Chief Complaint  Patient presents with   Hypotension    HPI Curtis [name] is a 68 y.o. male past with history of CAD, hyponatremia, CABG on 11/30/18 brought in by EMS for evaluation of generalized weakness, hypotension.  Patient reports that he was discharged from the hospital about 1 week ago.  He states since then, he has been feeling good and has not had any complaints.  He states that today, he walked up 13 stairs twice which he had not done before.  He states that on the second time, he felt generalized weakness, lightheadedness.  He states he did not have any associated chest pain, nausea/vomiting or diaphoresis.  He went inside where his home health nurse did his blood pressure and was noted to be hypotensive.  He was orthostatic positive prompting EMS call.  On EMS arrival, he had blood pressure of 84/62 and was given a 500 cc bolus of fluid which improved him to 91/60.  He states he did not have any focal weakness.  He denies any LOC.  Patient states he has not any recent fevers, chills, cough.  He has been able to eat and drink without any difficulty and denies any nausea/vomiting.  Denies any chest pain, difficulty breathing, abdominal pain, numbness/weakness of arms or legs.     The history is provided by the patient.    Past Medical History:  Diagnosis Date   Coronary artery disease    Elevated troponin 11/25/2018   Tobacco use     Patient Active Problem List   Diagnosis Date Noted   AKI (acute kidney injury) (HCC) 01/01/2019   Orthostatic hypotension 01/01/2019   Hyperglycemia 01/01/2019   Pain    Hyponatremia    Labile blood glucose    Elevated serum creatinine    Debility 12/11/2018   S/P MVR (mitral valve replacement)    Prediabetes    PAF (paroxysmal atrial fibrillation) (HCC)    S/P CABG x 3  11/30/2018   Coronary artery disease involving native heart without angina pectoris    Chronic combined systolic and diastolic CHF (congestive heart failure) (HCC)    Pulmonary edema 11/24/2018   Hypertensive emergency 11/24/2018   Leukocytosis 11/24/2018   NSTEMI (non-ST elevated myocardial infarction) (HCC)    Hypertension     Past Surgical History:  Procedure Laterality Date   CORONARY ARTERY BYPASS GRAFT N/A 11/30/2018   Procedure: MEDIASTINAL POST-OP BLEED;  Surgeon: Linden Dolin, MD;  Location: [facility] OR;  Service: Open Heart Surgery;  Laterality: N/A;   CORONARY ARTERY BYPASS GRAFT N/A 11/30/2018   Procedure: CORONARY ARTERY BYPASS GRAFTING (CABG) x Three , using left internal mammary artery and left leg greater saphenous vein harvested endoscopically;  Surgeon: Linden Dolin, MD;  Location: [facility] OR;  Service: Open Heart Surgery;  Laterality: N/A;   LEFT HEART CATH AND CORONARY ANGIOGRAPHY N/A 11/24/2018   Procedure: LEFT HEART CATH AND CORONARY ANGIOGRAPHY;  Surgeon: Lyn Records, MD;  Location: [facility] INVASIVE CV LAB;  Service: Cardiovascular;  Laterality: N/A;   MITRAL VALVE REPAIR N/A 11/30/2018   Procedure: MITRAL VALVE REPLACEMENT (MVR) USING MAGNA EASE SIZE 27mm;  Surgeon: Linden Dolin, MD;  Location: [facility] OR;  Service: Open Heart Surgery;  Laterality: N/A;   PLACEMENT OF IMPELLA LEFT VENTRICULAR ASSIST DEVICE N/A 11/30/2018   Procedure: PLACEMENT OF IMPELLA  LEFT VENTRICULAR ASSIST DEVICE;  Surgeon: Wonda Olds, MD;  Location: [place];  Service: Open Heart Surgery;  Laterality: N/A;   REMOVAL OF IMPELLA LEFT VENTRICULAR ASSIST DEVICE N/A 12/04/2018   Procedure: REMOVAL OF IMPELLA LEFT VENTRICULAR ASSIST DEVICE.  EVACUATION OF LEFT HEMOTHORAX;  Surgeon: Wonda Olds, MD;  Location: [place];  Service: Open Heart Surgery;  Laterality: N/A;   STERNAL INCISION RECLOSURE  12/04/2018   Procedure: Sternal Plating with Sternal Rewiring;  Surgeon: Wonda Olds, MD;   Location: [place] OR;  Service: Open Heart Surgery;;   TEE WITHOUT CARDIOVERSION N/A 11/26/2018   Procedure: TRANSESOPHAGEAL ECHOCARDIOGRAM (TEE);  Surgeon: Jerline Pain, MD;  Location: [facility] ENDOSCOPY;  Service: Cardiovascular;  Laterality: N/A;   TEE WITHOUT CARDIOVERSION N/A 11/30/2018   Procedure: TRANSESOPHAGEAL ECHOCARDIOGRAM (TEE);  Surgeon: Wonda Olds, MD;  Location: [place];  Service: Open Heart Surgery;  Laterality: N/A;   TEE WITHOUT CARDIOVERSION N/A 12/04/2018   Procedure: TRANSESOPHAGEAL ECHOCARDIOGRAM (TEE);  Surgeon: Wonda Olds, MD;  Location: [place];  Service: Open Heart Surgery;  Laterality: N/A;        Home Medications    Prior to Admission medications   Medication Sig Start Date End Date Taking? Authorizing Provider  amiodarone (PACERONE) 200 MG tablet Take 1 tablet (200 mg total) by mouth 2 (two) times daily. For 5 days then take Amiodarone 200 mg daily thereafter Patient taking differently: Take 200 mg by mouth daily.  12/23/18  Yes Angiulli, Lavon Paganini, PA-C  aspirin EC 325 MG EC tablet Take 1 tablet (325 mg total) by mouth daily. 12/12/18  Yes Lars Pinks M, PA-C  guaiFENesin (MUCINEX) 600 MG 12 hr tablet Take 1 tablet (600 mg total) by mouth 2 (two) times daily. 12/23/18  Yes Angiulli, Lavon Paganini, PA-C  Multiple Vitamin (MULTIVITAMIN WITH MINERALS) TABS tablet Take 1 tablet by mouth daily. 12/12/18  Yes Lars Pinks M, PA-C  pantoprazole (PROTONIX) 40 MG tablet Take 1 tablet (40 mg total) by mouth daily. 12/24/18  Yes Angiulli, Lavon Paganini, PA-C  rosuvastatin (CRESTOR) 40 MG tablet Take 1 tablet (40 mg total) by mouth daily at 6 PM. 12/23/18  Yes Angiulli, Lavon Paganini, PA-C  sacubitril-valsartan (ENTRESTO) 24-26 MG Take 1 tablet by mouth 2 (two) times daily. 12/23/18  Yes Angiulli, Lavon Paganini, PA-C  spironolactone (ALDACTONE) 25 MG tablet Take 1 tablet (25 mg total) by mouth daily. 12/23/18  Yes Angiulli, Lavon Paganini, PA-C  torsemide (DEMADEX) 10 MG tablet Take  1 tablet (10 mg total) by mouth daily. 12/24/18  Yes Angiulli, Lavon Paganini, PA-C    Family History Family History  Problem Relation Age of Onset   Heart attack Father    Heart attack Mother     Social History Social History   Tobacco Use   Smoking status: Current Some Day Smoker    Packs/day: 0.25    Types: Cigarettes   Smokeless tobacco: Never Used  Substance Use Topics   Alcohol use: Never    Frequency: Never   Drug use: Never     Allergies   Penicillins   Review of Systems Review of Systems  Constitutional: Negative for fever.  Respiratory: Negative for cough and shortness of breath.   Cardiovascular: Negative for chest pain.  Gastrointestinal: Negative for abdominal pain, nausea and vomiting.  Genitourinary: Negative for dysuria and hematuria.  Neurological: Positive for weakness (generalized) and light-headedness. Negative for syncope and headaches.  All other systems reviewed and are negative.  Physical Exam Updated Vital Signs BP 126/67    Pulse 76    Temp 97.8 F (36.6 C) (Oral)    Resp 17    Ht 5\' 9"  (1.753 m)    Wt 81.6 kg    SpO2 98%    BMI 26.58 kg/m   Physical Exam Vitals signs and nursing note reviewed.  Constitutional:      Appearance: Normal appearance. He is well-developed.  HENT:     Head: Normocephalic and atraumatic.  Eyes:     General: Lids are normal.     Conjunctiva/sclera: Conjunctivae normal.     Pupils: Pupils are equal, round, and reactive to light.  Neck:     Musculoskeletal: Full passive range of motion without pain.  Cardiovascular:     Rate and Rhythm: Normal rate and regular rhythm.     Pulses: Normal pulses.          Radial pulses are 2+ on the right side and 2+ on the left side.       Dorsalis pedis pulses are 2+ on the right side and 2+ on the left side.     Heart sounds: Normal heart sounds. No murmur. No friction rub. No gallop.   Pulmonary:     Effort: Pulmonary effort is normal.     Breath sounds: Normal  breath sounds.     Comments: Lungs clear to auscultation bilaterally.  Symmetric chest rise.  No wheezing, rales, rhonchi. Chest:     Comments: Healing sternotomy scar noted to chest.  No surrounding warmth, erythema. Abdominal:     Palpations: Abdomen is soft. Abdomen is not rigid.     Tenderness: There is no abdominal tenderness. There is no guarding.     Comments: Abdomen is soft, non-distended, non-tender. No rigidity, No guarding. No peritoneal signs.  Musculoskeletal: Normal range of motion.  Skin:    General: Skin is warm and dry.     Capillary Refill: Capillary refill takes less than 2 seconds.  Neurological:     Mental Status: He is alert and oriented to person, place, and time.     Comments: Cranial nerves III-XII intact Follows commands, Moves all extremities  5/5 strength to BUE and BLE  Sensation intact throughout all major nerve distributions No slurred speech. No facial droop.   Psychiatric:        Speech: Speech normal.      ED Treatments / Results  Labs (all labs ordered are listed, but only abnormal results are displayed) Labs Reviewed  COMPREHENSIVE METABOLIC PANEL - Abnormal; Notable for the following components:      Result Value   Sodium 133 (*)    Potassium 3.4 (*)    Chloride 94 (*)    Glucose, Bld 238 (*)    BUN 32 (*)    Creatinine, Ser 1.87 (*)    Calcium 8.4 (*)    Albumin 3.1 (*)    GFR calc non Af Amer 37 (*)    GFR calc Af Amer 42 (*)    All other components within normal limits  CBC WITH DIFFERENTIAL/PLATELET - Abnormal; Notable for the following components:   WBC 14.4 (*)    RBC 4.06 (*)    Hemoglobin 12.2 (*)    RDW 17.1 (*)    Neutro Abs 12.3 (*)    Abs Immature Granulocytes 0.16 (*)    All other components within normal limits  SARS CORONAVIRUS 2 (TAT 6-24 HRS)  URINALYSIS, ROUTINE W REFLEX MICROSCOPIC  SODIUM, URINE,  RANDOM  UREA NITROGEN, URINE  CREATININE, URINE, RANDOM  TROPONIN I (HIGH SENSITIVITY)  TROPONIN I (HIGH  SENSITIVITY)    EKG EKG Interpretation  Date/Time:  Friday January 01 2019 18:51:13 EDT Ventricular Rate:  82 PR Interval:    QRS Duration: 168 QT Interval:  453 QTC Calculation: 530 R Axis:   109 Text Interpretation: Sinus rhythm Nonspecific intraventricular conduction delay Repol abnrm suggests ischemia, lateral leads LBBB, similar to 12/06/2018, does not meet [place] STEMI criteria Confirmed by Alvester Chou [phone]) on 01/01/2019 7:02:31 PM   Radiology No results found.  Procedures Procedures (including critical care time)  Medications Ordered in ED Medications  amiodarone (PACERONE) tablet 200 mg (has no administration in time range)  acetaminophen (TYLENOL) tablet 650 mg (has no administration in time range)    Or  acetaminophen (TYLENOL) suppository 650 mg (has no administration in time range)  HYDROcodone-acetaminophen (NORCO/VICODIN) 5-325 MG per tablet 1-2 tablet (has no administration in time range)  ondansetron (ZOFRAN) tablet 4 mg (has no administration in time range)    Or  ondansetron (ZOFRAN) injection 4 mg (has no administration in time range)  potassium chloride SA (KLOR-CON) CR tablet 40 mEq (has no administration in time range)  0.9 %  sodium chloride infusion (has no administration in time range)  sodium chloride 0.9 % bolus 1,000 mL (0 mLs Intravenous Stopped 01/01/19 2210)     Initial Impression / Assessment and Plan / ED Course  I have reviewed the triage vital signs and the nursing notes.  Pertinent labs & imaging results that were available during my care of the patient were reviewed by me and considered in my medical decision making (see chart for details).  Clinical Course as of Dec 31 2353  Fri Jan 01, 2019  2235 Patient was seen by myself as well as PA provider.  Briefly this 68 year old male with a history of recent hospitalization for MI status post CABG in Sept 2020, which was complicated by acute blood loss anemia and heart failure  requiring Impella device and prolonged ICU stay, presenting back to the emergency department with shortness of breath and near syncope.  The patient reports that he was at home and attempting to climb steps.  Refill think he was getting lightheaded near the top of the stairs.  He states his home health aide told him that he looked pale and that he look like he was going to pass out.  There is no loss of consciousness.  The patient continues to report that he feels "fine" in the emergency department.  On my exam the patient appears tired.  He is pale.  His vital signs are within normal limits.  He does have a cardiac murmur some mild crackles in the lung base.  His labs are notable for mild increase in his creatinine from his baseline with an elevated BUN.  His troponins are negative on recheck.  Given his degree of critical heart failure in the very recent past, as well as recent surgery, will reach out to cardiology to determine next best steps.  Anticipate he will likely need admission for echocardiogram.   [MT]    Clinical Course User Index [MT] Trifan, Kermit Balo, MD       68 year old male with past medical history of CABG who presents for evaluation of generalized weakness, hypotension.  Reports that after walking upstairs today, he became lightheaded, felt very weak.  Home health nurse noted him to be hypotensive and called EMS.  No chest  pain, difficulty breathing.  Initially at arrival, he is afebrile.  He looks uncomfortable but no acute distress.  Blood pressure is soft but stable.  Vitals otherwise stable.  He has equal pulses in all 4 extremities.  He has a well-healing surgical incision scar noted to anterior chest wall.  Concern for infectious process versus ACS etiology versus dehydration.  Plan to check labs, EKG, troponin.  We will plan to give 1 L of fluid but given history of CHF, will hold off on any aggressive fluid resuscitation.  History of CABG with 60-70% proximal LAD lesions.  He  also had mitral valve replacement on 11/30/18.  He had postoperative cardiogenic shock with mediastinal bleeding and was returned to the OR for LVAD and evacuation of hemothorax.  He was extubated on 12/05/2018.  CBC shows leukocytosis of 14.4.  He had had leukocytosis after his postop but then it had improved on discharge.  CMP shows BUN and creatinine are slightly elevated at 32 and 1.87.  Initial Theodis Aguasrope is negative.  Discussed patient with Dr. Drinda ButtsLowenstern (Cardiology).  She will come down to evaluate patient but feels that patient needs admitted for further work-up as to this exertional near syncope.  She recommends medical admission.  Discussed patient with Dr. Antionette Charpyd (hospitalist). He accepts patient for admission.   Portions of this note were generated with Scientist, clinical (histocompatibility and immunogenetics)Dragon dictation software. Dictation errors may occur despite best attempts at proofreading.   Final Clinical Impressions(s) / ED Diagnoses   Final diagnoses:  Near syncope  Acute kidney injury [facility](HCC)    ED Discharge Orders    None       Maxwell CaulLayden, Aryianna Earwood A, PA-C 01/01/19 2355    Terald Sleeperrifan, Matthew J, MD 01/02/19 [phone]

## 2019-01-02 ENCOUNTER — Observation Stay (HOSPITAL_BASED_OUTPATIENT_CLINIC_OR_DEPARTMENT_OTHER): Payer: Medicare Other

## 2019-01-02 ENCOUNTER — Observation Stay (HOSPITAL_COMMUNITY): Payer: Medicare Other

## 2019-01-02 DIAGNOSIS — Z7982 Long term (current) use of aspirin: Secondary | ICD-10-CM | POA: Diagnosis not present

## 2019-01-02 DIAGNOSIS — R55 Syncope and collapse: Principal | ICD-10-CM | POA: Insufficient documentation

## 2019-01-02 DIAGNOSIS — Z951 Presence of aortocoronary bypass graft: Secondary | ICD-10-CM

## 2019-01-02 DIAGNOSIS — Z952 Presence of prosthetic heart valve: Secondary | ICD-10-CM

## 2019-01-02 DIAGNOSIS — R739 Hyperglycemia, unspecified: Secondary | ICD-10-CM | POA: Diagnosis present

## 2019-01-02 DIAGNOSIS — Z20828 Contact with and (suspected) exposure to other viral communicable diseases: Secondary | ICD-10-CM | POA: Diagnosis present

## 2019-01-02 DIAGNOSIS — Z880 Allergy status to penicillin: Secondary | ICD-10-CM | POA: Diagnosis not present

## 2019-01-02 DIAGNOSIS — I951 Orthostatic hypotension: Secondary | ICD-10-CM

## 2019-01-02 DIAGNOSIS — I2510 Atherosclerotic heart disease of native coronary artery without angina pectoris: Secondary | ICD-10-CM | POA: Diagnosis present

## 2019-01-02 DIAGNOSIS — F17210 Nicotine dependence, cigarettes, uncomplicated: Secondary | ICD-10-CM | POA: Diagnosis present

## 2019-01-02 DIAGNOSIS — I952 Hypotension due to drugs: Secondary | ICD-10-CM | POA: Diagnosis present

## 2019-01-02 DIAGNOSIS — T46995A Adverse effect of other agents primarily affecting the cardiovascular system, initial encounter: Secondary | ICD-10-CM | POA: Diagnosis present

## 2019-01-02 DIAGNOSIS — N179 Acute kidney failure, unspecified: Secondary | ICD-10-CM | POA: Diagnosis present

## 2019-01-02 DIAGNOSIS — I5042 Chronic combined systolic (congestive) and diastolic (congestive) heart failure: Secondary | ICD-10-CM | POA: Diagnosis present

## 2019-01-02 DIAGNOSIS — I480 Paroxysmal atrial fibrillation: Secondary | ICD-10-CM | POA: Diagnosis present

## 2019-01-02 DIAGNOSIS — D72829 Elevated white blood cell count, unspecified: Secondary | ICD-10-CM | POA: Diagnosis present

## 2019-01-02 DIAGNOSIS — Z8249 Family history of ischemic heart disease and other diseases of the circulatory system: Secondary | ICD-10-CM | POA: Diagnosis not present

## 2019-01-02 LAB — BASIC METABOLIC PANEL
Anion gap: 10 (ref 5–15)
BUN: 27 mg/dL — ABNORMAL HIGH (ref 8–23)
CO2: 25 mmol/L (ref 22–32)
Calcium: 8.3 mg/dL — ABNORMAL LOW (ref 8.9–10.3)
Chloride: 100 mmol/L (ref 98–111)
Creatinine, Ser: 1.66 mg/dL — ABNORMAL HIGH (ref 0.61–1.24)
GFR calc Af Amer: 49 mL/min — ABNORMAL LOW (ref >60)
GFR calc non Af Amer: 42 mL/min — ABNORMAL LOW (ref >60)
Glucose, Bld: 116 mg/dL — ABNORMAL HIGH (ref 70–99)
Potassium: 4.1 mmol/L (ref 3.5–5.1)
Sodium: 135 mmol/L (ref 135–145)

## 2019-01-02 LAB — CBC WITH DIFFERENTIAL/PLATELET
Abs Immature Granulocytes: 0.11 10*3/uL — ABNORMAL HIGH (ref 0.00–0.07)
Basophils Absolute: 0.1 10*3/uL (ref 0.0–0.1)
Basophils Relative: 1 %
Eosinophils Absolute: 0.3 10*3/uL (ref 0.0–0.5)
Eosinophils Relative: 3 %
HCT: 35 % — ABNORMAL LOW (ref 39.0–52.0)
Hemoglobin: 11.3 g/dL — ABNORMAL LOW (ref 13.0–17.0)
Immature Granulocytes: 1 %
Lymphocytes Relative: 16 %
Lymphs Abs: 1.8 10*3/uL (ref 0.7–4.0)
MCH: 30.6 pg (ref 26.0–34.0)
MCHC: 32.3 g/dL (ref 30.0–36.0)
MCV: 94.9 fL (ref 80.0–100.0)
Monocytes Absolute: 1 10*3/uL (ref 0.1–1.0)
Monocytes Relative: 8 %
Neutro Abs: 8.3 10*3/uL — ABNORMAL HIGH (ref 1.7–7.7)
Neutrophils Relative %: 71 %
Platelets: 213 10*3/uL (ref 150–400)
RBC: 3.69 MIL/uL — ABNORMAL LOW (ref 4.22–5.81)
RDW: 17.2 % — ABNORMAL HIGH (ref 11.5–15.5)
WBC: 11.6 10*3/uL — ABNORMAL HIGH (ref 4.0–10.5)
nRBC: 0 % (ref 0.0–0.2)

## 2019-01-02 LAB — URINALYSIS, ROUTINE W REFLEX MICROSCOPIC
Bilirubin Urine: NEGATIVE
Glucose, UA: NEGATIVE mg/dL
Ketones, ur: NEGATIVE mg/dL
Leukocytes,Ua: NEGATIVE
Nitrite: NEGATIVE
Protein, ur: 30 mg/dL — AB
Specific Gravity, Urine: 1.016 (ref 1.005–1.030)
pH: 5 (ref 5.0–8.0)

## 2019-01-02 LAB — SARS CORONAVIRUS 2 (TAT 6-24 HRS): SARS Coronavirus 2: NEGATIVE

## 2019-01-02 LAB — HEMOGLOBIN A1C
Hgb A1c MFr Bld: 5.5 % (ref 4.8–5.6)
Mean Plasma Glucose: 111.15 mg/dL

## 2019-01-02 LAB — SODIUM, URINE, RANDOM: Sodium, Ur: 30 mmol/L

## 2019-01-02 LAB — ECHOCARDIOGRAM COMPLETE
Height: 69 in
Weight: 2728 oz

## 2019-01-02 LAB — GLUCOSE, CAPILLARY
Glucose-Capillary: 114 mg/dL — ABNORMAL HIGH (ref 70–99)
Glucose-Capillary: 141 mg/dL — ABNORMAL HIGH (ref 70–99)
Glucose-Capillary: 105 mg/dL — ABNORMAL HIGH (ref 70–99)
Glucose-Capillary: 148 mg/dL — ABNORMAL HIGH (ref 70–99)

## 2019-01-02 LAB — MAGNESIUM: Magnesium: 1.7 mg/dL (ref 1.7–2.4)

## 2019-01-02 LAB — CREATININE, URINE, RANDOM: Creatinine, Urine: 138.98 mg/dL

## 2019-01-02 MED ORDER — SODIUM CHLORIDE 0.9% FLUSH
3.0000 mL | Freq: Two times a day (BID) | INTRAVENOUS | Status: DC
  Administered 2019-01-02 – 2019-01-03 (×3): 3 mL via INTRAVENOUS

## 2019-01-02 MED ORDER — POLYETHYLENE GLYCOL 3350 17 G PO PACK: 17.0000 g | PACK | Freq: Every day | ORAL | Status: DC | PRN

## 2019-01-02 MED ORDER — GUAIFENESIN ER 600 MG PO TB12: 600.0000 mg | ORAL_TABLET | Freq: Two times a day (BID) | ORAL | Status: DC | PRN

## 2019-01-02 MED ORDER — SODIUM CHLORIDE 0.9% FLUSH: 3.0000 mL | INTRAVENOUS | Status: DC | PRN

## 2019-01-02 MED ORDER — INSULIN ASPART 100 UNIT/ML ~~LOC~~ SOLN
0.0000 [IU] | Freq: Three times a day (TID) | SUBCUTANEOUS | Status: DC
  Administered 2019-01-02: 13:00:00 1 [IU] via SUBCUTANEOUS
  Administered 2019-01-03: 2 [IU] via SUBCUTANEOUS
  Administered 2019-01-03: 1 [IU] via SUBCUTANEOUS

## 2019-01-02 MED ORDER — INSULIN ASPART 100 UNIT/ML ~~LOC~~ SOLN: 0.0000 [IU] | Freq: Every day | SUBCUTANEOUS | Status: DC

## 2019-01-02 MED ORDER — ROSUVASTATIN CALCIUM 20 MG PO TABS
40.0000 mg | ORAL_TABLET | Freq: Every day | ORAL | Status: DC
  Administered 2019-01-02 – 2019-01-04 (×3): 40 mg via ORAL
  Filled 2019-01-02 (×3): qty 2

## 2019-01-02 MED ORDER — ASPIRIN EC 325 MG PO TBEC
325.0000 mg | DELAYED_RELEASE_TABLET | Freq: Every day | ORAL | Status: DC
  Administered 2019-01-02 – 2019-01-04 (×3): 325 mg via ORAL
  Filled 2019-01-02 (×3): qty 1

## 2019-01-02 MED ORDER — SODIUM CHLORIDE 0.9 % IV SOLN: 250.0000 mL | INTRAVENOUS | Status: DC | PRN

## 2019-01-02 MED ORDER — SODIUM CHLORIDE 0.9% FLUSH
3.0000 mL | Freq: Two times a day (BID) | INTRAVENOUS | Status: DC
  Administered 2019-01-04: 3 mL via INTRAVENOUS

## 2019-01-02 MED ORDER — PANTOPRAZOLE SODIUM 40 MG PO TBEC
40.0000 mg | DELAYED_RELEASE_TABLET | Freq: Every day | ORAL | Status: DC
  Administered 2019-01-02 – 2019-01-04 (×3): 40 mg via ORAL
  Filled 2019-01-02 (×3): qty 1

## 2019-01-02 MED ORDER — HEPARIN SODIUM (PORCINE) 5000 UNIT/ML IJ SOLN
5000.0000 [IU] | Freq: Three times a day (TID) | INTRAMUSCULAR | Status: DC
  Administered 2019-01-02 – 2019-01-04 (×6): 5000 [IU] via SUBCUTANEOUS
  Filled 2019-01-02 (×6): qty 1

## 2019-01-02 NOTE — ED Notes (Signed)
Admitted MD at the bedside. 

## 2019-01-02 NOTE — Progress Notes (Signed)
Progress Note  Patient Name: Curtis [name] Date of Encounter: 01/02/2019  Primary Cardiologist: Chrystie Nose, MD   Subjective   Mr. Winer is a 68 year old gentleman with a history of coronary artery disease-status post CABG.  He had a mitral valve replacement on November 30, 2018.  He had a complicated hospital course including postoperative cardiogenic shock with an ejection fraction of 25%.  He required inotropes and Impella.  He had acute blood loss anemia. He had some generalized fatigue and had an episode of presyncope yesterday.  We are were asked to see him today for further evaluation.  He was given a liter of normal saline in the emergency room.  Was noted that his creatinine was up to 1.87 which is increased from 1.2 a week earlier.  White blood cell count was elevated at 14.4 K.  Diuretics and Entresto have been held. Still getting some IV fluids.  He is feeling a little bit better.  He had a similar episode of leg weakness and lightheadedness last week. Inpatient Medications    Scheduled Meds: . amiodarone  200 mg Oral Daily  . aspirin  325 mg Oral Daily  . heparin  5,000 Units Subcutaneous Q8H  . insulin aspart  0-5 Units Subcutaneous QHS  . insulin aspart  0-9 Units Subcutaneous TID WC  . pantoprazole  40 mg Oral Daily  . rosuvastatin  40 mg Oral q1800  . sodium chloride flush  3 mL Intravenous Q12H  . sodium chloride flush  3 mL Intravenous Q12H   Continuous Infusions: . sodium chloride     PRN Meds: sodium chloride, acetaminophen **OR** acetaminophen, guaiFENesin, HYDROcodone-acetaminophen, ondansetron **OR** ondansetron (ZOFRAN) IV, polyethylene glycol, sodium chloride flush   Vital Signs    Vitals:   01/02/19 0130 01/02/19 0155 01/02/19 0159 01/02/19 0833  BP: 119/60  138/71 (!) 115/51  Pulse: 74  79 82  Resp: 15  19 18   Temp:   97.7 F (36.5 C) 98.1 F (36.7 C)  TempSrc:   Oral Oral  SpO2: 96%  100%   Weight:  77.3 kg    Height:  5\' 9"  (1.753 m)       Intake/Output Summary (Last 24 hours) at 01/02/2019 1043 Last data filed at 01/02/2019 0900 Gross per 24 hour  Intake 341.09 ml  Output 225 ml  Net 116.09 ml   Last 3 Weights 01/02/2019 01/01/2019 12/25/2018  Weight (lbs) 170 lb 8 oz 180 lb 170 lb 3.1 oz  Weight (kg) 77.338 kg 81.647 kg 77.2 kg      Telemetry    Normal sinus rhythm- Personally Reviewed  ECG     - Personally Reviewed  Physical Exam   GEN:  Middle-aged gentleman, no acute distress Neck: No JVD Cardiac: RRR, no murmurs, rubs, or gallops.   Sternotomy scar is healing fairly well.  He has several scabs and some mild erythema.  It does not appear to be infected.  The thoracostomy sites also have some escar Respiratory: Clear to auscultation bilaterally. GI: Soft, nontender, non-distended  MS: No edema; No deformity. Neuro:  Nonfocal  Psych: Normal affect   Labs    High Sensitivity Troponin:   Recent Labs  Lab 01/01/19 1853 01/01/19 2104  TROPONINIHS 16 15      Chemistry Recent Labs  Lab 01/01/19 1853 01/02/19 0420  NA 133* 135  K 3.4* 4.1  CL 94* 100  CO2 25 25  GLUCOSE 238* 116*  BUN 32* 27*  CREATININE 1.87* 1.66*  CALCIUM 8.4* 8.3*  PROT 6.7  --   ALBUMIN 3.1*  --   AST 20  --   ALT 25  --   ALKPHOS 93  --   BILITOT 1.0  --   GFRNONAA 37* 42*  GFRAA 42* 49*  ANIONGAP 14 10     Hematology Recent Labs  Lab 01/01/19 1853 01/02/19 0420  WBC 14.4* 11.6*  RBC 4.06* 3.69*  HGB 12.2* 11.3*  HCT 39.1 35.0*  MCV 96.3 94.9  MCH 30.0 30.6  MCHC 31.2 32.3  RDW 17.1* 17.2*  PLT 237 213    BNPNo results for input(s): BNP, PROBNP in the last 168 hours.   DDimer No results for input(s): DDIMER in the last 168 hours.   Radiology    Dg Chest Port 1 View  Result Date: 01/02/2019 CLINICAL DATA:  68 year old male with recent history of CABG presenting with weakness. EXAM: PORTABLE CHEST 1 VIEW COMPARISON:  Chest radiograph dated 12/11/2018 FINDINGS: There is no focal  consolidation, pleural effusion, or pneumothorax. There is stable cardiomegaly. Median sternotomy wires and mechanical heart valve noted. No acute osseous pathology. IMPRESSION: 1. No acute cardiopulmonary process. 2. Stable cardiomegaly. Electronically Signed   By: Anner Crete M.D.   On: 01/02/2019 00:13    Cardiac Studies      Patient Profile     68 y.o. male status post mitral valve replacement.  He had acute LV failure following his surgery.  He is admitted with generalized weakness and presyncope related to hypotension.  Assessment & Plan    1.  Leg weakness and near syncope: This is most likely due to hypotension which is related to hypovolemia.  He was on Entresto and torsemide.  We have found that Entresto plus diuretics often leads to episodes of hypotension.  Will restart the Entresto-probably tomorrow.  I would anticipate holding the torsemide for now and may be considering giving torsemide every second or third day if he has fluid  Retention.  2.  Acute on chronic systolic congestive heart failure: His ejection fraction was 25 to 30% by echo on September 24.  He is scheduled for repeat echocardiogram today. I anticipate restarting his Entresto tomorrow.  We will not restart torsemide unless he shows signs of volume excess.  I suspect that the combination of Entresto plus torsemide contributed to his episodes of hypotension and leg weakness.  restart spironolactone in 1-2 days   3.  CAD - no angina at present          For questions or updates, please contact [place] Please consult [email or web address] for contact info under        Signed, Mertie Moores, MD  01/02/2019, 10:43 AM

## 2019-01-02 NOTE — Progress Notes (Signed)
  Echocardiogram 2D Echocardiogram has been performed.  Curtis [name] 01/02/2019, 12:06 PM

## 2019-01-02 NOTE — Progress Notes (Signed)
PROGRESS NOTE    Curtis [name]  NAT:[id number] DOB: [birth year] DOA: 01/01/2019 PCP: Patient, No Pcp Per   Brief Narrative:  Curtis [name] is a 68 y.o. male with medical history significant for coronary artery disease status post CABG on 11/30/2018, EF 25 to 30%, status post mitral valve replacement who presented to ED on 01/01/2019 for evaluation of generalized weakness, near syncope, and low blood pressure.  Patient was discharged from inpatient rehab on 12/25/2018, reports that he is doing okay back at home, but has had some generalized weakness and fatigue, and became acutely weak in general and felt as though he might pass out while walking up stairs today.  There was no chest pain associated with this and no palpitations.  He did not actually lose consciousness or fall.  He was reported to be diaphoretic at this time.  EMS reports orthostatic hypotension with systolic blood pressure 84 on standing.  He was given 500 cc normal saline with EMS prior to arrival in the ED.  The patient denies any recent fevers or chills, denies any cough, and denies shortness of breath.  He denies leg swelling or tenderness. Upon arrival to the ED, patient is found to be afebrile, saturating well on room air, and with blood pressure 111/63.  EKG features a sinus rhythm with nonspecific IVCD and repolarization abnormality.  Chemistry panel is notable for glucose of 238, potassium 3.4, and creatinine 1.87, up from 1.2 a week earlier.  CBC features a leukocytosis to 14,400.  High-sensitivity troponin is normal x2.  Patient was given a liter of normal saline in the emergency department and admitted to hospital service with cardiology to consult.  Assessment & Plan:   Principal Problem:   AKI (acute kidney injury) ([place]) Active Problems:   Chronic combined systolic and diastolic CHF (congestive heart failure) (HCC)   S/P CABG x 3   S/P MVR (mitral valve replacement)   PAF (paroxysmal atrial fibrillation) (HCC)   Orthostatic  hypotension   Hyperglycemia   1. Orthostatic hypotension; near-syncope  - Feels better than yesterday.  Seen by cardiology and his Entresto and diuretics were held.  No orthostatics available in the chart to review.  Blood pressure overall seems to be better.   2. Acute kidney injury  - SCr is 1.87 on admission, up from an apparent baseline of 1-1.1  - Likely prerenal azotemia given low BP with concomitant angiotensin II blockade  - He was given 500 cc IVF with EMS and 1 liter NS in ED. he was started on gentle hydration.  Creatinine improved only a little bit at 1.66 today.  Will repeat tomorrow.  3. CAD  - No anginal complaints  - Continue ASA and statin    4. Chronic systolic CHF  - Appears compensated   - Holding Entresto.  Per cardiology note today, the plan on restarting tomorrow.  Repeat echo pending.  5. PAF  - Patient developed PAF during hospitalization last month and was started on amiodarone.  Now in sinus rhythm.  Continue amiodarone.  He is not on any anticoagulation.  Will defer to cardiology.  6. Hyperglycemia  - Serum glucose is 238 in ED  see within normal limits.  Blood sugar better since then.  Will discontinue SSI.   DVT prophylaxis: Heparin subcu Code Status: Full code Family Communication:  None present at bedside.  Plan of care discussed with patient in length and he verbalized understanding and agreed with it. Disposition Plan: Potentially discharge tomorrow as cardiology plans  to resume his Entresto tomorrow and echo is pending as well.  Estimated body mass index is 25.18 kg/m as calculated from the following:   Height as of this encounter: 5\' 9"  (1.753 m).   Weight as of this encounter: 77.3 kg.      Nutritional status:               Consultants:   Cardiology  Procedures:   None  Antimicrobials:   None   Subjective: Seen and examined.  Feels better.  No dizziness or any other complaint.  Objective: Vitals:    01/02/19 0130 01/02/19 0155 01/02/19 0159 01/02/19 0833  BP: 119/60  138/71 (!) 115/51  Pulse: 74  79 82  Resp: 15  19 18   Temp:   97.7 F (36.5 C) 98.1 F (36.7 C)  TempSrc:   Oral Oral  SpO2: 96%  100%   Weight:  77.3 kg    Height:  5\' 9"  (1.753 m)      Intake/Output Summary (Last 24 hours) at 01/02/2019 1134 Last data filed at 01/02/2019 0900 Gross per 24 hour  Intake 341.09 ml  Output 225 ml  Net 116.09 ml   Filed Weights   01/01/19 1848 01/02/19 0155  Weight: 81.6 kg 77.3 kg    Examination:  General exam: Appears calm and comfortable  Respiratory system: Clear to auscultation. Respiratory effort normal. Cardiovascular system: S1 & S2 heard, RRR. No JVD, murmurs, rubs, gallops or clicks. No pedal edema. Gastrointestinal system: Abdomen is nondistended, soft and nontender. No organomegaly or masses felt. Normal bowel sounds heard. Central nervous system: Alert and oriented. No focal neurological deficits. Extremities: Symmetric 5 x 5 power. Skin: No rashes, lesions or ulcers Psychiatry: Judgement and insight appear normal. Mood & affect appropriate.    Data Reviewed: I have personally reviewed following labs and imaging studies  CBC: Recent Labs  Lab 01/01/19 1853 01/02/19 0420  WBC 14.4* 11.6*  NEUTROABS 12.3* 8.3*  HGB 12.2* 11.3*  HCT 39.1 35.0*  MCV 96.3 94.9  PLT 237 213   Basic Metabolic Panel: Recent Labs  Lab 01/01/19 1853 01/02/19 0420  NA 133* 135  K 3.4* 4.1  CL 94* 100  CO2 25 25  GLUCOSE 238* 116*  BUN 32* 27*  CREATININE 1.87* 1.66*  CALCIUM 8.4* 8.3*  MG  --  1.7   GFR: Estimated Creatinine Clearance: 43.8 mL/min (A) (by C-G formula based on SCr of 1.66 mg/dL (H)). Liver Function Tests: Recent Labs  Lab 01/01/19 1853  AST 20  ALT 25  ALKPHOS 93  BILITOT 1.0  PROT 6.7  ALBUMIN 3.1*   No results for input(s): LIPASE, AMYLASE in the last 168 hours. No results for input(s): AMMONIA in the last 168 hours. Coagulation  Profile: No results for input(s): INR, PROTIME in the last 168 hours. Cardiac Enzymes: No results for input(s): CKTOTAL, CKMB, CKMBINDEX, TROPONINI in the last 168 hours. BNP (last 3 results) No results for input(s): PROBNP in the last 8760 hours. HbA1C: Recent Labs    01/02/19 0420  HGBA1C 5.5   CBG: Recent Labs  Lab 01/02/19 0747  GLUCAP 114*   Lipid Profile: No results for input(s): CHOL, HDL, LDLCALC, TRIG, CHOLHDL, LDLDIRECT in the last 72 hours. Thyroid Function Tests: No results for input(s): TSH, T4TOTAL, FREET4, T3FREE, THYROIDAB in the last 72 hours. Anemia Panel: No results for input(s): VITAMINB12, FOLATE, FERRITIN, TIBC, IRON, RETICCTPCT in the last 72 hours. Sepsis Labs: No results for input(s): PROCALCITON, LATICACIDVEN in the  last 168 hours.  Recent Results (from the past 240 hour(s))  SARS CORONAVIRUS 2 (TAT 6-24 HRS) Nasopharyngeal Nasopharyngeal Swab     Status: None   Collection Time: 01/01/19 10:02 PM   Specimen: Nasopharyngeal Swab  Result Value Ref Range Status   SARS Coronavirus 2 NEGATIVE NEGATIVE Final    Comment: (NOTE) SARS-CoV-2 target nucleic acids are NOT DETECTED. The SARS-CoV-2 RNA is generally detectable in upper and lower respiratory specimens during the acute phase of infection. Negative results do not preclude SARS-CoV-2 infection, do not rule out co-infections with other pathogens, and should not be used as the sole basis for treatment or other patient management decisions. Negative results must be combined with clinical observations, patient history, and epidemiological information. The expected result is Negative. Fact Sheet for Patients: HairSlick.nohttps://www.fda.gov/media/138098/download Fact Sheet for Healthcare Providers: quierodirigir.comhttps://www.fda.gov/media/138095/download This test is not yet approved or cleared by the [place] FDA and  has been authorized for detection and/or diagnosis of SARS-CoV-2 by FDA under an Emergency Use  Authorization (EUA). This EUA will remain  in effect (meaning this test can be used) for the duration of the COVID-19 declaration under Section 56 4(b)(1) of the Act, 21 U.S.C. section 360bbb-3(b)(1), unless the authorization is terminated or revoked sooner. Performed at [facility], [address] [place], [place] [address]       Radiology Studies: Dg Chest [place] View  Result Date: 01/02/2019 CLINICAL DATA:  68 year old male with recent history of CABG presenting with weakness. EXAM: PORTABLE CHEST 1 VIEW COMPARISON:  Chest radiograph dated 12/11/2018 FINDINGS: There is no focal consolidation, pleural effusion, or pneumothorax. There is stable cardiomegaly. Median sternotomy wires and mechanical heart valve noted. No acute osseous pathology. IMPRESSION: 1. No acute cardiopulmonary process. 2. Stable cardiomegaly. Electronically Signed   By: Elgie CollardArash  Radparvar M.D.   On: 01/02/2019 00:13    Scheduled Meds: . amiodarone  200 mg Oral Daily  . aspirin  325 mg Oral Daily  . heparin  5,000 Units Subcutaneous Q8H  . insulin aspart  0-5 Units Subcutaneous QHS  . insulin aspart  0-9 Units Subcutaneous TID WC  . pantoprazole  40 mg Oral Daily  . rosuvastatin  40 mg Oral q1800  . sodium chloride flush  3 mL Intravenous Q12H  . sodium chloride flush  3 mL Intravenous Q12H   Continuous Infusions: . sodium chloride       LOS: 0 days   Time spent: 32 minutes   Hughie Clossavi Victorine Mcnee, MD [facility]  01/02/2019, 11:34 AM   To contact the attending provider between 7A-7P or the covering provider during after hours 7P-7A, please log into the web site [email or web address] and use password [email or web address].

## 2019-01-03 DIAGNOSIS — N179 Acute kidney failure, unspecified: Principal | ICD-10-CM

## 2019-01-03 LAB — BASIC METABOLIC PANEL
Anion gap: 10 (ref 5–15)
BUN: 17 mg/dL (ref 8–23)
CO2: 24 mmol/L (ref 22–32)
Calcium: 8.8 mg/dL — ABNORMAL LOW (ref 8.9–10.3)
Chloride: 102 mmol/L (ref 98–111)
Creatinine, Ser: 1.32 mg/dL — ABNORMAL HIGH (ref 0.61–1.24)
GFR calc Af Amer: >60 mL/min (ref >60)
GFR calc non Af Amer: 56 mL/min — ABNORMAL LOW (ref >60)
Glucose, Bld: 118 mg/dL — ABNORMAL HIGH (ref 70–99)
Potassium: 3.8 mmol/L (ref 3.5–5.1)
Sodium: 136 mmol/L (ref 135–145)

## 2019-01-03 LAB — CBC WITH DIFFERENTIAL/PLATELET
Abs Immature Granulocytes: 0.1 10*3/uL — ABNORMAL HIGH (ref 0.00–0.07)
Basophils Absolute: 0.1 10*3/uL (ref 0.0–0.1)
Basophils Relative: 1 %
Eosinophils Absolute: 0.3 10*3/uL (ref 0.0–0.5)
Eosinophils Relative: 3 %
HCT: 37.2 % — ABNORMAL LOW (ref 39.0–52.0)
Hemoglobin: 12.1 g/dL — ABNORMAL LOW (ref 13.0–17.0)
Immature Granulocytes: 1 %
Lymphocytes Relative: 16 %
Lymphs Abs: 1.4 10*3/uL (ref 0.7–4.0)
MCH: 30.6 pg (ref 26.0–34.0)
MCHC: 32.5 g/dL (ref 30.0–36.0)
MCV: 94.2 fL (ref 80.0–100.0)
Monocytes Absolute: 0.6 10*3/uL (ref 0.1–1.0)
Monocytes Relative: 7 %
Neutro Abs: 6.4 10*3/uL (ref 1.7–7.7)
Neutrophils Relative %: 72 %
Platelets: 232 10*3/uL (ref 150–400)
RBC: 3.95 MIL/uL — ABNORMAL LOW (ref 4.22–5.81)
RDW: 16.9 % — ABNORMAL HIGH (ref 11.5–15.5)
WBC: 8.9 10*3/uL (ref 4.0–10.5)
nRBC: 0 % (ref 0.0–0.2)

## 2019-01-03 LAB — GLUCOSE, CAPILLARY
Glucose-Capillary: 105 mg/dL — ABNORMAL HIGH (ref 70–99)
Glucose-Capillary: 189 mg/dL — ABNORMAL HIGH (ref 70–99)
Glucose-Capillary: 132 mg/dL — ABNORMAL HIGH (ref 70–99)
Glucose-Capillary: 97 mg/dL (ref 70–99)

## 2019-01-03 LAB — UREA NITROGEN, URINE: Urea Nitrogen, Ur: 638 mg/dL

## 2019-01-03 MED ORDER — SACUBITRIL-VALSARTAN 24-26 MG PO TABS
1.0000 | ORAL_TABLET | Freq: Two times a day (BID) | ORAL | Status: DC
  Administered 2019-01-03 (×2): 1 via ORAL
  Filled 2019-01-03 (×3): qty 1

## 2019-01-03 NOTE — Progress Notes (Signed)
PROGRESS NOTE    Curtis [name]  ZOX:[id number] DOB: [birth year] DOA: 01/01/2019 PCP: Patient, No Pcp Per   Brief Narrative:  Curtis [name] is a 68 y.o. male with medical history significant for coronary artery disease status post CABG on 11/30/2018, EF 25 to 30%, status post mitral valve replacement who presented to ED on 01/01/2019 for evaluation of generalized weakness, near syncope, and low blood pressure.  Patient was discharged from inpatient rehab on 12/25/2018, reports that he is doing okay back at home, but has had some generalized weakness and fatigue, and became acutely weak in general and felt as though he might pass out while walking up stairs today.  There was no chest pain associated with this and no palpitations.  He did not actually lose consciousness or fall.  He was reported to be diaphoretic at this time.  EMS reports orthostatic hypotension with systolic blood pressure 84 on standing.  He was given 500 cc normal saline with EMS prior to arrival in the ED.  The patient denies any recent fevers or chills, denies any cough, and denies shortness of breath.  He denies leg swelling or tenderness. Upon arrival to the ED, patient is found to be afebrile, saturating well on room air, and with blood pressure 111/63.  EKG features a sinus rhythm with nonspecific IVCD and repolarization abnormality.  Chemistry panel is notable for glucose of 238, potassium 3.4, and creatinine 1.87, up from 1.2 a week earlier.  CBC features a leukocytosis to 14,400.  High-sensitivity troponin is normal x2.  Patient was given a liter of normal saline in the emergency department and admitted to hospital service with cardiology to consult.  Assessment & Plan:   Principal Problem:   AKI (acute kidney injury) ([facility]) Active Problems:   Chronic combined systolic and diastolic CHF (congestive heart failure) ([facility])   S/P CABG x 3   S/P MVR (mitral valve replacement)   PAF (paroxysmal atrial fibrillation) (H[facility])   Orthostatic  hypotension   Hyperglycemia   1. Orthostatic hypotension; near-syncope  - Continues to feel better.  Continues to remain lethargic and sleepy.  He tells me he sleeps almost 12 hours from 12 AM to 12 PM every day thus he is always sleepy in the morning before 12.  Seen by cardiology and his Sherryll Burgerntresto has been resumed.  Cardiology recommends watching overnight and reassessing tomorrow morning.  2. Acute kidney injury  - SCr is 1.87 on admission, up from an apparent baseline of 1.2.  Now back to baseline at 1.32.  This was likely prerenal azotemia due to low BP.  Will recheck in the morning.  3. CAD  - No anginal complaints  - Continue ASA and statin    4. Chronic systolic CHF  - Appears compensated.  Echo shows slight drop in ejection fraction up to 20 to 25%.  Entresto resumed by cardiology today.  5. PAF  - Patient developed PAF during hospitalization last month and was started on amiodarone.  Now in sinus rhythm.  Continue amiodarone.  He is not on any anticoagulation.  Will defer to cardiology.  DVT prophylaxis: Heparin subcu Code Status: Full code Family Communication:  None present at bedside.  Plan of care discussed with patient in length and he verbalized understanding and agreed with it. Disposition Plan: Potentially discharge tomorrow if cleared by cardiology.  Estimated body mass index is 24.9 kg/m as calculated from the following:   Height as of this encounter: 5\' 9"  (1.753 m).   Weight as  of this encounter: 76.5 kg.      Nutritional status:               Consultants:   Cardiology  Procedures:   None  Antimicrobials:   None   Subjective: Patient seen and examined.  Once again he is sleepy.  He tells me that he sleeps 12 hours a day, from 12 AM to 12 PM and he is always sleepy before 12 PM.  Does not have any other complaint.  No shortness of breath.  Continues to feel good and better every day.  Looking at him, he looks weak, this could very  well be due to his sleepiness however I offered him evaluation by PT OT but he declined stating that he would go home no matter what and does not feel that he needs to be assessed by PT.  Objective: Vitals:   01/02/19 1950 01/03/19 0624 01/03/19 0814 01/03/19 0819  BP: (!) 126/59 (!) 141/67  129/70  Pulse: 82 80 81   Resp: 15 18    Temp: 99 F (37.2 C) 97.8 F (36.6 C) 98.2 F (36.8 C)   TempSrc: Oral Oral Oral   SpO2: 99% 98% 98%   Weight:  76.5 kg    Height:        Intake/Output Summary (Last 24 hours) at 01/03/2019 1350 Last data filed at 01/03/2019 1300 Gross per 24 hour  Intake 966 ml  Output 900 ml  Net 66 ml   Filed Weights   01/01/19 1848 01/02/19 0155 01/03/19 0624  Weight: 81.6 kg 77.3 kg 76.5 kg    Examination:  General exam: Appears sleepy Respiratory system: Clear to auscultation. Respiratory effort normal. Cardiovascular system: S1 & S2 heard, RRR. No JVD, murmurs, rubs, gallops or clicks. No pedal edema. Gastrointestinal system: Abdomen is nondistended, soft and nontender. No organomegaly or masses felt. Normal bowel sounds heard. Central nervous system: Sleepy and oriented. No focal neurological deficits. Extremities: Symmetric 5 x 5 power. Skin: No rashes, lesions or ulcers.  Psychiatry: Judgement and insight appear poor   Data Reviewed: I have personally reviewed following labs and imaging studies  CBC: Recent Labs  Lab 01/01/19 1853 01/02/19 0420 01/03/19 1013  WBC 14.4* 11.6* 8.9  NEUTROABS 12.3* 8.3* 6.4  HGB 12.2* 11.3* 12.1*  HCT 39.1 35.0* 37.2*  MCV 96.3 94.9 94.2  PLT 237 213 232   Basic Metabolic Panel: Recent Labs  Lab 01/01/19 1853 01/02/19 0420 01/03/19 1013  NA 133* 135 136  K 3.4* 4.1 3.8  CL 94* 100 102  CO2 25 25 24   GLUCOSE 238* 116* 118*  BUN 32* 27* 17  CREATININE 1.87* 1.66* 1.32*  CALCIUM 8.4* 8.3* 8.8*  MG  --  1.7  --    GFR: Estimated Creatinine Clearance: 55 mL/min (A) (by C-G formula based on SCr of  1.32 mg/dL (H)). Liver Function Tests: Recent Labs  Lab 01/01/19 1853  AST 20  ALT 25  ALKPHOS 93  BILITOT 1.0  PROT 6.7  ALBUMIN 3.1*   No results for input(s): LIPASE, AMYLASE in the last 168 hours. No results for input(s): AMMONIA in the last 168 hours. Coagulation Profile: No results for input(s): INR, PROTIME in the last 168 hours. Cardiac Enzymes: No results for input(s): CKTOTAL, CKMB, CKMBINDEX, TROPONINI in the last 168 hours. BNP (last 3 results) No results for input(s): PROBNP in the last 8760 hours. HbA1C: Recent Labs    01/02/19 0420  HGBA1C 5.5   CBG: Recent  Labs  Lab 01/02/19 1246 01/02/19 1640 01/02/19 2121 01/03/19 0811 01/03/19 1209  GLUCAP 141* 105* 148* 105* 189*   Lipid Profile: No results for input(s): CHOL, HDL, LDLCALC, TRIG, CHOLHDL, LDLDIRECT in the last 72 hours. Thyroid Function Tests: No results for input(s): TSH, T4TOTAL, FREET4, T3FREE, THYROIDAB in the last 72 hours. Anemia Panel: No results for input(s): VITAMINB12, FOLATE, FERRITIN, TIBC, IRON, RETICCTPCT in the last 72 hours. Sepsis Labs: No results for input(s): PROCALCITON, LATICACIDVEN in the last 168 hours.  Recent Results (from the past 240 hour(s))  SARS CORONAVIRUS 2 (TAT 6-24 HRS) Nasopharyngeal Nasopharyngeal Swab     Status: None   Collection Time: 01/01/19 10:02 PM   Specimen: Nasopharyngeal Swab  Result Value Ref Range Status   SARS Coronavirus 2 NEGATIVE NEGATIVE Final    Comment: (NOTE) SARS-CoV-2 target nucleic acids are NOT DETECTED. The SARS-CoV-2 RNA is generally detectable in upper and lower respiratory specimens during the acute phase of infection. Negative results do not preclude SARS-CoV-2 infection, do not rule out co-infections with other pathogens, and should not be used as the sole basis for treatment or other patient management decisions. Negative results must be combined with clinical observations, patient history, and epidemiological  information. The expected result is Negative. Fact Sheet for Patients: SugarRoll.be Fact Sheet for Healthcare Providers: https://www.woods-mathews.com/ This test is not yet approved or cleared by the [place] FDA and  has been authorized for detection and/or diagnosis of SARS-CoV-2 by FDA under an Emergency Use Authorization (EUA). This EUA will remain  in effect (meaning this test can be used) for the duration of the COVID-19 declaration under Section 56 4(b)(1) of the Act, 21 U.S.C. section 360bbb-3(b)(1), unless the authorization is terminated or revoked sooner. Performed at [facility], [place] [address], [place], [place] [address]       Radiology Studies: Dg Chest [place] 1 View  Result Date: 01/02/2019 CLINICAL DATA:  68 year old male with recent history of CABG presenting with weakness. EXAM: PORTABLE CHEST 1 VIEW COMPARISON:  Chest radiograph dated 12/11/2018 FINDINGS: There is no focal consolidation, pleural effusion, or pneumothorax. There is stable cardiomegaly. Median sternotomy wires and mechanical heart valve noted. No acute osseous pathology. IMPRESSION: 1. No acute cardiopulmonary process. 2. Stable cardiomegaly. Electronically Signed   By: Anner Crete M.D.   On: 01/02/2019 00:13    Scheduled Meds: . amiodarone  200 mg Oral Daily  . aspirin  325 mg Oral Daily  . heparin  5,000 Units Subcutaneous Q8H  . insulin aspart  0-5 Units Subcutaneous QHS  . insulin aspart  0-9 Units Subcutaneous TID WC  . pantoprazole  40 mg Oral Daily  . rosuvastatin  40 mg Oral q1800  . sacubitril-valsartan  1 tablet Oral BID  . sodium chloride flush  3 mL Intravenous Q12H  . sodium chloride flush  3 mL Intravenous Q12H   Continuous Infusions: . sodium chloride       LOS: 1 day   Time spent: 28 minutes   Darliss Cheney, MD [facility]  01/03/2019, 1:50 PM   To contact the attending provider between 7A-7P or the  covering provider during after hours 7P-7A, please log into the web site [email or web address] and use password [email or web address].

## 2019-01-03 NOTE — Progress Notes (Addendum)
Progress Note  Patient Name: Curtis [name] Date of Encounter: 01/03/2019  Primary Cardiologist: Pixie Casino, MD   Subjective   Curtis [name] is a 68 year old gentleman with a history of coronary artery disease-status post CABG.  He had a mitral valve replacement on November 30, 2018.  He had a complicated hospital course including postoperative cardiogenic shock with an ejection fraction of 25%.  He required inotropes and Impella.  He had acute blood loss anemia. He had some generalized fatigue and had an episode of presyncope yesterday.  We are were asked to see him today for further evaluation.  He was given a liter of normal saline in the emergency room.  Was noted that his creatinine was up to 1.87 which is increased from 1.2 a week earlier.  White blood cell count was elevated at 14.4 K.  His blood pressure is better.  He is feeling better. We will restart Entresto today.  He is anxious about leaving today.  We will watch him 1 more day and make sure that he remained stable and will add medicines and very slowly.  Inpatient Medications    Scheduled Meds: . amiodarone  200 mg Oral Daily  . aspirin  325 mg Oral Daily  . heparin  5,000 Units Subcutaneous Q8H  . insulin aspart  0-5 Units Subcutaneous QHS  . insulin aspart  0-9 Units Subcutaneous TID WC  . pantoprazole  40 mg Oral Daily  . rosuvastatin  40 mg Oral q1800  . sodium chloride flush  3 mL Intravenous Q12H  . sodium chloride flush  3 mL Intravenous Q12H   Continuous Infusions: . sodium chloride     PRN Meds: sodium chloride, acetaminophen **OR** acetaminophen, guaiFENesin, HYDROcodone-acetaminophen, ondansetron **OR** ondansetron (ZOFRAN) IV, polyethylene glycol, sodium chloride flush   Vital Signs    Vitals:   01/02/19 1950 01/03/19 0624 01/03/19 0814 01/03/19 0819  BP: (!) 126/59 (!) 141/67  129/70  Pulse: 82 80 81   Resp: 15 18    Temp: 99 F (37.2 C) 97.8 F (36.6 C) 98.2 F (36.8 C)   TempSrc: Oral Oral  Oral   SpO2: 99% 98% 98%   Weight:  76.5 kg    Height:        Intake/Output Summary (Last 24 hours) at 01/03/2019 0946 Last data filed at 01/02/2019 2154 Gross per 24 hour  Intake 846 ml  Output 850 ml  Net -4 ml   Last 3 Weights 01/03/2019 01/02/2019 01/01/2019  Weight (lbs) 168 lb 9.6 oz 170 lb 8 oz 180 lb  Weight (kg) 76.476 kg 77.338 kg 81.647 kg      Telemetry    NSR - Personally Reviewed  ECG     - Personally Reviewed  Physical Exam   Physical Exam: Blood pressure 129/70, pulse 81, temperature 98.2 F (36.8 C), temperature source Oral, resp. rate 18, height 5\' 9"  (1.753 m), weight 76.5 kg, SpO2 98 %.  GEN: Middle-aged gentleman, no acute distress HEENT: Normal NECK: No JVD; No carotid bruits LYMPHATICS: No lymphadenopathy CARDIAC: Regular rate, S1-S2.  Soft systolic murmur. RESPIRATORY:  Clear to auscultation without rales, wheezing or rhonchi  ABDOMEN: Soft, non-tender, non-distended MUSCULOSKELETAL:  No edema; No deformity  SKIN: Warm and dry NEUROLOGIC:  Alert and oriented x 3   Labs    High Sensitivity Troponin:   Recent Labs  Lab 01/01/19 1853 01/01/19 2104  TROPONINIHS 16 15      Chemistry Recent Labs  Lab 01/01/19 1853 01/02/19 0420  NA 133* 135  K 3.4* 4.1  CL 94* 100  CO2 25 25  GLUCOSE 238* 116*  BUN 32* 27*  CREATININE 1.87* 1.66*  CALCIUM 8.4* 8.3*  PROT 6.7  --   ALBUMIN 3.1*  --   AST 20  --   ALT 25  --   ALKPHOS 93  --   BILITOT 1.0  --   GFRNONAA 37* 42*  GFRAA 42* 49*  ANIONGAP 14 10     Hematology Recent Labs  Lab 01/01/19 1853 01/02/19 0420  WBC 14.4* 11.6*  RBC 4.06* 3.69*  HGB 12.2* 11.3*  HCT 39.1 35.0*  MCV 96.3 94.9  MCH 30.0 30.6  MCHC 31.2 32.3  RDW 17.1* 17.2*  PLT 237 213    BNPNo results for input(s): BNP, PROBNP in the last 168 hours.   DDimer No results for input(s): DDIMER in the last 168 hours.   Radiology    Dg Chest Port 1 View  Result Date: 01/02/2019 CLINICAL DATA:   68 year old male with recent history of CABG presenting with weakness. EXAM: PORTABLE CHEST 1 VIEW COMPARISON:  Chest radiograph dated 12/11/2018 FINDINGS: There is no focal consolidation, pleural effusion, or pneumothorax. There is stable cardiomegaly. Median sternotomy wires and mechanical heart valve noted. No acute osseous pathology. IMPRESSION: 1. No acute cardiopulmonary process. 2. Stable cardiomegaly. Electronically Signed   By: Elgie Collard M.D.   On: 01/02/2019 00:13    Cardiac Studies      Patient Profile     68 y.o. male status post mitral valve replacement.  He had acute LV failure following his surgery.  He is admitted with generalized weakness and presyncope related to hypotension.  Assessment & Plan    1.  Leg weakness and near syncope: I suspect this was due to hypotension/orthostatic hypotension.  Will be titrating his medications in very slowly.  2.  Acute on chronic systolic congestive heart failure: His ejection fraction was 25 to 30% by echo on September 24.  Echocardiogram from yesterday revealed severe LV dysfunction with ejection fraction 20 to 25%. We will restart Entresto 24-26 twice a day today.  We will hold off on diuretics for now.  I would likely restart spironolactone at a low dose and restart torsemide only if/when he becomes volume overloaded.  He will need to be watched very closely.  He may be a good candidate for the advanced CHF clinic.  Will watch overnight to make sure he tolerates the entresto. We can discuss Dc tomorrow.    3.  CAD -no angina at present.    For questions or updates, please contact [facility] Please consult [email or web address] for contact info under        Signed, Kristeen Miss, MD  01/03/2019, 9:46 AM

## 2019-01-04 DIAGNOSIS — R55 Syncope and collapse: Secondary | ICD-10-CM

## 2019-01-04 LAB — BASIC METABOLIC PANEL
Anion gap: 10 (ref 5–15)
BUN: 14 mg/dL (ref 8–23)
CO2: 22 mmol/L (ref 22–32)
Calcium: 8.3 mg/dL — ABNORMAL LOW (ref 8.9–10.3)
Chloride: 101 mmol/L (ref 98–111)
Creatinine, Ser: 1.24 mg/dL (ref 0.61–1.24)
GFR calc Af Amer: >60 mL/min (ref >60)
GFR calc non Af Amer: >60 mL/min (ref >60)
Glucose, Bld: 123 mg/dL — ABNORMAL HIGH (ref 70–99)
Potassium: 4.4 mmol/L (ref 3.5–5.1)
Sodium: 133 mmol/L — ABNORMAL LOW (ref 135–145)

## 2019-01-04 LAB — GLUCOSE, CAPILLARY
Glucose-Capillary: 121 mg/dL — ABNORMAL HIGH (ref 70–99)
Glucose-Capillary: 145 mg/dL — ABNORMAL HIGH (ref 70–99)
Glucose-Capillary: 101 mg/dL — ABNORMAL HIGH (ref 70–99)

## 2019-01-04 MED ORDER — TORSEMIDE 10 MG PO TABS: 10.0000 mg | ORAL_TABLET | Freq: Every day | ORAL | 0 refills | Status: DC | PRN

## 2019-01-04 NOTE — Progress Notes (Signed)
Progress Note  Patient Name: Curtis [name] Date of Encounter: 01/04/2019  Primary Cardiologist: Pixie Casino, MD   Subjective   Curtis [name] is a 68 year old gentleman s/p CABG.  S/p MVR November 30, 2018.  He had a complicated hospital course including postoperative cardiogenic shock with an ejection fraction of 25%.  He required inotropes and Impella.  He had acute blood loss anemia. D/c home from rehab 10/23. He was on Entreston  He had some generalized fatigue and had an episode of presyncope 10/30. Orthostatic VS were positive. He was given a liter of normal saline in the emergency room.  Was noted that his creatinine was up to 1.87 which is increased from 1.2 a week earlier.  White blood cell count was elevated at 14.4 K.  11-2: pt denies sx with VS this am. Denies CP/SOB  Inpatient Medications    Scheduled Meds: . amiodarone  200 mg Oral Daily  . aspirin  325 mg Oral Daily  . heparin  5,000 Units Subcutaneous Q8H  . pantoprazole  40 mg Oral Daily  . rosuvastatin  40 mg Oral q1800  . sacubitril-valsartan  1 tablet Oral BID  . sodium chloride flush  3 mL Intravenous Q12H  . sodium chloride flush  3 mL Intravenous Q12H   Continuous Infusions: . sodium chloride     PRN Meds: sodium chloride, acetaminophen **OR** acetaminophen, guaiFENesin, HYDROcodone-acetaminophen, ondansetron **OR** ondansetron (ZOFRAN) IV, polyethylene glycol, sodium chloride flush   Vital Signs    Vitals:   01/03/19 1652 01/03/19 1945 01/04/19 0706 01/04/19 0855  BP: 119/65 118/60 117/64 122/67  Pulse: 82 86 85 82  Resp:  18 20 20   Temp: 98.4 F (36.9 C) 98.5 F (36.9 C) 98.7 F (37.1 C) 98.2 F (36.8 C)  TempSrc: Oral Oral Oral Oral  SpO2: 100% 97% 98% 97%  Weight:   76.1 kg   Height:        Intake/Output Summary (Last 24 hours) at 01/04/2019 0917 Last data filed at 01/04/2019 0818 Gross per 24 hour  Intake 120 ml  Output 500 ml  Net -380 ml   Last 3 Weights 01/04/2019 01/03/2019  01/02/2019  Weight (lbs) 167 lb 12.8 oz 168 lb 9.6 oz 170 lb 8 oz  Weight (kg) 76.114 kg 76.476 kg 77.338 kg     Orthostatic VS for the past 24 hrs (Last 3 readings):  BP- Lying Pulse- Lying BP- Sitting Pulse- Sitting BP- Standing at 0 minutes Pulse- Standing at 0 minutes BP- Standing at 3 minutes Pulse- Standing at 3 minutes  01/04/19 0706 117/64 79 (!) 69/52 96 (!) 79/50 61 (!) 66/43 52    Telemetry    SR, sinus brady - Personally Reviewed  ECG    None today - Personally Reviewed  Physical Exam   Physical Exam: Blood pressure 122/67, pulse 82, temperature 98.2 F (36.8 C), temperature source Oral, resp. rate 20, height 5\' 9"  (1.753 m), weight 76.1 kg, SpO2 97 %.  General: Well developed, well nourished, male in no acute distress Head: Eyes PERRLA, Head normocephalic and atraumatic Lungs: clear bilaterally to auscultation. Heart: HRRR S1 S2, without rub or gallop. Soft murmur. 4/4 extremity pulses are 2+ & equal. No JVD. Abdomen: Bowel sounds are present, abdomen soft and non-tender without masses or  hernias noted. Msk: Normal strength and tone for age. Extremities: No clubbing, cyanosis or edema.    Skin:  No rashes or lesions noted. Neuro: Alert and oriented X 3. Psych:  Good affect, responds  appropriately   Labs    High Sensitivity Troponin:   Recent Labs  Lab 01/01/19 1853 01/01/19 2104  TROPONINIHS 16 15      Chemistry Recent Labs  Lab 01/01/19 1853 01/02/19 0420 01/03/19 1013 01/04/19 0813  NA 133* 135 136 133*  K 3.4* 4.1 3.8 4.4  CL 94* 100 102 101  CO2 25 25 24 22   GLUCOSE 238* 116* 118* 123*  BUN 32* 27* 17 14  CREATININE 1.87* 1.66* 1.32* 1.24  CALCIUM 8.4* 8.3* 8.8* 8.3*  PROT 6.7  --   --   --   ALBUMIN 3.1*  --   --   --   AST 20  --   --   --   ALT 25  --   --   --   ALKPHOS 93  --   --   --   BILITOT 1.0  --   --   --   GFRNONAA 37* 42* 56* >60  GFRAA 42* 49* >60 >60  ANIONGAP 14 10 10 10      Hematology Recent Labs  Lab  01/01/19 1853 01/02/19 0420 01/03/19 1013  WBC 14.4* 11.6* 8.9  RBC 4.06* 3.69* 3.95*  HGB 12.2* 11.3* 12.1*  HCT 39.1 35.0* 37.2*  MCV 96.3 94.9 94.2  MCH 30.0 30.6 30.6  MCHC 31.2 32.3 32.5  RDW 17.1* 17.2* 16.9*  PLT 237 213 232     Radiology    No results found.  Cardiac Studies    ECHO: 01/01/2019  1. Left ventricular ejection fraction, by visual estimation, is 20 to 25%. The left ventricle has severely decreased function. There is no left ventricular hypertrophy.  2. Abnormal septal motion consistent with left bundle branch block.  3. Global right ventricle has normal systolic function.The right ventricular size is normal. No increase in right ventricular wall thickness.  4. Left atrial size was normal.  5. Right atrial size was normal.  6. 27 mm bioprosthetic vale is present in the mitral position.  7. The mitral valve has been repaired/replaced. No evidence of mitral valve regurgitation. No evidence of mitral stenosis.  8. The tricuspid valve is normal in structure. Tricuspid valve regurgitation is not demonstrated.  9. The aortic valve is tricuspid. Aortic valve regurgitation is mild. No evidence of aortic valve sclerosis or stenosis. 10. The pulmonic valve was normal in structure. Pulmonic valve regurgitation is not visualized. 11. Normal pulmonary artery systolic pressure. 12. The inferior vena cava is normal in size with <50% respiratory variability, suggesting right atrial pressure of 8 mmHg. 13. MagnaEase 27mm bioprosthetic valve is well seated and functioning normally. 14. There is residual posterior papillary muscle from native valve removal that is freely mobile. This is not a vegetation. Personally reviewed TEE intraoperatively and this finding was seen on final image.  Patient Profile     68 y.o. male status post mitral valve replacement.  He had acute LV failure following his surgery.  He is admitted 10/30 with generalized weakness and presyncope related to  orthostatic hypotension.  Partial home med list Medication Sig  sacubitril-valsartan (ENTRESTO) 24-26 MG Take 1 tablet by mouth 2 (two) times daily.  spironolactone (ALDACTONE) 25 MG tablet Take 1 tablet (25 mg total) by mouth daily.  torsemide (DEMADEX) 10 MG tablet Take 1 tablet (10 mg total) by mouth daily.   Assessment & Plan    1.  Leg weakness and near syncope:  - EMS reported + orthostatic VS w/ SBP initially 80s - s/p IVF  and improved on admit, but now orthostatics are strongly positive - got 2 doses Entresto 11/01, only BP lowering rx>>d/c this for now - did not eat breakfast this am, but says has adequate po intake  2.  Acute on chronic systolic congestive heart failure:  - 09/24 EF 25-30%, this admit, EF 20-25% by echo  - Entresto restarted 11/01 - no diuretics for now, was on low-dose torsemide and spiro pta - continue to hold diuretics for now - consider low-dose losartan to try to get rx on board once orthostatics improve  3.  CAD - no ischemic sx  4. ARI - Cr 1.87 on admit, improving, follow   For questions or updates, please contact [facility] Please consult [email or web address] for contact info under        Signed, Theodore Demark, PA-C  01/04/2019, 9:17 AM

## 2019-01-04 NOTE — Discharge Instructions (Signed)
Drink between 1.5 and 2 quarts (or liters) of fluid daily, cut back if you wake up with leg swelling or get more short of breath.  Limit sodium to less than 2000 mg daily, but make sure to eat 3 meals, even if only a meal bar or Ensure.  Weigh daily, first thing upon waking up and in the same amount of clothing.  Take the torsemide only for a weight gain of 3 lbs in a day or 5 lbs in a week.   Get a blood pressure cuff and follow your blood pressure daily, take at different times. Bring records to your follow up appointments.     Heart Failure and Exercise Heart failure is a condition in which the heart does not fill or pump enough blood and oxygen to support your body and its functions. Heart failure is a long-term (chronic) condition. Living with heart failure can be challenging. However, following your health care provider's instructions about a healthy lifestyle may help improve your symptoms. This includes choosing the right exercise plan. Doing daily physical activity is important after a diagnosis of heart failure. You may have some activity restrictions, so talk to your health care provider before doing any exercises. What are the benefits of exercise? Exercise may:  Make your heart muscles stronger.  Lower your blood pressure.  Lower your cholesterol.  Help you lose weight.  Help your bones stay strong.  Improve your blood circulation.  Help your body use oxygen better. This relieves symptoms such as fatigue and shortness of breath.  Help your mental health by lowering the risk of depression and other problems.  Improve your quality of life.  Decrease your chance of hospital admission for heart failure. What is an exercise plan? An exercise plan is a set of specific exercises and training activities. You will work with your health care provider to create the exercise plan that works for you. The plan may include:  Different types of exercises and how to do  them.  Cardiac rehabilitation exercises. These are supervised programs that are designed to strengthen your heart. What are strengthening exercises? Strengthening exercises are a type of physical activity that involves using resistance to improve your muscle strength. Strengthening exercises usually have repetitive motions. These types of exercises can include:  Lifting weights.  Using weight machines.  Using resistance tubes and bands.  Using kettlebells.  Using your body weight, such as doing push-ups or squats. What are balance exercises? Balance exercises are another type of physical activity. They strengthen the muscles of the back, stomach, and pelvis (core muscles) and improve your balance. They can also lower your risk of falling. These types of exercises can include:  Standing on one leg.  Walking backward, sideways, and in a straight line.  Standing up after sitting, without using your hands.  Shifting your weight from one leg to the other.  Lifting one leg in front of you.  Doing tai chi. This is a type of exercise that uses slow movements and deep breathing. How can I increase my flexibility? Having better flexibility can keep you from falling. It can also lengthen your muscles, improve your range of motion, and help your joints. You can increase your flexibility by:  Doing tai chi.  Doing yoga.  Stretching. How much aerobic exercise should I get?  Aerobic exercises strengthen your breathing and circulation system and increase your body's use of oxygen. Examples of aerobic exercise include biking, walking, running, and swimming. Talk to your health  care provider to find out how much aerobic exercise is safe for you.  To do these exercises:  Start exercising slowly, limiting the amount of time at first. You may need to start with 5 minutes of aerobic exercise every day.  Slowly add more minutes until you can safely do at least 30 minutes of exercise at least 4  days a week. Summary  Daily physical activity is important after a diagnosis of heart failure.  Exercise can make your heart muscles stronger. It also offers other benefits that will improve your health.  Talk to your health care provider before doing any exercises. This information is not intended to replace advice given to you by your health care provider. Make sure you discuss any questions you have with your health care provider. Document Released: 07/02/2016 Document Revised: 07/05/2016 Document Reviewed: 07/02/2016 Elsevier Patient Education  2020 [employer].

## 2019-01-04 NOTE — Discharge Summary (Signed)
Physician Discharge Summary  Curtis [name] EAV:[id number] DOB: 03-May-1950 DOA: 01/01/2019  PCP: Patient, No Pcp Per  Admit date: 01/01/2019 Discharge date: 01/04/2019  Admitted From: Home Disposition: Home  Recommendations for Outpatient Follow-up:  1. Follow up with PCP in 1-2 weeks 2. Follow with cardiology in 1 to 2 weeks 3. Please obtain BMP/CBC in one week 4. Please follow up on the following pending results:  Home Health: None Equipment/Devices: None  Discharge Condition: Stable CODE STATUS: Full code Diet recommendation: Cardiac  Subjective: Seen and examined.  Feels good.  No more dizziness or any complaint.  Brief/Interim Summary: Curtis [name] a 68 y.o.malewith medical history significant forcoronary artery disease status post CABG on 11/30/2018, EF 25 to 30%, status post mitral valve replacement who presented to ED on 01/01/2019 for evaluation of generalized weakness, near syncope, and low blood pressure.Patient was discharged from inpatient rehab on 12/25/2018, reports that he is doing okay back at home, but has had some generalized weakness and fatigue, and became acutely weak in general and felt as though he might pass out while walking up stairs today. There was no chest pain associated with this and no palpitations. He did not actually lose consciousness or fall. He was reported to be diaphoretic at this time. EMS reports orthostatic hypotension with systolic blood pressure 84 on standing. He was given 500 cc normal saline with EMS prior to arrival in the ED. The patient denies any recent fevers or chills, denies any cough, and denies shortness of breath. He denies leg swelling or tenderness. Upon arrival to the ED, patient is found to be afebrile, saturating well on room air, and with blood pressure 111/63. EKG features a sinus rhythm with nonspecific IVCD and repolarization abnormality. Chemistry panel is notable for glucose of 238, potassium 3.4, and creatinine  1.87, up from 1.2 a week earlier. CBC features a leukocytosis to 14,400. High-sensitivity troponin is normal x2. Patient was given a liter of normal saline in the emergency department and admitted to hospital service with cardiology to consult.  He is a spironolactone and Entresto were held.  Initially was positive orthostatic but then he improved.  His renal function also improved and came back to his baseline.  He was challenged once again with [facility] by cardiology but he did not tolerate it and again he developed hypotension so this was discontinued.  He is feeling much better today off of Entresto and cardiology has seen him and they have recommended discontinuing Entresto and spironolactone at this point in time and to resume his torsemide as as needed.  His repeat orthostatic vitals were again positive however patient did not have any dizziness or any other symptoms.  Patient is feeling better and cardiology has also cleared him so he is going to be discharged back to home in stable condition and he will follow with PCP and cardiology for further medication adjustments.  Discharge Diagnoses:  Principal Problem:   AKI (acute kidney injury) (HCC) Active Problems:   Chronic combined systolic and diastolic CHF (congestive heart failure) (HCC)   S/P CABG x 3   S/P MVR (mitral valve replacement)   PAF (paroxysmal atrial fibrillation) (HCC)   Orthostatic hypotension   Hyperglycemia    Discharge Instructions  Discharge Instructions    Discharge patient   Complete by: As directed    Discharge disposition: 01-Home or Self Care   Discharge patient date: 01/04/2019     Allergies as of 01/04/2019      Reactions   Penicillins  Rash, Other (See Comments)   Tolerated cefepime 12/2018 Did it involve swelling of the face/tongue/throat, SOB, or low BP? Unknown Did it involve sudden or severe rash/hives, skin peeling, or any reaction on the inside of your mouth or nose? Yes Did you need to seek  medical attention at a hospital or doctor's office? Unknown When did it last happen? childhood If all above answers are "NO", may proceed with cephalosporin use.      Medication List    STOP taking these medications   sacubitril-valsartan 24-26 MG Commonly known as: ENTRESTO   spironolactone 25 MG tablet Commonly known as: ALDACTONE     TAKE these medications   amiodarone 200 MG tablet Commonly known as: PACERONE Take 1 tablet (200 mg total) by mouth 2 (two) times daily. For 5 days then take Amiodarone 200 mg daily thereafter What changed:   when to take this  additional instructions   aspirin 325 MG EC tablet Take 1 tablet (325 mg total) by mouth daily.   guaiFENesin 600 MG 12 hr tablet Commonly known as: MUCINEX Take 1 tablet (600 mg total) by mouth 2 (two) times daily.   multivitamin with minerals Tabs tablet Take 1 tablet by mouth daily.   pantoprazole 40 MG tablet Commonly known as: PROTONIX Take 1 tablet (40 mg total) by mouth daily.   rosuvastatin 40 MG tablet Commonly known as: CRESTOR Take 1 tablet (40 mg total) by mouth daily at 6 PM.   torsemide 10 MG tablet Commonly known as: DEMADEX Take 1 tablet (10 mg total) by mouth daily as needed (For weight gain 3 pounds or more in 1 day or 5 pounds or more in 3 days). What changed:   when to take this  reasons to take this      Follow-up Information    Hilty, Lisette Abu, MD Follow up in 1 week(s).   Specialty: Cardiology Contact information: [address] [place] [address] [phone]          Allergies  Allergen Reactions  . Penicillins Rash and Other (See Comments)    Tolerated cefepime 12/2018  Did it involve swelling of the face/tongue/throat, SOB, or low BP? Unknown Did it involve sudden or severe rash/hives, skin peeling, or any reaction on the inside of your mouth or nose? Yes Did you need to seek medical attention at a hospital or doctor's office?  Unknown When did it last happen? childhood If all above answers are "NO", may proceed with cephalosporin use.     Consultations: Cardiology   Procedures/Studies: Dg Chest 2 View  Result Date: 12/11/2018 CLINICAL DATA:  Pleural effusion EXAM: CHEST - 2 VIEW COMPARISON:  Two days ago FINDINGS: Chronic cardiomegaly. Mitral valve replacement. CABG. Right PICC with tip at the SVC. Chronic mild interstitial coarsening. There is no edema, consolidation, or pneumothorax. Trace pleural fluid on both sides. Hyperinflation. IMPRESSION: Trace pleural effusions. Interval removal of chest tube with no visible pneumothorax. Electronically Signed   By: Marnee Spring M.D.   On: 12/11/2018 10:47   Dg Ankle 2 Views Left  Result Date: 12/16/2018 CLINICAL DATA:  Pain, no injury EXAM: LEFT ANKLE - 2 VIEW COMPARISON:  None. FINDINGS: No fracture or dislocation of the left ankle. There is mild medial ankle arthrosis. Small plantar and Achilles calcaneal spurs. Soft tissues are unremarkable. IMPRESSION: No fracture or dislocation of the left ankle. There is mild medial ankle arthrosis. Small plantar and Achilles calcaneal spurs. Soft tissues are unremarkable. Electronically Signed  By: Lauralyn Primes M.D.   On: 12/16/2018 12:13   Ct Head Wo Contrast  Result Date: 12/07/2018 CLINICAL DATA:  Altered level of consciousness. EXAM: CT HEAD WITHOUT CONTRAST TECHNIQUE: Contiguous axial images were obtained from the base of the skull through the vertex without intravenous contrast. COMPARISON:  None. FINDINGS: Brain: No evidence of acute infarction, hemorrhage, extra-axial collection, ventriculomegaly, or mass effect. Small left basal ganglia lacunar infarct. Generalized cerebral atrophy. Periventricular white matter low attenuation likely secondary to microangiopathy. Vascular: Cerebrovascular atherosclerotic calcifications are noted. Skull: Negative for fracture or focal lesion. Sinuses/Orbits: Visualized portions of  the orbits are unremarkable. Mastoid sinuses are clear. Mucous retention cyst in bilateral maxillary sinuses. Other: None. IMPRESSION: 1. No acute intracranial pathology. 2. Chronic microvascular disease and cerebral atrophy. Electronically Signed   By: Elige Ko   On: 12/07/2018 09:06   Ct Angio Chest Pe W Or Wo Contrast  Result Date: 12/07/2018 CLINICAL DATA:  Hypoxia, abdominal distention. EXAM: CT ANGIOGRAPHY CHEST CT ABDOMEN AND PELVIS WITH CONTRAST TECHNIQUE: Multidetector CT imaging of the chest was performed using the standard protocol during bolus administration of intravenous contrast. Multiplanar CT image reconstructions and MIPs were obtained to evaluate the vascular anatomy. Multidetector CT imaging of the abdomen and pelvis was performed using the standard protocol during bolus administration of intravenous contrast. CONTRAST:  OMNIPAQUE IOHEXOL 350 MG/ML SOLN COMPARISON:  CT scan of November 27, 2018. FINDINGS: CTA CHEST FINDINGS Cardiovascular: Satisfactory opacification of the pulmonary arteries to the segmental level. No evidence of pulmonary embolism. Mild cardiomegaly is noted. Atherosclerosis of thoracic aorta is noted without aneurysm formation. Status post mitral valve repair. Coronary artery calcifications are noted. No pericardial effusion. Mediastinum/Nodes: The esophagus is unremarkable. No significant adenopathy is noted. Thyroid gland is unremarkable. Mediastinal drain is noted anteriorly. Lungs/Pleura: Bilateral chest tubes are noted without definite pneumothorax. Mild bilateral lower lobe subsegmental atelectasis is noted. Musculoskeletal: Sternotomy wires are noted. No other significant osseous abnormality is noted. Review of the MIP images confirms the above findings. CT ABDOMEN and PELVIS FINDINGS Hepatobiliary: No focal liver abnormality is seen. No gallstones, gallbladder wall thickening, or biliary dilatation. Pancreas: Unremarkable. No pancreatic ductal dilatation  or surrounding inflammatory changes. Spleen: Normal in size without focal abnormality. Adrenals/Urinary Tract: Adrenal glands appear normal. 13 mm calculus is noted in upper pole collecting system of left kidney. 8 mm calculus is also noted in upper pole of left kidney. Smaller nonobstructive calculus is seen in lower pole collecting system of right kidney. 1.9 cm rounded hyperdense abnormality is seen arising from lower pole right kidney. Large left parapelvic cysts are noted. No definite hydronephrosis or renal obstruction is noted. Urinary bladder catheter is noted. Stomach/Bowel: Stomach is within normal limits. Appendix appears normal. No evidence of bowel wall thickening, distention, or inflammatory changes. Vascular/Lymphatic: Aortic atherosclerosis. No enlarged abdominal or pelvic lymph nodes. Reproductive: Mild prostatic enlargement is noted. Other: No abdominal wall hernia or abnormality. No abdominopelvic ascites. Musculoskeletal: No acute or significant osseous findings. Review of the MIP images confirms the above findings. IMPRESSION: No definite evidence of pulmonary embolus. Bilateral chest tubes are noted without pneumothorax. Mild bilateral posterior basilar subsegmental atelectasis is noted. 1.9 cm rounded hyperdense abnormality is seen arising from lower pole of right kidney which may represent hyperdense cyst, but neoplasm cannot be excluded. Ultrasound is recommended for further evaluation. Large nonobstructive left renal calculi are noted. Large left renal pelvic cysts are noted. Mild prostatic enlargement. Aortic Atherosclerosis (ICD10-I70.0). Electronically Signed   By: Fayrene Fearing  Christen ButterGreen Jr M.D.   On: 12/07/2018 09:15   Ct Abdomen Pelvis W Contrast  Result Date: 12/07/2018 CLINICAL DATA:  Hypoxia, abdominal distention. EXAM: CT ANGIOGRAPHY CHEST CT ABDOMEN AND PELVIS WITH CONTRAST TECHNIQUE: Multidetector CT imaging of the chest was performed using the standard protocol during bolus  administration of intravenous contrast. Multiplanar CT image reconstructions and MIPs were obtained to evaluate the vascular anatomy. Multidetector CT imaging of the abdomen and pelvis was performed using the standard protocol during bolus administration of intravenous contrast. CONTRAST:  100mL OMNIPAQUE IOHEXOL 350 MG/ML SOLN COMPARISON:  CT scan of November 27, 2018. FINDINGS: CTA CHEST FINDINGS Cardiovascular: Satisfactory opacification of the pulmonary arteries to the segmental level. No evidence of pulmonary embolism. Mild cardiomegaly is noted. Atherosclerosis of thoracic aorta is noted without aneurysm formation. Status post mitral valve repair. Coronary artery calcifications are noted. No pericardial effusion. Mediastinum/Nodes: The esophagus is unremarkable. No significant adenopathy is noted. Thyroid gland is unremarkable. Mediastinal drain is noted anteriorly. Lungs/Pleura: Bilateral chest tubes are noted without definite pneumothorax. Mild bilateral lower lobe subsegmental atelectasis is noted. Musculoskeletal: Sternotomy wires are noted. No other significant osseous abnormality is noted. Review of the MIP images confirms the above findings. CT ABDOMEN and PELVIS FINDINGS Hepatobiliary: No focal liver abnormality is seen. No gallstones, gallbladder wall thickening, or biliary dilatation. Pancreas: Unremarkable. No pancreatic ductal dilatation or surrounding inflammatory changes. Spleen: Normal in size without focal abnormality. Adrenals/Urinary Tract: Adrenal glands appear normal. 13 mm calculus is noted in upper pole collecting system of left kidney. 8 mm calculus is also noted in upper pole of left kidney. Smaller nonobstructive calculus is seen in lower pole collecting system of right kidney. 1.9 cm rounded hyperdense abnormality is seen arising from lower pole right kidney. Large left parapelvic cysts are noted. No definite hydronephrosis or renal obstruction is noted. Urinary bladder catheter is  noted. Stomach/Bowel: Stomach is within normal limits. Appendix appears normal. No evidence of bowel wall thickening, distention, or inflammatory changes. Vascular/Lymphatic: Aortic atherosclerosis. No enlarged abdominal or pelvic lymph nodes. Reproductive: Mild prostatic enlargement is noted. Other: No abdominal wall hernia or abnormality. No abdominopelvic ascites. Musculoskeletal: No acute or significant osseous findings. Review of the MIP images confirms the above findings. IMPRESSION: No definite evidence of pulmonary embolus. Bilateral chest tubes are noted without pneumothorax. Mild bilateral posterior basilar subsegmental atelectasis is noted. 1.9 cm rounded hyperdense abnormality is seen arising from lower pole of right kidney which may represent hyperdense cyst, but neoplasm cannot be excluded. Ultrasound is recommended for further evaluation. Large nonobstructive left renal calculi are noted. Large left renal pelvic cysts are noted. Mild prostatic enlargement. Aortic Atherosclerosis (ICD10-I70.0). Electronically Signed   By: Lupita RaiderJames  Green Jr M.D.   On: 12/07/2018 09:15   Dg Chest Port 1 View  Result Date: 01/02/2019 CLINICAL DATA:  68 year old male with recent history of CABG presenting with weakness. EXAM: PORTABLE CHEST 1 VIEW COMPARISON:  Chest radiograph dated 12/11/2018 FINDINGS: There is no focal consolidation, pleural effusion, or pneumothorax. There is stable cardiomegaly. Median sternotomy wires and mechanical heart valve noted. No acute osseous pathology. IMPRESSION: 1. No acute cardiopulmonary process. 2. Stable cardiomegaly. Electronically Signed   By: Elgie CollardArash  Radparvar M.D.   On: 01/02/2019 00:13   Dg Chest Port 1 View  Result Date: 12/09/2018 CLINICAL DATA:  Pleural effusion.  CABG 5 days ago. EXAM: PORTABLE CHEST 1 VIEW COMPARISON:  CTA chest 12/07/2018 and one-view chest 12/07/2018. FINDINGS: Heart is enlarged. Moderate pulmonary vascular congestion is noted. A left-sided  chest tube  is in place. Bilateral chest tubes are in place. There is no pneumothorax. Right IJ sheath remains. A mediastinal drain is in place. IMPRESSION: 1. Cardiomegaly and moderate pulmonary vascular congestion. 2. Low lung volumes. 3. Support apparatus is stable.  No pneumothorax. Electronically Signed   By: Marin Roberts M.D.   On: 12/09/2018 08:17   Dg Chest Port 1 View  Result Date: 12/07/2018 CLINICAL DATA:  Status post cardiac surgery EXAM: PORTABLE CHEST 1 VIEW COMPARISON:  12/06/2018 FINDINGS: Cardiac shadow is mildly enlarged but stable. Postsurgical changes are again seen. Right jugular sheath is again noted and stable. Bilateral thoracostomy catheters and mediastinal drain are seen. No pneumothorax is noted. Improved aeration is noted in the left base. IMPRESSION: Tubes and lines as described above. No focal infiltrate is noted. Electronically Signed   By: Alcide Clever M.D.   On: 12/07/2018 07:45   Dg Chest Port 1 View  Result Date: 12/06/2018 CLINICAL DATA:  Chest tube in place. EXAM: PORTABLE CHEST 1 VIEW COMPARISON:  12/05/2018 FINDINGS: Right IJ central venous sheath remains in place with tip over the SVC. Bilateral basilar chest tubes as well as mediastinal drain unchanged. Lungs are adequately inflated with stable left base opacification likely small effusion with associated atelectasis. Minimal linear atelectasis right base. No pneumothorax. Mild stable cardiomegaly. Remainder of the exam is unchanged. IMPRESSION: Stable small left effusion with associated basilar atelectasis. Minimal linear atelectasis right base. Tubes and lines as described. Electronically Signed   By: Elberta Fortis M.D.   On: 12/06/2018 08:26   [place] Ekg Site Rite  Result Date: 12/10/2018 If Site Rite image not attached, placement could not be confirmed due to current cardiac rhythm.    Discharge Exam: Vitals:   01/04/19 1423 01/04/19 1426  BP: (!) 75/57 91/73  Pulse:    Resp:    Temp:    SpO2:     Vitals:    01/04/19 1418 01/04/19 1421 01/04/19 1423 01/04/19 1426  BP: 120/69 106/67 (!) 75/57 91/73  Pulse:      Resp:      Temp:      TempSrc:      SpO2:      Weight:      Height:        General: Pt is alert, awake, not in acute distress Cardiovascular: RRR, S1/S2 +, no rubs, no gallops Respiratory: CTA bilaterally, no wheezing, no rhonchi Abdominal: Soft, NT, ND, bowel sounds + Extremities: no edema, no cyanosis    The results of significant diagnostics from this hospitalization (including imaging, microbiology, ancillary and laboratory) are listed below for reference.     Microbiology: Recent Results (from the past 240 hour(s))  SARS CORONAVIRUS 2 (TAT 6-24 HRS) Nasopharyngeal Nasopharyngeal Swab     Status: None   Collection Time: 01/01/19 10:02 PM   Specimen: Nasopharyngeal Swab  Result Value Ref Range Status   SARS Coronavirus 2 NEGATIVE NEGATIVE Final    Comment: (NOTE) SARS-CoV-2 target nucleic acids are NOT DETECTED. The SARS-CoV-2 RNA is generally detectable in upper and lower respiratory specimens during the acute phase of infection. Negative results do not preclude SARS-CoV-2 infection, do not rule out co-infections with other pathogens, and should not be used as the sole basis for treatment or other patient management decisions. Negative results must be combined with clinical observations, patient history, and epidemiological information. The expected result is Negative. Fact Sheet for Patients: HairSlick.no Fact Sheet for Healthcare Providers: quierodirigir.com This test is not yet approved  or cleared by the [place] and  has been authorized for detection and/or diagnosis of SARS-CoV-2 by FDA under an Emergency Use Authorization (EUA). This EUA will remain  in effect (meaning this test can be used) for the duration of the COVID-19 declaration under Section 56 4(b)(1) of the Act, 21 U.S.C. section  360bbb-3(b)(1), unless the authorization is terminated or revoked sooner. Performed at [facility], [place] [address] [place], [place] [address]      Labs: BNP (last 3 results) Recent Labs    11/23/18 2328  BNP 016.0*   Basic Metabolic Panel: Recent Labs  Lab 01/01/19 1853 01/02/19 0420 01/03/19 1013 01/04/19 0813  NA 133* 135 136 133*  K 3.4* 4.1 3.8 4.4  CL 94* 100 102 101  CO2 25 25 24 22   GLUCOSE 238* 116* 118* 123*  BUN 32* 27* 17 14  CREATININE 1.87* 1.66* 1.32* 1.24  CALCIUM 8.4* 8.3* 8.8* 8.3*  MG  --  1.7  --   --    Liver Function Tests: Recent Labs  Lab 01/01/19 1853  AST 20  ALT 25  ALKPHOS 93  BILITOT 1.0  PROT 6.7  ALBUMIN 3.1*   No results for input(s): LIPASE, AMYLASE in the last 168 hours. No results for input(s): AMMONIA in the last 168 hours. CBC: Recent Labs  Lab 01/01/19 1853 01/02/19 0420 01/03/19 1013  WBC 14.4* 11.6* 8.9  NEUTROABS 12.3* 8.3* 6.4  HGB 12.2* 11.3* 12.1*  HCT 39.1 35.0* 37.2*  MCV 96.3 94.9 94.2  PLT 237 213 232   Cardiac Enzymes: No results for input(s): CKTOTAL, CKMB, CKMBINDEX, TROPONINI in the last 168 hours. BNP: Invalid input(s): POCBNP CBG: Recent Labs  Lab 01/03/19 1209 01/03/19 1650 01/03/19 2138 01/04/19 0815 01/04/19 1140  GLUCAP 189* 132* 97 121* 145*   D-Dimer No results for input(s): DDIMER in the last 72 hours. Hgb A1c Recent Labs    01/02/19 0420  HGBA1C 5.5   Lipid Profile No results for input(s): CHOL, HDL, LDLCALC, TRIG, CHOLHDL, LDLDIRECT in the last 72 hours. Thyroid function studies No results for input(s): TSH, T4TOTAL, T3FREE, THYROIDAB in the last 72 hours.  Invalid input(s): FREET3 Anemia work up No results for input(s): VITAMINB12, FOLATE, FERRITIN, TIBC, IRON, RETICCTPCT in the last 72 hours. Urinalysis    Component Value Date/Time   COLORURINE YELLOW 01/02/2019 0022   APPEARANCEUR CLOUDY (A) 01/02/2019 0022   LABSPEC 1.016 01/02/2019 0022   PHURINE 5.0  01/02/2019 0022   GLUCOSEU NEGATIVE 01/02/2019 0022   HGBUR MODERATE (A) 01/02/2019 0022   BILIRUBINUR NEGATIVE 01/02/2019 0022   KETONESUR NEGATIVE 01/02/2019 0022   PROTEINUR 30 (A) 01/02/2019 0022   NITRITE NEGATIVE 01/02/2019 0022   LEUKOCYTESUR NEGATIVE 01/02/2019 0022   Sepsis Labs Invalid input(s): PROCALCITONIN,  WBC,  LACTICIDVEN Microbiology Recent Results (from the past 240 hour(s))  SARS CORONAVIRUS 2 (TAT 6-24 HRS) Nasopharyngeal Nasopharyngeal Swab     Status: None   Collection Time: 01/01/19 10:02 PM   Specimen: Nasopharyngeal Swab  Result Value Ref Range Status   SARS Coronavirus 2 NEGATIVE NEGATIVE Final    Comment: (NOTE) SARS-CoV-2 target nucleic acids are NOT DETECTED. The SARS-CoV-2 RNA is generally detectable in upper and lower respiratory specimens during the acute phase of infection. Negative results do not preclude SARS-CoV-2 infection, do not rule out co-infections with other pathogens, and should not be used as the sole basis for treatment or other patient management decisions. Negative results must be combined with clinical  observations, patient history, and epidemiological information. The expected result is Negative. Fact Sheet for Patients: HairSlick.no Fact Sheet for Healthcare Providers: quierodirigir.com This test is not yet approved or cleared by the [place] FDA and  has been authorized for detection and/or diagnosis of SARS-CoV-2 by FDA under an Emergency Use Authorization (EUA). This EUA will remain  in effect (meaning this test can be used) for the duration of the COVID-19 declaration under Section 56 4(b)(1) of the Act, 21 U.S.C. section 360bbb-3(b)(1), unless the authorization is terminated or revoked sooner. Performed at [facility], [address], [place], [place] [address]      Time coordinating discharge: Over 30 minutes  SIGNED:   Hughie Closs, MD  [facility] 01/04/2019, 2:43 PM  If 7PM-7AM, please contact night-coverage [email or web address] Password [email or web address]

## 2019-01-04 NOTE — Progress Notes (Signed)
Orthostatics completed, Dr. Doristine Bosworth and Rosaria Ferries PA aware of results. Patient had a drop in blood pressure but not as severe as previously. Patient denied any symptoms, no lightheadedness or dizziness. Standing steadily. Blood pressure increased after standing for 3 minutes.

## 2019-01-06 ENCOUNTER — Other Ambulatory Visit: Payer: Self-pay

## 2019-01-06 ENCOUNTER — Encounter: Payer: Self-pay | Admitting: Registered Nurse

## 2019-01-06 ENCOUNTER — Encounter: Payer: Medicare Other | Attending: Registered Nurse | Admitting: Registered Nurse

## 2019-01-06 VITALS — BP 107/75 | HR 99 | Temp 97.7°F | Ht 69.0 in | Wt 174.0 lb

## 2019-01-06 DIAGNOSIS — R5381 Other malaise: Secondary | ICD-10-CM | POA: Diagnosis not present

## 2019-01-06 DIAGNOSIS — I2510 Atherosclerotic heart disease of native coronary artery without angina pectoris: Secondary | ICD-10-CM | POA: Diagnosis not present

## 2019-01-06 DIAGNOSIS — I5042 Chronic combined systolic (congestive) and diastolic (congestive) heart failure: Secondary | ICD-10-CM | POA: Insufficient documentation

## 2019-01-06 DIAGNOSIS — Z952 Presence of prosthetic heart valve: Secondary | ICD-10-CM | POA: Insufficient documentation

## 2019-01-06 DIAGNOSIS — Z951 Presence of aortocoronary bypass graft: Secondary | ICD-10-CM | POA: Diagnosis not present

## 2019-01-06 NOTE — Progress Notes (Signed)
Subjective:    Patient ID: Curtis [name], male    DOB: May 16, 1950, 68 y.o.   MRN: [id number]  HPI: Curtis Kickaul Craun is a 68 y.o. male who is here for Transitional Care Visit in follow up of his debility, S/P CABG x3, S/P MVR and chronic combined systolic and diastolic congestive heart failure.  [name] [facility] on 11/24/2018 for sudden onset of shortness of breath, he was noted to be in significant distress, diaphoretic, tachycardic and tachypneic.  DG: Chest X-ray:  IMPRESSION: 1. Mild cardiomegaly.  Moderate pulmonary edema. 2.  Aortic Atherosclerosis (ICD10-I70.0).  He was admitted for Acute hypoxic hypercapnic respiratory failure secondary to new onset CHF versus flash pulmonary edema from hypertensive emergency, per Dr. Ulyess BlossomVasundhra H&P note.  He underwent Left Heart Cath on 11/24/2018 by Dr Katrinka BlazingSmith.  LEFT HEART CATH AND CORONARY ANGIOGRAPHY  He underwent TEE on 11/26/2018 by Dr. Anne FuSkains on 11/26/2018 TRANSESOPHAGEAL ECHOCARDIOGRAM (TEE)  On 11/30/2018 he underwent CABG x 3 and MVR by Dr. Vickey SagesAtkins.  CORONARY ARTERY BYPASS GRAFTING (CABG) x Three , using left internal mammary artery and left leg greater saphenous vein harvested endoscopically N/A General  MITRAL VALVE REPLACEMENT (MVR) USING MAGNA EASE SIZE 27mm     He was admitted to inpatient Rehabilitation on 12/11/2018 and discharged home on 12/25/2018. He is receiving [facility]. He denies pain. He rated his pain 0. Also reports his appetite is improving.   Caregiver in room.    Pain Inventory Average Pain 0 Pain Right Now 0 My pain is no pain  In the last 24 hours, has pain interfered with the following? General activity 0 Relation with others 0 Enjoyment of life 0 What TIME of day is your pain at its worst? no pain Sleep (in general) Good  Pain is worse with: no pain Pain improves with: no pain Relief from Meds: no pain  Mobility walk with assistance use a  walker  Function disabled: date disabled .  Neuro/Psych No problems in this area  Prior Studies transitional care  Physicians involved in your care transitional care   Family History  Problem Relation Age of Onset  . Heart attack Father   . Heart attack Mother    Social History   Socioeconomic History  . Marital status: Divorced    Spouse name: Not on file  . Number of children: Not on file  . Years of education: Not on file  . Highest education level: Not on file  Occupational History  . Not on file  Social Needs  . Financial resource strain: Not hard at all  . Food insecurity    Worry: Never true    Inability: Never true  . Transportation needs    Medical: No    Non-medical: No  Tobacco Use  . Smoking status: Current Some Day Smoker    Packs/day: 0.25    Types: Cigarettes  . Smokeless tobacco: Never Used  Substance and Sexual Activity  . Alcohol use: Never    Frequency: Never  . Drug use: Never  . Sexual activity: Not Currently  Lifestyle  . Physical activity    Days per week: 3 days    Minutes per session: 60 min  . Stress: Not at all  Relationships  . Social connections    Talks on phone: More than three times a week    Gets together: Never    Attends religious service: Never    Active member of  club or organization: Yes    Attends meetings of clubs or organizations: Never    Relationship status: Divorced  Other Topics Concern  . Not on file  Social History Narrative  . Not on file   Past Surgical History:  Procedure Laterality Date  . CORONARY ARTERY BYPASS GRAFT N/A 11/30/2018   Procedure: MEDIASTINAL POST-OP BLEED;  Surgeon: Wonda Olds, MD;  Location: [place];  Service: Open Heart Surgery;  Laterality: N/A;  . CORONARY ARTERY BYPASS GRAFT N/A 11/30/2018   Procedure: CORONARY ARTERY BYPASS GRAFTING (CABG) x Three , using left internal mammary artery and left leg greater saphenous vein harvested endoscopically;  Surgeon: Wonda Olds, MD;  Location: [place];  Service: Open Heart Surgery;  Laterality: N/A;  . LEFT HEART CATH AND CORONARY ANGIOGRAPHY N/A 11/24/2018   Procedure: LEFT HEART CATH AND CORONARY ANGIOGRAPHY;  Surgeon: Belva Crome, MD;  Location: [place] CV LAB;  Service: Cardiovascular;  Laterality: N/A;  . MITRAL VALVE REPAIR N/A 11/30/2018   Procedure: MITRAL VALVE REPLACEMENT (MVR) USING MAGNA EASE SIZE 14mm;  Surgeon: Wonda Olds, MD;  Location: [place];  Service: Open Heart Surgery;  Laterality: N/A;  . PLACEMENT OF IMPELLA LEFT VENTRICULAR ASSIST DEVICE N/A 11/30/2018   Procedure: PLACEMENT OF IMPELLA LEFT VENTRICULAR ASSIST DEVICE;  Surgeon: Wonda Olds, MD;  Location: [place];  Service: Open Heart Surgery;  Laterality: N/A;  . REMOVAL OF IMPELLA LEFT VENTRICULAR ASSIST DEVICE N/A 12/04/2018   Procedure: REMOVAL OF IMPELLA LEFT VENTRICULAR ASSIST DEVICE.  EVACUATION OF LEFT HEMOTHORAX;  Surgeon: Wonda Olds, MD;  Location: [place];  Service: Open Heart Surgery;  Laterality: N/A;  . STERNAL INCISION RECLOSURE  12/04/2018   Procedure: Sternal Plating with Sternal Rewiring;  Surgeon: Wonda Olds, MD;  [facility];  Service: Open Heart Surgery;;  . TEE WITHOUT CARDIOVERSION N/A 11/26/2018   Procedure: TRANSESOPHAGEAL ECHOCARDIOGRAM (TEE);  Surgeon: Jerline Pain, MD;  [facility]: [facility] ENDOSCOPY;  Service: Cardiovascular;  Laterality: N/A;  . TEE WITHOUT CARDIOVERSION N/A 11/30/2018   Procedure: TRANSESOPHAGEAL ECHOCARDIOGRAM (TEE);  Surgeon: Wonda Olds, MD;  Location: [place];  Service: Open Heart Surgery;  Laterality: N/A;  . TEE WITHOUT CARDIOVERSION N/A 12/04/2018   Procedure: TRANSESOPHAGEAL ECHOCARDIOGRAM (TEE);  Surgeon: Wonda Olds, MD;  Location: [place];  Service: Open Heart Surgery;  Laterality: N/A;   Past Medical History:  Diagnosis Date  . Coronary artery disease   . Elevated troponin 11/25/2018  . Tobacco use    BP 107/75   Pulse 99   Temp 97.7 F (36.5 C)   Ht 5'  9" (1.753 m)   Wt 174 lb (78.9 kg)   SpO2 96%   BMI 25.70 kg/m   Opioid Risk Score:   Fall Risk Score:  `1  Depression screen PHQ 2/9  Depression screen PHQ 2/9 01/06/2019  Decreased Interest 0  Down, Depressed, Hopeless 0  PHQ - 2 Score 0  Altered sleeping 0  Tired, decreased energy 0  Change in appetite 0  Feeling bad or failure about yourself  0  Trouble concentrating 0  Moving slowly or fidgety/restless 0  Suicidal thoughts 0  PHQ-9 Score 0  Difficult doing work/chores Not difficult at all    Review of Systems  Constitutional: Negative.   HENT: Negative.   Eyes: Negative.   Respiratory: Negative.   Cardiovascular: Negative.   Gastrointestinal: Negative.   Endocrine: Negative.   Genitourinary: Negative.   Musculoskeletal: Negative.  Skin: Negative.   Allergic/Immunologic: Negative.   Neurological: Negative.   Hematological: Negative.   Psychiatric/Behavioral: Negative.   All other systems reviewed and are negative.      Objective:   Physical Exam Constitutional:      Appearance: Normal appearance.  Neck:     Musculoskeletal: Normal range of motion and neck supple.  Cardiovascular:     Rate and Rhythm: Normal rate and regular rhythm.     Pulses: Normal pulses.     Heart sounds: Normal heart sounds.  Pulmonary:     Effort: Pulmonary effort is normal.     Breath sounds: Normal breath sounds.  Musculoskeletal:     Comments: Normal Muscle Bulk and Muscle Testing Reveals:  Upper Extremities: Full ROM and Muscle Strength 5/5 Sternal Surgical Site with scabbed areas, no drainage or odor Lower Extremities: Full ROM and Muscle Strength 5/5 Arises from Table with ease using walker for support Narrow Based  Gait   Skin:    General: Skin is warm and dry.  Neurological:     Mental Status: He is alert and oriented to person, place, and time.  Psychiatric:        Mood and Affect: Mood normal.        Behavior: Behavior normal.           Assessment &  Plan:  1.Debility: Continue outpatient therapy with [facility]. 2. S/P CABG x3, S/P MVRL Cardiothoracic Following. Continue to Monitor.  3. Chronic combined systolic and diastolic congestive heart failure. Continue current medication regimen. Cardiology Following.   20 minutes of face to face patient care time was spent during this visit. All questions were encouraged and answered.  F/U in 4- 6 weeks with Dr Allena Katz

## 2019-01-07 ENCOUNTER — Telehealth: Payer: Self-pay | Admitting: Physician Assistant

## 2019-01-07 NOTE — Telephone Encounter (Signed)
Will forward [facility] PA for recommendations as we have not seen pt in a outpatient setting at this time.Pt has appt 01/14/19 at 11:30 am./cy

## 2019-01-07 NOTE — Telephone Encounter (Signed)
Army Melia, [facility] would like BP parameters   Justice Rocher can be reached at [phone].

## 2019-01-08 ENCOUNTER — Other Ambulatory Visit: Payer: Self-pay

## 2019-01-08 ENCOUNTER — Ambulatory Visit (INDEPENDENT_AMBULATORY_CARE_PROVIDER_SITE_OTHER): Payer: Self-pay | Admitting: Cardiothoracic Surgery

## 2019-01-08 ENCOUNTER — Ambulatory Visit
Admission: RE | Admit: 2019-01-08 | Discharge: 2019-01-08 | Disposition: A | Discharge status: Home / Self Care | Payer: Medicare Other | Source: Ambulatory Visit | Attending: Cardiothoracic Surgery | Admitting: Cardiothoracic Surgery

## 2019-01-08 VITALS — BP 125/74 | HR 94 | Temp 97.7°F | Resp 20 | Ht 69.0 in | Wt 172.8 lb

## 2019-01-08 DIAGNOSIS — I5020 Unspecified systolic (congestive) heart failure: Secondary | ICD-10-CM

## 2019-01-08 DIAGNOSIS — Z951 Presence of aortocoronary bypass graft: Secondary | ICD-10-CM

## 2019-01-14 ENCOUNTER — Other Ambulatory Visit: Payer: Self-pay

## 2019-01-14 ENCOUNTER — Ambulatory Visit (INDEPENDENT_AMBULATORY_CARE_PROVIDER_SITE_OTHER): Payer: Medicare Other | Admitting: Physician Assistant

## 2019-01-14 ENCOUNTER — Encounter: Payer: Self-pay | Admitting: Physician Assistant

## 2019-01-14 VITALS — BP 113/75 | HR 99 | Temp 98.1°F | Ht 69.0 in | Wt 175.4 lb

## 2019-01-14 DIAGNOSIS — Z720 Tobacco use: Secondary | ICD-10-CM | POA: Diagnosis not present

## 2019-01-14 DIAGNOSIS — I25810 Atherosclerosis of coronary artery bypass graft(s) without angina pectoris: Secondary | ICD-10-CM

## 2019-01-14 DIAGNOSIS — I952 Hypotension due to drugs: Secondary | ICD-10-CM

## 2019-01-14 DIAGNOSIS — Z79899 Other long term (current) drug therapy: Secondary | ICD-10-CM | POA: Diagnosis not present

## 2019-01-14 DIAGNOSIS — I255 Ischemic cardiomyopathy: Secondary | ICD-10-CM | POA: Diagnosis not present

## 2019-01-14 DIAGNOSIS — R7303 Prediabetes: Secondary | ICD-10-CM

## 2019-01-14 DIAGNOSIS — I480 Paroxysmal atrial fibrillation: Secondary | ICD-10-CM

## 2019-01-14 DIAGNOSIS — Z952 Presence of prosthetic heart valve: Secondary | ICD-10-CM

## 2019-01-14 DIAGNOSIS — E785 Hyperlipidemia, unspecified: Secondary | ICD-10-CM | POA: Diagnosis not present

## 2019-01-14 MED ORDER — LOSARTAN POTASSIUM 25 MG PO TABS: 25.0000 mg | ORAL_TABLET | Freq: Every day | ORAL | 0 refills | Status: DC

## 2019-01-14 NOTE — Progress Notes (Signed)
Cardiology Office Note:    Date:  01/16/2019   ID:  Curtis [name], DOB 10/26/1950, MRN [id number]  PCP:  Patient, No Pcp Per  Cardiologist:  Chrystie Nose, MD  Electrophysiologist:  None   Referring MD: No ref. provider found   Chief Complaint  Patient presents with   Hospitalization Follow-up    seen for Dr. Rennis Golden.     History of Present Illness:    Curtis [name] is a 68 y.o. male with a hx of tobacco abuse, prediabetes, CAD s/p CABG, and mitral valve repair.  Patient was initially seen on 11/23/2018 for shortness of breath.  He was hypoxic on presentation and required supplemental oxygen.  On arrival, he was also in narrow complex tachycardia with heart rate of 140 and was given adenosine.  He subsequently slows down to a sinus tachycardia with left bundle branch block.  Initial troponin was 63, subsequent troponin trended up to 4477 then 9016.  Given new left bundle branch block, there was high suspicion of ACS.  Catheterization performed on 11/24/2018 showed 30 to 30-40% left main disease, 50% proximal LAD followed by 60 to 70% proximal LAD lesion, severe disease in proximal D1, 85% mid to distal left circumflex lesion, 99% disease in his distal left circumflex, totally occluded codominant RCA with right to right and left-to-right collaterals, EF 25 to 30%, LVEDP 16 mmHg.  Echocardiogram also confirms EF of 20 to 25% with concern of moderate to severe MR.  Subsequent TEE demonstrated at least moderate mitral regurgitation.  Patient eventually underwent CABG x3 with LIMA to distal LAD, SVG to OM, SVG to diagonal and bioprosthetic MVR by Dr. Vickey Sages on 11/30/2018.  Postop course was complicated by cardiogenic shock in the setting of severe ischemic cardiomyopathy and postop bleeding and hemorrhagic shock.  Patient was placed on Impella for several days along with milrinone and pressure.  Impella was removed on 10/2 with left hemothorax evacuation.  Postop, he was placed on Entresto and spironolactone  for afterload reduction.  He was also placed on amiodarone for transient postop atrial fibrillation.  He was discharged from CIR on 10/23.    Prior to his cardiology follow-up, he returned back to the hospital on 01/01/2019 with near syncope.  EMS was called and he was found to have a blood pressure of 84/62.  His creatinine trended up to 1.87 from the previous 1.2.  White blood cell count was elevated to 14.4.  Blood pressure medication were held and Entresto 24-26 mg twice daily was eventually restarted.  Torsemide was discontinued and it was recommended to restart spironolactone at low-dose and consider torsemide only if he become volume overloaded.  Unfortunately, blood pressure dropped again with reinitiation of Entresto.  Repeat echocardiogram obtained during the admission showed EF 20 to 25%, stable bioprosthetic mitral valve.  Patient presents today for cardiology office visit.  He denies any chest pain or worsening shortness of breath.  He does have sternotomy site soreness.  We discussed the importance of reinitiating heart failure therapy given his ischemic cardiomyopathy.  He was agreeable to start losartan 25 mg daily however hesitant to try Entresto again.  I plan to bring the patient back in a few weeks and potentially add spironolactone at that time.  I recommended him to finish the current course of amiodarone then stop after that.  Otherwise he does not appears to be volume overloaded.   Past Medical History:  Diagnosis Date   Coronary artery disease    Elevated troponin 11/25/2018  Tobacco use     Past Surgical History:  Procedure Laterality Date   CORONARY ARTERY BYPASS GRAFT N/A 11/30/2018   Procedure: MEDIASTINAL POST-OP BLEED;  Surgeon: Linden Dolin, MD;  [facility];  Service: Open Heart Surgery;  Laterality: N/A;   CORONARY ARTERY BYPASS GRAFT N/A 11/30/2018   Procedure: CORONARY ARTERY BYPASS GRAFTING (CABG) x Three , using left internal mammary artery and  left leg greater saphenous vein harvested endoscopically;  Surgeon: Linden Dolin, MD;  [facility];  Service: Open Heart Surgery;  Laterality: N/A;   LEFT HEART CATH AND CORONARY ANGIOGRAPHY N/A 11/24/2018   Procedure: LEFT HEART CATH AND CORONARY ANGIOGRAPHY;  Surgeon: Lyn Records, MD;  [facility];  Service: Cardiovascular;  Laterality: N/A;   MITRAL VALVE REPAIR N/A 11/30/2018   Procedure: MITRAL VALVE REPLACEMENT (MVR) USING MAGNA EASE SIZE 27mm;  Surgeon: Linden Dolin, MD;  [facility];  Service: Open Heart Surgery;  Laterality: N/A;   PLACEMENT OF IMPELLA LEFT VENTRICULAR ASSIST DEVICE N/A 11/30/2018   Procedure: PLACEMENT OF IMPELLA LEFT VENTRICULAR ASSIST DEVICE;  Surgeon: Linden Dolin, MD;  [facility];  Service: Open Heart Surgery;  Laterality: N/A;   REMOVAL OF IMPELLA LEFT VENTRICULAR ASSIST DEVICE N/A 12/04/2018   Procedure: REMOVAL OF IMPELLA LEFT VENTRICULAR ASSIST DEVICE.  EVACUATION OF LEFT HEMOTHORAX;  Surgeon: Linden Dolin, MD;  [facility];  Service: Open Heart Surgery;  Laterality: N/A;   STERNAL INCISION RECLOSURE  12/04/2018   Procedure: Sternal Plating with Sternal Rewiring;  Surgeon: Linden Dolin, MD;  [facility];  Service: Open Heart Surgery;;   TEE WITHOUT CARDIOVERSION N/A 11/26/2018   Procedure: TRANSESOPHAGEAL ECHOCARDIOGRAM (TEE);  Surgeon: Jake Bathe, MD;  Location: [facility] ENDOSCOPY;  Service: Cardiovascular;  Laterality: N/A;   TEE WITHOUT CARDIOVERSION N/A 11/30/2018   Procedure: TRANSESOPHAGEAL ECHOCARDIOGRAM (TEE);  Surgeon: Linden Dolin, MD;  Location: [facility] OR;  Service: Open Heart Surgery;  Laterality: N/A;   TEE WITHOUT CARDIOVERSION N/A 12/04/2018   Procedure: TRANSESOPHAGEAL ECHOCARDIOGRAM (TEE);  Surgeon: Linden Dolin, MD;  Location: [facility] OR;  Service: Open Heart Surgery;  Laterality: N/A;    Current Medications: Current Meds  Medication Sig   amiodarone (PACERONE) 200 MG tablet  Take 1 tablet (200 mg total) by mouth 2 (two) times daily. For 5 days then take Amiodarone 200 mg daily thereafter (Patient taking differently: Take 200 mg by mouth daily. )   aspirin EC 325 MG EC tablet Take 1 tablet (325 mg total) by mouth daily.   guaiFENesin (MUCINEX) 600 MG 12 hr tablet Take 1 tablet (600 mg total) by mouth 2 (two) times daily. (Patient taking differently: Take 600 mg by mouth daily. )   Multiple Vitamin (MULTIVITAMIN WITH MINERALS) TABS tablet Take 1 tablet by mouth daily.   pantoprazole (PROTONIX) 40 MG tablet Take 1 tablet (40 mg total) by mouth daily.   rosuvastatin (CRESTOR) 40 MG tablet Take 1 tablet (40 mg total) by mouth daily at 6 PM.   torsemide (DEMADEX) 10 MG tablet Take 1 tablet (10 mg total) by mouth daily as needed (For weight gain 3 pounds or more in 1 day or 5 pounds or more in 3 days).     Allergies:   Penicillins   Social History   Socioeconomic History   Marital status: Divorced    Spouse name: Not on file   Number of children: Not on file   Years of education:  Not on file   Highest education level: Not on file  Occupational History   Not on file  Social Needs   Financial resource strain: Not hard at all   Food insecurity    Worry: Never true    Inability: Never true   Transportation needs    Medical: No    Non-medical: No  Tobacco Use   Smoking status: Current Some Day Smoker    Packs/day: 0.25    Types: Cigarettes   Smokeless tobacco: Never Used  Substance and Sexual Activity   Alcohol use: Never    Frequency: Never   Drug use: Never   Sexual activity: Not Currently  Lifestyle   Physical activity    Days per week: 3 days    Minutes per session: 60 min   Stress: Not at all  Relationships   Social connections    Talks on phone: More than three times a week    Gets together: Never    Attends religious service: Never    Active member of club or organization: Yes    Attends meetings of clubs or  organizations: Never    Relationship status: Divorced  Other Topics Concern   Not on file  Social History Narrative   Not on file     Family History: The patient's family history includes Heart attack in his father and mother.  ROS:   Please see the history of present illness.     All other systems reviewed and are negative.  EKGs/Labs/Other Studies Reviewed:    The following studies were reviewed today:  Cath 11/24/2018  Left heart cath via right radial using real-time vascular ultrasound for access.  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  Totally occluded codominant right coronary supplied by right to right and left to right collaterals.  PDA is diffusely diseased.  Just after the origin from the right coronary the PDA may be graftable.  Tubular 30 to 40% left main.  50% proximal LAD followed by 60 to 70% proximal LAD.  First diagonal with severe proximal disease  Codominant circumflex with occlusion of the first marginal, small second and third marginals, 85% mid to distal circumflex before the large branching fourth obtuse marginal.  Distal circumflex is then followed by 99% stenosis proximal to the circumflex PDA/left ventricular branch (likely too small to graft.  By echo LVEF is 25 to 30%.  LVEDP is 16 mmHg.  Findings consistent with compensated acute on chronic systolic and diastolic heart failure.  The patient has had a rapid turnaround from acute pulmonary edema.  Complex anatomy.  Refer for surgical consultation to determine if he is a candidate for multivessel bypass.    EKG:  EKG is ordered today.  The ekg ordered today demonstrates normal sinus rhythm with left bundle branch block, heart rate borderline elevated at 99.  Recent Labs: 11/23/2018: B Natriuretic Peptide 507.9 11/24/2018: TSH 0.350 01/01/2019: ALT 25 01/02/2019: Magnesium 1.7 01/03/2019: Hemoglobin 12.1; Platelets 232 01/04/2019: BUN 14; Creatinine, Ser 1.24;  Potassium 4.4; Sodium 133  Recent Lipid Panel    Component Value Date/Time   CHOL 233 (H) 11/25/2018 0834   TRIG 155 (H) 11/25/2018 0834   HDL 31 (L) 11/25/2018 0834   CHOLHDL 7.5 11/25/2018 0834   VLDL 31 11/25/2018 0834   LDLCALC 171 (H) 11/25/2018 0834    Physical Exam:    VS:  BP 113/75    Pulse 99    Temp 98.1 F (36.7 C)  Ht  (1.753 m)    Wt 175 lb 6.4 oz (79.6 kg)    SpO2 96%    BMI 25.90 kg/m     Wt Readings from Last 3 Encounters:  01/14/19 175 lb 6.4 oz (79.6 kg)  01/08/19 172 lb 12.8 oz (78.4 kg)  01/06/19 174 lb (78.9 kg)     GEN:  Well nourished, well developed in no acute distress HEENT: Normal NECK: No JVD; No carotid bruits LYMPHATICS: No lymphadenopathy CARDIAC: RRR, no murmurs, rubs, gallops.  Sternotomy site well-healed. RESPIRATORY:  Clear to auscultation without rales, wheezing or rhonchi  ABDOMEN: Soft, non-tender, non-distended MUSCULOSKELETAL:  No edema; No deformity  SKIN: Warm and dry NEUROLOGIC:  Alert and oriented x 3 PSYCHIATRIC:  Normal affect   ASSESSMENT:    1. Coronary artery disease involving coronary bypass graft of native heart without angina pectoris   2. Hyperlipidemia, unspecified hyperlipidemia type   3. Medication management   4. Tobacco abuse   5. Prediabetes   6. S/P MVR (mitral valve replacement)   7. Ischemic cardiomyopathy   8. Hypotension due to drugs    PLAN:    In order of problems listed above:  1. CAD s/p CABG: Continue high-dose aspirin.  2. Status post MVR: No significant heart murmur on physical exam.  He does not appear to be volume overloaded  3. Ischemic cardiomyopathy: EF 25%.  He was unable to tolerate Entresto due to hypotension, I managed to convince him to start on losartan 25 mg daily.  I will bring him back in 2 weeks, if blood pressure stable, I will try to add spironolactone to his medical regimen.  Once heart failure medication is fully titrated, he will need a 30-month repeat  echocardiogram  4. Postop atrial fibrillation: No recurrence.  Finish the current course of amiodarone then stop  5. Hypotension: Resolved.  Obtain CBC and CMP  6. Hyperlipidemia: Continue statin therapy  7. Prediabetes: We will defer this to primary care provider  8. Tobacco abuse: Tobacco cessation strongly recommended   Medication Adjustments/Labs and Tests Ordered: Current medicines are reviewed at length with the patient today.  Concerns regarding medicines are outlined above.  Orders Placed This Encounter  Procedures   CBC   Comprehensive metabolic panel   Lipid panel   EKG 12-Lead   Meds ordered this encounter  Medications   losartan (COZAAR) 25 MG tablet    Sig: Take 1 tablet (25 mg total) by mouth daily.    Dispense:  30 tablet    Refill:  0    Patient Instructions  Medication Instructions:   START LOSARTAN 25 MG DAILY  FINISH TAKING AMIODARONE THEN DISCONTINUE AFTER  *If you need a refill on your cardiac medications before your next appointment, please call your pharmacy*  Lab Work: You will need to have labs (blood work) drawn in 2 weeks when you return for your follow-up:  CBC  CMET  Fasting Lipid Panel-DO NOT EAT OR DRINK PAST MIDNIGHT If you have labs (blood work) drawn today and your tests are completely normal, you will receive your results only by:  MyChart Message (if you have MyChart) OR  A paper copy in the mail If you have any lab test that is abnormal or we need to change your treatment, we will call you to review the results.  Testing/Procedures:  NONE ordered at this time of appointment   Follow-Up: At [facility], you and your health needs are our priority.  As part  of our continuing mission to provide you with exceptional heart care, we have created designated Provider Care Teams.  These Care Teams include your primary Cardiologist (physician) and Advanced Practice Providers (APPs -  Physician Assistants and Nurse  Practitioners) who all work together to provide you with the care you need, when you need it.  Your next appointment:   2 WEEKS   The format for your next appointment:   In Person  Provider:   Almyra Deforest, PA-C  Other Instructions      Signed, Almyra Deforest, [place]  01/16/2019 10:27 PM    [place]

## 2019-01-14 NOTE — Patient Instructions (Addendum)
Medication Instructions:   START LOSARTAN 25 MG DAILY  FINISH TAKING AMIODARONE THEN DISCONTINUE AFTER  *If you need a refill on your cardiac medications before your next appointment, please call your pharmacy*  Lab Work: You will need to have labs (blood work) drawn in 2 weeks when you return for your follow-up:  CBC  CMET  Fasting Lipid Panel-DO NOT EAT OR DRINK PAST MIDNIGHT If you have labs (blood work) drawn today and your tests are completely normal, you will receive your results only by: Marland Kitchen MyChart Message (if you have MyChart) OR . A paper copy in the mail If you have any lab test that is abnormal or we need to change your treatment, we will call you to review the results.  Testing/Procedures:  NONE ordered at this time of appointment   Follow-Up: At [facility], you and your health needs are our priority.  As part of our continuing mission to provide you with exceptional heart care, we have created designated Provider Care Teams.  These Care Teams include your primary Cardiologist (physician) and Advanced Practice Providers (APPs -  Physician Assistants and Nurse Practitioners) who all work together to provide you with the care you need, when you need it.  Your next appointment:   2 WEEKS   The format for your next appointment:   In Person  Provider:   Almyra Deforest, PA-C  Other Instructions

## 2019-01-16 ENCOUNTER — Encounter: Payer: Self-pay | Admitting: Physician Assistant

## 2019-01-18 NOTE — Progress Notes (Signed)
      [address]       ,[place] [address]             [phone]     CARDIOTHORACIC SURGERY OFFICE NOTE  Referring Provider is Lacretia Leigh, MD Primary Cardiologist is Pixie Casino, MD PCP is Patient, No Pcp Per   HPI:  68 yo man presents for initial outpatient visit after recent CABg/mitral replacement for low EF and CAD. This was supported by Impella perioperatively. He ultimately did well and presented to rehab. Now at home and doing well. No complaints of chest pain or shortness of breath.    Current Outpatient Medications  Medication Sig Dispense Refill  . amiodarone (PACERONE) 200 MG tablet Take 1 tablet (200 mg total) by mouth 2 (two) times daily. For 5 days then take Amiodarone 200 mg daily thereafter (Patient taking differently: Take 200 mg by mouth daily. ) 60 tablet 0  . aspirin EC 325 MG EC tablet Take 1 tablet (325 mg total) by mouth daily. 30 tablet 0  . guaiFENesin (MUCINEX) 600 MG 12 hr tablet Take 1 tablet (600 mg total) by mouth 2 (two) times daily. (Patient taking differently: Take 600 mg by mouth daily. ) 60 tablet 0  . Multiple Vitamin (MULTIVITAMIN WITH MINERALS) TABS tablet Take 1 tablet by mouth daily.    . pantoprazole (PROTONIX) 40 MG tablet Take 1 tablet (40 mg total) by mouth daily. 30 tablet 0  . rosuvastatin (CRESTOR) 40 MG tablet Take 1 tablet (40 mg total) by mouth daily at 6 PM. 30 tablet 0  . torsemide (DEMADEX) 10 MG tablet Take 1 tablet (10 mg total) by mouth daily as needed (For weight gain 3 pounds or more in 1 day or 5 pounds or more in 3 days). 30 tablet 0  . losartan (COZAAR) 25 MG tablet Take 1 tablet (25 mg total) by mouth daily. 30 tablet 0   No current facility-administered medications for this visit.       Physical Exam:   BP 125/74 (BP Location: Right Arm)   Pulse 94   Temp 97.7 F (36.5 C) (Skin)   Resp 20   Ht 5\' 9"  (1.753 m)   Wt 78.4 kg   SpO2 95% Comment: RA  BMI 25.52 kg/m   General:   Well-appearing, NAD  Chest:   cta  CV:   rrr  Incisions:  C/d/i  Abdomen:  sntnd  Extremities:  No edema  Diagnostic Tests: CXR with clear lung fields   Impression:  Doing remarkably well after CABG-MVR  Plan:  F/u as needed with thoracic surgery F/u with Cardiology in next few weeks; refer to advanced heart failure  I spent in excess of 15 minutes during the conduct of this office consultation and >50% of this time involved direct face-to-face encounter with the patient for counseling and/or coordination of their care.  Level 2                 10 minutes Level 3                 15 minutes Level 4                 25 minutes Level 5                 40 minutes  B. Murvin Natal, MD 01/18/2019 7:12 AM

## 2019-01-25 NOTE — Progress Notes (Signed)
Cardiology Office Note   Date:  01/27/2019   ID:  Curtis [name], DOB 1950-05-29, MRN [id number]  PCP:  Patient, No Pcp Per  Cardiologist:  Dr. Debara Pickett  FX:OVANVB Up   History of Present Illness: Curtis [name] is a 68 y.o. male who presents for ongoing assessment and management of coronary artery disease, status post CABG and mitral valve repair, history of narrow complex tachycardia, left bundle branch block.  Cardiac catheterization performed 11/24/2018 revealed 30% to 40% left [name] disease, 50% proximal LAD, followed by a 60% to 70% proximal LAD lesion, severe disease in the proximal diagonal 1, 85% mid to distal left circumflex lesion, 99% disease in his left distal circumflex, totally occluded codominant RCA with right to right and left-to-right collaterals.  EF was 25% to 30%.  CABG was performed with LIMA to LAD, SVG to OM, SVG to diagonal and bioprosthetic MVR completed on 11/30/2018.  He did have a complicated postoperative course with cardiogenic shock in the setting of severe ischemic cardiomyopathy and postop bleeding along with hemorrhagic shock.  He was placed on Impella for several days along with milrinone.  He also had a left hemothorax evacuation.  He was placed on Entresto and spironolactone for afterload reduction and amiodarone for transient postoperative atrial fibrillation.  He returned to the hospital 01/01/2019 with near syncope, and found to be hypotensive with a blood pressure of 84/62.  Torsemide was discontinued, along with Entresto and spironolactone, and to use torsemide as needed for volume overload.  Repeat echocardiogram revealed EF of 20% to 25% with a stable bioprosthetic valve.    When last seen in the office on 01/14/2019 by Curtis Deforest, PA, he was reluctant to restart Entresto but was agreeable to start losartan 25 mg daily.  He is here for follow-up concerning his response to medication and to try additional spironolactone to optimize his medical regimen.  He was to finish  his current dose of amiodarone and then stop.   He fell to his knees when going up the stairs last evening and didn't tell anyone until this am.  He states that he did not pass out, just his legs gave out from under him and he went to his knees at the 10th stair and crawled the rest of the way up.  We are checking orthostatic BP this am.   He has no idea what medications he is taking, and the nurses aide who is with him does not have a list of his medicines or is aware of what he is taking.  We have tried to call a second nurses aide who fills his medication tray and she is unaware what he is taking as well.  He states he has not taken his medicine yet today.  He has gained approximately 10 pounds since being seen last with a weight of 188 pounds, with a dry weight around 175 pounds.  He has not been adherent to low-sodium diet, eating pizza, and cured meat sandwiches.  He states she does not like taking diuretics.  Past Medical History:  Diagnosis Date   Coronary artery disease    Elevated troponin 11/25/2018   Tobacco use     Past Surgical History:  Procedure Laterality Date   CORONARY ARTERY BYPASS GRAFT N/A 11/30/2018   Procedure: MEDIASTINAL POST-OP BLEED;  Surgeon: Wonda Olds, MD;  [facility];  Service: Open Heart Surgery;  Laterality: N/A;   CORONARY ARTERY BYPASS GRAFT N/A 11/30/2018   Procedure: CORONARY ARTERY BYPASS GRAFTING (CABG)  x Three , using left internal mammary artery and left leg greater saphenous vein harvested endoscopically;  Surgeon: Wonda Olds, MD;  Location: [facility];  Service: Open Heart Surgery;  Laterality: N/A;   LEFT HEART CATH AND CORONARY ANGIOGRAPHY N/A 11/24/2018   Procedure: LEFT HEART CATH AND CORONARY ANGIOGRAPHY;  Surgeon: Belva Crome, MD;  Location: [facility];  Service: Cardiovascular;  Laterality: N/A;   MITRAL VALVE REPAIR N/A 11/30/2018   Procedure: MITRAL VALVE REPLACEMENT (MVR) USING MAGNA EASE SIZE 5m;  Surgeon:  AWonda Olds MD;  [facility]  Service: Open [facility] Surgery;  Laterality: N/A;   PLACEMENT OF IMPELLA LEFT VENTRICULAR ASSIST DEVICE N/A 11/30/2018   Procedure: PLACEMENT OF IMPELLA LEFT VENTRICULAR ASSIST DEVICE;  Surgeon: AWonda Olds MD;  [facility]  Service: Open Heart Surgery;  Laterality: N/A;   REMOVAL OF IMPELLA LEFT VENTRICULAR ASSIST DEVICE N/A 12/04/2018   Procedure: REMOVAL OF IMPELLA LEFT VENTRICULAR ASSIST DEVICE.  EVACUATION OF LEFT HEMOTHORAX;  Surgeon: AWonda Olds MD;  [facility]  Service: [facility] Surgery;  Laterality: N/A;   STERNAL INCISION RECLOSURE  12/04/2018   Procedure: Sternal Plating with Sternal Rewiring;  Surgeon: AWonda Olds MD;  [facility]  Service: Open Heart Surgery;;   TEE WITHOUT CARDIOVERSION N/A 11/26/2018   Procedure: TRANSESOPHAGEAL ECHOCARDIOGRAM (TEE);  Surgeon: SJerline Pain MD;  [facility]  Service: Cardiovascular;  Laterality: N/A;   TEE WITHOUT CARDIOVERSION N/A 11/30/2018   Procedure: TRANSESOPHAGEAL ECHOCARDIOGRAM (TEE);  Surgeon: AWonda Olds MD;  [facility]  Service: Open Heart Surgery;  Laterality: N/A;   TEE WITHOUT CARDIOVERSION N/A 12/04/2018   Procedure: TRANSESOPHAGEAL ECHOCARDIOGRAM (TEE);  Surgeon: AWonda Olds MD;  [facility]  Service: Open Heart Surgery;  Laterality: N/A;     Current Outpatient Medications  Medication Sig Dispense Refill   amiodarone (PACERONE) 200 MG tablet Take 1 tablet (200 mg total) by mouth 2 (two) times daily. For 5 days then take Amiodarone 200 mg daily thereafter (Patient taking differently: Take 200 mg by mouth daily. ) 60 tablet 0   aspirin EC 325 MG EC tablet Take 1 tablet (325 mg total) by mouth daily. 30 tablet 0   guaiFENesin (MUCINEX) 600 MG 12 hr tablet Take 1 tablet (600 mg total) by mouth 2 (two) times daily. (Patient taking differently: Take 600 mg by mouth daily. ) 60 tablet 0   losartan (COZAAR) 25 MG tablet  Take 1 tablet (25 mg total) by mouth daily. 30 tablet 0   Multiple Vitamin (MULTIVITAMIN WITH MINERALS) TABS tablet Take 1 tablet by mouth daily.     pantoprazole (PROTONIX) 40 MG tablet Take 1 tablet (40 mg total) by mouth daily. 30 tablet 0   rosuvastatin (CRESTOR) 40 MG tablet Take 1 tablet (40 mg total) by mouth daily at 6 PM. 30 tablet 0   torsemide (DEMADEX) 10 MG tablet Take 1 tablet (10 mg total) by mouth daily as needed (For weight gain 3 pounds or more in 1 day or 5 pounds or more in 3 days). 30 tablet 0   No current facility-administered medications for this visit.     Allergies:   Penicillins    Social History:  The patient  reports that he has been smoking cigarettes. He has been smoking about 0.25 packs per day. He has never used smokeless tobacco. He reports that he does not drink alcohol or use drugs.   Family  History:  The patient's family history includes Heart attack in his father and mother.    ROS: All other systems are reviewed and negative. Unless otherwise mentioned in H&P    PHYSICAL EXAM: VS:  BP (!) 144/78    Pulse 98    Temp (!) 97.3 F (36.3 C)    Ht '5\' 9"'$  (1.753 m)    Wt 188 lb 3.2 oz (85.4 kg)    SpO2 98%    BMI 27.79 kg/m  , BMI Body mass index is 27.79 kg/m. GEN: Well nourished, well developed, in no acute distress HEENT: normal Neck: no JVD, carotid bruits, or masses Cardiac: IRRR, tachycardic; no murmurs, rubs, or gallops, 2+ pitting edema in the feet and ankles.  Edema  Respiratory:  Clear to auscultation bilaterally, normal work of breathing GI: soft, nontender, nondistended, + BS MS: no deformity or atrophy Skin: warm and dry, no rash Neuro:  Strength and sensation are intact Psych: euthymic mood, full affect   EKG: Not completed this office visit. Recent Labs: 11/23/2018: B Natriuretic Peptide 507.9 11/24/2018: TSH 0.350 01/01/2019: ALT 25 01/02/2019: Magnesium 1.7 01/03/2019: Hemoglobin 12.1; Platelets 232 01/04/2019: BUN 14;  Creatinine, Ser 1.24; Potassium 4.4; Sodium 133    Lipid Panel    Component Value Date/Time   CHOL 233 (H) 11/25/2018 0834   TRIG 155 (H) 11/25/2018 0834   HDL 31 (L) 11/25/2018 0834   CHOLHDL 7.5 11/25/2018 0834   VLDL 31 11/25/2018 0834   LDLCALC 171 (H) 11/25/2018 0834      Wt Readings from Last 3 Encounters:  01/27/19 188 lb 3.2 oz (85.4 kg)  01/14/19 175 lb 6.4 oz (79.6 kg)  01/08/19 172 lb 12.8 oz (78.4 kg)      Other studies Reviewed: Cath 11/24/2018  Left heart cath via right radial using real-time vascular ultrasound for access.  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  Totally occluded codominant right coronary supplied by right to right and left to right collaterals. PDA is diffusely diseased. Just after the origin from the right coronary the PDA may be graftable.  Tubular 30 to 40% left [name].  50% proximal LAD followed by 60 to 70% proximal LAD.  First diagonal with severe proximal disease  Codominant circumflex with occlusion of the first marginal, small second and third marginals, 85% mid to distal circumflex before the large branching fourth obtuse marginal. Distal circumflex is then followed by 99% stenosis proximal to the circumflex PDA/left ventricular branch (likely too small to graft.  By echo LVEF is 25 to 30%. LVEDP is 26mHg. Findings consistent with compensated acute onchronic systolic and diastolic heart failure.The patient has had a rapid turnaround from acute pulmonary edema.  Complex anatomy. Refer for surgical consultation to determine if he is a candidate for multivessel bypass.  Echocardiogram 12/23/2018  1. Left ventricular ejection fraction, by visual estimation, is 20 to 25%. The left ventricle has severely decreased function. There is no left ventricular hypertrophy.  2. Abnormal septal motion consistent with left bundle branch block.  3. Global right ventricle has normal systolic function.The right  ventricular size is normal. No increase in right ventricular wall thickness.  4. Left atrial size was normal.  5. Right atrial size was normal.  6. 27 mm bioprosthetic vale is present in the mitral position.  7. The mitral valve has been repaired/replaced. No evidence of mitral valve regurgitation. No evidence of mitral stenosis.  8. The tricuspid valve is normal in structure. Tricuspid valve regurgitation  is not demonstrated.  9. The aortic valve is tricuspid. Aortic valve regurgitation is mild. No evidence of aortic valve sclerosis or stenosis. 10. The pulmonic valve was normal in structure. Pulmonic valve regurgitation is not visualized. 11. Normal pulmonary artery systolic pressure. 12. The inferior vena cava is normal in size with <50% respiratory variability, suggesting right atrial pressure of 8 mmHg. 13. MagnaEase 46m bioprosthetic valve is well seated and functioning normally. 14. There is residual posterior papillary muscle from native valve removal that is freely mobile. This is not a vegetation. Personally reviewed TEE intraoperatively and this finding was seen on final image.  ASSESSMENT AND PLAN:  1.  Ischemic cardiomyopathy: History of MI with reduced EF of 25%.  The patient is not on optimal medication therapy but has had some hypotension with Entresto.  He was started on losartan 25 mg daily, along with torsemide as needed for volume overload.  The patient has gained approximately 10 pounds from a dry weight of 173 pounds to a weight today of 188 pounds.  He is not taking diuretic, (that he is aware of as he does not know what medications he supposed to be on): He has a nurses aide who fills his medication tray and I am not certain whether or not he is taking torsemide or not.  I have asked him to begin taking torsemide 10 mg daily as directed not as needed until he has a weight down to 176 pounds.  I will not restart spironolactone at this time until I see what his blood pressure  response is she is on torsemide.  He was found to have mild orthostasis initially blood pressures normalized after 3 minutes of standing.  He was not symptomatic.  I am going to check a be met today to evaluate his kidney status.  On next office visit he is to bring his medications with him, and a list of medicines which she is taking at home for comparison to our own list.  He is advised on a low-salt diet, given a list of salted foods "salty 6" to take home with him.  2.  Coronary artery disease: Most recent cardiac catheterization revealed multivessel disease with subsequent CABG on November 30, 2018.  He denies any chest pain or dyspnea on exertion at present.  He will continue aspirin at 81 mg daily now.  3.  Postoperative atrial fibrillation: Uncertain if he is still taking amiodarone.  Usual course is for 3 months that he has no idea what medications he has each day.  He is not on any anticoagulation therapy.  He will need a follow-up EKG on next appointment.  4.  Hyperlipidemia: Continue on rosuvastatin.  Fasting lipids are being drawn today.  5.Bioprosthetic Mitral Valve: Continue current regimen.   6. Deconditioning: Legs weak when climbing stairs. Uses a walker for ambulation. Continue with PT.   Current medicines are reviewed at length with the patient today.    Labs/ tests ordered today include: Fasting lipids and LFTs, BMET.  KPhill Myron LWest Pugh ANP, [facility]  01/27/2019 10:18 AM    [facility] [address] Office [phone] [phone] Notice: This dictation was prepared with Dragon dictation along with smaller phrase technology. Any transcriptional errors that result from this process are unintentional and may not be corrected upon review.

## 2019-01-27 ENCOUNTER — Encounter: Payer: Self-pay | Admitting: Adult Health

## 2019-01-27 ENCOUNTER — Other Ambulatory Visit: Payer: Self-pay

## 2019-01-27 ENCOUNTER — Ambulatory Visit (INDEPENDENT_AMBULATORY_CARE_PROVIDER_SITE_OTHER): Payer: Medicare Other | Admitting: Adult Health

## 2019-01-27 VITALS — BP 144/78 | HR 98 | Temp 97.3°F | Ht 69.0 in | Wt 188.2 lb

## 2019-01-27 DIAGNOSIS — I5042 Chronic combined systolic (congestive) and diastolic (congestive) heart failure: Secondary | ICD-10-CM

## 2019-01-27 DIAGNOSIS — Z951 Presence of aortocoronary bypass graft: Secondary | ICD-10-CM

## 2019-01-27 DIAGNOSIS — E785 Hyperlipidemia, unspecified: Secondary | ICD-10-CM

## 2019-01-27 DIAGNOSIS — I255 Ischemic cardiomyopathy: Secondary | ICD-10-CM | POA: Diagnosis not present

## 2019-01-27 DIAGNOSIS — Z79899 Other long term (current) drug therapy: Secondary | ICD-10-CM | POA: Diagnosis not present

## 2019-01-27 DIAGNOSIS — I480 Paroxysmal atrial fibrillation: Secondary | ICD-10-CM

## 2019-01-27 LAB — COMPREHENSIVE METABOLIC PANEL
ALT: 27 IU/L (ref 0–44)
AST: 20 IU/L (ref 0–40)
Albumin/Globulin Ratio: 1.6 (ref 1.2–2.2)
Albumin: 3.9 g/dL (ref 3.8–4.8)
Alkaline Phosphatase: 107 IU/L (ref 39–117)
BUN/Creatinine Ratio: 10 (ref 10–24)
BUN: 11 mg/dL (ref 8–27)
Bilirubin Total: 0.7 mg/dL (ref 0.0–1.2)
CO2: 25 mmol/L (ref 20–29)
Calcium: 8.7 mg/dL (ref 8.6–10.2)
Chloride: 100 mmol/L (ref 96–106)
Creatinine, Ser: 1.06 mg/dL (ref 0.76–1.27)
GFR calc Af Amer: 84 mL/min/{1.73_m2} (ref >59)
GFR calc non Af Amer: 73 mL/min/{1.73_m2} (ref >59)
Globulin, Total: 2.5 g/dL (ref 1.5–4.5)
Glucose: 123 mg/dL — ABNORMAL HIGH (ref 65–99)
Potassium: 3.8 mmol/L (ref 3.5–5.2)
Sodium: 141 mmol/L (ref 134–144)
Total Protein: 6.4 g/dL (ref 6.0–8.5)

## 2019-01-27 LAB — LIPID PANEL
Chol/HDL Ratio: 2.7 ratio (ref 0.0–5.0)
Cholesterol, Total: 117 mg/dL (ref 100–199)
HDL: 44 mg/dL (ref >39)
LDL Chol Calc (NIH): 49 mg/dL (ref 0–99)
Triglycerides: 135 mg/dL (ref 0–149)
VLDL Cholesterol Cal: 24 mg/dL (ref 5–40)

## 2019-01-27 LAB — CBC
Hematocrit: 37.7 % (ref 37.5–51.0)
Hemoglobin: 12.6 g/dL — ABNORMAL LOW (ref 13.0–17.7)
MCH: 31.3 pg (ref 26.6–33.0)
MCHC: 33.4 g/dL (ref 31.5–35.7)
MCV: 94 fL (ref 79–97)
Platelets: 306 10*3/uL (ref 150–450)
RBC: 4.03 x10E6/uL — ABNORMAL LOW (ref 4.14–5.80)
RDW: 15 % (ref 11.6–15.4)
WBC: 10.5 10*3/uL (ref 3.4–10.8)

## 2019-01-27 NOTE — Patient Instructions (Signed)
Medication Instructions:  Curtis Sims, DNP has recommended making the following medication changes: 1. RESTART Torsemide 10 mg daily - IDEAL weight = 178 pounds  Please take medications prior to next office visit.  *If you need a refill on your cardiac medications before your next appointment, please call your pharmacy*  Lab Work: Your physician recommends that you return for lab work TODAY.  If you have labs (blood work) drawn today and your tests are completely normal, you will receive your results only by: Marland Kitchen MyChart Message (if you have MyChart) OR . A paper copy in the mail If you have any lab test that is abnormal or we need to change your treatment, we will call you to review the results.  Follow-Up: At [facility], you and your health needs are our priority.  As part of our continuing mission to provide you with exceptional heart care, we have created designated Provider Care Teams.  These Care Teams include your primary Cardiologist (physician) and Advanced Practice Providers (APPs -  Physician Assistants and Nurse Practitioners) who all work together to provide you with the care you need, when you need it.  Your next appointment:   2 week(s)  The format for your next appointment:   In Person  Provider:   You may see Pixie Casino, MD or one of the following Advanced Practice Providers on your designated Care Team:    Almyra Deforest, PA-C or  Curtis [name], [place]

## 2019-02-01 ENCOUNTER — Telehealth: Payer: Self-pay

## 2019-02-01 NOTE — Telephone Encounter (Signed)
Called the patient Curtis [name] to inform him of his lab results and to go over his medications. Care giver Curtis [name] answered and stated that I can speak with her. Upon looking at the patient's DPR she is not listed. I asked to speak with the patient. [name] gave verbal consent to speak with Curtis [name]. I informed Ms. Tamala Julian of the lab results and asked if we can go over the patient's medications. Will update patient's chart to reflect the medications patient is taking.

## 2019-02-01 NOTE — Progress Notes (Signed)
Updated patients medications

## 2019-02-01 NOTE — Addendum Note (Signed)
Addended by: Diana Eves on: 02/01/2019 11:27 AM   Modules accepted: Orders

## 2019-02-04 ENCOUNTER — Encounter: Payer: Medicare Other | Admitting: Physical Medicine & Rehabilitation

## 2019-02-18 NOTE — Progress Notes (Signed)
Cardiology Clinic Note   Patient Name: [name] Date of Encounter: 02/19/2019  Primary Care Provider:  Patient, [name]:  Chrystie Nose, MD  Patient Profile    Curtis [name] 68 year old male presents today for follow-up of his hypertension, coronary artery disease, and combined systolic and diastolic CHF.  Past Medical History    Past Medical History:  Diagnosis Date  . Coronary artery disease   . Elevated troponin 11/25/2018  . Tobacco use    Past Surgical History:  Procedure Laterality Date  . CORONARY ARTERY BYPASS GRAFT N/A 11/30/2018   Procedure: MEDIASTINAL POST-OP BLEED;  Surgeon: Linden Dolin, MD;  Location: [facility] OR;  Service: Open Heart Surgery;  Laterality: N/A;  . CORONARY ARTERY BYPASS GRAFT N/A 11/30/2018   Procedure: CORONARY ARTERY BYPASS GRAFTING (CABG) x Three , using left internal mammary artery and left leg greater saphenous vein harvested endoscopically;  Surgeon: Linden Dolin, MD;  Location: [facility] OR;  Service: Open Heart Surgery;  Laterality: N/A;  . LEFT HEART CATH AND CORONARY ANGIOGRAPHY N/A 11/24/2018   Procedure: LEFT HEART CATH AND CORONARY ANGIOGRAPHY;  Surgeon: Lyn Records, MD;  Location: [facility] INVASIVE CV LAB;  Service: Cardiovascular;  Laterality: N/A;  . MITRAL VALVE REPAIR N/A 11/30/2018   Procedure: MITRAL VALVE REPLACEMENT (MVR) USING MAGNA EASE SIZE 27mm;  Surgeon: Linden Dolin, MD;  Location: [facility] OR;  Service: Open Heart Surgery;  Laterality: N/A;  . PLACEMENT OF IMPELLA LEFT VENTRICULAR ASSIST DEVICE N/A 11/30/2018   Procedure: PLACEMENT OF IMPELLA LEFT VENTRICULAR ASSIST DEVICE;  Surgeon: Linden Dolin, MD;  Location: [facility] OR;  Service: Open Heart Surgery;  Laterality: N/A;  . REMOVAL OF IMPELLA LEFT VENTRICULAR ASSIST DEVICE N/A 12/04/2018   Procedure: REMOVAL OF IMPELLA LEFT VENTRICULAR ASSIST DEVICE.  EVACUATION OF LEFT HEMOTHORAX;  Surgeon: Linden Dolin, MD;  Location: [facility] OR;  Service: Open Heart  Surgery;  Laterality: N/A;  . STERNAL INCISION RECLOSURE  12/04/2018   Procedure: Sternal Plating with Sternal Rewiring;  Surgeon: Linden Dolin, MD;  Location: [facility] OR;  Service: Open Heart Surgery;;  . TEE WITHOUT CARDIOVERSION N/A 11/26/2018   Procedure: TRANSESOPHAGEAL ECHOCARDIOGRAM (TEE);  Surgeon: Jake Bathe, MD;  Location: [facility] ENDOSCOPY;  Service: Cardiovascular;  Laterality: N/A;  . TEE WITHOUT CARDIOVERSION N/A 11/30/2018   Procedure: TRANSESOPHAGEAL ECHOCARDIOGRAM (TEE);  Surgeon: Linden Dolin, MD;  Location: [facility] OR;  Service: Open Heart Surgery;  Laterality: N/A;  . TEE WITHOUT CARDIOVERSION N/A 12/04/2018   Procedure: TRANSESOPHAGEAL ECHOCARDIOGRAM (TEE);  Surgeon: Linden Dolin, MD;  Location: [facility] OR;  Service: Open Heart Surgery;  Laterality: N/A;    Allergies  Allergies  Allergen Reactions  . Penicillins Rash and Other (See Comments)    Tolerated cefepime 12/2018  Did it involve swelling of the face/tongue/throat, SOB, or low BP? Unknown Did it involve sudden or severe rash/hives, skin peeling, or any reaction on the inside of your mouth or nose? Yes Did you need to seek medical attention at a hospital or doctor's office? Unknown When did it last happen? childhood If all above answers are "NO", may proceed with cephalosporin use.     History of Present Illness    [name] has a past medical history of coronary artery disease status post CABG and mitral valve repair, history of narrow complex tachycardia, and left bundle branch block.  Cardiac catheterization on 11/24/2018 which revealed 30% to 40% left main disease, 50% proximal LAD, also further  distal 60 to 70% proximal LAD lesion, severe disease in proximal diagonal 1, 85% mid to distal left circumflex lesion, 99%  left circumflex, totally occluded codominant RCA with right to right and left to right collaterals.  EF 25 to 30%.  His CABG including LIMA to LAD, SVG to OM, SVG to diagonal and last  prosthetic MVR on 11/30/2018.  His postoperative course was complicated with cardiogenic shock in the setting of severe ischemic cardiomyopathy and postop bleeding along with hemorrhagic shock.  He was placed on Impella for several days along with milrinone.  He also had a left hemothorax evacuation.  He was placed on Entresto and spironolactone for afterload reduction and amiodarone for transient postoperative atrial fibrillation.  He was admitted to the hospital again on 01/01/2019 with near syncope and was found to be hypotensive with a blood pressure of 84/62.  His torsemide was discontinued along with his Entresto and spironolactone.  He was to use torsemide as needed for volume overload.  Repeat echocardiogram showed an LVEF of 20-25% with a stable bioprosthetic valve.  He was seen in the office on 01/14/2019 by Azalee CourseHao Meng, PA-C.  He was reluctant to restart Entresto but did however allow the start of losartan 25 mg daily.  Additional spironolactone was added to his regimen for optimization.  During that time he was instructed to finish his current dose of amiodarone then discontinue.  He was seen by Joni ReiningKathryn Lawrence, DNP on 01/27/2019.  During that time he indicated that he had fallen to his knees while going upstairs and had not disclosed the incident until that morning.  He indicated that he did not pass out and his legs became weak as he was climbing the stairs.  He was able to crawl the rest of the way to the top of the staircase.  Orthostatic blood pressures were checked at that time which showed mild orthostasis.  During his last office visit he was unaware of the medications he was taking.  He had a nurse aide with him at the time he was unaware of medicines he was taking and does not have a medication list.  On presentation to the clinic he had not taken his medicines yet that day.  He had gained approximately 10 pounds and his dry weight was approximately 175 pounds.  He did not adhere to a  low-sodium diet, and was eating pizza as well as deli meats.  He stated he did not like taking diuretics.  His torsemide was restarted at 10 mg daily.  He presents to the clinic today with his home health nurse and states he feels well.  He feels his breathing is much better.  He states he does not feel like he is back to 100% and is somewhat fatigued.  He continues to be active with physical therapy every other day for 1 hour and also does his physical therapy activities on half days for 1 hour as well.  He states he is active in his house as well.  He has been weighing daily and monitoring his diet.  His home health nurse fills his medication and is well informed about what he is taking today.  I will order a BMP today, add spironolactone 12.5 mg, and have him follow-up in 1 month.  The denies chest pain, shortness of breath, lower extremity edema,palpitations, melena, hematuria, hemoptysis, diaphoresis, weakness, presyncope, syncope, orthopnea, and PND.   Home Medications    Prior to Admission medications   Medication Sig Start Date  End Date Taking? Authorizing Provider  amiodarone (PACERONE) 200 MG tablet Take 1 tablet (200 mg total) by mouth 2 (two) times daily. For 5 days then take Amiodarone 200 mg daily thereafter Patient taking differently: Take 200 mg by mouth daily.  12/23/18   Angiulli, Mcarthur Rossetti, PA-C  aspirin EC 81 MG tablet Take 81 mg by mouth daily.    [provider]  guaiFENesin (MUCINEX) 600 MG 12 hr tablet Take 1 tablet (600 mg total) by mouth 2 (two) times daily. Patient taking differently: Take 600 mg by mouth daily.  12/23/18   Angiulli, Mcarthur Rossetti, PA-C  losartan (COZAAR) 50 MG tablet Take 50 mg by mouth daily.    [provider]  Multiple Vitamin (MULTIVITAMIN WITH MINERALS) TABS tablet Take 1 tablet by mouth daily. 12/12/18   Ardelle Balls, PA-C  pantoprazole (PROTONIX) 40 MG tablet Take 1 tablet (40 mg total) by mouth daily. 12/24/18   Angiulli,  Mcarthur Rossetti, PA-C  rosuvastatin (CRESTOR) 20 MG tablet Take 20 mg by mouth daily.    [provider]  torsemide (DEMADEX) 10 MG tablet Take 1 tablet (10 mg total) by mouth daily as needed (For weight gain 3 pounds or more in 1 day or 5 pounds or more in 3 days). 01/04/19   Hughie Closs, MD    Family History    Family History  Problem Relation Age of Onset  . Heart attack Father   . Heart attack Mother    He indicated that the status of his mother is unknown. He indicated that the status of his father is unknown.  Social History    Social History   Socioeconomic History  . Marital status: Divorced    Spouse name: Not on file  . Number of children: Not on file  . Years of education: Not on file  . Highest education level: Not on file  Occupational History  . Not on file  Tobacco Use  . Smoking status: Current Some Day Smoker    Packs/day: 0.25    Types: Cigarettes  . Smokeless tobacco: Never Used  Substance and Sexual Activity  . Alcohol use: Never  . Drug use: Never  . Sexual activity: Not Currently  Other Topics Concern  . Not on file  Social History Narrative  . Not on file   Social Determinants of Health   Financial Resource Strain: Low Risk   . Difficulty of Paying Living Expenses: Not hard at all  Food Insecurity: No Food Insecurity  . Worried About Programme researcher, broadcasting/film/video in the Last Year: Never true  . Ran Out of Food in the Last Year: Never true  Transportation Needs: No Transportation Needs  . Lack of Transportation (Medical): No  . Lack of Transportation (Non-Medical): No  Physical Activity: Sufficiently Active  . Days of Exercise per Week: 3 days  . Minutes of Exercise per Session: 60 min  Stress: No Stress Concern Present  . Feeling of Stress : Not at all  Social Connections: Somewhat Isolated  . Frequency of Communication with Friends and Family: More than three times a week  . Frequency of Social Gatherings with Friends and Family: Never  .  Attends Religious Services: Never  . Active Member of Clubs or Organizations: Yes  . Attends Banker Meetings: Never  . Marital Status: Divorced  Catering manager Violence: Not At Risk  . Fear of Current or Ex-Partner: No  . Emotionally Abused: No  . Physically Abused: No  .  Sexually Abused: No     Review of Systems    General:  No chills, fever, night sweats or weight changes.  Cardiovascular:  No chest pain, dyspnea on exertion, edema, orthopnea, palpitations, paroxysmal nocturnal dyspnea. Dermatological: No rash, lesions/masses Respiratory: No cough, dyspnea Urologic: No hematuria, dysuria Abdominal:   No nausea, vomiting, diarrhea, bright red blood per rectum, melena, or hematemesis Neurologic:  No visual changes, wkns, changes in mental status. All other systems reviewed and are otherwise negative except as noted above.  Physical Exam    VS:  BP 122/71 (BP Location: Left Arm, Patient Position: Sitting, Cuff Size: Normal)   Pulse 88   Ht 5\' 9"  (1.753 m)   Wt 178 lb (80.7 kg)   BMI 26.29 kg/m  , BMI Body mass index is 26.29 kg/m. GEN: Well nourished, well developed, in no acute distress. HEENT: normal. Neck: Supple, no JVD, carotid bruits, or masses. Cardiac: RRR, no murmurs, rubs, or gallops. No clubbing, cyanosis, edema.  Radials/DP/PT 2+ and equal bilaterally.  Respiratory:  Respirations regular and unlabored, clear to auscultation bilaterally. GI: Soft, nontender, nondistended, BS + x 4. MS: no deformity or atrophy. Skin: warm and dry, no rash. Neuro:  Strength and sensation are intact. Psych: Normal affect.  Accessory Clinical Findings    ECG personally reviewed by me today-normal sinus rhythm rightward  axis 88 bpm- No acute changes  EKG 01/15/2019 Normal sinus rhythm left bundle branch block 99 bpm  Cath 11/24/2018  Left heart cath via right radial using real-time vascular ultrasound for access.  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  Totally occluded codominant right coronary supplied by right to right and left to right collaterals. PDA is diffusely diseased. Just after the origin from the right coronary the PDA may be graftable.  Tubular 30 to 40% left main.  50% proximal LAD followed by 60 to 70% proximal LAD.  First diagonal with severe proximal disease  Codominant circumflex with occlusion of the first marginal, small second and third marginals, 85% mid to distal circumflex before the large branching fourth obtuse marginal. Distal circumflex is then followed by 99% stenosis proximal to the circumflex PDA/left ventricular branch (likely too small to graft.  By echo LVEF is 25 to 30%. LVEDP is 61mmHg. Findings consistent with compensated acute onchronic systolic and diastolic heart failure.The patient has had a rapid turnaround from acute pulmonary edema.  Complex anatomy. Refer for surgical consultation to determine if he is a candidate for multivessel bypass.  Echocardiogram 12/23/2018 1. Left ventricular ejection fraction, by visual estimation, is 20 to 25%. The left ventricle has severely decreased function. There is no left ventricular hypertrophy. 2. Abnormal septal motion consistent with left bundle branch block. 3. Global right ventricle has normal systolic function.The right ventricular size is normal. No increase in right ventricular wall thickness. 4. Left atrial size was normal. 5. Right atrial size was normal. 6. 27 mm bioprosthetic vale is present in the mitral position. 7. The mitral valve has been repaired/replaced. No evidence of mitral valve regurgitation. No evidence of mitral stenosis. 8. The tricuspid valve is normal in structure. Tricuspid valve regurgitation is not demonstrated. 9. The aortic valve is tricuspid. Aortic valve regurgitation is mild. No evidence of aortic valve sclerosis or stenosis. 10. The pulmonic valve was normal in structure.  Pulmonic valve regurgitation is not visualized. 11. Normal pulmonary artery systolic pressure. 12. The inferior vena cava is normal in size with <50% respiratory variability, suggesting right atrial  pressure of 8 mmHg. 13. MagnaEase 68mm bioprosthetic valve is well seated and functioning normally. 14. There is residual posterior papillary muscle from native valve removal that is freely mobile. This is not a vegetation. Personally reviewed TEE intraoperatively and this finding was seen on final image.  Assessment & Plan   1.  Ischemic cardiomyopathy-history of MI, echocardiogram 12/23/2018 showed an estimated EF of 20 to 25%.  Weight today 178 pounds with a suspected dry weight of around 175 pounds.  Much better blood pressure control at home 130s over 80s. Continue losartan 50 mg tablet daily Change torsemide to 10 mg tablet daily as needed for weight gain of 3 pounds in a day or 5 pounds or more in 3 days. Start spironolactone 12.5 daily Order BMP  Coronary artery disease-no chest pain today.  Cardiac catheterization 11/24/2018 showed multivessel disease.  CABG x 3 on 11/30/2018 Continue aspirin 81 mg tablet daily Heart healthy low-sodium diet-salty 6 Increase physical activity as tolerated  Postoperative A. fib-EKG today shows normal sinus rhythm 88 bpm Continue amiodarone 200 mg tablet daily  Hyperlipidemia-11/25/2018: VLDL 31 01/27/2019: Cholesterol, Total 117; HDL 44; LDL Chol Calc (NIH) 49; Triglycerides 135 Continue rosuvastatin 20 mg daily Increase physical activity as tolerated Heart healthy low-sodium high-fiber diet  Essential hypertension-BP today 122/71.  Much better controlled at home 130s over 80s Continue losartan 50 mg tablet daily Heart healthy low-sodium diet Crease physical activity as tolerated  Disposition: Follow-up  in 1 month.  Thomasene Ripple. Marney Treloar NP-C        [facility] [address] Office [phone] Fax [phone]

## 2019-02-19 ENCOUNTER — Other Ambulatory Visit: Payer: Self-pay

## 2019-02-19 ENCOUNTER — Encounter: Payer: Self-pay | Admitting: General Practice

## 2019-02-19 ENCOUNTER — Ambulatory Visit (INDEPENDENT_AMBULATORY_CARE_PROVIDER_SITE_OTHER): Payer: Medicare Other | Admitting: General Practice

## 2019-02-19 VITALS — BP 122/71 | HR 88 | Ht 69.0 in | Wt 178.0 lb

## 2019-02-19 DIAGNOSIS — Z79899 Other long term (current) drug therapy: Secondary | ICD-10-CM | POA: Diagnosis not present

## 2019-02-19 DIAGNOSIS — I25810 Atherosclerosis of coronary artery bypass graft(s) without angina pectoris: Secondary | ICD-10-CM

## 2019-02-19 DIAGNOSIS — Z951 Presence of aortocoronary bypass graft: Secondary | ICD-10-CM

## 2019-02-19 DIAGNOSIS — I10 Essential (primary) hypertension: Secondary | ICD-10-CM | POA: Diagnosis not present

## 2019-02-19 DIAGNOSIS — I255 Ischemic cardiomyopathy: Secondary | ICD-10-CM

## 2019-02-19 DIAGNOSIS — E785 Hyperlipidemia, unspecified: Secondary | ICD-10-CM

## 2019-02-19 DIAGNOSIS — I480 Paroxysmal atrial fibrillation: Secondary | ICD-10-CM

## 2019-02-19 LAB — BASIC METABOLIC PANEL
BUN/Creatinine Ratio: 11 (ref 10–24)
BUN: 16 mg/dL (ref 8–27)
CO2: 32 mmol/L — ABNORMAL HIGH (ref 20–29)
Calcium: 9.1 mg/dL (ref 8.6–10.2)
Chloride: 93 mmol/L — ABNORMAL LOW (ref 96–106)
Creatinine, Ser: 1.45 mg/dL — ABNORMAL HIGH (ref 0.76–1.27)
GFR calc Af Amer: 58 mL/min/{1.73_m2} — ABNORMAL LOW (ref >59)
GFR calc non Af Amer: 50 mL/min/{1.73_m2} — ABNORMAL LOW (ref >59)
Glucose: 128 mg/dL — ABNORMAL HIGH (ref 65–99)
Potassium: 3 mmol/L — ABNORMAL LOW (ref 3.5–5.2)
Sodium: 142 mmol/L (ref 134–144)

## 2019-02-19 MED ORDER — SPIRONOLACTONE 25 MG PO TABS: 12.5000 mg | ORAL_TABLET | Freq: Every day | ORAL | 3 refills | Status: DC

## 2019-02-19 NOTE — Patient Instructions (Addendum)
Medication Instructions:  TAKE TORSEMIDE 10MG  ONLY AS-NEEDED FOR SWELLING SEE INSTRUCTIONS ON BOTTLE  START SPIRONOLACTONE 12.5MG  (1/2 TAB)DAILY If you need a refill on your cardiac medications before your next appointment, please call your pharmacy.  Labwork: BMET TODAY HERE IN OUR OFFICE AT [facility]   If you have labs (blood work) drawn today and your tests are completely normal, you will receive your results only by: Marland Kitchen MyChart Message (if you have MyChart) OR . A paper copy in the mail If you have any lab test that is abnormal or we need to change your treatment, we will call you to review the results.  Special Instructions: PLEASE READ AND FOLLOW SALTY 6 ATTACHED  Reduce your risk of getting COVID-19 With your heart disease it is especially important for people at increased risk of severe illness from COVID-19, and those who live with them, to protect themselves from getting COVID-19. The best way to protect yourself and to help reduce the spread of the virus that causes COVID-19 is to: Marland Kitchen Limit your interactions with other people as much as possible. . Take precautions to prevent getting COVID-19 when you do interact with others. If you start feeling sick and think you may have COVID-19, get in touch with your healthcare provider within 24 hours.  Follow-Up: IN 1 MONTHVirtual Visit  You may see Pixie Casino, MD, Jory Sims, DNP, Almyra Deforest, PA-C, JESSE CLEAVER,FNP or one of the following Advanced Practice Providers on your designated Care Team:  Fabian Sharp, [place] or Roby Lofts, [place].    At [facility], you and your health needs are our priority.  As part of our continuing mission to provide you with exceptional heart care, we have created designated Provider Care Teams.  These Care Teams include your primary Cardiologist (physician) and Advanced Practice Providers (APPs -  Physician Assistants and Nurse Practitioners) who all work together to provide you with the care you  need, when you need it.  Thank you for choosing [facility] at [facility]!!     Happy Holidays!!

## 2019-03-04 ENCOUNTER — Other Ambulatory Visit: Payer: Self-pay

## 2019-03-04 ENCOUNTER — Encounter: Payer: Self-pay | Admitting: Physical Medicine & Rehabilitation

## 2019-03-04 ENCOUNTER — Encounter: Payer: Medicare Other | Attending: Physical Medicine & Rehabilitation | Admitting: Physical Medicine & Rehabilitation

## 2019-03-04 VITALS — BP 101/66 | HR 99 | Ht 69.0 in | Wt 186.6 lb

## 2019-03-04 DIAGNOSIS — M19072 Primary osteoarthritis, left ankle and foot: Secondary | ICD-10-CM

## 2019-03-04 DIAGNOSIS — I5042 Chronic combined systolic (congestive) and diastolic (congestive) heart failure: Secondary | ICD-10-CM | POA: Insufficient documentation

## 2019-03-04 DIAGNOSIS — R5381 Other malaise: Secondary | ICD-10-CM | POA: Insufficient documentation

## 2019-03-04 DIAGNOSIS — I2510 Atherosclerotic heart disease of native coronary artery without angina pectoris: Secondary | ICD-10-CM | POA: Diagnosis not present

## 2019-03-04 DIAGNOSIS — R269 Unspecified abnormalities of gait and mobility: Secondary | ICD-10-CM

## 2019-03-04 DIAGNOSIS — Z720 Tobacco use: Secondary | ICD-10-CM

## 2019-03-04 DIAGNOSIS — I252 Old myocardial infarction: Secondary | ICD-10-CM | POA: Diagnosis not present

## 2019-03-04 DIAGNOSIS — Z951 Presence of aortocoronary bypass graft: Secondary | ICD-10-CM | POA: Insufficient documentation

## 2019-03-04 DIAGNOSIS — I5040 Unspecified combined systolic (congestive) and diastolic (congestive) heart failure: Secondary | ICD-10-CM

## 2019-03-04 DIAGNOSIS — Z952 Presence of prosthetic heart valve: Secondary | ICD-10-CM | POA: Insufficient documentation

## 2019-03-04 NOTE — Progress Notes (Signed)
Subjective:    Patient ID: Curtis [name], male    DOB: [birth year], 68 y.o.   MRN: [id number]  TELEHEALTH NOTE  Due to national recommendations of social distancing due to COVID 19, an audio/video telehealth visit is felt to be most appropriate for this patient at this time.  See Chart message from today for the patient's consent to telehealth from [facility].     I verified that I am speaking with the correct person using two identifiers.  Location of patient: Home Location of provider: Office Method of communication: WebEx transition to telephone Names of participants : Wadie Lessen scheduling, Angela Nevin obtaining consent and vitals if available Established patient Time spent on call: 13 minutes  HPI Right-handed male with history of tobacco abuse presents for follow-up for cardiac debility.  Last seen in clinic on 01/06/2019 by NP.  Notes reviewed.  Nursing/caregiver supplements history. Since that time, he followed up with CTS. He is following up with Cards and HF. He completed therapies and is doing HEP. Left ankle pain is controlled. His weights are trending up and diuretics being adjusted.  Denies falls.   Pain Inventory Average Pain 0 Pain Right Now 0 My pain is no pain  In the last 24 hours, has pain interfered with the following? General activity 0 Relation with others 0 Enjoyment of life 0 What TIME of day is your pain at its worst? no pain Sleep (in general) Good  Pain is worse with: no pain Pain improves with: no pain Relief from Meds: no pain  Mobility walk without assistance how many minutes can you walk? 20+ ability to climb steps?  yes do you drive?  no  Function retired  Neuro/Psych No problems in this area  Prior Studies Any changes since last visit?  no  Physicians involved in your care Any changes since last visit?  no   Family History  Problem Relation Age of Onset  . Heart attack Father   .  Heart attack Mother    Social History   Socioeconomic History  . Marital status: Divorced    Spouse name: Not on file  . Number of children: Not on file  . Years of education: Not on file  . Highest education level: Not on file  Occupational History  . Not on file  Tobacco Use  . Smoking status: Current Some Day Smoker    Packs/day: 0.25    Types: Cigarettes  . Smokeless tobacco: Never Used  Substance and Sexual Activity  . Alcohol use: Never  . Drug use: Never  . Sexual activity: Not Currently  Other Topics Concern  . Not on file  Social History Narrative  . Not on file   Social Determinants of Health   Financial Resource Strain: Low Risk   . Difficulty of Paying Living Expenses: Not hard at all  Food Insecurity: No Food Insecurity  . Worried About Programme researcher, broadcasting/film/video in the Last Year: Never true  . Ran Out of Food in the Last Year: Never true  Transportation Needs: No Transportation Needs  . Lack of Transportation (Medical): No  . Lack of Transportation (Non-Medical): No  Physical Activity: Sufficiently Active  . Days of Exercise per Week: 3 days  . Minutes of Exercise per Session: 60 min  Stress: No Stress Concern Present  . Feeling of Stress : Not at all  Social Connections: Somewhat Isolated  . Frequency of Communication with Friends and Family: More than three  times a week  . Frequency of Social Gatherings with Friends and Family: Never  . Attends Religious Services: Never  . Active Member of Clubs or Organizations: Yes  . Attends BankerClub or Organization Meetings: Never  . Marital Status: Divorced   Past Surgical History:  Procedure Laterality Date  . CORONARY ARTERY BYPASS GRAFT N/A 11/30/2018   Procedure: MEDIASTINAL POST-OP BLEED;  Surgeon: Linden DolinAtkins, Broadus Z, MD;  Location: [facility] OR;  Service: Open Heart Surgery;  Laterality: N/A;  . CORONARY ARTERY BYPASS GRAFT N/A 11/30/2018   Procedure: CORONARY ARTERY BYPASS GRAFTING (CABG) x Three , using left internal  mammary artery and left leg greater saphenous vein harvested endoscopically;  Surgeon: Linden DolinAtkins, Broadus Z, MD;  Location: [facility] OR;  Service: Open Heart Surgery;  Laterality: N/A;  . LEFT HEART CATH AND CORONARY ANGIOGRAPHY N/A 11/24/2018   Procedure: LEFT HEART CATH AND CORONARY ANGIOGRAPHY;  Surgeon: Lyn RecordsSmith, Henry W, MD;  Location: [facility] INVASIVE CV LAB;  Service: Cardiovascular;  Laterality: N/A;  . MITRAL VALVE REPAIR N/A 11/30/2018   Procedure: MITRAL VALVE REPLACEMENT (MVR) USING MAGNA EASE SIZE 27mm;  Surgeon: Linden DolinAtkins, Broadus Z, MD;  [facility];  Service: Open Heart Surgery;  Laterality: N/A;  . PLACEMENT OF IMPELLA LEFT VENTRICULAR ASSIST DEVICE N/A 11/30/2018   Procedure: PLACEMENT OF IMPELLA LEFT VENTRICULAR ASSIST DEVICE;  Surgeon: Linden DolinAtkins, Broadus Z, MD;  Location: [facility] OR;  Service: Open Heart Surgery;  Laterality: N/A;  . REMOVAL OF IMPELLA LEFT VENTRICULAR ASSIST DEVICE N/A 12/04/2018   Procedure: REMOVAL OF IMPELLA LEFT VENTRICULAR ASSIST DEVICE.  EVACUATION OF LEFT HEMOTHORAX;  Surgeon: Linden DolinAtkins, Broadus Z, MD;  Location: [facility] OR;  Service: Open Heart Surgery;  Laterality: N/A;  . STERNAL INCISION RECLOSURE  12/04/2018   Procedure: Sternal Plating with Sternal Rewiring;  Surgeon: Linden DolinAtkins, Broadus Z, MD;  Location: [facility] OR;  Service: Open Heart Surgery;;  . TEE WITHOUT CARDIOVERSION N/A 11/26/2018   Procedure: TRANSESOPHAGEAL ECHOCARDIOGRAM (TEE);  Surgeon: Jake BatheSkains, Mark C, MD;  [facility];  Service: Cardiovascular;  Laterality: N/A;  . TEE WITHOUT CARDIOVERSION N/A 11/30/2018   Procedure: TRANSESOPHAGEAL ECHOCARDIOGRAM (TEE);  Surgeon: Linden DolinAtkins, Broadus Z, MD;  [facility];  Service: Open Heart Surgery;  Laterality: N/A;  . TEE WITHOUT CARDIOVERSION N/A 12/04/2018   Procedure: TRANSESOPHAGEAL ECHOCARDIOGRAM (TEE);  Surgeon: Linden DolinAtkins, Broadus Z, MD;  [facility];  Service: Open Heart Surgery;  Laterality: N/A;   Past Medical History:  Diagnosis Date  . Coronary artery disease   .  Elevated troponin 11/25/2018  . Tobacco use    BP 101/66   Pulse 99   Ht 5\' 9"  (1.753 m)   Wt 186 lb 9.6 oz (84.6 kg)   BMI 27.56 kg/m   Opioid Risk Score:   Fall Risk Score:  `1  Depression screen PHQ 2/9  Depression screen PHQ 2/9 01/06/2019  Decreased Interest 0  Down, Depressed, Hopeless 0  PHQ - 2 Score 0  Altered sleeping 0  Tired, decreased energy 0  Change in appetite 0  Feeling bad or failure about yourself  0  Trouble concentrating 0  Moving slowly or fidgety/restless 0  Suicidal thoughts 0  PHQ-9 Score 0  Difficult doing work/chores Not difficult at all    Review of Systems  Constitutional: Negative.   HENT: Negative.   Eyes: Negative.   Respiratory: Negative.   Cardiovascular: Negative.   Gastrointestinal: Negative.   Endocrine: Negative.   Genitourinary: Negative.   Musculoskeletal: Negative.   Skin: Negative.  Allergic/Immunologic: Negative.   Neurological: Negative.   Hematological: Negative.   Psychiatric/Behavioral: Negative.   All other systems reviewed and are negative.      Objective:   Physical Exam  Constitutional: No distress .  Respiratory: Normal effort.  No stridor. Psych: Normal mood.  Normal behavior. Neuro: Alert and oriented    Assessment & Plan:  Right-handed male with history of tobacco abuse presents for follow-up for cardiac debility.  1. Debility secondary to CAD with non-STEMI status post CABG with MVR 11/30/2018 with postoperative mediastinal bleeding/cardiogenic shock/acute respiratory failure with removal of Impella left ventricular assistive device evacuation of left hemothorax 12/04/2018. Sternal precautions   Cont HEP, completed therapies  2. Left ankle OA  Controlled with OTC meds  3. Combined CHF   Weight trending up  Close follow up with HF for diuretic adjustments  4. Gait abnormality  Predominantly limited by endurance  Cont progressive increase in activity toleratnce

## 2019-03-08 ENCOUNTER — Telehealth: Payer: Self-pay | Admitting: Internal Medicine

## 2019-03-08 MED ORDER — SPIRONOLACTONE 25 MG PO TABS: 25.0000 mg | ORAL_TABLET | Freq: Every day | ORAL | 3 refills | Status: DC

## 2019-03-08 MED ORDER — POTASSIUM CHLORIDE CRYS ER 20 MEQ PO TBCR: EXTENDED_RELEASE_TABLET | ORAL | 0 refills | Status: DC

## 2019-03-08 NOTE — Telephone Encounter (Signed)
Pt c/o swelling: STAT is pt has developed SOB within 24 hours  1) How much weight have you gained and in what time span?  7lbs in a week  2) If swelling, where is the swelling located? feet  3) Are you currently taking a fluid pill? yes  4) Are you currently SOB? no  5) Do you have a log of your daily weights (if so, list)? 187, 187, 188, 187  6) Have you gained 3 pounds in a day or 5 pounds in a week? Yes 3 in a day from 12/27-12/28, and 7lbs in a week  7) Have you traveled recently? no  Ester Skiff calling on behalf of the patient whose feet have been swelling over the last few days. She states that he was SOB today putting his shoes on this morning and has become non compliant with his diet. She states since last Wednesday 12/30 his weight has not decreased. She can be reached at: [phone]

## 2019-03-08 NOTE — Telephone Encounter (Signed)
Returned call to patient advised to increase Aldactone to 25 mg 1 tablet daily.Take KDur 20 meq daily for 3 days only.Appointment scheduled with Edd Fabian NP 03/11/19 at 10:15 am.Bmet to be done at visit.

## 2019-03-08 NOTE — Addendum Note (Signed)
Addended by: Neoma Laming on: 03/08/2019 05:38 PM   Modules accepted: Orders

## 2019-03-08 NOTE — Telephone Encounter (Signed)
Follow Up  Patient is calling back in about swelling in feet. Patient is now having SOB with exertion (went to the bathroom and is now SOB). Patient has fluid pills that are PRN, last time he has taken the fluid pills were on Wednesday. Please give patient a call back to advise at [phone].

## 2019-03-08 NOTE — Telephone Encounter (Signed)
Returned call to patient's Writer.She works for [employer] and helps patient with his medications.Stated patient has bilateral cataracts and cannot see well.She stated patient has gained 7 lbs in 7 days.He is not following a low salt diet.He has increased swelling in both lower legs and feet.Stated he never took KDur as prescribed.Spoke to DOD Dr.Harding he advised to increase Aldactone to 25 mg 1 tablet every day.Take Kdur 20 meq daily for 3 days.Appointment scheduled with Edd Fabian NP Thursday 03/11/19 at 10:15 am.Bmet to be done on 03/11/19.Darral Dash stated she will come with patient to appointment.

## 2019-03-10 NOTE — Progress Notes (Signed)
Cardiology Clinic Note   Patient Name: Curtis [name] Date of Encounter: 03/11/2019  Primary Care Provider:  Patient, [name]:  Chrystie Nose, MD  Patient Profile    Curtis [name] 69 year old male presents today for follow-up of his hypertension, coronary artery disease, PAF, and chronic combined systolic and diastolic CHF.  Past Medical History    Past Medical History:  Diagnosis Date  . Coronary artery disease   . Elevated troponin 11/25/2018  . Tobacco use    Past Surgical History:  Procedure Laterality Date  . CORONARY ARTERY BYPASS GRAFT N/A 11/30/2018   Procedure: MEDIASTINAL POST-OP BLEED;  Surgeon: Linden Dolin, MD;  Location: [facility] OR;  Service: Open Heart Surgery;  Laterality: N/A;  . CORONARY ARTERY BYPASS GRAFT N/A 11/30/2018   Procedure: CORONARY ARTERY BYPASS GRAFTING (CABG) x Three , using left internal mammary artery and left leg greater saphenous vein harvested endoscopically;  Surgeon: Linden Dolin, MD;  Location: [facility] OR;  Service: Open Heart Surgery;  Laterality: N/A;  . LEFT HEART CATH AND CORONARY ANGIOGRAPHY N/A 11/24/2018   Procedure: LEFT HEART CATH AND CORONARY ANGIOGRAPHY;  Surgeon: Lyn Records, MD;  Location: [facility] INVASIVE CV LAB;  Service: Cardiovascular;  Laterality: N/A;  . MITRAL VALVE REPAIR N/A 11/30/2018   Procedure: MITRAL VALVE REPLACEMENT (MVR) USING MAGNA EASE SIZE 39mm;  Surgeon: Linden Dolin, MD;  Location: [facility] OR;  Service: Open Heart Surgery;  Laterality: N/A;  . PLACEMENT OF IMPELLA LEFT VENTRICULAR ASSIST DEVICE N/A 11/30/2018   Procedure: PLACEMENT OF IMPELLA LEFT VENTRICULAR ASSIST DEVICE;  Surgeon: Linden Dolin, MD;  Location: [facility] OR;  Service: Open Heart Surgery;  Laterality: N/A;  . REMOVAL OF IMPELLA LEFT VENTRICULAR ASSIST DEVICE N/A 12/04/2018   Procedure: REMOVAL OF IMPELLA LEFT VENTRICULAR ASSIST DEVICE.  EVACUATION OF LEFT HEMOTHORAX;  Surgeon: Linden Dolin, MD;  Location: [facility] OR;  Service: Open  Heart Surgery;  Laterality: N/A;  . STERNAL INCISION RECLOSURE  12/04/2018   Procedure: Sternal Plating with Sternal Rewiring;  Surgeon: Linden Dolin, MD;  Location: [facility] OR;  Service: Open Heart Surgery;;  . TEE WITHOUT CARDIOVERSION N/A 11/26/2018   Procedure: TRANSESOPHAGEAL ECHOCARDIOGRAM (TEE);  Surgeon: Jake Bathe, MD;  Location: [facility] ENDOSCOPY;  Service: Cardiovascular;  Laterality: N/A;  . TEE WITHOUT CARDIOVERSION N/A 11/30/2018   Procedure: TRANSESOPHAGEAL ECHOCARDIOGRAM (TEE);  Surgeon: Linden Dolin, MD;  Location: [facility] OR;  Service: Open Heart Surgery;  Laterality: N/A;  . TEE WITHOUT CARDIOVERSION N/A 12/04/2018   Procedure: TRANSESOPHAGEAL ECHOCARDIOGRAM (TEE);  Surgeon: Linden Dolin, MD;  Location: [facility] OR;  Service: Open Heart Surgery;  Laterality: N/A;    Allergies  Allergies  Allergen Reactions  . Penicillins Rash and Other (See Comments)    Tolerated cefepime 12/2018  Did it involve swelling of the face/tongue/throat, SOB, or low BP? Unknown Did it involve sudden or severe rash/hives, skin peeling, or any reaction on the inside of your mouth or nose? Yes Did you need to seek medical attention at a hospital or doctor's office? Unknown When did it last happen? childhood If all above answers are "NO", may proceed with cephalosporin use.     History of Present Illness    Curtis [name] has a past medical history of coronary artery disease status post CABG and mitral valve repair, history of narrow complex tachycardia, and left bundle branch block.  Cardiac catheterization on 11/24/2018 which revealed 30% to 40% left main disease, 50% proximal LAD,  also further distal 60 to 70% proximal LAD lesion, severe disease in proximal diagonal 1, 85% mid to distal left circumflex lesion, 99%  left circumflex, totally occluded codominant RCA with right to right and left to right collaterals.  EF 25 to 30%.  His CABG including LIMA to LAD, SVG to OM, SVG to diagonal and last  prosthetic MVR on 11/30/2018.  His postoperative course was complicated with cardiogenic shock in the setting of severe ischemic cardiomyopathy and postop bleeding along with hemorrhagic shock.  He was placed on Impella for several days along with milrinone.  He also had a left hemothorax evacuation.  He was placed on Entresto and spironolactone for afterload reduction and amiodarone for transient postoperative atrial fibrillation.  He was admitted to the hospital again on 01/01/2019 with near syncope and was found to be hypotensive with a blood pressure of 84/62.  His torsemide was discontinued along with his Entresto and spironolactone.  He was to use torsemide as needed for volume overload.  Repeat echocardiogram showed an LVEF of 20-25% with a stable bioprosthetic valve.  He was seen in the office on 01/14/2019 by Azalee CourseHao Meng, PA-C.  He was reluctant to restart Entresto but did however allow the start of losartan 25 mg daily.  Additional spironolactone was added to his regimen for optimization.  During that time he was instructed to finish his current dose of amiodarone then discontinue.  He was seen by Joni ReiningKathryn Lawrence, DNP on 01/27/2019.  During that time he indicated that he had fallen to his knees while going upstairs and had not disclosed the incident until that morning.  He indicated that he did not pass out and his legs became weak as he was climbing the stairs.  He was able to crawl the rest of the way to the top of the staircase.  Orthostatic blood pressures were checked at that time which showed mild orthostasis.  During his last office visit he was unaware of the medications he was taking.  He had a nurse aide with him at the time he was unaware of medicines he was taking and does not have a medication list.  On presentation to the clinic he had not taken his medicines yet that day.  He had gained approximately 10 pounds and his dry weight was approximately 175 pounds.  He did not adhere to a  low-sodium diet, and was eating pizza as well as deli meats.  He stated he did not like taking diuretics.  His torsemide was restarted at 10 mg daily.  He presented to the clinic 02/19/19 with his home health nurse and stated he felt well.  His breathing was much better.  did have some fatigued.  He continued to be active with physical therapy every other day for 1 hour and also does his physical therapy activities on off days for 1 hour as well.  He stated he was active in his house as well.  He had been weighing daily and monitoring his diet.  His home health nurse filled his medication and was well informed about what he was taking.  I  ordered a BMP, added spironolactone 12.5 mg, and scheduled him for follow-up in 1 month.  He contacted the office on 03/08/2019 and indicated that he had increased lower extremity swelling.  At that time his spironolactone was increased to 25 mg daily and he was instructed to take 20 mEq of potassium daily for 3 days.  He presents to the clinic today and states  that he has been weighing daily.  However, he does not administer his own medications.  I reiterated that with a weight gain of 3 pounds overnight or 5 pounds in 1 day he needs to take 10 mEq of torsemide.  He continues to be physically active doing his physical therapy exercises about every third day.  He remains compliant with his low-sodium diet.  Both him and his caregiver expressed understanding.  I will give them the salty 6 sheet, and have him wear lower extremity support stockings. I will check a BMP today and have him follow-up in 1 week for reevaluation repeat blood work.  He denies chest pain, shortness of breath, lower extremity edema,palpitations, melena, hematuria, hemoptysis, diaphoresis, weakness, presyncope, syncope, orthopnea, and PND.   Home Medications    Prior to Admission medications   Medication Sig Start Date End Date Taking? Authorizing Provider  amiodarone (PACERONE) 200 MG tablet  Take 1 tablet (200 mg total) by mouth 2 (two) times daily. For 5 days then take Amiodarone 200 mg daily thereafter Patient taking differently: Take 200 mg by mouth daily.  12/23/18   Angiulli, Mcarthur Rossetti, PA-C  aspirin EC 81 MG tablet Take 81 mg by mouth daily.    [provider]  guaiFENesin (MUCINEX) 600 MG 12 hr tablet Take 1 tablet (600 mg total) by mouth 2 (two) times daily. Patient taking differently: Take 600 mg by mouth daily.  12/23/18   Angiulli, Mcarthur Rossetti, PA-C  losartan (COZAAR) 50 MG tablet Take 50 mg by mouth daily.    [provider]  Multiple Vitamin (MULTIVITAMIN WITH MINERALS) TABS tablet Take 1 tablet by mouth daily. 12/12/18   Ardelle Balls, PA-C  pantoprazole (PROTONIX) 40 MG tablet Take 1 tablet (40 mg total) by mouth daily. 12/24/18   Angiulli, Mcarthur Rossetti, PA-C  potassium chloride SA (KLOR-CON) 20 MEQ tablet Take 20 meq 1 tablet daily for 3 days only 03/08/19   Marykay Lex, MD  rosuvastatin (CRESTOR) 20 MG tablet Take 20 mg by mouth daily.    [provider]  spironolactone (ALDACTONE) 25 MG tablet Take 1 tablet (25 mg total) by mouth daily. 03/08/19   Marykay Lex, MD  torsemide (DEMADEX) 10 MG tablet Take 1 tablet (10 mg total) by mouth daily as needed (For weight gain 3 pounds or more in 1 day or 5 pounds or more in 3 days). Patient not taking: Reported on 03/04/2019 01/04/19   Hughie Closs, MD    Family History    Family History  Problem Relation Age of Onset  . Heart attack Father   . Heart attack Mother    He indicated that the status of his mother is unknown. He indicated that the status of his father is unknown.  Social History    Social History   Socioeconomic History  . Marital status: Divorced    Spouse name: Not on file  . Number of children: Not on file  . Years of education: Not on file  . Highest education level: Not on file  Occupational History  . Not on file  Tobacco Use  . Smoking status: Current Some  Day Smoker    Packs/day: 0.25    Types: Cigarettes  . Smokeless tobacco: Never Used  Substance and Sexual Activity  . Alcohol use: Never  . Drug use: Never  . Sexual activity: Not Currently  Other Topics Concern  . Not on file  Social History Narrative  . Not on file  Social Determinants of Health   Financial Resource Strain: Low Risk   . Difficulty of Paying Living Expenses: Not hard at all  Food Insecurity: No Food Insecurity  . Worried About Charity fundraiser in the Last Year: Never true  . Ran Out of Food in the Last Year: Never true  Transportation Needs: No Transportation Needs  . Lack of Transportation (Medical): No  . Lack of Transportation (Non-Medical): No  Physical Activity: Sufficiently Active  . Days of Exercise per Week: 3 days  . Minutes of Exercise per Session: 60 min  Stress: No Stress Concern Present  . Feeling of Stress : Not at all  Social Connections: Somewhat Isolated  . Frequency of Communication with Friends and Family: More than three times a week  . Frequency of Social Gatherings with Friends and Family: Never  . Attends Religious Services: Never  . Active Member of Clubs or Organizations: Yes  . Attends Archivist Meetings: Never  . Marital Status: Divorced  Human resources officer Violence: Not At Risk  . Fear of Current or Ex-Partner: No  . Emotionally Abused: No  . Physically Abused: No  . Sexually Abused: No     Review of Systems    General:  No chills, fever, night sweats or weight changes.  Cardiovascular:  No chest pain, dyspnea on exertion, edema, orthopnea, palpitations, paroxysmal nocturnal dyspnea. Dermatological: No rash, lesions/masses Respiratory: No cough, dyspnea Urologic: No hematuria, dysuria Abdominal:   No nausea, vomiting, diarrhea, bright red blood per rectum, melena, or hematemesis Neurologic:  No visual changes, wkns, changes in mental status. All other systems reviewed and are otherwise negative except as  noted above.  Physical Exam    VS:  BP 140/73 (BP Location: Left Arm, Patient Position: Sitting, Cuff Size: Normal)   Pulse 100   Ht 5\' 9"  (1.753 m)   Wt 191 lb 3.2 oz (86.7 kg)   BMI 28.24 kg/m  , BMI Body mass index is 28.24 kg/m. GEN: Well nourished, well developed, in no acute distress. HEENT: normal. Neck: Supple, no JVD, carotid bruits, or masses. Cardiac: RRR, no murmurs, rubs, or gallops. No clubbing, cyanosis, edema.  Radials/DP/PT 2+ and equal bilaterally.  Respiratory:  Respirations regular and unlabored, clear to auscultation bilaterally. GI: Soft, nontender, nondistended, BS + x 4. MS: no deformity or atrophy. Skin: warm and dry, no rash. Neuro:  Strength and sensation are intact. Psych: Normal affect.  Accessory Clinical Findings    ECG personally reviewed by me today-none today  EKG 02/19/2019 normal sinus rhythm rightward  axis 88 bpm- No acute changes  EKG 01/15/2019 Normal sinus rhythm left bundle branch block 99 bpm  Cath 11/24/2018  Left heart cath via right radial using real-time vascular ultrasound for access.  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  Totally occluded codominant right coronary supplied by right to right and left to right collaterals. PDA is diffusely diseased. Just after the origin from the right coronary the PDA may be graftable.  Tubular 30 to 40% left main.  50% proximal LAD followed by 60 to 70% proximal LAD.  First diagonal with severe proximal disease  Codominant circumflex with occlusion of the first marginal, small second and third marginals, 85% mid to distal circumflex before the large branching fourth obtuse marginal. Distal circumflex is then followed by 99% stenosis proximal to the circumflex PDA/left ventricular branch (likely too small to graft.  By echo LVEF is 25 to 30%. LVEDP is 54mmHg. Findings  consistent with compensated acute onchronic systolic and diastolic heart  failure.The patient has had a rapid turnaround from acute pulmonary edema.  Complex anatomy. Refer for surgical consultation to determine if he is a candidate for multivessel bypass.  Echocardiogram10/21/2020 1. Left ventricular ejection fraction, by visual estimation, is 20 to 25%. The left ventricle has severely decreased function. There is no left ventricular hypertrophy. 2. Abnormal septal motion consistent with left bundle branch block. 3. Global right ventricle has normal systolic function.The right ventricular size is normal. No increase in right ventricular wall thickness. 4. Left atrial size was normal. 5. Right atrial size was normal. 6. 27 mm bioprosthetic vale is present in the mitral position. 7. The mitral valve has been repaired/replaced. No evidence of mitral valve regurgitation. No evidence of mitral stenosis. 8. The tricuspid valve is normal in structure. Tricuspid valve regurgitation is not demonstrated. 9. The aortic valve is tricuspid. Aortic valve regurgitation is mild. No evidence of aortic valve sclerosis or stenosis. 10. The pulmonic valve was normal in structure. Pulmonic valve regurgitation is not visualized. 11. Normal pulmonary artery systolic pressure. 12. The inferior vena cava is normal in size with <50% respiratory variability, suggesting right atrial pressure of 8 mmHg. 13. MagnaEase 57mm bioprosthetic valve is well seated and functioning normally. 14. There is residual posterior papillary muscle from native valve removal that is freely mobile. This is not a vegetation. Personally reviewed TEE intraoperatively and this finding was seen on final image.  Assessment & Plan   1.  Ischemic cardiomyopathy/Lower extremity edema -history of MI, echocardiogram 12/23/2018 showed an estimated EF of 20 to 25%.  Weight today 191  pounds with a suspected dry weight of around 178 pounds. 178 on 02/19/19  Much better blood pressure control at home 130s over  80s. Continue losartan 50 mg tablet daily Take torsemide 10 mg tablet x3 days then Contine torsemide  10 mg tablet daily as needed for weight gain of 3 pounds in a day or 5 pounds or more in 3 days. Take potassium 10 mEq x 3 days then stop. Contine spironolactone 25 daily  Elevate lower extremities when not active Wearing lower extremity support stockings Order BMP today and in 1 week  Coronary artery disease-no chest pain today.  Cardiac catheterization 11/24/2018 showed multivessel disease.  CABG x 3 on 11/30/2018 Continue aspirin 81 mg tablet daily Heart healthy low-sodium diet-salty 6-reviewed Increase physical activity as tolerated  Postoperative A. fib-EKG today shows normal sinus rhythm 88 bpm Continue amiodarone 200 mg tablet daily  Hyperlipidemia-11/25/2018: VLDL 31 01/27/2019: Cholesterol, Total 117; HDL 44; LDL Chol Calc (NIH) 49; Triglycerides 135 Continue rosuvastatin 20 mg daily Increase physical activity as tolerated Heart healthy low-sodium high-fiber diet  Essential hypertension-BP today 140/73.     At home 140s-130s over 80s Continue losartan 50 mg tablet daily Heart healthy low-sodium diet Crease physical activity as tolerated  Disposition: Keep follow-up with me in 1 week.    Thomasene Ripple. Britta Louth NP-C        [facility] [address] Office [phone] Fax [phone]

## 2019-03-11 ENCOUNTER — Encounter: Payer: Self-pay | Admitting: General Practice

## 2019-03-11 ENCOUNTER — Other Ambulatory Visit: Payer: Self-pay

## 2019-03-11 ENCOUNTER — Ambulatory Visit (INDEPENDENT_AMBULATORY_CARE_PROVIDER_SITE_OTHER): Payer: Medicare Other | Admitting: General Practice

## 2019-03-11 VITALS — BP 140/73 | HR 100 | Ht 69.0 in | Wt 191.2 lb

## 2019-03-11 DIAGNOSIS — Z79899 Other long term (current) drug therapy: Secondary | ICD-10-CM

## 2019-03-11 DIAGNOSIS — I25810 Atherosclerosis of coronary artery bypass graft(s) without angina pectoris: Secondary | ICD-10-CM | POA: Diagnosis not present

## 2019-03-11 DIAGNOSIS — E785 Hyperlipidemia, unspecified: Secondary | ICD-10-CM

## 2019-03-11 DIAGNOSIS — R600 Localized edema: Secondary | ICD-10-CM | POA: Diagnosis not present

## 2019-03-11 DIAGNOSIS — I480 Paroxysmal atrial fibrillation: Secondary | ICD-10-CM | POA: Diagnosis not present

## 2019-03-11 DIAGNOSIS — I255 Ischemic cardiomyopathy: Secondary | ICD-10-CM | POA: Diagnosis not present

## 2019-03-11 DIAGNOSIS — I10 Essential (primary) hypertension: Secondary | ICD-10-CM

## 2019-03-11 LAB — BASIC METABOLIC PANEL
BUN/Creatinine Ratio: 14 (ref 10–24)
BUN: 15 mg/dL (ref 8–27)
CO2: 25 mmol/L (ref 20–29)
Calcium: 9.1 mg/dL (ref 8.6–10.2)
Chloride: 100 mmol/L (ref 96–106)
Creatinine, Ser: 1.11 mg/dL (ref 0.76–1.27)
GFR calc Af Amer: 80 mL/min/{1.73_m2} (ref >59)
GFR calc non Af Amer: 69 mL/min/{1.73_m2} (ref >59)
Glucose: 134 mg/dL — ABNORMAL HIGH (ref 65–99)
Potassium: 3.8 mmol/L (ref 3.5–5.2)
Sodium: 141 mmol/L (ref 134–144)

## 2019-03-11 MED ORDER — AMIODARONE HCL 200 MG PO TABS: 200.0000 mg | ORAL_TABLET | Freq: Every day | ORAL | 11 refills | Status: DC

## 2019-03-11 MED ORDER — POTASSIUM CHLORIDE ER 10 MEQ PO TBCR: 10.0000 meq | EXTENDED_RELEASE_TABLET | Freq: Every day | ORAL | 3 refills | Status: DC | PRN

## 2019-03-11 MED ORDER — SPIRONOLACTONE 25 MG PO TABS: 25.0000 mg | ORAL_TABLET | Freq: Every day | ORAL | 3 refills | Status: DC

## 2019-03-11 MED ORDER — TORSEMIDE 10 MG PO TABS: 10.0000 mg | ORAL_TABLET | Freq: Every day | ORAL | 6 refills | Status: DC | PRN

## 2019-03-11 NOTE — Patient Instructions (Signed)
Medication Instructions:  TATKE TORSEMIDE 10MG  FOR 3 DAYS THEN BACK TO AS NEEDED  TAKE POTASSIUM ONLY WHEN YOU TAKE TORSEMIDE If you need a refill on your cardiac medications before your next appointment, please call your pharmacy.  Labwork: BMET TODAY AND IN ONE WEEK 03-18-19 HERE IN OUR OFFICE AT [facility]    You will NOT need to fast  If you have labs (blood work) drawn today and your tests are completely normal, you will receive your results only by: 02-03-1969 MyChart Message (if you have MyChart) OR . A paper copy in the mail If you have any lab test that is abnormal or we need to change your treatment, we will call you to review the results.  Special Instructions: CONTINUE TO TAKE DAILY WEIGHT AND BP-BRING WITH YOU TO APPTS  PLEASE READ AND FOLLOW SALTY 6 ATTACHED  Reduce your risk of getting COVID-19 With your heart disease it is especially important for people at increased risk of severe illness from COVID-19, and those who live with them, to protect themselves from getting COVID-19. The best way to protect yourself and to help reduce the spread of the virus that causes COVID-19 is to: Marland Kitchen Limit your interactions with other people as much as possible. . Take precautions to prevent getting COVID-19 when you do interact with others. If you start feeling sick and think you may have COVID-19, get in touch with your healthcare provider within 24 hours.  Follow-Up: IN 1 WEEK WITH JESSE CLEAVER In Person You may see Marland Kitchen, MDor one of the following Advanced Practice Providers on your designated Care Team:  Chrystie Nose, PA-C Azalee Course, PA-C or [place], [place].    At [facility], you and your health needs are our priority.  As part of our continuing mission to provide you with exceptional heart care, we have created designated Provider Care Teams.  These Care Teams include your primary Cardiologist (physician) and Advanced Practice Providers (APPs -  Physician Assistants and  Nurse Practitioners) who all work together to provide you with the care you need, when you need it.  Thank you for choosing [facility] at [facility]!!

## 2019-03-16 LAB — BASIC METABOLIC PANEL
BUN/Creatinine Ratio: 14 (ref 10–24)
BUN: 16 mg/dL (ref 8–27)
CO2: 30 mmol/L — ABNORMAL HIGH (ref 20–29)
Calcium: 9.1 mg/dL (ref 8.6–10.2)
Chloride: 101 mmol/L (ref 96–106)
Creatinine, Ser: 1.11 mg/dL (ref 0.76–1.27)
GFR calc Af Amer: 80 mL/min/{1.73_m2} (ref >59)
GFR calc non Af Amer: 69 mL/min/{1.73_m2} (ref >59)
Glucose: 131 mg/dL — ABNORMAL HIGH (ref 65–99)
Potassium: 4.1 mmol/L (ref 3.5–5.2)
Sodium: 144 mmol/L (ref 134–144)

## 2019-03-17 NOTE — Progress Notes (Signed)
Cardiology Clinic Note   Patient Name: Curtis [name] Date of Encounter: 03/18/2019  Primary Care Provider:  Patient, [name]:  Curtis NoseKenneth C Hilty, MD  Patient Profile    Curtis [name] 69 year old male presents today for follow-up of his lower extremity edema, hypertension, and combined systolic and diastolic heart failure.  Past Medical History    Past Medical History:  Diagnosis Date  . Coronary artery disease   . Elevated troponin 11/25/2018  . Tobacco use    Past Surgical History:  Procedure Laterality Date  . CORONARY ARTERY BYPASS GRAFT N/A 11/30/2018   Procedure: MEDIASTINAL POST-OP BLEED;  Surgeon: Curtis [name], Curtis Z, MD;  Location: [facility] OR;  Service: Open Heart Surgery;  Laterality: N/A;  . CORONARY ARTERY BYPASS GRAFT N/A 11/30/2018   Procedure: CORONARY ARTERY BYPASS GRAFTING (CABG) x Three , using left internal mammary artery and left leg greater saphenous vein harvested endoscopically;  Surgeon: Curtis [name], Curtis Z, MD;  Location: [facility] OR;  Service: Open Heart Surgery;  Laterality: N/A;  . LEFT HEART CATH AND CORONARY ANGIOGRAPHY N/A 11/24/2018   Procedure: LEFT HEART CATH AND CORONARY ANGIOGRAPHY;  Surgeon: Curtis [name], Curtis W, MD;  Location: [facility] INVASIVE CV [facility];  Service: Cardiovascular;  Laterality: N/A;  . MITRAL VALVE REPAIR N/A 11/30/2018   Procedure: MITRAL VALVE REPLACEMENT (MVR) USING MAGNA EASE SIZE 27mm;  Surgeon: Curtis [name], Curtis Z, MD;  [facility];  Service: Open Heart Surgery;  Laterality: N/A;  . PLACEMENT OF IMPELLA LEFT VENTRICULAR ASSIST DEVICE N/A 11/30/2018   Procedure: PLACEMENT OF IMPELLA LEFT VENTRICULAR ASSIST DEVICE;  Surgeon: Curtis [name], Curtis Z, MD;  Location: [facility] OR;  Service: Open Heart Surgery;  Laterality: N/A;  . REMOVAL OF IMPELLA LEFT VENTRICULAR ASSIST DEVICE N/A 12/04/2018   Procedure: REMOVAL OF IMPELLA LEFT VENTRICULAR ASSIST DEVICE.  EVACUATION OF LEFT HEMOTHORAX;  Surgeon: Curtis [name], Curtis Z, MD;  Location: [facility] OR;  Service: Open Heart  Surgery;  Laterality: N/A;  . STERNAL INCISION RECLOSURE  12/04/2018   Procedure: Sternal Plating with Sternal Rewiring;  Surgeon: Curtis [name], Curtis Z, MD;  Location: [facility] OR;  Service: Open Heart Surgery;;  . TEE WITHOUT CARDIOVERSION N/A 11/26/2018   Procedure: TRANSESOPHAGEAL ECHOCARDIOGRAM (TEE);  Surgeon: Curtis [name], Curtis C, MD;  [facility];  Service: Cardiovascular;  Laterality: N/A;  . TEE WITHOUT CARDIOVERSION N/A 11/30/2018   Procedure: TRANSESOPHAGEAL ECHOCARDIOGRAM (TEE);  Surgeon: Curtis [name], Curtis Z, MD;  [facility];  Service: Open Heart Surgery;  Laterality: N/A;  . TEE WITHOUT CARDIOVERSION N/A 12/04/2018   Procedure: TRANSESOPHAGEAL ECHOCARDIOGRAM (TEE);  Surgeon: Curtis [name], Curtis Z, MD;  [facility];  Service: Open Heart Surgery;  Laterality: N/A;    Allergies  Allergies  Allergen Reactions  . Penicillins Rash and Other (See Comments)    Tolerated cefepime 12/2018  Did it involve swelling of the face/tongue/throat, SOB, or low BP? Unknown Did it involve sudden or severe rash/hives, skin peeling, or any reaction on the inside of your mouth or [name]? Yes Did you need to seek medical attention at a hospital or doctor's office? Unknown When did it last happen? childhood If all above answers are "NO", may proceed with cephalosporin use.     History of Present Illness    Curtis [name] has a past medical history of coronary artery disease status post CABG and mitral valve repair, history of narrow complex tachycardia, and left bundle branch block. Cardiac catheterization on 11/24/2018 which revealed 30% to 40% left main disease, 50% proximal LAD, also further  distal 60 to 70% proximal LAD lesion, severe disease in proximal diagonal 1, 85% mid to distal left circumflex lesion, 99% left circumflex, totally occluded codominant RCA with right to right and left to right collaterals. EF 25 to 30%.  His CABG including LIMA to LAD, SVG to OM, SVG to diagonal and last  prosthetic MVR on 11/30/2018. His postoperative course was complicated with cardiogenic shock in the setting of severe ischemic cardiomyopathy and postop bleeding along with hemorrhagic shock. He was placed on Impella for several days along with milrinone. He also had a left hemothorax evacuation. He was placed on Entresto and spironolactone for afterload reduction and amiodarone for transient postoperative atrial fibrillation.  He was admitted to the hospital again on 01/01/2019 with near syncope and was found to be hypotensive with a blood pressure of 84/62. His torsemide was discontinued along with his Entresto and spironolactone. He was to use torsemide as needed for volume overload. Repeat echocardiogram showed an LVEF of 20-25% with a stable bioprosthetic valve.  He presented to the clinic 12/18/20with his home health nurseand statedhe felt well.  His breathing was much better. Did have some fatigued. He continued to be active with physical therapy every other day for 1 hour and also does his physical therapy activities on off days for 1 hour as well. He stated he was active in his house as well. He had been weighing daily and monitoring his diet. His home health nurse filled his medication and was well informed about what he was taking. I  ordered a BMP, added spironolactone 12.5 mg.  He contacted the office on 03/08/2019 and indicated that he had increased lower extremity swelling.  At that time his spironolactone was increased to 25 mg daily and he was instructed to take 20 mEq of potassium daily for 3 days.  He presents to the clinic today and states that he has been weighing daily.  However, he does not administer his own medications.  I reiterated that with a weight gain of 3 pounds overnight or 5 pounds in 1 day he needs to take 10 mEq of torsemide.  He continues to be physically active doing his physical therapy exercises about every third day.  He remains compliant with his  low-sodium diet.  Both him and his caregiver expressed understanding.  I gave them the salty 6 sheet, and had him wear lower extremity support stockings. I checked a BMP, and had him take torsemide 10 mg x 3 days and potassium 10 mEq x 3 days.  Instructed for repeat BMP prior to 03/18/2019 visit.  He presents today for follow-up and states he feels well.  He has continued to watch his weight and try to eat a low-sodium diet.  He denies any difficulty with his breathing or dyspnea on exertion.  He was unable to acquire lower extremity support stockings but will work on this.  I reviewed weighing the same time daily, preferably in the morning after voiding.  An established goal weight of 184.  Both Eddie Dibbles and his care Freight forwarder expressed understanding.  Hedenies chest pain, shortness of breath, increased lower extremity edema,palpitations, melena, hematuria, hemoptysis, diaphoresis, weakness, presyncope, syncope, orthopnea, and PND.   Home Medications    Prior to Admission medications   Medication Sig Start Date End Date Taking? Authorizing Provider  amiodarone (PACERONE) 200 MG tablet Take 1 tablet (200 mg total) by mouth daily. 03/11/19   Deberah Pelton, NP  aspirin EC 81 MG tablet Take 81 mg by  mouth daily.    [provider]  losartan (COZAAR) 50 MG tablet Take 50 mg by mouth daily.    [provider]  Multiple Vitamin (MULTIVITAMIN WITH MINERALS) TABS tablet Take 1 tablet by mouth daily. 12/12/18   Ardelle Balls, PA-C  potassium chloride (KLOR-CON) 10 MEQ tablet Take 1 tablet (10 mEq total) by mouth daily as needed (with torsemide). 03/11/19 06/09/19  Ronney Asters, NP  potassium chloride SA (KLOR-CON) 20 MEQ tablet Take 20 meq 1 tablet daily for 3 days only 03/08/19   Marykay Lex, MD  rosuvastatin (CRESTOR) 20 MG tablet Take 20 mg by mouth daily.    [provider]  spironolactone (ALDACTONE) 25 MG tablet Take 1 tablet (25 mg total) by mouth daily. 03/11/19    Ronney Asters, NP  torsemide (DEMADEX) 10 MG tablet Take 1 tablet (10 mg total) by mouth daily as needed (take 10mg  x3d then For weight gain 3 pounds or more in 1 day or 5 pounds or more in 3 days). 03/11/19   05/09/19, NP    Family History    Family History  Problem Relation Age of Onset  . Heart attack Father   . Heart attack Mother    He indicated that the status of his mother is unknown. He indicated that the status of his father is unknown.  Social History    Social History   Socioeconomic History  . Marital status: Divorced    Spouse name: Not on file  . Number of children: Not on file  . Years of education: Not on file  . Highest education level: Not on file  Occupational History  . Not on file  Tobacco Use  . Smoking status: Current Some Day Smoker    Packs/day: 0.25    Types: Cigarettes  . Smokeless tobacco: Never Used  Substance and Sexual Activity  . Alcohol use: Never  . Drug use: Never  . Sexual activity: Not Currently  Other Topics Concern  . Not on file  Social History Narrative  . Not on file   Social Determinants of Health   Financial Resource Strain: Low Risk   . Difficulty of Paying Living Expenses: Not hard at all  Food Insecurity: No Food Insecurity  . Worried About Ronney Asters in the Last Year: Never true  . Ran Out of Food in the Last Year: Never true  Transportation Needs: No Transportation Needs  . Lack of Transportation (Medical): No  . Lack of Transportation (Non-Medical): No  Physical Activity: Sufficiently Active  . Days of Exercise per Week: 3 days  . Minutes of Exercise per Session: 60 min  Stress: No Stress Concern Present  . Feeling of Stress : Not at all  Social Connections: Somewhat Isolated  . Frequency of Communication with Friends and Family: More than three times a week  . Frequency of Social Gatherings with Friends and Family: Never  . Attends Religious Services: Never  . Active Member of Clubs or  Organizations: Yes  . Attends Programme researcher, broadcasting/film/video Meetings: Never  . Marital Status: Divorced  Banker Violence: Not At Risk  . Fear of Current or Ex-Partner: No  . Emotionally Abused: No  . Physically Abused: No  . Sexually Abused: No     Review of Systems    General:  No chills, fever, night sweats or weight changes.  Cardiovascular:  No chest pain, dyspnea on exertion, edema, orthopnea, palpitations, paroxysmal nocturnal dyspnea. Dermatological:  No rash, lesions/masses Respiratory: No cough, dyspnea Urologic: No hematuria, dysuria Abdominal:   No nausea, vomiting, diarrhea, bright red blood per rectum, melena, or hematemesis Neurologic:  No visual changes, wkns, changes in mental status. All other systems reviewed and are otherwise negative except as noted above.  Physical Exam    VS:  BP 127/69 (BP Location: Left Arm, Patient Position: Sitting, Cuff Size: Normal)   Pulse 92   Temp 98.1 F (36.7 C)   Wt 190 lb 9.6 oz (86.5 kg)   BMI 28.15 kg/m  , BMI Body mass index is 28.15 kg/m. GEN: Well nourished, well developed, in no acute distress. HEENT: normal. Neck: Supple, no JVD, carotid bruits, or masses. Cardiac: RRR, no murmurs, rubs, or gallops. No clubbing, cyanosis, +1-2 lower extremity edema to mid shin.  Radials/DP/PT 2+ and equal bilaterally.  Respiratory:  Respirations regular and unlabored, clear to auscultation bilaterally. GI: Soft, nontender, nondistended, BS + x 4. MS: no deformity or atrophy. Skin: warm and dry, no rash. Neuro:  Strength and sensation are intact. Psych: Normal affect.  Accessory Clinical Findings    ECG personally reviewed by me today-none today  EKG 02/19/2019 normal sinus rhythm rightward axis 88 bpm- No acute changes  EKG 01/15/2019 Normal sinus rhythm left bundle branch block 99 bpm  Cath 11/24/2018  Left heart cath via right radial using real-time vascular ultrasound for access.  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  Totally occluded codominant right coronary supplied by right to right and left to right collaterals. PDA is diffusely diseased. Just after the origin from the right coronary the PDA may be graftable.  Tubular 30 to 40% left main.  50% proximal LAD followed by 60 to 70% proximal LAD.  First diagonal with severe proximal disease  Codominant circumflex with occlusion of the first marginal, small second and third marginals, 85% mid to distal circumflex before the large branching fourth obtuse marginal. Distal circumflex is then followed by 99% stenosis proximal to the circumflex PDA/left ventricular branch (likely too small to graft.  By echo LVEF is 25 to 30%. LVEDP is . Findings consistent with compensated acute onchronic systolic and diastolic heart failure.The patient has had a rapid turnaround from acute pulmonary edema.  Complex anatomy. Refer for surgical consultation to determine if he is a candidate for multivessel bypass.  Echocardiogram10/21/2020 1. Left ventricular ejection fraction, by visual estimation, is 20 to 25%. The left ventricle has severely decreased function. There is no left ventricular hypertrophy. 2. Abnormal septal motion consistent with left bundle branch block. 3. Global right ventricle has normal systolic function.The right ventricular size is normal. No increase in right ventricular wall thickness. 4. Left atrial size was normal. 5. Right atrial size was normal. 6. 27 mm bioprosthetic vale is present in the mitral position. 7. The mitral valve has been repaired/replaced. No evidence of mitral valve regurgitation. No evidence of mitral stenosis. 8. The tricuspid valve is normal in structure. Tricuspid valve regurgitation is not demonstrated. 9. The aortic valve is tricuspid. Aortic valve regurgitation is mild. No evidence of aortic valve sclerosis or stenosis. 10. The pulmonic valve was normal in  structure. Pulmonic valve regurgitation is not visualized. 11. Normal pulmonary artery systolic pressure. 12. The inferior vena cava is normal in size with <50% respiratory variability, suggesting right atrial pressure of 8 mmHg. 13. MagnaEase 57mm bioprosthetic valve is well seated and functioning normally. 14. There is residual posterior papillary muscle from native valve removal that is  freely mobile. This is not a vegetation. Personally reviewed TEE intraoperatively and this finding was seen on final image.   Assessment & Plan   1.  Ischemic cardiomyopathy/Lower extremity edema -history of MI, echocardiogram 12/23/2018 showed an estimated EF of 20 to 25%. Weight today 190.6 pounds with dry weight of around 184 pounds. Blood pressure at home130s over70s. Continue losartan 50 mg tablet daily  Continetorsemide10 mg tablet dailyas needed for weight gain of 3 pounds in a day or 5 pounds or more in 3 days. Contine spironolactone25 daily  Elevate lower extremities when not active Wearing lower extremity support stockings  Coronary artery disease-no chest pain today. Cardiac catheterization 11/24/2018 showed multivessel disease. CABG x3 on 11/30/2018 Continue aspirin 81 mg tablet daily Heart healthy low-sodium diet-salty 6-reviewed Increase physical activity as tolerated  Postoperative A. fib-Hr today 92 bpm Decrease amiodarone to 100 mg tablet daily for 1 week then discontinue. Care manager Ester Skiff RN expresses understanding  Hyperlipidemia-11/25/2018: VLDL 31 01/27/2019: Cholesterol, Total 117; HDL 44; LDL Chol Calc (NIH) 49; Triglycerides 135 Continue rosuvastatin20 mg daily Increase physical activity as tolerated Heart healthy low-sodium high-fiber diet  Essential hypertension-BP today 127/69.    At home BP 130s over 70s Continue losartan 50 mg tablet daily Heart healthy low-sodium diet Crease physical activity as tolerated  Disposition: Follow-up with Dr. Rennis Golden  in 3 months and advanced heart failure clinic on 03/22/2019.    Thomasene Ripple. Mylinh Cragg NP-C       [facility] [address] Office [phone] Fax [phone]

## 2019-03-18 ENCOUNTER — Encounter: Payer: Self-pay | Admitting: General Practice

## 2019-03-18 ENCOUNTER — Other Ambulatory Visit: Payer: Self-pay

## 2019-03-18 ENCOUNTER — Ambulatory Visit (INDEPENDENT_AMBULATORY_CARE_PROVIDER_SITE_OTHER): Payer: Medicare Other | Admitting: General Practice

## 2019-03-18 VITALS — BP 127/69 | HR 92 | Temp 98.1°F | Wt 190.6 lb

## 2019-03-18 DIAGNOSIS — I480 Paroxysmal atrial fibrillation: Secondary | ICD-10-CM

## 2019-03-18 DIAGNOSIS — I255 Ischemic cardiomyopathy: Secondary | ICD-10-CM

## 2019-03-18 DIAGNOSIS — I25810 Atherosclerosis of coronary artery bypass graft(s) without angina pectoris: Secondary | ICD-10-CM

## 2019-03-18 DIAGNOSIS — R600 Localized edema: Secondary | ICD-10-CM

## 2019-03-18 DIAGNOSIS — Z79899 Other long term (current) drug therapy: Secondary | ICD-10-CM

## 2019-03-18 DIAGNOSIS — Z951 Presence of aortocoronary bypass graft: Secondary | ICD-10-CM

## 2019-03-18 DIAGNOSIS — E785 Hyperlipidemia, unspecified: Secondary | ICD-10-CM

## 2019-03-18 DIAGNOSIS — I10 Essential (primary) hypertension: Secondary | ICD-10-CM

## 2019-03-18 MED ORDER — AMIODARONE HCL 100 MG PO TABS: 100.0000 mg | ORAL_TABLET | Freq: Every day | ORAL | 0 refills | Status: DC

## 2019-03-18 MED ORDER — POTASSIUM CHLORIDE ER 10 MEQ PO TBCR: 10.0000 meq | EXTENDED_RELEASE_TABLET | Freq: Every day | ORAL | 3 refills | Status: DC | PRN

## 2019-03-18 NOTE — Patient Instructions (Signed)
Medication Instructions:  TAKE TORSEMIDE TODAY WHEN YOU GET HOME THEN OPNLY WHEN YOUR WEIGHT IS >187 lb  TAKE POTASSIUM ONLY WHEN TAKING TORSEMIDE ON 2nd DAY  TAKE AMIODARONE 100MG  (1/2 TAB) FOR ONE WEEK THEN STOP 03-25-2019 If you need a refill on your cardiac medications before your next appointment, please call your pharmacy.  Reduce your risk of getting COVID-19 With your heart disease it is especially important for people at increased risk of severe illness from COVID-19, and those who live with them, to protect themselves from getting COVID-19. The best way to protect yourself and to help reduce the spread of the virus that causes COVID-19 is to: 03-27-2019 Limit your interactions with other people as much as possible. . Take precautions to prevent getting COVID-19 when you do interact with others. If you start feeling sick and think you may have COVID-19, get in touch with your healthcare provider within 24 hours.  Follow-Up: IN 3 months In Person K. Marland Kitchen Hilty, MD.    At [facility], you and your health needs are our priority.  As part of our continuing mission to provide you with exceptional heart care, we have created designated Provider Care Teams.  These Care Teams include your primary Cardiologist (physician) and Advanced Practice Providers (APPs -  Physician Assistants and Nurse Practitioners) who all work together to provide you with the care you need, when you need it.  Thank you for choosing [facility] at [facility]!!

## 2019-03-22 ENCOUNTER — Ambulatory Visit (HOSPITAL_COMMUNITY)
Admission: RE | Admit: 2019-03-22 | Discharge: 2019-03-22 | Disposition: A | Discharge status: Home / Self Care | Payer: Medicare Other | Source: Ambulatory Visit | Attending: Internal Medicine | Admitting: Internal Medicine

## 2019-03-22 ENCOUNTER — Encounter (HOSPITAL_COMMUNITY): Payer: Self-pay | Admitting: Internal Medicine

## 2019-03-22 ENCOUNTER — Other Ambulatory Visit: Payer: Self-pay

## 2019-03-22 VITALS — BP 140/82 | HR 97 | Wt 190.6 lb

## 2019-03-22 DIAGNOSIS — Z951 Presence of aortocoronary bypass graft: Secondary | ICD-10-CM | POA: Insufficient documentation

## 2019-03-22 DIAGNOSIS — I5042 Chronic combined systolic (congestive) and diastolic (congestive) heart failure: Secondary | ICD-10-CM | POA: Diagnosis present

## 2019-03-22 DIAGNOSIS — Z880 Allergy status to penicillin: Secondary | ICD-10-CM | POA: Diagnosis not present

## 2019-03-22 DIAGNOSIS — Z79899 Other long term (current) drug therapy: Secondary | ICD-10-CM | POA: Insufficient documentation

## 2019-03-22 DIAGNOSIS — I5022 Chronic systolic (congestive) heart failure: Secondary | ICD-10-CM | POA: Insufficient documentation

## 2019-03-22 DIAGNOSIS — Z87891 Personal history of nicotine dependence: Secondary | ICD-10-CM | POA: Insufficient documentation

## 2019-03-22 DIAGNOSIS — I2510 Atherosclerotic heart disease of native coronary artery without angina pectoris: Secondary | ICD-10-CM | POA: Insufficient documentation

## 2019-03-22 DIAGNOSIS — Z952 Presence of prosthetic heart valve: Secondary | ICD-10-CM | POA: Diagnosis not present

## 2019-03-22 DIAGNOSIS — J449 Chronic obstructive pulmonary disease, unspecified: Secondary | ICD-10-CM | POA: Insufficient documentation

## 2019-03-22 DIAGNOSIS — I252 Old myocardial infarction: Secondary | ICD-10-CM | POA: Diagnosis not present

## 2019-03-22 DIAGNOSIS — Z8249 Family history of ischemic heart disease and other diseases of the circulatory system: Secondary | ICD-10-CM | POA: Diagnosis not present

## 2019-03-22 DIAGNOSIS — I110 Hypertensive heart disease with heart failure: Secondary | ICD-10-CM | POA: Insufficient documentation

## 2019-03-22 DIAGNOSIS — R600 Localized edema: Secondary | ICD-10-CM

## 2019-03-22 DIAGNOSIS — I255 Ischemic cardiomyopathy: Secondary | ICD-10-CM | POA: Insufficient documentation

## 2019-03-22 DIAGNOSIS — Z7982 Long term (current) use of aspirin: Secondary | ICD-10-CM | POA: Diagnosis not present

## 2019-03-22 DIAGNOSIS — H269 Unspecified cataract: Secondary | ICD-10-CM | POA: Insufficient documentation

## 2019-03-22 LAB — BASIC METABOLIC PANEL
Anion gap: 12 (ref 5–15)
BUN: 16 mg/dL (ref 8–23)
CO2: 27 mmol/L (ref 22–32)
Calcium: 8.9 mg/dL (ref 8.9–10.3)
Chloride: 102 mmol/L (ref 98–111)
Creatinine, Ser: 1.07 mg/dL (ref 0.61–1.24)
GFR calc Af Amer: >60 mL/min (ref >60)
GFR calc non Af Amer: >60 mL/min (ref >60)
Glucose, Bld: 133 mg/dL — ABNORMAL HIGH (ref 70–99)
Potassium: 3.3 mmol/L — ABNORMAL LOW (ref 3.5–5.1)
Sodium: 141 mmol/L (ref 135–145)

## 2019-03-22 LAB — BRAIN NATRIURETIC PEPTIDE: B Natriuretic Peptide: 332.4 pg/mL — ABNORMAL HIGH (ref 0.0–100.0)

## 2019-03-22 MED ORDER — CARVEDILOL 3.125 MG PO TABS: 3.1250 mg | ORAL_TABLET | Freq: Two times a day (BID) | ORAL | 3 refills | Status: DC

## 2019-03-22 MED ORDER — POTASSIUM CHLORIDE ER 10 MEQ PO TBCR: 20.0000 meq | EXTENDED_RELEASE_TABLET | Freq: Every day | ORAL | 3 refills | Status: DC

## 2019-03-22 MED ORDER — TORSEMIDE 20 MG PO TABS: 20.0000 mg | ORAL_TABLET | Freq: Every day | ORAL | 3 refills | Status: DC

## 2019-03-22 NOTE — Progress Notes (Addendum)
Advanced Heart Failure Clinic Note   Referring Physician: PCP: Patient, No Pcp Per PCP-Cardiologist: Chrystie Nose, MD   HPI:  Curtis [name] is a 69 y/o male with CAD s/p CABG and MVR in 9/20, COPD, chronic systolic HF due to iCM EF 20-25% in 10/20,  He was admitted in 9/20 with NSTEMI found to have severe 3v CAD. Underwent CABG x3 (LIMA to LAD, SVG to OM, SVG to diagonal), MR status post MVR on 11/30/2018 with complicated hospital course following this surgery including postoperative cardiogenic shock in setting of EF of 25% requiring inotrope therapy and Impella placement.He also had a left hemothorax evacuation and postoperative atrial fibrillation.  He was admitted to the hospital again on 01/01/2019 with near syncope and was found to be hypotensive with a blood pressure of 84/62. His torsemide was discontinued along with his Entresto and spironolactone. He was to use torsemide as needed for volume overload. Repeat echocardiogram showed an LVEF of 20-25% with a stable bioprosthetic valve.   He had been following with Edd Fabian in the [facility] offices.   Says he is doing well. Denies SOB, CP, orthopnea or PND. Can go up 13 steps without problem. Mild Dyspnea if he goes too fast. Very poor vision due to cataracts. Weighing daily. Weight ranged 181-190. Only taking torsemide about 1-2x/week based on weight. Not compliant with low sodium diet. Followed by Caregivers. He is hesitant to go back on Entresto due to low BP. No longer smoking. SBP at home 130-157.  Past Medical History:  Diagnosis Date  . Coronary artery disease   . Elevated troponin 11/25/2018  . Tobacco use     Current Outpatient Medications  Medication Sig Dispense Refill  . amiodarone (PACERONE) 100 MG tablet Take 1 tablet (100 mg total) by mouth daily. FOR ONE WEEK 1-14 THRU 1-21 THEN STOP 7 tablet 0  . aspirin EC 81 MG tablet Take 81 mg by mouth daily.    Marland Kitchen guaiFENesin (MUCINEX) 600 MG 12 hr tablet Take 600  mg by mouth as needed.    Marland Kitchen losartan (COZAAR) 50 MG tablet Take 50 mg by mouth daily.    . Multiple Vitamin (MULTIVITAMIN WITH MINERALS) TABS tablet Take 1 tablet by mouth daily.    . pantoprazole (PROTONIX) 40 MG tablet Take 40 mg by mouth daily.    . potassium chloride (KLOR-CON) 10 MEQ tablet Take 1 tablet (10 mEq total) by mouth daily as needed (with torsemide on 2nd day+ only). 90 tablet 3  . rosuvastatin (CRESTOR) 20 MG tablet Take 20 mg by mouth daily.    Marland Kitchen spironolactone (ALDACTONE) 25 MG tablet Take 1 tablet (25 mg total) by mouth daily. 90 tablet 3  . torsemide (DEMADEX) 10 MG tablet Take 1 tablet (10 mg total) by mouth daily as needed (take 10mg  x3d then For weight gain 3 pounds or more in 1 day or 5 pounds or more in 3 days). 30 tablet 6   No current facility-administered medications for this encounter.    Allergies  Allergen Reactions  . Penicillins Rash and Other (See Comments)    Tolerated cefepime 12/2018  Did it involve swelling of the face/tongue/throat, SOB, or low BP? Unknown Did it involve sudden or severe rash/hives, skin peeling, or any reaction on the inside of your mouth or nose? Yes Did you need to seek medical attention at a hospital or doctor's office? Unknown When did it last happen? childhood If all above answers are "NO", may proceed with cephalosporin  use.       Social History   Socioeconomic History  . Marital status: Divorced    Spouse name: Not on file  . Number of children: Not on file  . Years of education: Not on file  . Highest education level: Not on file  Occupational History  . Not on file  Tobacco Use  . Smoking status: Former Smoker    Packs/day: 0.25    Types: Cigarettes  . Smokeless tobacco: Never Used  Substance and Sexual Activity  . Alcohol use: Never  . Drug use: Never  . Sexual activity: Not Currently  Other Topics Concern  . Not on file  Social History Narrative  . Not on file   Social Determinants of Health     Financial Resource Strain: Low Risk   . Difficulty of Paying Living Expenses: Not hard at all  Food Insecurity: No Food Insecurity  . Worried About Programme researcher, broadcasting/film/video in the Last Year: Never true  . Ran Out of Food in the Last Year: Never true  Transportation Needs: No Transportation Needs  . Lack of Transportation (Medical): No  . Lack of Transportation (Non-Medical): No  Physical Activity: Sufficiently Active  . Days of Exercise per Week: 3 days  . Minutes of Exercise per Session: 60 min  Stress: No Stress Concern Present  . Feeling of Stress : Not at all  Social Connections: Somewhat Isolated  . Frequency of Communication with Friends and Family: More than three times a week  . Frequency of Social Gatherings with Friends and Family: Never  . Attends Religious Services: Never  . Active Member of Clubs or Organizations: Yes  . Attends Banker Meetings: Never  . Marital Status: Divorced  Catering manager Violence: Not At Risk  . Fear of Current or Ex-Partner: No  . Emotionally Abused: No  . Physically Abused: No  . Sexually Abused: No      Family History  Problem Relation Age of Onset  . Heart attack Father   . Heart attack Mother     Vitals:   03/22/19 0941  BP: 140/82  Pulse: 97  SpO2: 97%  Weight: 86.5 kg (190 lb 9.6 oz)     PHYSICAL EXAM: General:  Well appearing. No respiratory difficulty HEENT: normal Neck: supple. JVP 9 Carotids 2+ bilat; no bruits. No lymphadenopathy or thyromegaly appreciated. Cor: PMI nondisplaced. Regular rate & rhythm. No rubs, gallops or murmurs. Lungs: clear Abdomen: soft, nontender, nondistended. No hepatosplenomegaly. No bruits or masses. Good bowel sounds. Extremities: no cyanosis, clubbing, rash, 3+ edema Neuro: alert & oriented x 3, cranial nerves grossly intact. moves all 4 extremities w/o difficulty. Affect pleasant.  ECG: NSR 94 LBBB Personally reviewed  ReDS 45%    ASSESSMENT & PLAN:  1. Choronic  systolic HF due to iCM - Echo 08/28/9483 EF  20-25%.  - NYHA II - Volume status elevated. ReDS 45% - Will increase torsemide to 20 daily + kdur 20 - Continue losartan 50 mg tablet daily. Once volume status better will likely rechallenge with Entresto  - Continespironolactone25daily  - Add carvedilol 3.125 bid - Eventually will need Farxiga - Discussed possible need for ICD +/- CRT. Will get cMRI to recheck EF and degree of scarring - Reinforced need for daily weights and reviewed use of sliding scale diuretics. - Refer to HF PharmD program to see q2 weeks x 3 to help with med titration and volume management   2. CAD - Cardiac catheterization 11/24/2018 showed  multivessel disease. CABG x3 on 11/30/2018 - Continue aspirin and statin  - No s/s ischemia  3.  Postoperative A. Fib - in NSR. Can stop amio  4. Essential hypertension - BP up management as above  5. Cataracts - ok to proceed with cataract extraction  6. COPD - has quit smoking   7. S/p MVR - stable by echo.  - aware of need for SBE prophylaxis.   Total time spent 40 minutes. Over half that time spent discussing above.   Glori Bickers, MD 03/22/19

## 2019-03-22 NOTE — Patient Instructions (Signed)
STOP Amiodarone  Start Carvedilol 3.125 mg Twice daily   Change Torsemide to 20 mg daily, new prescription for 20 mg tablet has been sent to your pharmacy  Change Potassium to 20 meq daily  Your physician has requested that you have a cardiac MRI. Cardiac MRI uses a computer to create images of your heart as its beating, producing both still and moving pictures of your heart and major blood vessels. For further information please visit [email or web address]. Please follow the instruction sheet given to you today for more information.  ONCE THIS IS APPROVED BY YOUR INSURANCE COMPANY RADIOLOGY WILL CALL YOU TO SCHEDULE THIS.  Please wear your compression hose daily, place them on as soon as you get up in the morning and remove before you go to bed at night.  Labs done today, your results will be available on MyChart, we will contact you for abnormal results  Please follow up with our heart failure pharmacist every 2 weeks for 3 visits  Your physician recommends that you schedule a follow-up appointment in: 4 months  If you have any questions or concerns before your next appointment please send [place] a message through [place] or call our office at [phone].  At the Advanced Heart Failure Clinic, you and your health needs are our priority. As part of our continuing mission to provide you with exceptional heart care, we have created designated Provider Care Teams. These Care Teams include your primary Cardiologist (physician) and Advanced Practice Providers (APPs- Physician Assistants and Nurse Practitioners) who all work together to provide you with the care you need, when you need it.   You may see any of the following providers on your designated Care Team at your next follow up: Marland Kitchen Dr Arvilla Meres . Dr Marca Ancona . Tonye Becket, NP . Robbie Lis, PA . Karle Plumber, PharmD   Please be sure to bring in all your medications bottles to every appointment.

## 2019-03-22 NOTE — Progress Notes (Signed)
ReDS Vest / Clip - 03/22/19 1000      ReDS Vest / Clip   Station Marker  C    Ruler Value  30.5    ReDS Value Range  (!) High volume overload    ReDS Actual Value  45    Anatomical Comments  sitting

## 2019-03-24 ENCOUNTER — Telehealth (HOSPITAL_COMMUNITY): Payer: Self-pay | Admitting: *Deleted

## 2019-03-24 MED ORDER — POTASSIUM CHLORIDE ER 10 MEQ PO TBCR: 20.0000 meq | EXTENDED_RELEASE_TABLET | Freq: Two times a day (BID) | ORAL | 3 refills | Status: DC

## 2019-03-24 NOTE — Telephone Encounter (Signed)
-----   Message from Daniel R Bensimhon, MD sent at 03/22/2019 11:49 AM EST ----- K low. Increase potassium to 40 daily. Take 60 extra today.  Repeat 1 week  

## 2019-03-24 NOTE — Telephone Encounter (Signed)
Called and spoke w/pt's nurse aide, she is aware and verbalized understanding. She has documented change and will notify his nurse so she can make the changes in his pill box. Repeat labs will be check at OV 1/28

## 2019-03-24 NOTE — Telephone Encounter (Signed)
-----   Message from Dolores Patty, MD sent at 03/22/2019 11:49 AM EST ----- K low. Increase potassium to 40 daily. Take 60 extra today.  Repeat 1 week

## 2019-03-25 ENCOUNTER — Encounter (HOSPITAL_COMMUNITY): Payer: Self-pay

## 2019-03-29 ENCOUNTER — Telehealth: Payer: Medicare Other | Admitting: Adult Health

## 2019-04-01 ENCOUNTER — Ambulatory Visit (HOSPITAL_COMMUNITY)
Admission: RE | Admit: 2019-04-01 | Discharge: 2019-04-01 | Disposition: A | Discharge status: Home / Self Care | Payer: Medicare Other | Source: Ambulatory Visit | Attending: Cardiology | Admitting: Cardiology

## 2019-04-01 ENCOUNTER — Other Ambulatory Visit: Payer: Self-pay

## 2019-04-01 VITALS — BP 106/66 | HR 79 | Wt 180.8 lb

## 2019-04-01 DIAGNOSIS — I252 Old myocardial infarction: Secondary | ICD-10-CM | POA: Diagnosis not present

## 2019-04-01 DIAGNOSIS — Z7901 Long term (current) use of anticoagulants: Secondary | ICD-10-CM | POA: Diagnosis not present

## 2019-04-01 DIAGNOSIS — I9789 Other postprocedural complications and disorders of the circulatory system, not elsewhere classified: Secondary | ICD-10-CM | POA: Diagnosis not present

## 2019-04-01 DIAGNOSIS — R600 Localized edema: Secondary | ICD-10-CM | POA: Diagnosis not present

## 2019-04-01 DIAGNOSIS — I5042 Chronic combined systolic (congestive) and diastolic (congestive) heart failure: Secondary | ICD-10-CM

## 2019-04-01 DIAGNOSIS — I2510 Atherosclerotic heart disease of native coronary artery without angina pectoris: Secondary | ICD-10-CM | POA: Insufficient documentation

## 2019-04-01 DIAGNOSIS — J449 Chronic obstructive pulmonary disease, unspecified: Secondary | ICD-10-CM | POA: Insufficient documentation

## 2019-04-01 DIAGNOSIS — I110 Hypertensive heart disease with heart failure: Secondary | ICD-10-CM | POA: Insufficient documentation

## 2019-04-01 DIAGNOSIS — Z87891 Personal history of nicotine dependence: Secondary | ICD-10-CM | POA: Insufficient documentation

## 2019-04-01 DIAGNOSIS — I5022 Chronic systolic (congestive) heart failure: Secondary | ICD-10-CM | POA: Insufficient documentation

## 2019-04-01 DIAGNOSIS — Z79899 Other long term (current) drug therapy: Secondary | ICD-10-CM | POA: Insufficient documentation

## 2019-04-01 DIAGNOSIS — Z951 Presence of aortocoronary bypass graft: Secondary | ICD-10-CM | POA: Diagnosis not present

## 2019-04-01 DIAGNOSIS — Z952 Presence of prosthetic heart valve: Secondary | ICD-10-CM | POA: Insufficient documentation

## 2019-04-01 LAB — BASIC METABOLIC PANEL
Anion gap: 10 (ref 5–15)
BUN: 24 mg/dL — ABNORMAL HIGH (ref 8–23)
CO2: 30 mmol/L (ref 22–32)
Calcium: 9 mg/dL (ref 8.9–10.3)
Chloride: 96 mmol/L — ABNORMAL LOW (ref 98–111)
Creatinine, Ser: 1.51 mg/dL — ABNORMAL HIGH (ref 0.61–1.24)
GFR calc Af Amer: 55 mL/min — ABNORMAL LOW (ref >60)
GFR calc non Af Amer: 47 mL/min — ABNORMAL LOW (ref >60)
Glucose, Bld: 111 mg/dL — ABNORMAL HIGH (ref 70–99)
Potassium: 4.5 mmol/L (ref 3.5–5.1)
Sodium: 136 mmol/L (ref 135–145)

## 2019-04-01 MED ORDER — TORSEMIDE 20 MG PO TABS: 10.0000 mg | ORAL_TABLET | Freq: Every day | ORAL | 3 refills | Status: DC

## 2019-04-01 MED ORDER — POTASSIUM CHLORIDE ER 10 MEQ PO TBCR: 20.0000 meq | EXTENDED_RELEASE_TABLET | Freq: Every day | ORAL | 3 refills | Status: DC

## 2019-04-01 NOTE — Progress Notes (Signed)
HPI:  [name] is a 69 y/o male with CAD s/p CABG and MVR in 9/20, COPD, chronic systolic HF due to iCM EF 20-25% in 10/20,     He was admitted in 9/20 with NSTEMI found to have severe 3v CAD. Underwent CABG x3 (LIMA to LAD, SVG to OM, SVG to diagonal), MR status post MVR on 3/47/4259 with complicated hospital course following this surgery including postoperative cardiogenic shock in setting of EF of 25% requiring inotrope therapy and Impella placement. He also had a left hemothorax evacuation and postoperative atrial fibrillation.     He was admitted to the hospital again on 01/01/2019 with near syncope and was found to be hypotensive with a blood pressure of 84/62.  His torsemide was discontinued along with his Entresto and spironolactone.  He was to use torsemide as needed for volume overload.  Repeat echocardiogram showed an LVEF of 20-25% with a stable bioprosthetic valve.     He had been following with Coletta Memos in the [facility] offices.     He recently followed up with Dr. Haroldine Laws. He said he was doing well. Denied SOB, CP, orthopnea or PND. Able to go up 13 steps without problem. Mild Dyspnea if he went too fast. Very poor vision due to cataracts. Weighing daily. Weight ranged 181-190 lb. He was only taking torsemide about 1-2x/week based on weight. Not compliant with low sodium diet. Followed by Caregivers. He states he is hesitant to go back on Entresto due to low BP. No longer smoking. SBP at home 130-157 mmHg.     Today he returns to HF clinic for pharmacist medication titration. At last visit with MD, carvedilol 3.125 mg BID, torsemide 20 mg daily, and potassium chloride 40 mEq daily were initiated. Overall he is feeling well today. No dizziness or lightheadedness. No fatigue, chest pain or palpitations. No SOB/DOE. He is now able to ambulate well without his walker. His Caregivers have been weighing him daily at home.  His weight is down 10 lbs from last clinic visit. His weight  in clinic is 180.8 lbs today (was 190.6 lbs last visit). Has 1+ bilateral lower extremity edema, although improved. He is wearing compression stockings today. No PND or orthopnea. ReDS 38% in clinic today (45% at last visit). His appetite is good. He is following a low sodium diet.     . Shortness of breath/dyspnea on exertion? no  . Orthopnea/PND? no . Edema? 1+ bilateral LEE - improved . Lightheadedness/dizziness? no . Daily weights at home? yes . Blood pressure/heart rate monitoring at home? yes . Following low-sodium/fluid-restricted diet? yes  HF Medications: Carvedilol 3.125 mg BID  Losartan 50mg  daily  Spironolactone 25 mg daily  Torsemide 20 mg daily  Potassium chloride 20 mg BID   Has the patient been experiencing any side effects to the medications prescribed?  no  Does the patient have any problems obtaining medications due to transportation or finances?   No prescription insurance. Only has Medicare A/B.  Understanding of regimen: fair Understanding of indications: good Potential of compliance: good Patient understands to avoid NSAIDs. Patient understands to avoid decongestants.    Pertinent Lab Values: . Serum creatinine 1.51, BUN 24, Potassium 4.5, Sodium 136, BNP 332.4 (03/22/19),   Vital Signs: . Weight: 180.8 lbs (last clinic weight: 190.6 lbs) . Blood pressure: 106/66  . Heart rate: 79   Assessment: 1.  Chronic systolic HF due to iCM  - Echo 12/23/2018 EF  20-25%.    - NYHA II,  Volume status mildly elevated on exam but improved. ReDS 38% (down from 45% on 03/22/19) -Vitals: BP low at 106/66, HR 79. Of note, patients BP at last clinic visit was 140/82. Decline in BP appears to coincide with torsemide resumption.  - Labs: Scr increased from 1.07 to 1.51, K 4.5. -Volume status improved. Will decrease torsemide to 10 mg daily. Decrease potassium chloride to 20 mEq daily. Repeat BMET/ReDS in 2 weeks.  - Continue carvedilol 3.125 mg bid  - Continue losartan 50  mg tablet daily. Patient is adamantly against Entresto rechallenge.  - Continue spironolactone 25 daily  - Would be good [place] candidate. No prescription insurance, so will need manufacturer's assistance.  - Have discussed possible need for ICD +/- CRT. Will get cMRI to recheck EF and degree of scarring  - Reinforced need for daily weights and reviewed use of sliding scale diuretics.     2. CAD  - Cardiac catheterization 11/24/2018 showed multivessel disease.  CABG x 3 on 11/30/2018  - Continue aspirin and statin  - No s/s ischemia     3.  Postoperative A. Fib  - in NSR. Amiodarone stopped.     4. Essential hypertension  - BP management as above; soft BP for the last week     5. COPD  - has quit smoking     6. S/p MVR  - stable by echo.  - aware of need for SBE prophylaxis.    Plan: 1) Medication changes: Based on clinical presentation, vital signs and recent labs will decrease torsemide to 10 mg daily and decrease potassium cl to 20 mEq daily.  2) Labs: Scr 1.51, K 4.5 3) Follow-up: 2 weeks in Pharmacy Clinic.    Karle Plumber, PharmD, BCPS, BCCP, CPP Heart Failure Clinic Pharmacist [phone]

## 2019-04-01 NOTE — Patient Instructions (Addendum)
It was a pleasure seeing you today!  MEDICATIONS: -We are changing your medications today -Decrease Torsemide to 10 mg (1/2 tab) daily. Monitor weight daily. If weight decreases by 3 lbs overnight or 5 labs in 1 week, decrease torsemide to 10 mg MWF.  -Call if you have questions about your medications.  LABS: -We will call you if your labs need attention.  NEXT APPOINTMENT: Return to clinic in 2 weeks with Pharmacy Clinic.  In general, to take care of your heart failure: -Limit your fluid intake to 2 Liters (half-gallon) per day.   -Limit your salt intake to ideally 2-3 grams (2000-3000 mg) per day. -Weigh yourself daily and record, and bring that "weight diary" to your next appointment.  (Weight gain of 2-3 pounds in 1 day typically means fluid weight.) -The medications for your heart are to help your heart and help you live longer.   -Please contact us before stopping any of your heart medications.  Call the clinic at [phone] with questions or to reschedule future appointments.

## 2019-04-07 ENCOUNTER — Telehealth (HOSPITAL_COMMUNITY): Payer: Self-pay | Admitting: Pharmacist

## 2019-04-07 NOTE — Telephone Encounter (Signed)
Spoke with patient's nurse Darral Dash). Mr. Trulock's weight increased from 182 to 186.2 lbs on 04/03/19. He is 188.6 lbs today. Will have him take torsemide 20 mg x2 days, then decrease back to 10 mg daily.   Karle Plumber, PharmD, BCPS, BCCP, CPP Heart Failure Clinic Pharmacist [phone]

## 2019-04-09 NOTE — Progress Notes (Signed)
HPI:  Curtis [name] is a 69 y/o male with CAD s/p CABG and MVR in 9/20, COPD, chronic systolic HF due to iCM EF 20-25% in 10/20,     He was admitted in 9/20 with NSTEMI found to have severe 3v CAD. Underwent CABG x3 (LIMA to LAD, SVG to OM, SVG to diagonal), MR status post MVR on 11/30/2018 with complicated hospital course following this surgery including postoperative cardiogenic shock in setting of EF of 25% requiring inotrope therapy and Impella placement. He also had a left hemothorax evacuation and postoperative atrial fibrillation.     He was admitted to the hospital again on 01/01/2019 with near syncope and was found to be hypotensive with a blood pressure of 84/62.  His torsemide was discontinued along with his Entresto and spironolactone.  He was to use torsemide as needed for volume overload.  Repeat echocardiogram showed an LVEF of 20-25% with a stable bioprosthetic valve.     He had been following with Edd Fabian in the [facility] offices.     He recently followed up with Dr. Gala Romney. He said he was doing well. Denied SOB, CP, orthopnea or PND. Able to go up 13 steps without problem. Mild Dyspnea if he went too fast. Very poor vision due to cataracts. Weighing daily. Weight ranged 181-190 lb. He was only taking torsemide about 1-2x/week based on weight. Not compliant with low sodium diet. Followed by Caregivers. He states he is hesitant to go back on Entresto due to low BP. No longer smoking. SBP at home 130-157 mmHg.     Recently he returned to HF clinic for pharmacist medication titration. At last visit with MD, carvedilol 3.125 mg BID, torsemide 20 mg daily, and potassium chloride 40 mEq daily were initiated. No dizziness or lightheadedness. No fatigue, chest pain or palpitations. No SOB/DOE. He was able to ambulate well without his walker. His Caregivers had been weighing him daily at home.  His weight was down 10 lbs from last clinic visit. His weight in clinic was 180.8 lbs (was 190.6  lbs last visit). Has 1+ bilateral lower extremity edema, although improved. He was wearing compression stockings. No PND or orthopnea. ReDS 38% in clinic (45% at last visit). His appetite was good. He was following a low sodium diet.   Today he returns to HF clinic for pharmacist medication titration. At last visit with pharmacy clinic, torsemide was decreased to 10 mg daily and potassium chloride was decreased to 20 mEq daily. Unfortunately he is volume overloaded on exam again today likely due to dietary non-compliance. He is up 10 lbs from last visit and has 2+ bilateral LEE. ReDs is 53% (up from 38% last visit). Otherwise he states he is doing well. No dizziness, lightheadedness, chest pain or palpitations. Can walk "from produce section of grocery store to ice cream section without stopping". Is not SOB at rest. His appetite is fine. He stated he has been trying to "avoid [place] food and [place] food" to decrease his salt intake. His nurse Darral Dash says that his diet is very poor and he eats a lot of fried foods. I also think he is non-compliant with fluid restriction, drinking 3 ginger ales and 1-2 glasses of water per day. Takes all medications as prescribed with help from home nurses. Working on getting medication insurance.    HF Medications: Carvedilol 3.125 mg BID  Losartan 50mg  daily  Spironolactone 25 mg daily  Torsemide 10 mg daily  Potassium chloride 20 mg daily  Has  the patient been experiencing any side effects to the medications prescribed?  no  Does the patient have any problems obtaining medications due to transportation or finances?   No prescription insurance. Only has Medicare A/B.  Understanding of regimen: fair Understanding of indications: good Potential of compliance: good Patient understands to avoid NSAIDs. Patient understands to avoid decongestants.    Pertinent Lab Values: . Serum creatinine 1.39, BUN 31, Potassium 5.2, Sodium 135, BNP 139.1   Vital  Signs: . Weight: 190.4 lbs (last clinic weight: 180.8 lbs) . Blood pressure: 110/78 . Heart rate: 79   Assessment: 1.  Chronic systolic HF due to iCM  - Echo 12/23/2018 EF  20-25%.    - NYHA II, Volume status elevated. Up 10 lbs from last visit, 1+ Bilateral LEE, ReDS 53% (up from 38% on 04/01/19) -Vitals: BP 110/78, HR 79.  - Labs: Scr decreased from 1.51 to 1.39, K 5.2 - Given elevated volume status, will increase torsemide to 20 mg daily. Although K is elevated at 5.2, will keep potassium chloride at 20 mEq daily since increasing torsemide (previously required potassium chloride 20 mEq BID when on torsemide 20 mg daily). Repeat BMET in 10 days. Will have him follow up in 2 weeks with NP/PA to assess volume status. - Continue carvedilol 3.125 mg BID - Continue losartan 50 mg daily. Patient is adamantly against Entresto rechallenge. Likely would not be able to change to [facility] regardless given low blood pressures.  - Continue spironolactone 25 daily  - Would be good [place] candidate. No prescription insurance, so will need manufacturer's assistance.  - Have discussed possible need for ICD +/- CRT. Will get cMRI to recheck EF and degree of scarring  - Reinforced need for daily weights and reviewed importance of low salt diet and fluid restriction.     2. CAD  - Cardiac catheterization 11/24/2018 showed multivessel disease.  CABG x 3 on 11/30/2018  - Continue aspirin and statin  - No s/s ischemia     3.  Postoperative A. Fib  - in NSR. Amiodarone stopped.     4. Essential hypertension  - BP management as above; soft BP for the last week     5. COPD  - has quit smoking     6. S/p MVR  - stable by echo.  - aware of need for SBE prophylaxis.    Plan: 1) Medication changes: Based on clinical presentation, vital signs and recent labs will increase torsemide to 20 mg daily.  2) Follow-up: 2 weeks with NP/PA   Audry Riles, PharmD, BCPS, BCCP, CPP Heart Failure Clinic  Pharmacist [phone]

## 2019-04-13 ENCOUNTER — Ambulatory Visit (HOSPITAL_COMMUNITY): Admission: RE | Admit: 2019-04-13 | Payer: Medicare Other | Source: Ambulatory Visit

## 2019-04-13 ENCOUNTER — Other Ambulatory Visit (HOSPITAL_COMMUNITY): Payer: Medicare Other

## 2019-04-13 ENCOUNTER — Other Ambulatory Visit (HOSPITAL_COMMUNITY): Payer: Self-pay

## 2019-04-13 DIAGNOSIS — I5042 Chronic combined systolic (congestive) and diastolic (congestive) heart failure: Secondary | ICD-10-CM

## 2019-04-13 NOTE — Progress Notes (Signed)
bmet  

## 2019-04-15 ENCOUNTER — Other Ambulatory Visit: Payer: Self-pay

## 2019-04-15 ENCOUNTER — Ambulatory Visit (HOSPITAL_COMMUNITY)
Admission: RE | Admit: 2019-04-15 | Discharge: 2019-04-15 | Disposition: A | Discharge status: Home / Self Care | Payer: Medicare Other | Source: Ambulatory Visit | Attending: Cardiology | Admitting: Cardiology

## 2019-04-15 VITALS — BP 110/78 | HR 79 | Wt 190.4 lb

## 2019-04-15 DIAGNOSIS — Z952 Presence of prosthetic heart valve: Secondary | ICD-10-CM | POA: Insufficient documentation

## 2019-04-15 DIAGNOSIS — Z7982 Long term (current) use of aspirin: Secondary | ICD-10-CM | POA: Diagnosis not present

## 2019-04-15 DIAGNOSIS — I110 Hypertensive heart disease with heart failure: Secondary | ICD-10-CM | POA: Diagnosis present

## 2019-04-15 DIAGNOSIS — Z87891 Personal history of nicotine dependence: Secondary | ICD-10-CM | POA: Insufficient documentation

## 2019-04-15 DIAGNOSIS — Z951 Presence of aortocoronary bypass graft: Secondary | ICD-10-CM | POA: Diagnosis not present

## 2019-04-15 DIAGNOSIS — I5022 Chronic systolic (congestive) heart failure: Secondary | ICD-10-CM | POA: Insufficient documentation

## 2019-04-15 DIAGNOSIS — I9789 Other postprocedural complications and disorders of the circulatory system, not elsewhere classified: Secondary | ICD-10-CM | POA: Insufficient documentation

## 2019-04-15 DIAGNOSIS — J449 Chronic obstructive pulmonary disease, unspecified: Secondary | ICD-10-CM | POA: Diagnosis not present

## 2019-04-15 DIAGNOSIS — I25810 Atherosclerosis of coronary artery bypass graft(s) without angina pectoris: Secondary | ICD-10-CM | POA: Diagnosis not present

## 2019-04-15 DIAGNOSIS — Z79899 Other long term (current) drug therapy: Secondary | ICD-10-CM | POA: Diagnosis not present

## 2019-04-15 DIAGNOSIS — I5042 Chronic combined systolic (congestive) and diastolic (congestive) heart failure: Secondary | ICD-10-CM

## 2019-04-15 LAB — BASIC METABOLIC PANEL
Anion gap: 11 (ref 5–15)
BUN: 31 mg/dL — ABNORMAL HIGH (ref 8–23)
CO2: 25 mmol/L (ref 22–32)
Calcium: 9.2 mg/dL (ref 8.9–10.3)
Chloride: 99 mmol/L (ref 98–111)
Creatinine, Ser: 1.39 mg/dL — ABNORMAL HIGH (ref 0.61–1.24)
GFR calc Af Amer: >60 mL/min (ref >60)
GFR calc non Af Amer: 52 mL/min — ABNORMAL LOW (ref >60)
Glucose, Bld: 143 mg/dL — ABNORMAL HIGH (ref 70–99)
Potassium: 5.2 mmol/L — ABNORMAL HIGH (ref 3.5–5.1)
Sodium: 135 mmol/L (ref 135–145)

## 2019-04-15 LAB — BRAIN NATRIURETIC PEPTIDE: B Natriuretic Peptide: 139.1 pg/mL — ABNORMAL HIGH (ref 0.0–100.0)

## 2019-04-15 MED ORDER — TORSEMIDE 20 MG PO TABS: 20.0000 mg | ORAL_TABLET | Freq: Every day | ORAL | 3 refills | Status: DC

## 2019-04-15 MED ORDER — POTASSIUM CHLORIDE ER 10 MEQ PO TBCR: 20.0000 meq | EXTENDED_RELEASE_TABLET | Freq: Every day | ORAL | 3 refills | Status: DC

## 2019-04-15 MED ORDER — POTASSIUM CHLORIDE ER 10 MEQ PO TBCR: 20.0000 meq | EXTENDED_RELEASE_TABLET | Freq: Two times a day (BID) | ORAL | 3 refills | Status: DC

## 2019-04-15 NOTE — Patient Instructions (Addendum)
It was a pleasure seeing you today!  MEDICATIONS: -We are changing your medications today -Increase torsemide to 20 mg daily.  -Call if you have questions about your medications.  LABS: -We will call you if your labs need attention.  NEXT APPOINTMENT: Return to clinic in 2 weeks with NP Visit.   In general, to take care of your heart failure: -Limit your fluid intake to 2 Liters (half-gallon) per day.   -Limit your salt intake to ideally 2-3 grams (2000-3000 mg) per day. -Weigh yourself daily and record, and bring that "weight diary" to your next appointment.  (Weight gain of 2-3 pounds in 1 day typically means fluid weight.) -The medications for your heart are to help your heart and help you live longer.   -Please contact us before stopping any of your heart medications.  Call the clinic at [phone] with questions or to reschedule future appointments.

## 2019-04-20 LAB — BLOOD GAS, ARTERIAL
Acid-Base Excess: 3.1 mmol/L — ABNORMAL HIGH (ref 0.0–2.0)
Bicarbonate: 27 mmol/L (ref 20.0–28.0)
Drawn by: 336831
FIO2: 0.21
O2 Saturation: 91.9 %
Patient temperature: 98.6
pCO2 arterial: 40.7 mmHg (ref 32.0–48.0)
pH, Arterial: 7.437 (ref 7.350–7.450)
pO2, Arterial: 63.9 mmHg — ABNORMAL LOW (ref 83.0–108.0)

## 2019-04-21 ENCOUNTER — Telehealth (HOSPITAL_COMMUNITY): Payer: Self-pay

## 2019-04-21 NOTE — Telephone Encounter (Signed)
Received call from Ester the Nurse Navigator that wanted to discuss patient weight gain. Pt was seen in office on 2/11 with a weight of 190.  Today he is 193.6lb.  Pt did not know if he had any complaints with this weight.  I called patients home and spoke with his care giver [name] and she reports that he does not have any changes since his last visit and no complaints.  Denies sob or swelling.  He is taking all meds as prescribed.  I reminded her to adhering to a heart healthy diet and monitoring fluid intake.  Please advise if any interventions needed.

## 2019-04-21 NOTE — Telephone Encounter (Signed)
Called both Ester and Caregiver and made her aware per NP, we will not intervene as patient is feeling well. Advised to call office if patients weight continues to go up and/or experiences SOB or swelling. Verbalized understanding.

## 2019-04-23 LAB — COOXEMETRY PANEL
Carboxyhemoglobin: 1.7 % — ABNORMAL HIGH (ref 0.5–1.5)
Methemoglobin: 1.1 % (ref 0.0–1.5)
O2 Saturation: 98.6 %

## 2019-04-27 ENCOUNTER — Ambulatory Visit (HOSPITAL_COMMUNITY)
Admission: RE | Admit: 2019-04-27 | Discharge: 2019-04-27 | Disposition: A | Discharge status: Home / Self Care | Payer: Medicare Other | Source: Ambulatory Visit | Attending: Internal Medicine | Admitting: Internal Medicine

## 2019-04-27 ENCOUNTER — Other Ambulatory Visit: Payer: Self-pay

## 2019-04-27 ENCOUNTER — Other Ambulatory Visit (HOSPITAL_COMMUNITY): Payer: Self-pay

## 2019-04-27 DIAGNOSIS — I5042 Chronic combined systolic (congestive) and diastolic (congestive) heart failure: Secondary | ICD-10-CM | POA: Insufficient documentation

## 2019-04-27 LAB — BRAIN NATRIURETIC PEPTIDE: B Natriuretic Peptide: 114.7 pg/mL — ABNORMAL HIGH (ref 0.0–100.0)

## 2019-04-27 LAB — BASIC METABOLIC PANEL
Anion gap: 11 (ref 5–15)
BUN: 45 mg/dL — ABNORMAL HIGH (ref 8–23)
CO2: 25 mmol/L (ref 22–32)
Calcium: 9.3 mg/dL (ref 8.9–10.3)
Chloride: 99 mmol/L (ref 98–111)
Creatinine, Ser: 1.56 mg/dL — ABNORMAL HIGH (ref 0.61–1.24)
GFR calc Af Amer: 53 mL/min — ABNORMAL LOW (ref >60)
GFR calc non Af Amer: 46 mL/min — ABNORMAL LOW (ref >60)
Glucose, Bld: 139 mg/dL — ABNORMAL HIGH (ref 70–99)
Potassium: 5.7 mmol/L — ABNORMAL HIGH (ref 3.5–5.1)
Sodium: 135 mmol/L (ref 135–145)

## 2019-04-28 ENCOUNTER — Other Ambulatory Visit: Payer: Self-pay | Admitting: Physician Assistant

## 2019-04-28 NOTE — Progress Notes (Signed)
Advanced [facility] Clinic Note    PCP: Patient, No Pcp Per PCP-Cardiologist: Curtis Nose, MD  HF MD: Curtis [name]   HPI: Curtis [name] is a 69 y/o male with CAD s/p CABG and MVR in 9/20, COPD, chronic systolic HF due to iCM EF 20-25% in 10/20, legally blind.   He was admitted in 9/20 with NSTEMI found to have severe 3v CAD. Underwent CABG x3 (LIMA to LAD, SVG to OM, SVG to diagonal), MR status post MVR on 11/30/2018 with complicated hospital course following this surgery including postoperative cardiogenic shock in setting of EF of 25% requiring inotrope therapy and Impella placement.He also had a left hemothorax evacuation and postoperative atrial fibrillation.  He was admitted to the hospital again on 01/01/2019 with near syncope and was found to be hypotensive with a blood pressure of 84/62. His torsemide was discontinued along with his Entresto and spironolactone. He was to use torsemide as needed for volume overload. Repeat echocardiogram showed an LVEF of 20-25% with a stable bioprosthetic valve.   Today he returns for HF follow up. Over the last few weeks he has been struggling with volume overload. Overall feeling fine. Denies SOB/PND/Orthopnea. Appetite ok. No fever or chills. Weight at home has been trending up 182--->197 pounds. Taking all medications.Followed by [facility]. He has a nurse coming out weekly and caregivers. He is unable to read pill bottles with ongoing vision issues. Medications are set up by his nurse.   Reds Clip 45>38>36% today.   Past Medical History:  Diagnosis Date  . Coronary artery disease   . Elevated troponin 11/25/2018  . Tobacco use     Current Outpatient Medications  Medication Sig Dispense Refill  . aspirin EC 81 MG tablet Take 81 mg by mouth daily.    . carvedilol (COREG) 3.125 MG tablet Take 1 tablet (3.125 mg total) by mouth 2 (two) times daily. 60 tablet 3  . guaiFENesin (MUCINEX) 600 MG 12 hr tablet Take 600 mg by mouth  as needed.    Marland Kitchen losartan (COZAAR) 50 MG tablet Take 50 mg by mouth daily.    . Multiple Vitamin (MULTIVITAMIN WITH MINERALS) TABS tablet Take 1 tablet by mouth daily.    . pantoprazole (PROTONIX) 40 MG tablet Take 40 mg by mouth daily.    . potassium chloride (KLOR-CON) 10 MEQ tablet Take 10 mEq by mouth daily.    . rosuvastatin (CRESTOR) 20 MG tablet Take 20 mg by mouth daily.    Marland Kitchen spironolactone (ALDACTONE) 25 MG tablet Take 1 tablet (25 mg total) by mouth daily. 90 tablet 3  . torsemide (DEMADEX) 20 MG tablet Take 1 tablet (20 mg total) by mouth daily. 30 tablet 3   No current facility-administered medications for this encounter.    Allergies  Allergen Reactions  . Penicillins Rash and Other (See Comments)    Tolerated cefepime 12/2018  Did it involve swelling of the face/tongue/throat, SOB, or low BP? Unknown Did it involve sudden or severe rash/hives, skin peeling, or any reaction on the inside of your mouth or [name]? Yes Did you need to seek medical attention at a hospital or doctor's office? Unknown When did it last happen? childhood If all above answers are "NO", may proceed with cephalosporin use.       Social History   Socioeconomic History  . Marital status: Divorced    Spouse name: Not on file  . Number of children: Not on file  . Years of education: Not on  file  . Highest education level: Not on file  Occupational History  . Not on file  Tobacco Use  . Smoking status: Former Smoker    Packs/day: 0.25    Types: Cigarettes  . Smokeless tobacco: Never Used  Substance and Sexual Activity  . Alcohol use: Never  . Drug use: Never  . Sexual activity: Not Currently  Other Topics Concern  . Not on file  Social History Narrative  . Not on file   Social Determinants of Health   Financial Resource Strain: Low Risk   . Difficulty of Paying Living Expenses: Not hard at all  Food Insecurity: No Food Insecurity  . Worried About Charity fundraiser in the Last  Year: Never true  . Ran Out of Food in the Last Year: Never true  Transportation Needs: No Transportation Needs  . Lack of Transportation (Medical): No  . Lack of Transportation (Non-Medical): No  Physical Activity: Sufficiently Active  . Days of Exercise per Week: 3 days  . Minutes of Exercise per Session: 60 min  Stress: No Stress Concern Present  . Feeling of Stress : Not at all  Social Connections: Somewhat Isolated  . Frequency of Communication with Friends and Family: More than three times a week  . Frequency of Social Gatherings with Friends and Family: Never  . Attends Religious Services: Never  . Active Member of Clubs or Organizations: Yes  . Attends Archivist Meetings: Never  . Marital Status: Divorced  Human resources officer Violence: Not At Risk  . Fear of Current or Ex-Partner: No  . Emotionally Abused: No  . Physically Abused: No  . Sexually Abused: No      Family History  Problem Relation Age of Onset  . Heart attack Father   . Heart attack Mother     Vitals:   04/29/19 1003  BP: 138/72  Pulse: 76  SpO2: 97%  Weight: 91.3 kg (201 lb 3.2 oz)   Wt Readings from Last 3 Encounters:  04/29/19 91.3 kg (201 lb 3.2 oz)  04/15/19 86.4 kg (190 lb 6.4 oz)  04/01/19 82 kg (180 lb 12.8 oz)     PHYSICAL EXAM: General:  Walked in the clinic with a care giver.  No resp difficulty HEENT: normal Neck: supple. no JVD. Carotids 2+ bilat; no bruits. No lymphadenopathy or thryomegaly appreciated. Cor: PMI nondisplaced. Regular rate & rhythm. No rubs, gallops or murmurs. Lungs: clear Abdomen: soft, nontender, nondistended. No hepatosplenomegaly. No bruits or masses. Good bowel sounds. Extremities: no cyanosis, clubbing, rash, edema Neuro: alert & orientedx3, cranial nerves grossly intact. moves all 4 extremities w/o difficulty. Affect pleasant     ASSESSMENT & PLAN:  1. Choronic systolic HF due to iCM - Echo 12/23/2018 EF  20-25% -Plan for CMRI next month.  Will set up with new insurance.  - NYHA II. Reds Clip 36%. Volume status stable despite weight gain. He does not appear volume overloaded on exam.  - Continue torsemide to 20 daily  -- Stop potassium had hyperkalemia last blood check.  - Continue losartan 50 mg tablet daily.  He is adamant he does not want to re challenge entresto.  - Continespironolactone25daily  -Increase carvedilol 6.25 mg twice a day.  - Eventually will need Wilder Glade consider next visit.  - Discussed possible need for ICD +/- CRT.  - Will get cMRI to recheck EF and degree of scarring  2. CAD - Cardiac catheterization 11/24/2018 showed multivessel disease. CABG x3 on 11/30/2018 - Continue  aspirin and statin  - No chest pain.   3.  Postoperative A. Fib Off amiodarone.  4. Essential hypertension -Stable.   5. Cataracts - ok to proceed with cataract extraction  6. COPD - has quit smoking   7. S/p MVR - stable by echo.  - aware of need for SBE prophylaxis.   Follow up in 4 weeks. Ok to stop MVI, potassium , and protonix. Set up CMRI. Check BMET today.  Greater than 50% of the (total minutes 25) visit spent in counseling/coordination of care regarding the above changes.     Tonye Becket, NP 04/29/19

## 2019-04-29 ENCOUNTER — Other Ambulatory Visit: Payer: Self-pay

## 2019-04-29 ENCOUNTER — Encounter (HOSPITAL_COMMUNITY): Payer: Self-pay

## 2019-04-29 ENCOUNTER — Ambulatory Visit (HOSPITAL_COMMUNITY): Payer: Medicare Other

## 2019-04-29 ENCOUNTER — Ambulatory Visit (HOSPITAL_COMMUNITY)
Admission: RE | Admit: 2019-04-29 | Discharge: 2019-04-29 | Disposition: A | Discharge status: Home / Self Care | Payer: Medicare Other | Source: Ambulatory Visit | Attending: Adult Health | Admitting: Adult Health

## 2019-04-29 VITALS — BP 138/72 | HR 76 | Wt 201.2 lb

## 2019-04-29 DIAGNOSIS — Z952 Presence of prosthetic heart valve: Secondary | ICD-10-CM | POA: Insufficient documentation

## 2019-04-29 DIAGNOSIS — I255 Ischemic cardiomyopathy: Secondary | ICD-10-CM | POA: Diagnosis not present

## 2019-04-29 DIAGNOSIS — I480 Paroxysmal atrial fibrillation: Secondary | ICD-10-CM

## 2019-04-29 DIAGNOSIS — H548 Legal blindness, as defined in USA: Secondary | ICD-10-CM | POA: Diagnosis not present

## 2019-04-29 DIAGNOSIS — I5022 Chronic systolic (congestive) heart failure: Secondary | ICD-10-CM | POA: Insufficient documentation

## 2019-04-29 DIAGNOSIS — I25810 Atherosclerosis of coronary artery bypass graft(s) without angina pectoris: Secondary | ICD-10-CM

## 2019-04-29 DIAGNOSIS — I2510 Atherosclerotic heart disease of native coronary artery without angina pectoris: Secondary | ICD-10-CM | POA: Diagnosis not present

## 2019-04-29 DIAGNOSIS — H269 Unspecified cataract: Secondary | ICD-10-CM | POA: Diagnosis not present

## 2019-04-29 DIAGNOSIS — J449 Chronic obstructive pulmonary disease, unspecified: Secondary | ICD-10-CM | POA: Diagnosis not present

## 2019-04-29 DIAGNOSIS — Z79899 Other long term (current) drug therapy: Secondary | ICD-10-CM | POA: Insufficient documentation

## 2019-04-29 DIAGNOSIS — Z7982 Long term (current) use of aspirin: Secondary | ICD-10-CM | POA: Insufficient documentation

## 2019-04-29 DIAGNOSIS — E875 Hyperkalemia: Secondary | ICD-10-CM

## 2019-04-29 DIAGNOSIS — Z8249 Family history of ischemic heart disease and other diseases of the circulatory system: Secondary | ICD-10-CM | POA: Insufficient documentation

## 2019-04-29 DIAGNOSIS — I252 Old myocardial infarction: Secondary | ICD-10-CM | POA: Diagnosis not present

## 2019-04-29 DIAGNOSIS — Z87891 Personal history of nicotine dependence: Secondary | ICD-10-CM | POA: Diagnosis not present

## 2019-04-29 DIAGNOSIS — Z880 Allergy status to penicillin: Secondary | ICD-10-CM | POA: Insufficient documentation

## 2019-04-29 DIAGNOSIS — Z951 Presence of aortocoronary bypass graft: Secondary | ICD-10-CM | POA: Diagnosis not present

## 2019-04-29 DIAGNOSIS — I110 Hypertensive heart disease with heart failure: Secondary | ICD-10-CM | POA: Diagnosis not present

## 2019-04-29 DIAGNOSIS — I5042 Chronic combined systolic (congestive) and diastolic (congestive) heart failure: Secondary | ICD-10-CM | POA: Diagnosis not present

## 2019-04-29 LAB — BASIC METABOLIC PANEL
Anion gap: 10 (ref 5–15)
BUN: 48 mg/dL — ABNORMAL HIGH (ref 8–23)
CO2: 23 mmol/L (ref 22–32)
Calcium: 8.7 mg/dL — ABNORMAL LOW (ref 8.9–10.3)
Chloride: 98 mmol/L (ref 98–111)
Creatinine, Ser: 1.61 mg/dL — ABNORMAL HIGH (ref 0.61–1.24)
GFR calc Af Amer: 51 mL/min — ABNORMAL LOW (ref >60)
GFR calc non Af Amer: 44 mL/min — ABNORMAL LOW (ref >60)
Glucose, Bld: 192 mg/dL — ABNORMAL HIGH (ref 70–99)
Potassium: 4.9 mmol/L (ref 3.5–5.1)
Sodium: 131 mmol/L — ABNORMAL LOW (ref 135–145)

## 2019-04-29 MED ORDER — CARVEDILOL 6.25 MG PO TABS: 6.2500 mg | ORAL_TABLET | Freq: Two times a day (BID) | ORAL | 3 refills | Status: DC

## 2019-04-29 NOTE — Progress Notes (Signed)
ReDS Vest / Clip - 04/29/19 1000      ReDS Vest / Clip   Station Marker  D    Ruler Value  37    ReDS Value Range  Low volume    ReDS Actual Value  36    Anatomical Comments  sitting

## 2019-04-29 NOTE — Patient Instructions (Signed)
STOP Potassium STOP Multi Vitamin STOP Protonix  INCREASE Coreg to 6.25 mg, one tab twice daily  Labs today We will only contact you if something comes back abnormal or we need to make some changes. Otherwise no news is good news!  Your physician recommends that you schedule a follow-up appointment in: 4 weeks with  Dr Gala Romney Do the following things EVERYDAY: 1) Weigh yourself in the morning before breakfast. Write it down and keep it in a log. 2) Take your medicines as prescribed 3) Eat low salt foods--Limit salt (sodium) to 2000 mg per day.  4) Stay as active as you can everyday 5) Limit all fluids for the day to less than 2 liters   At the [facility], you and your health needs are our priority. As part of our continuing mission to provide you with exceptional heart care, we have created designated Provider Care Teams. These Care Teams include your primary Cardiologist (physician) and Advanced Practice Providers (APPs- Physician Assistants and Nurse Practitioners) who all work together to provide you with the care you need, when you need it.   You may see any of the following providers on your designated Care Team at your next follow up: Marland Kitchen Dr Arvilla Meres . Dr Marca Ancona . Tonye Becket, NP . Robbie Lis, PA . Karle Plumber, PharmD   Please be sure to bring in all your medications bottles to every appointment.

## 2019-04-30 ENCOUNTER — Telehealth (HOSPITAL_COMMUNITY): Payer: Self-pay

## 2019-04-30 NOTE — Telephone Encounter (Signed)
-----   Message from Sherald Hess, NP sent at 04/29/2019  3:32 PM EST ----- Renal function elevated. Cut back torsemide to 10 mg one day then 20 mg the next. Potassium was stopped today. Please call Lenn Cal RN [phone] she sets up his medications.

## 2019-04-30 NOTE — Telephone Encounter (Signed)
Called RN. Number not in service. Called pt. Pt verbalized understanding of med changes and states that RN will make changes for him. No further questions this time.

## 2019-05-03 ENCOUNTER — Encounter (HOSPITAL_COMMUNITY): Payer: Self-pay

## 2019-05-03 MED ORDER — TORSEMIDE 20 MG PO TABS: ORAL_TABLET | ORAL | 3 refills | Status: DC

## 2019-05-03 NOTE — Addendum Note (Signed)
Addended by: Noralee Space on: 05/03/2019 04:40 PM   Modules accepted: Orders

## 2019-05-03 NOTE — Telephone Encounter (Signed)
Spoke w/pt's aide, she states med changes were made, med list updated

## 2019-05-25 ENCOUNTER — Encounter (HOSPITAL_COMMUNITY): Payer: Self-pay

## 2019-05-26 ENCOUNTER — Other Ambulatory Visit: Payer: Self-pay

## 2019-05-26 ENCOUNTER — Ambulatory Visit (HOSPITAL_COMMUNITY)
Admission: RE | Admit: 2019-05-26 | Discharge: 2019-05-26 | Disposition: A | Discharge status: Home / Self Care | Payer: HMO | Source: Ambulatory Visit | Attending: Internal Medicine | Admitting: Internal Medicine

## 2019-05-26 ENCOUNTER — Encounter (HOSPITAL_COMMUNITY): Payer: Self-pay

## 2019-05-26 ENCOUNTER — Encounter (HOSPITAL_COMMUNITY): Payer: Self-pay | Admitting: Internal Medicine

## 2019-05-26 VITALS — BP 144/66 | HR 84 | Wt 215.8 lb

## 2019-05-26 DIAGNOSIS — I2510 Atherosclerotic heart disease of native coronary artery without angina pectoris: Secondary | ICD-10-CM | POA: Insufficient documentation

## 2019-05-26 DIAGNOSIS — I5042 Chronic combined systolic (congestive) and diastolic (congestive) heart failure: Secondary | ICD-10-CM | POA: Diagnosis not present

## 2019-05-26 DIAGNOSIS — Z87891 Personal history of nicotine dependence: Secondary | ICD-10-CM | POA: Diagnosis not present

## 2019-05-26 DIAGNOSIS — I252 Old myocardial infarction: Secondary | ICD-10-CM | POA: Insufficient documentation

## 2019-05-26 DIAGNOSIS — Z79899 Other long term (current) drug therapy: Secondary | ICD-10-CM | POA: Diagnosis not present

## 2019-05-26 DIAGNOSIS — J449 Chronic obstructive pulmonary disease, unspecified: Secondary | ICD-10-CM | POA: Diagnosis not present

## 2019-05-26 DIAGNOSIS — Z8249 Family history of ischemic heart disease and other diseases of the circulatory system: Secondary | ICD-10-CM | POA: Diagnosis not present

## 2019-05-26 DIAGNOSIS — H548 Legal blindness, as defined in USA: Secondary | ICD-10-CM | POA: Insufficient documentation

## 2019-05-26 DIAGNOSIS — I4891 Unspecified atrial fibrillation: Secondary | ICD-10-CM | POA: Diagnosis not present

## 2019-05-26 DIAGNOSIS — Z951 Presence of aortocoronary bypass graft: Secondary | ICD-10-CM | POA: Diagnosis not present

## 2019-05-26 DIAGNOSIS — Z952 Presence of prosthetic heart valve: Secondary | ICD-10-CM | POA: Insufficient documentation

## 2019-05-26 DIAGNOSIS — I480 Paroxysmal atrial fibrillation: Secondary | ICD-10-CM | POA: Diagnosis not present

## 2019-05-26 DIAGNOSIS — I5022 Chronic systolic (congestive) heart failure: Secondary | ICD-10-CM | POA: Diagnosis present

## 2019-05-26 DIAGNOSIS — N179 Acute kidney failure, unspecified: Secondary | ICD-10-CM | POA: Insufficient documentation

## 2019-05-26 DIAGNOSIS — I110 Hypertensive heart disease with heart failure: Secondary | ICD-10-CM | POA: Diagnosis not present

## 2019-05-26 DIAGNOSIS — Z7982 Long term (current) use of aspirin: Secondary | ICD-10-CM | POA: Insufficient documentation

## 2019-05-26 LAB — CBC
HCT: 36 % — ABNORMAL LOW (ref 39.0–52.0)
Hemoglobin: 11.4 g/dL — ABNORMAL LOW (ref 13.0–17.0)
MCH: 31.1 pg (ref 26.0–34.0)
MCHC: 31.7 g/dL (ref 30.0–36.0)
MCV: 98.4 fL (ref 80.0–100.0)
Platelets: 267 10*3/uL (ref 150–400)
RBC: 3.66 MIL/uL — ABNORMAL LOW (ref 4.22–5.81)
RDW: 14.9 % (ref 11.5–15.5)
WBC: 10.4 10*3/uL (ref 4.0–10.5)
nRBC: 0 % (ref 0.0–0.2)

## 2019-05-26 LAB — BASIC METABOLIC PANEL
Anion gap: 11 (ref 5–15)
BUN: 14 mg/dL (ref 8–23)
CO2: 29 mmol/L (ref 22–32)
Calcium: 8.7 mg/dL — ABNORMAL LOW (ref 8.9–10.3)
Chloride: 101 mmol/L (ref 98–111)
Creatinine, Ser: 1.12 mg/dL (ref 0.61–1.24)
GFR calc Af Amer: >60 mL/min (ref >60)
GFR calc non Af Amer: >60 mL/min (ref >60)
Glucose, Bld: 173 mg/dL — ABNORMAL HIGH (ref 70–99)
Potassium: 3.6 mmol/L (ref 3.5–5.1)
Sodium: 141 mmol/L (ref 135–145)

## 2019-05-26 LAB — BRAIN NATRIURETIC PEPTIDE: B Natriuretic Peptide: 182.7 pg/mL — ABNORMAL HIGH (ref 0.0–100.0)

## 2019-05-26 MED ORDER — TORSEMIDE 20 MG PO TABS: 20.0000 mg | ORAL_TABLET | Freq: Every day | ORAL | 3 refills | Status: DC

## 2019-05-26 MED ORDER — LOSARTAN POTASSIUM 100 MG PO TABS: 100.0000 mg | ORAL_TABLET | Freq: Every day | ORAL | 6 refills | Status: AC

## 2019-05-26 NOTE — Patient Instructions (Addendum)
Increase Torsemide to 20 mg daily  Increase Losartan to 100 mg daily  Lab work done today was all stable  Labs needed again in 2 weeks  Please wear your compression hose daily, place them on as soon as you get up in the morning and remove before you go to bed at night.  Your physician recommends that you schedule a follow-up appointment in: 3 months  If you have any questions or concerns before your next appointment please send [place] a message through [place] or call our office at [phone].  At the [facility], you and your health needs are our priority. As part of our continuing mission to provide you with exceptional heart care, we have created designated Provider Care Teams. These Care Teams include your primary Cardiologist (physician) and Advanced Practice Providers (APPs- Physician Assistants and Nurse Practitioners) who all work together to provide you with the care you need, when you need it.   You may see any of the following providers on your designated Care Team at your next follow up: Marland Kitchen Dr Arvilla Meres . Dr Marca Ancona . Tonye Becket, NP . Robbie Lis, PA . Karle Plumber, PharmD   Please be sure to bring in all your medications bottles to every appointment.

## 2019-05-26 NOTE — Addendum Note (Signed)
Encounter addended by: Noralee Space, RN on: 05/26/2019 3:43 PM  Actions taken: Order Reconciliation Section accessed

## 2019-05-26 NOTE — Progress Notes (Signed)
Advanced Heart Failure Clinic Note    PCP: Tally Joe, MD PCP-Cardiologist: Chrystie Nose, MD  HF MD: Dr Gala Romney   HPI: Mr. Rinks is a 69 y/o male with CAD s/p CABG and MVR in 9/20, COPD, chronic systolic HF due to iCM EF 20-25% in 10/20, legally blind.   He was admitted in 9/20 with NSTEMI found to have severe 3v CAD. Underwent CABG x3 (LIMA to LAD, SVG to OM, SVG to diagonal), MR status post MVR on 11/30/2018 with complicated hospital course following this surgery including postoperative cardiogenic shock in setting of EF of 25% requiring inotrope therapy and Impella placement.He also had a left hemothorax evacuation and postoperative atrial fibrillation.  He was admitted to the hospital again on 01/01/2019 with near syncope and was found to be hypotensive with a blood pressure of 84/62. His torsemide was discontinued along with his Entresto and spironolactone. He was to use torsemide as needed for volume overload. Repeat echocardiogram showed an LVEF of 20-25% with a stable bioprosthetic valve.   Today he returns for HF follow up. He is here with his  [facility] Navigator.  Says he feels fine. Denies SOB, orthopnea, PND or edema. Weight up over last few months 182 -> 197 -> 212. Taking all medications. He is unable to read pill bottles with ongoing vision issues. Medications are set up by his nurse. He drinks 2 glasses of water per day and 3 sodas. BP 118/58 -> 147/70. Taking torsemide 20 daily alternating with 10 daily.   Reds Clip 45>38>36%> 46% today  Past Medical History:  Diagnosis Date  . Coronary artery disease   . Elevated troponin 11/25/2018  . Tobacco use     Current Outpatient Medications  Medication Sig Dispense Refill  . aspirin EC 81 MG tablet Take 81 mg by mouth daily.    . carvedilol (COREG) 6.25 MG tablet Take 1 tablet (6.25 mg total) by mouth 2 (two) times daily. 60 tablet 3  . guaiFENesin (MUCINEX) 600 MG 12 hr tablet Take 600 mg by mouth as needed.     Marland Kitchen losartan (COZAAR) 50 MG tablet Take 50 mg by mouth daily.    . rosuvastatin (CRESTOR) 20 MG tablet Take 20 mg by mouth daily.    Marland Kitchen spironolactone (ALDACTONE) 25 MG tablet Take 1 tablet (25 mg total) by mouth daily. 90 tablet 3  . torsemide (DEMADEX) 20 MG tablet Take 1 tab every other day ALTERNATING with 0.5 tab every other day 30 tablet 3   No current facility-administered medications for this encounter.    Allergies  Allergen Reactions  . Penicillins Rash and Other (See Comments)    Tolerated cefepime 12/2018  Did it involve swelling of the face/tongue/throat, SOB, or low BP? Unknown Did it involve sudden or severe rash/hives, skin peeling, or any reaction on the inside of your mouth or nose? Yes Did you need to seek medical attention at a hospital or doctor's office? Unknown When did it last happen? childhood If all above answers are "NO", may proceed with cephalosporin use.       Social History   Socioeconomic History  . Marital status: Divorced    Spouse name: Not on file  . Number of children: Not on file  . Years of education: Not on file  . Highest education level: Not on file  Occupational History  . Not on file  Tobacco Use  . Smoking status: Former Smoker    Packs/day: 0.25    Types: Cigarettes  .  Smokeless tobacco: Never Used  Substance and Sexual Activity  . Alcohol use: Never  . Drug use: Never  . Sexual activity: Not Currently  Other Topics Concern  . Not on file  Social History Narrative  . Not on file   Social Determinants of Health   Financial Resource Strain: Low Risk   . Difficulty of Paying Living Expenses: Not hard at all  Food Insecurity: No Food Insecurity  . Worried About Charity fundraiser in the Last Year: Never true  . Ran Out of Food in the Last Year: Never true  Transportation Needs: No Transportation Needs  . Lack of Transportation (Medical): No  . Lack of Transportation (Non-Medical): No  Physical Activity:  Sufficiently Active  . Days of Exercise per Week: 3 days  . Minutes of Exercise per Session: 60 min  Stress: No Stress Concern Present  . Feeling of Stress : Not at all  Social Connections: Somewhat Isolated  . Frequency of Communication with Friends and Family: More than three times a week  . Frequency of Social Gatherings with Friends and Family: Never  . Attends Religious Services: Never  . Active Member of Clubs or Organizations: Yes  . Attends Archivist Meetings: Never  . Marital Status: Divorced  Human resources officer Violence: Not At Risk  . Fear of Current or Ex-Partner: No  . Emotionally Abused: No  . Physically Abused: No  . Sexually Abused: No      Family History  Problem Relation Age of Onset  . Heart attack Father   . Heart attack Mother     Vitals:   05/26/19 1340  BP: (!) 144/66  Pulse: 84  SpO2: 96%  Weight: 97.9 kg (215 lb 12.8 oz)   Wt Readings from Last 3 Encounters:  05/26/19 97.9 kg (215 lb 12.8 oz)  04/29/19 91.3 kg (201 lb 3.2 oz)  04/15/19 86.4 kg (190 lb 6.4 oz)     PHYSICAL EXAM: General:  Walked in the clinic with a care giver.  No resp difficulty HEENT: normal Neck: supple. no JVD. Carotids 2+ bilat; no bruits. No lymphadenopathy or thryomegaly appreciated. Cor: PMI nondisplaced. Regular rate & rhythm. No rubs, gallops or murmurs. Lungs: clear Abdomen: obese soft, nontender, nondistended. No hepatosplenomegaly. No bruits or masses. Good bowel sounds. Extremities: no cyanosis, clubbing, rash, 2-3+  L>R edema with chronic venous stasis changes Neuro: alert & orientedx3, cranial nerves grossly intact. moves all 4 extremities w/o difficulty. Affect pleasant   ASSESSMENT & PLAN:  1. Choronic systolic HF due to iCM - Echo 12/23/2018 EF  20-25% - Plan for CMRI on 06/08/19  - NYHA II. Reds Clip 46%.Volume status is up - Increase torsemide back to 20 daily  - Increase potassium to 40 daily  - Increase losartan to 100 mg tablet daily.   - Limit fluid intake  - Continespironolactone25daily  - Continue carvedilol 6.25 mg twice a day.  - Eventually will need Wilder Glade consider - Add compression hose - Discussed possible need for ICD +/- CRT. Await cMRI results - see back in 2 weeks   2. CAD - Cardiac catheterization 11/24/2018 showed multivessel disease. CABG x3 on 11/30/2018 - No s/s angina - Continue aspirin and statin  - refuses CR  3.  Postoperative A. Fib - Now in NSR. Off amiodarone.  4. Essential hypertension -Stable.   5. Cataracts - ok to proceed with cataract extraction  6. COPD - has quit smoking   7. S/p MVR - stable by  echo.  - aware of need for SBE prophylaxis.   8. AKI - resolved  Arvilla Meres, MD 05/26/19

## 2019-05-27 ENCOUNTER — Other Ambulatory Visit (HOSPITAL_COMMUNITY): Payer: Self-pay | Admitting: *Deleted

## 2019-05-27 MED ORDER — POTASSIUM CHLORIDE CRYS ER 20 MEQ PO TBCR: 40.0000 meq | EXTENDED_RELEASE_TABLET | Freq: Every day | ORAL | 3 refills | Status: DC

## 2019-05-27 NOTE — Progress Notes (Signed)
kdur  

## 2019-06-01 ENCOUNTER — Telehealth: Payer: Self-pay | Admitting: Internal Medicine

## 2019-06-01 NOTE — Telephone Encounter (Signed)
   Curtis [name] from [employer], checking if we received a request to verify pt diagnosis. provide correct fax where she can send it to

## 2019-06-04 NOTE — Progress Notes (Addendum)
PCP: Antony Contras, MD PCP-Cardiologist: Pixie Casino, MD  HF MD: Dr Haroldine Laws   HPI:  Curtis [name] is a 69 y/o male with CAD s/p CABG and MVR in 9/20, COPD, chronic systolic HF due to iCM EF 20-25% in 10/20, legally blind.    He was admitted in 9/20 with NSTEMI found to have severe 3v CAD. Underwent CABG x3 (LIMA to LAD, SVG to OM, SVG to diagonal), MR status post MVR on 2/37/6283 with complicated hospital course following this surgery including postoperative cardiogenic shock in setting of EF of 25% requiring inotrope therapy and Impella placement. He also had a left hemothorax evacuation and postoperative atrial fibrillation.   He was admitted to the hospital again on 01/01/2019 with near syncope and was found to be hypotensive with a blood pressure of 84/62.  His torsemide was discontinued along with his Entresto and spironolactone.  He was to use torsemide as needed for volume overload.  Repeat echocardiogram showed an LVEF of 20-25% with a stable bioprosthetic valve.    Recently returned to HF Clinic for follow up with Dr. Haroldine Laws on 05/26/19. He was here with his [facility] Care Navigator.  Stated he felt fine. Denied SOB, orthopnea, PND or edema. Weight up over last few months 182 -> 197 -> 212. Taking all medications. He is unable to read pill bottles with ongoing vision issues. Medications are set up by his nurse. He drinks 2 glasses of water per day and 3 sodas. BP 118/58 -> 147/70. Taking torsemide 20 daily alternating with 10 daily.   Today he returns to HF clinic for pharmacist medication titration. At last visit with MD, torsemide was increased back to 20 mg daily, potassium was increased to 40 mEq daily and losartan was increased to 100 mg daily. Overall he is feeling well today. He is excited to report he will have cataracts surgery next week. No dizziness, lightheadedness, chest pain or palpitations. He was able to walk around [employer] last week without stopping. His caregiver weighs him  daily at home and his weight has been stable at 212-214 lbs. He is 214.4 lbs in clinic today, down 1 lb from last visit. He is taking torsemide 20 mg daily. ReDs was 41% in clinic today, down from 46% last time. Has 1+ bilateral LEE on exam. He was told to start wearing compression stockings. He states he wears these 2-3 times per week, but his caregiver stated that he does not. He is also not following a low sodium diet. No PND/orthopnea. Taking all medications as prescribed. He now has prescription insurance with [employer].  HF Medications: Carvedilol 6.25 mg BID Losartan 100 mg daily Spironolactone 25 mg daily Torsemide 20 mg daily Potassium chloride 40 mEq daily  Has the patient been experiencing any side effects to the medications prescribed?  no  Does the patient have any problems obtaining medications due to transportation or finances?   No - recently obtained [employer] plan for prescription coverage.   Understanding of regimen: fair Understanding of indications: fair Potential of compliance: good Patient understands to avoid NSAIDs. Patient understands to avoid decongestants.    Pertinent Lab Values: Serum creatinine 1.41, BUN 25, Potassium 4.9, Sodium 136, BNP 88.7  Vital Signs: Weight: 214.4 lbs (last clinic weight: 215.8 lbs) Blood pressure: 132/77  Heart rate: 80   Assessment:   1.  Choronic systolic HF due to iCM - Echo 12/23/2018 EF  20-25% - Plan for CMRI on 06/08/19   - NYHA II. Mildly volume  elevated on exam. Reds Clip 41% (improved from 46% last visit).  Needs to limit fluid intake. Encouraged use of compression hose. - Vitals: BP 132/77, HR 80 - Labs: Scr 1.41, K 4.9 - Continue torsemide 20 mg daily  - Continue carvedilol 6.25 mg twice a day. - Continue losartan 100 mg tablet daily.  - Contine spironolactone 25 daily   - Start empagliflozin 10 mg daily. Repeat BMET in 3 weeks.   2. CAD - Cardiac catheterization 11/24/2018 showed  multivessel disease.  CABG x 3 on 11/30/2018 - No s/s angina - Continue aspirin and statin  - refuses CR   3.  Postoperative A. Fib - Now in NSR. Off amiodarone.   4. Essential hypertension -Stable.    5. Cataracts - ok to proceed with cataract extraction   6. COPD - has quit smoking    7. S/p MVR - stable by echo.  - aware of need for SBE prophylaxis.      Plan: 1) Medication changes: Based on clinical presentation, vital signs and recent labs will start empagliflozin 10 mg daily.  2) Follow-up: 3 weeks with Dr. Elyse Jarvis, PharmD, BCPS, BCCP, CPP Heart Failure Clinic Pharmacist [phone]  Agree with plan as above. He continues to disregard advice to limit fluid intake.   Arvilla Meres, MD  7:27 PM

## 2019-06-08 ENCOUNTER — Ambulatory Visit (HOSPITAL_COMMUNITY): Payer: HMO

## 2019-06-08 ENCOUNTER — Encounter (HOSPITAL_COMMUNITY): Payer: Self-pay

## 2019-06-09 ENCOUNTER — Other Ambulatory Visit (HOSPITAL_COMMUNITY): Payer: Self-pay | Admitting: *Deleted

## 2019-06-09 ENCOUNTER — Telehealth: Payer: Self-pay | Admitting: Internal Medicine

## 2019-06-09 MED ORDER — TORSEMIDE 20 MG PO TABS: 20.0000 mg | ORAL_TABLET | Freq: Every day | ORAL | 3 refills | Status: DC

## 2019-06-09 NOTE — Telephone Encounter (Signed)
New message   Curtis [name] is checking to see if paper work was received from [employer]. It was paperwork stating that the patient had CHF. Please call to discuss.

## 2019-06-10 NOTE — Telephone Encounter (Signed)
Paperwork received via mail Faxed to [phone]

## 2019-06-10 NOTE — Telephone Encounter (Signed)
Routed to CHF clinic triage, as this patient is being followed there and no fax received at [facility] location

## 2019-06-15 ENCOUNTER — Other Ambulatory Visit: Payer: Self-pay

## 2019-06-15 ENCOUNTER — Ambulatory Visit (HOSPITAL_COMMUNITY)
Admission: RE | Admit: 2019-06-15 | Discharge: 2019-06-15 | Disposition: A | Discharge status: Home / Self Care | Payer: HMO | Source: Ambulatory Visit | Attending: Cardiology | Admitting: Cardiology

## 2019-06-15 VITALS — BP 132/77 | HR 80 | Wt 214.4 lb

## 2019-06-15 DIAGNOSIS — H269 Unspecified cataract: Secondary | ICD-10-CM | POA: Diagnosis not present

## 2019-06-15 DIAGNOSIS — I9789 Other postprocedural complications and disorders of the circulatory system, not elsewhere classified: Secondary | ICD-10-CM | POA: Diagnosis not present

## 2019-06-15 DIAGNOSIS — I5022 Chronic systolic (congestive) heart failure: Secondary | ICD-10-CM | POA: Insufficient documentation

## 2019-06-15 DIAGNOSIS — I252 Old myocardial infarction: Secondary | ICD-10-CM | POA: Insufficient documentation

## 2019-06-15 DIAGNOSIS — Z79899 Other long term (current) drug therapy: Secondary | ICD-10-CM | POA: Insufficient documentation

## 2019-06-15 DIAGNOSIS — I2510 Atherosclerotic heart disease of native coronary artery without angina pectoris: Secondary | ICD-10-CM | POA: Insufficient documentation

## 2019-06-15 DIAGNOSIS — I4891 Unspecified atrial fibrillation: Secondary | ICD-10-CM | POA: Diagnosis not present

## 2019-06-15 DIAGNOSIS — I255 Ischemic cardiomyopathy: Secondary | ICD-10-CM | POA: Insufficient documentation

## 2019-06-15 DIAGNOSIS — I5042 Chronic combined systolic (congestive) and diastolic (congestive) heart failure: Secondary | ICD-10-CM | POA: Diagnosis present

## 2019-06-15 DIAGNOSIS — I110 Hypertensive heart disease with heart failure: Secondary | ICD-10-CM | POA: Insufficient documentation

## 2019-06-15 DIAGNOSIS — J449 Chronic obstructive pulmonary disease, unspecified: Secondary | ICD-10-CM | POA: Diagnosis not present

## 2019-06-15 DIAGNOSIS — Z953 Presence of xenogenic heart valve: Secondary | ICD-10-CM | POA: Diagnosis not present

## 2019-06-15 DIAGNOSIS — Z7982 Long term (current) use of aspirin: Secondary | ICD-10-CM | POA: Insufficient documentation

## 2019-06-15 DIAGNOSIS — Z87891 Personal history of nicotine dependence: Secondary | ICD-10-CM | POA: Insufficient documentation

## 2019-06-15 DIAGNOSIS — Z951 Presence of aortocoronary bypass graft: Secondary | ICD-10-CM | POA: Diagnosis not present

## 2019-06-15 DIAGNOSIS — H548 Legal blindness, as defined in USA: Secondary | ICD-10-CM | POA: Diagnosis not present

## 2019-06-15 LAB — BASIC METABOLIC PANEL
Anion gap: 9 (ref 5–15)
BUN: 25 mg/dL — ABNORMAL HIGH (ref 8–23)
CO2: 26 mmol/L (ref 22–32)
Calcium: 8.7 mg/dL — ABNORMAL LOW (ref 8.9–10.3)
Chloride: 101 mmol/L (ref 98–111)
Creatinine, Ser: 1.41 mg/dL — ABNORMAL HIGH (ref 0.61–1.24)
GFR calc Af Amer: 60 mL/min — ABNORMAL LOW (ref >60)
GFR calc non Af Amer: 52 mL/min — ABNORMAL LOW (ref >60)
Glucose, Bld: 187 mg/dL — ABNORMAL HIGH (ref 70–99)
Potassium: 4.9 mmol/L (ref 3.5–5.1)
Sodium: 136 mmol/L (ref 135–145)

## 2019-06-15 LAB — BRAIN NATRIURETIC PEPTIDE: B Natriuretic Peptide: 88.7 pg/mL (ref 0.0–100.0)

## 2019-06-15 MED ORDER — EMPAGLIFLOZIN 10 MG PO TABS: 10.0000 mg | ORAL_TABLET | Freq: Every day | ORAL | 11 refills | Status: AC

## 2019-06-15 MED ORDER — TORSEMIDE 20 MG PO TABS: 20.0000 mg | ORAL_TABLET | Freq: Every day | ORAL | 3 refills | Status: DC

## 2019-06-15 NOTE — Patient Instructions (Signed)
It was a pleasure seeing you today!  MEDICATIONS: -We are changing your medications today  - Start Jardiance 10 mg (1 tablet) daily. -Call if you have questions about your medications.  LABS: -We will call you if your labs need attention.  NEXT APPOINTMENT: Return to clinic in 3 weeks with Dr. Gala Romney.  In general, to take care of your heart failure: -Limit your fluid intake to 2 Liters (half-gallon) per day.   -Limit your salt intake to ideally 2-3 grams (2000-3000 mg) per day. -Weigh yourself daily and record, and bring that "weight diary" to your next appointment.  (Weight gain of 2-3 pounds in 1 day typically means fluid weight.) -The medications for your heart are to help your heart and help you live longer.   -Please contact us before stopping any of your heart medications.  Call the clinic at [phone] with questions or to reschedule future appointments.

## 2019-06-21 ENCOUNTER — Ambulatory Visit: Payer: HMO | Admitting: Internal Medicine

## 2019-06-21 ENCOUNTER — Encounter: Payer: Self-pay | Admitting: Internal Medicine

## 2019-06-21 ENCOUNTER — Other Ambulatory Visit: Payer: Self-pay

## 2019-06-21 VITALS — BP 124/66 | HR 75 | Temp 97.1°F | Ht 69.0 in | Wt 213.8 lb

## 2019-06-21 DIAGNOSIS — Z952 Presence of prosthetic heart valve: Secondary | ICD-10-CM

## 2019-06-21 DIAGNOSIS — I10 Essential (primary) hypertension: Secondary | ICD-10-CM | POA: Diagnosis not present

## 2019-06-21 DIAGNOSIS — I5042 Chronic combined systolic (congestive) and diastolic (congestive) heart failure: Secondary | ICD-10-CM

## 2019-06-21 DIAGNOSIS — E785 Hyperlipidemia, unspecified: Secondary | ICD-10-CM

## 2019-06-21 DIAGNOSIS — Z951 Presence of aortocoronary bypass graft: Secondary | ICD-10-CM | POA: Diagnosis not present

## 2019-06-21 NOTE — Patient Instructions (Signed)
Medication Instructions:  Your physician recommends that you continue on your current medications as directed. Please refer to the Current Medication list given to you today.  *If you need a refill on your cardiac medications before your next appointment, please call your pharmacy*   Follow-Up: At [facility], you and your health needs are our priority.  As part of our continuing mission to provide you with exceptional heart care, we have created designated Provider Care Teams.  These Care Teams include your primary Cardiologist (physician) and Advanced Practice Providers (APPs -  Physician Assistants and Nurse Practitioners) who all work together to provide you with the care you need, when you need it.  We recommend signing up for the patient portal called "MyChart".  Sign up information is provided on this After Visit Summary.  MyChart is used to connect with patients for Virtual Visits (Telemedicine).  Patients are able to view lab/test results, encounter notes, upcoming appointments, etc.  Non-urgent messages can be sent to your provider as well.   To learn more about what you can do with MyChart, go to [email or web address].    Your next appointment:   12 month(s)  The format for your next appointment:   In Person  Provider:   You may see Chrystie Nose, MD or one of the following Advanced Practice Providers on your designated Care Team:    Azalee Course, PA-C  Micah Flesher, PA-C or   Judy Pimple, PA-C  Edd Fabian, NP  Joni Reining, NP    Other Instructions

## 2019-06-22 ENCOUNTER — Encounter: Payer: Self-pay | Admitting: Internal Medicine

## 2019-06-22 NOTE — Progress Notes (Signed)
OFFICE NOTE  Chief Complaint:  Follow-up  Primary Care Physician: Antony Contras, MD  HPI:  Curtis [name] is a 69 y.o. male with a past medial history significant for tobacco abuse, prediabetes, CAD s/p CABG, and mitral valve repair.  Patient was initially seen on 11/23/2018 for shortness of breath.  He was hypoxic on presentation and required supplemental oxygen.  On arrival, he was also in narrow complex tachycardia with heart rate of 140 and was given adenosine.  He subsequently slows down to a sinus tachycardia with left bundle branch block.  Initial troponin was 63, subsequent troponin trended up to 4477 then 9016.  Given new left bundle branch block, there was high suspicion of ACS.  Catheterization performed on 11/24/2018 showed 30 to 30-40% left main disease, 50% proximal LAD followed by 60 to 70% proximal LAD lesion, severe disease in proximal D1, 85% mid to distal left circumflex lesion, 99% disease in his distal left circumflex, totally occluded codominant RCA with right to right and left-to-right collaterals, EF 25 to 30%, LVEDP 16 mmHg.  Echocardiogram also confirms EF of 20 to 25% with concern of moderate to severe MR.  Subsequent TEE demonstrated at least moderate mitral regurgitation.  Patient eventually underwent CABG x3 with LIMA to distal LAD, SVG to OM, SVG to diagonal and bioprosthetic MVR by Dr. Orvan Seen on 11/30/2018.  Postop course was complicated by cardiogenic shock in the setting of severe ischemic cardiomyopathy and postop bleeding and hemorrhagic shock.  Patient was placed on Impella for several days along with milrinone and pressure.  Impella was removed on 10/2 with left hemothorax evacuation.  Postop, he was placed on Entresto and spironolactone for afterload reduction.  He was also placed on amiodarone for transient postop atrial fibrillation.  He was discharged from [place] on 10/23.    06/21/2019  Curtis [name] is seen today in follow-up.  I initially saw him in consultation on  November 24, 2018.  Since then he has been seen by multiple partners as well as our NP/PAs and more recently has been followed in the heart failure clinic by Dr. Ronna Polio.  In fact he was just recently seen and has a follow-up appointment on May 3.  He does have persistent cardiomyopathy with plans for an upcoming cardiac MRI to determine if he needs an AICD.  More recently Dr. Ronna Polio increased his torsemide and he has noticed about a 2 pound weight loss.  He denies any worsening shortness of breath.  He has no signs or symptoms of heart failure today, however this was apparently not present on exam in the past and was only noted to have increased values in impedance testing.  PMHx:  Past Medical History:  Diagnosis Date  . Coronary artery disease   . Elevated troponin 11/25/2018  . Tobacco use     Past Surgical History:  Procedure Laterality Date  . CORONARY ARTERY BYPASS GRAFT N/A 11/30/2018   Procedure: MEDIASTINAL POST-OP BLEED;  Surgeon: Wonda Olds, MD;  Location: [place];  Service: Open Heart Surgery;  Laterality: N/A;  . CORONARY ARTERY BYPASS GRAFT N/A 11/30/2018   Procedure: CORONARY ARTERY BYPASS GRAFTING (CABG) x Three , using left internal mammary artery and left leg greater saphenous vein harvested endoscopically;  Surgeon: Wonda Olds, MD;  Location: [place];  Service: Open Heart Surgery;  Laterality: N/A;  . LEFT HEART CATH AND CORONARY ANGIOGRAPHY N/A 11/24/2018   Procedure: LEFT HEART CATH AND CORONARY ANGIOGRAPHY;  Surgeon: Belva Crome,  MD;  Location: [facility] INVASIVE CV LAB;  Service: Cardiovascular;  Laterality: N/A;  . MITRAL VALVE REPAIR N/A 11/30/2018   Procedure: MITRAL VALVE REPLACEMENT (MVR) USING MAGNA EASE SIZE 105mm;  Surgeon: Linden Dolin, MD;  Location[facility] OR;  Service: Open Heart Surgery;  Laterality: N/A;  . PLACEMENT OF IMPELLA LEFT VENTRICULAR ASSIST DEVICE N/A 11/30/2018   Procedure: PLACEMENT OF IMPELLA LEFT VENTRICULAR ASSIST DEVICE;  Surgeon:  Linden Dolin, MD;  Location: [facility] OR;  Service: Open Heart Surgery;  Laterality: N/A;  . REMOVAL OF IMPELLA LEFT VENTRICULAR ASSIST DEVICE N/A 12/04/2018   Procedure: REMOVAL OF IMPELLA LEFT VENTRICULAR ASSIST DEVICE.  EVACUATION OF LEFT HEMOTHORAX;  Surgeon: Linden Dolin, MD;  Location: [facility] OR;  Service: Open Heart Surgery;  Laterality: N/A;  . STERNAL INCISION RECLOSURE  12/04/2018   Procedure: Sternal Plating with Sternal Rewiring;  Surgeon: Linden Dolin, MD;  Location: [facility] OR;  Service: Open Heart Surgery;;  . TEE WITHOUT CARDIOVERSION N/A 11/26/2018   Procedure: TRANSESOPHAGEAL ECHOCARDIOGRAM (TEE);  Surgeon: Jake Bathe, MD;  Location[facility] ENDOSCOPY;  Service: Cardiovascular;  Laterality: N/A;  . TEE WITHOUT CARDIOVERSION N/A 11/30/2018   Procedure: TRANSESOPHAGEAL ECHOCARDIOGRAM (TEE);  Surgeon: Linden Dolin, MD;  Location: [facility] OR;  Service: Open Heart Surgery;  Laterality: N/A;  . TEE WITHOUT CARDIOVERSION N/A 12/04/2018   Procedure: TRANSESOPHAGEAL ECHOCARDIOGRAM (TEE);  Surgeon: Linden Dolin, MD;  Location: [facility] OR;  Service: Open Heart Surgery;  Laterality: N/A;    FAMHx:  Family History  Problem Relation Age of Onset  . Heart attack Father   . Heart attack Mother     SOCHx:   reports that he has quit smoking. His smoking use included cigarettes. He smoked 0.25 packs per day. He has never used smokeless tobacco. He reports that he does not drink alcohol or use drugs.  ALLERGIES:  Allergies  Allergen Reactions  . Penicillins Rash and Other (See Comments)    Tolerated cefepime 12/2018  Did it involve swelling of the face/tongue/throat, SOB, or low BP? Unknown Did it involve sudden or severe rash/hives, skin peeling, or any reaction on the inside of your mouth or nose? Yes Did you need to seek medical attention at a hospital or doctor's office? Unknown When did it last happen? childhood If all above answers are "NO", may proceed with cephalosporin use.      ROS: Pertinent items noted in HPI and remainder of comprehensive ROS otherwise negative.  HOME MEDS: Current Outpatient Medications on File Prior to Visit  Medication Sig Dispense Refill  . aspirin EC 81 MG tablet Take 81 mg by mouth daily.    . carvedilol (COREG) 6.25 MG tablet Take 1 tablet (6.25 mg total) by mouth 2 (two) times daily. 60 tablet 3  . empagliflozin (JARDIANCE) 10 MG TABS tablet Take 10 mg by mouth daily before breakfast. 30 tablet 11  . guaiFENesin (MUCINEX) 600 MG 12 hr tablet Take 600 mg by mouth as needed.    Marland Kitchen losartan (COZAAR) 100 MG tablet Take 1 tablet (100 mg total) by mouth daily. 30 tablet 6  . potassium chloride SA (KLOR-CON) 20 MEQ tablet Take 2 tablets (40 mEq total) by mouth daily. 60 tablet 3  . rosuvastatin (CRESTOR) 20 MG tablet Take 20 mg by mouth daily.    Marland Kitchen spironolactone (ALDACTONE) 25 MG tablet Take 1 tablet (25 mg total) by mouth daily. 90 tablet 3  . torsemide (DEMADEX) 20 MG tablet Take 1 tablet (20 mg  total) by mouth daily. 30 tablet 3   No current facility-administered medications on file prior to visit.    LABS/IMAGING: No results found for this or any previous visit (from the past 48 hour(s)). No results found.  LIPID PANEL:    Component Value Date/Time   CHOL 117 01/27/2019 1016   TRIG 135 01/27/2019 1016   HDL 44 01/27/2019 1016   CHOLHDL 2.7 01/27/2019 1016   CHOLHDL 7.5 11/25/2018 0834   VLDL 31 11/25/2018 0834   LDLCALC 49 01/27/2019 1016     WEIGHTS: Wt Readings from Last 3 Encounters:  06/21/19 213 lb 12.8 oz (97 kg)  06/15/19 214 lb 6.4 oz (97.3 kg)  05/26/19 215 lb 12.8 oz (97.9 kg)    VITALS: BP 124/66   Pulse 75   Temp (!) 97.1 F (36.2 C)   Ht 5\' 9"  (1.753 m)   Wt 213 lb 12.8 oz (97 kg)   SpO2 96%   BMI 31.57 kg/m   EXAM: General appearance: alert and no distress Neck: no carotid bruit, no JVD and thyroid not enlarged, symmetric, no tenderness/mass/nodules Lungs: clear to auscultation bilaterally  Heart: regular rate and rhythm Abdomen: soft, non-tender; bowel sounds normal; no masses,  no organomegaly Extremities: extremities normal, atraumatic, no cyanosis or edema Pulses: 2+ and symmetric Skin: Skin color, texture, turgor normal. No rashes or lesions Neurologic: Mental status: Alert, oriented, thought content appropriate, Legally blind Psych: Pleasant  EKG: Normal sinus rhythm at 75, nonspecific IVCD, inferior T wave changes-personally reviewed- personally reviewed  ASSESSMENT: 1. Ischemic cardiomyopathy-LVEF 20 to 25% 2. Chronic systolic congestive heart failure, NYHA class II symptoms 3. Coronary artery disease status post CABG x3 (11/30/2018) and bioprosthetic mitral valve replacement 4. Hyperlipidemia 5. Hypertension 6. Legal blindness with bilateral cataracts  PLAN: 1.   Curtis [name] is being closely followed in the congestive heart failure clinic.  Recent adjustment his medications have been well-tolerated and he does appear to be euvolemic to me on exam today.  He is supposed to follow-up with Dr. Lennox Grumbles in early May after a cardiac MRI however the MRI had to be rescheduled.  I encouraged him to contact radiology to see if they could schedule it earlier prior to that appointment so that it could be reviewed with Dr. June.  No changes to his medicines today.  Gala Romney, MD, [facility], FACP  [place]  [facility]  Medical Director of the Advanced Lipid Disorders &  Cardiovascular Risk Reduction Clinic Diplomate of the American Board of Clinical Lipidology Attending Cardiologist  Direct Dial: [phone]  Fax: [phone]  Website:  [email or web address] Sophie Tamez 06/22/2019, 11:29 PM

## 2019-06-24 ENCOUNTER — Ambulatory Visit (HOSPITAL_COMMUNITY)
Admission: RE | Admit: 2019-06-24 | Discharge: 2019-06-24 | Disposition: A | Discharge status: Home / Self Care | Payer: HMO | Source: Ambulatory Visit | Attending: Internal Medicine | Admitting: Internal Medicine

## 2019-06-24 ENCOUNTER — Other Ambulatory Visit: Payer: Self-pay

## 2019-06-24 DIAGNOSIS — I5042 Chronic combined systolic (congestive) and diastolic (congestive) heart failure: Secondary | ICD-10-CM

## 2019-06-24 MED ORDER — GADOBUTROL 1 MMOL/ML IV SOLN
10.0000 mL | Freq: Once | INTRAVENOUS | Status: AC | PRN
  Administered 2019-06-24: 10 mL via INTRAVENOUS

## 2019-06-29 ENCOUNTER — Other Ambulatory Visit: Payer: Self-pay

## 2019-06-29 NOTE — Telephone Encounter (Signed)
This encounter was created in error - please disregard.

## 2019-06-29 NOTE — Patient Outreach (Signed)
  [facility]) Care Management Chronic Special Needs Program    06/29/2019  Name: Curtis [name], DOB: [birth year]  MRN: [id number]   Mr. [place] Homewood is enrolled in a chronic special needs plan for Heart Failure. Telephone call to client to review health risk assessment and complete initial assessment. Unable to reach. HIPAA compliant voice message left with call back phone number.   PLAN; RNCM will attempt 2nd telephone call within 1 week.   George Ina RN,BSN,CCM Chronic Care Management Coordinator [facility] Care Management [phone]

## 2019-07-05 ENCOUNTER — Encounter (HOSPITAL_COMMUNITY): Payer: Self-pay | Admitting: Internal Medicine

## 2019-07-05 ENCOUNTER — Telehealth (HOSPITAL_COMMUNITY): Payer: Self-pay

## 2019-07-05 ENCOUNTER — Ambulatory Visit (HOSPITAL_COMMUNITY)
Admission: RE | Admit: 2019-07-05 | Discharge: 2019-07-05 | Disposition: A | Discharge status: Home / Self Care | Payer: HMO | Source: Ambulatory Visit | Attending: Internal Medicine | Admitting: Internal Medicine

## 2019-07-05 ENCOUNTER — Other Ambulatory Visit: Payer: Self-pay | Admitting: *Deleted

## 2019-07-05 ENCOUNTER — Other Ambulatory Visit: Payer: Self-pay

## 2019-07-05 ENCOUNTER — Ambulatory Visit: Payer: Self-pay

## 2019-07-05 VITALS — BP 126/58 | HR 78 | Wt 214.4 lb

## 2019-07-05 DIAGNOSIS — I10 Essential (primary) hypertension: Secondary | ICD-10-CM | POA: Diagnosis not present

## 2019-07-05 DIAGNOSIS — I2510 Atherosclerotic heart disease of native coronary artery without angina pectoris: Secondary | ICD-10-CM | POA: Insufficient documentation

## 2019-07-05 DIAGNOSIS — H548 Legal blindness, as defined in USA: Secondary | ICD-10-CM | POA: Insufficient documentation

## 2019-07-05 DIAGNOSIS — I5022 Chronic systolic (congestive) heart failure: Secondary | ICD-10-CM | POA: Diagnosis not present

## 2019-07-05 DIAGNOSIS — Z951 Presence of aortocoronary bypass graft: Secondary | ICD-10-CM | POA: Insufficient documentation

## 2019-07-05 DIAGNOSIS — Z87891 Personal history of nicotine dependence: Secondary | ICD-10-CM | POA: Insufficient documentation

## 2019-07-05 DIAGNOSIS — I110 Hypertensive heart disease with heart failure: Secondary | ICD-10-CM | POA: Insufficient documentation

## 2019-07-05 DIAGNOSIS — I255 Ischemic cardiomyopathy: Secondary | ICD-10-CM | POA: Insufficient documentation

## 2019-07-05 DIAGNOSIS — Z952 Presence of prosthetic heart valve: Secondary | ICD-10-CM | POA: Insufficient documentation

## 2019-07-05 DIAGNOSIS — Z880 Allergy status to penicillin: Secondary | ICD-10-CM | POA: Diagnosis not present

## 2019-07-05 DIAGNOSIS — J449 Chronic obstructive pulmonary disease, unspecified: Secondary | ICD-10-CM | POA: Insufficient documentation

## 2019-07-05 DIAGNOSIS — H269 Unspecified cataract: Secondary | ICD-10-CM | POA: Diagnosis not present

## 2019-07-05 DIAGNOSIS — I252 Old myocardial infarction: Secondary | ICD-10-CM | POA: Diagnosis not present

## 2019-07-05 DIAGNOSIS — Z79899 Other long term (current) drug therapy: Secondary | ICD-10-CM | POA: Insufficient documentation

## 2019-07-05 DIAGNOSIS — Z7982 Long term (current) use of aspirin: Secondary | ICD-10-CM | POA: Diagnosis not present

## 2019-07-05 DIAGNOSIS — Z8249 Family history of ischemic heart disease and other diseases of the circulatory system: Secondary | ICD-10-CM | POA: Diagnosis not present

## 2019-07-05 LAB — BASIC METABOLIC PANEL
Anion gap: 9 (ref 5–15)
BUN: 47 mg/dL — ABNORMAL HIGH (ref 8–23)
CO2: 23 mmol/L (ref 22–32)
Calcium: 9.1 mg/dL (ref 8.9–10.3)
Chloride: 99 mmol/L (ref 98–111)
Creatinine, Ser: 1.72 mg/dL — ABNORMAL HIGH (ref 0.61–1.24)
GFR calc Af Amer: 47 mL/min — ABNORMAL LOW (ref >60)
GFR calc non Af Amer: 41 mL/min — ABNORMAL LOW (ref >60)
Glucose, Bld: 144 mg/dL — ABNORMAL HIGH (ref 70–99)
Potassium: 6.2 mmol/L — ABNORMAL HIGH (ref 3.5–5.1)
Sodium: 131 mmol/L — ABNORMAL LOW (ref 135–145)

## 2019-07-05 LAB — BRAIN NATRIURETIC PEPTIDE: B Natriuretic Peptide: 71.8 pg/mL (ref 0.0–100.0)

## 2019-07-05 MED ORDER — CARVEDILOL 12.5 MG PO TABS: 12.5000 mg | ORAL_TABLET | Freq: Two times a day (BID) | ORAL | 5 refills | Status: DC

## 2019-07-05 NOTE — Telephone Encounter (Signed)
-----   Message from Dolores Patty, MD sent at 07/05/2019  1:01 PM EDT ----- K 6.2. stop spiro asap. Repeat bmet on Wednesday. Stop any/all potassium supplements

## 2019-07-05 NOTE — Progress Notes (Signed)
Advanced Heart Failure Clinic Note    PCP: Tally Joe, MD PCP-Cardiologist: Chrystie Nose, MD  HF MD: Dr Gala Romney   HPI: Curtis [name] is a 69 y/o male with CAD s/p CABG and MVR in 9/20, COPD, chronic systolic HF due to iCM EF 20-25% in 10/20, legally blind.   He was admitted in 9/20 with NSTEMI found to have severe 3v CAD. Underwent CABG x3 (LIMA to LAD, SVG to OM, SVG to diagonal), MR status post MVR on 11/30/2018 with complicated hospital course following this surgery including postoperative cardiogenic shock in setting of EF of 25% requiring inotrope therapy and Impella placement.He also had a left hemothorax evacuation and postoperative atrial fibrillation.  He was admitted to the hospital again on 01/01/2019 with near syncope and was found to be hypotensive with a blood pressure of 84/62. His torsemide was discontinued along with his Entresto and spironolactone. He was to use torsemide as needed for volume overload. Repeat echocardiogram showed an LVEF of 20-25% with a stable bioprosthetic valve.   Today he returns for HF follow up. He is here with his  Choice Care Navigator. Since he has seen [place] has seen out PharmD and Dr. Rennis Golden. Says he feels much better. Weight stable at 214 at home. Breathing is fine. No orthopnea or PND. Can walk up steps without problem. He is unable to read pill bottles with ongoing vision issues. Medications are set up by his nurse. Still drinking 2 sodas and 3 glasses of water today. Taking torsemide 20 daily.  cMRI 4/22: EF 29%    Reds Clip 45>38>36%> 46% > 32% today  Past Medical History:  Diagnosis Date  . Coronary artery disease   . Elevated troponin 11/25/2018  . Tobacco use     Current Outpatient Medications  Medication Sig Dispense Refill  . aspirin EC 81 MG tablet Take 81 mg by mouth daily.    . carvedilol (COREG) 6.25 MG tablet Take 1 tablet (6.25 mg total) by mouth 2 (two) times daily. 60 tablet 3  . empagliflozin (JARDIANCE) 10 MG  TABS tablet Take 10 mg by mouth daily before breakfast. 30 tablet 11  . guaiFENesin (MUCINEX) 600 MG 12 hr tablet Take 600 mg by mouth as needed.    Marland Kitchen losartan (COZAAR) 100 MG tablet Take 1 tablet (100 mg total) by mouth daily. 30 tablet 6  . potassium chloride SA (KLOR-CON) 20 MEQ tablet Take 2 tablets (40 mEq total) by mouth daily. 60 tablet 3  . rosuvastatin (CRESTOR) 20 MG tablet Take 20 mg by mouth daily.    Marland Kitchen spironolactone (ALDACTONE) 25 MG tablet Take 1 tablet (25 mg total) by mouth daily. 90 tablet 3  . torsemide (DEMADEX) 20 MG tablet Take 1 tablet (20 mg total) by mouth daily. 30 tablet 3   No current facility-administered medications for this encounter.    Allergies  Allergen Reactions  . Penicillins Rash and Other (See Comments)    Tolerated cefepime 12/2018  Did it involve swelling of the face/tongue/throat, SOB, or low BP? Unknown Did it involve sudden or severe rash/hives, skin peeling, or any reaction on the inside of your mouth or nose? Yes Did you need to seek medical attention at a hospital or doctor's office? Unknown When did it last happen? childhood If all above answers are "NO", may proceed with cephalosporin use.       Social History   Socioeconomic History  . Marital status: Divorced    Spouse name: Not on file  .  Number of children: Not on file  . Years of education: Not on file  . Highest education level: Not on file  Occupational History  . Not on file  Tobacco Use  . Smoking status: Former Smoker    Packs/day: 0.25    Types: Cigarettes  . Smokeless tobacco: Never Used  Substance and Sexual Activity  . Alcohol use: Never  . Drug use: Never  . Sexual activity: Not Currently  Other Topics Concern  . Not on file  Social History Narrative  . Not on file   Social Determinants of Health   Financial Resource Strain: Low Risk   . Difficulty of Paying Living Expenses: Not hard at all  Food Insecurity: No Food Insecurity  . Worried About  Charity fundraiser in the Last Year: Never true  . Ran Out of Food in the Last Year: Never true  Transportation Needs: No Transportation Needs  . Lack of Transportation (Medical): No  . Lack of Transportation (Non-Medical): No  Physical Activity: Sufficiently Active  . Days of Exercise per Week: 3 days  . Minutes of Exercise per Session: 60 min  Stress: No Stress Concern Present  . Feeling of Stress : Not at all  Social Connections: Somewhat Isolated  . Frequency of Communication with Friends and Family: More than three times a week  . Frequency of Social Gatherings with Friends and Family: Never  . Attends Religious Services: Never  . Active Member of Clubs or Organizations: Yes  . Attends Archivist Meetings: Never  . Marital Status: Divorced  Human resources officer Violence: Not At Risk  . Fear of Current or Ex-Partner: No  . Emotionally Abused: No  . Physically Abused: No  . Sexually Abused: No      Family History  Problem Relation Age of Onset  . Heart attack Father   . Heart attack Mother     Vitals:   07/05/19 0937  BP: (!) 126/58  Pulse: 78  SpO2: 96%  Weight: 97.3 kg (214 lb 6.4 oz)   Wt Readings from Last 3 Encounters:  07/05/19 97.3 kg (214 lb 6.4 oz)  06/21/19 97 kg (213 lb 12.8 oz)  06/15/19 97.3 kg (214 lb 6.4 oz)     PHYSICAL EXAM: General:  Walked in the clinic with a care giver.  No resp difficulty HEENT: normal Neck: supple. no JVD. Carotids 2+ bilat; no bruits. No lymphadenopathy or thryomegaly appreciated. Cor: PMI nondisplaced. Regular rate & rhythm. No rubs, gallops or murmurs. Lungs: clear Abdomen: obese soft, nontender, nondistended. No hepatosplenomegaly. No bruits or masses. Good bowel sounds. Extremities: no cyanosis, clubbing, rash, edema Neuro: alert & orientedx3, cranial nerves grossly intact. moves all 4 extremities w/o difficulty. Affect pleasant   ASSESSMENT & PLAN:  1. Choronic systolic HF due to iCM - Echo 12/23/2018  EF  20-25% - cMRI on 06/24/19 EF 29% small area of subendocardial infarct in the mid and apical inferior wall and septum RV size and function were normal - NYHA II. Reds much improved at 32%.Volume looks good  - Continue torsemide 20 daily  - Continue potassium 40 daily  - Continue losartan 100 mg tablet daily. (Does not want Entresto because he had very low BP in past) - Continespironolactone25daily  - Increase carvedilol 12.5 mg twice a day.  - Continue Jardiance 10  - Long discussion about role of ICD and possible EF improvement with CRT. He would like to defer EP referral at this time.  - check labs  today  2. CAD - Cardiac catheterization 11/24/2018 showed multivessel disease. CABG x3 on 11/30/2018 - No s/s angina - Continue aspirin and statin  - refused CR  3.  Postoperative A. Fib - Now in NSR. Off amiodarone.  4. Essential hypertension - Improved  5. Cataracts - ok to proceed with cataract extraction  6. COPD - has quit smoking   7. S/p MVR - stable by echo.  - aware of need for SBE prophylaxis.   Total time spent 35 minutes. Over half that time spent discussing above.   Arvilla Meres, MD 07/05/19

## 2019-07-05 NOTE — Patient Instructions (Signed)
INCREASE Carvedilol to 12.5mg  (1 tab) twice a day  Labs today We will only contact you if something comes back abnormal or we need to make some changes. Otherwise no news is good news!  Your physician recommends that you schedule a follow-up appointment in: 4 months with Dr Gala Romney  Please call office at [phone] option 2 if you have any questions or concerns.   At the Advanced Heart Failure Clinic, you and your health needs are our priority. As part of our continuing mission to provide you with exceptional heart care, we have created designated Provider Care Teams. These Care Teams include your primary Cardiologist (physician) and Advanced Practice Providers (APPs- Physician Assistants and Nurse Practitioners) who all work together to provide you with the care you need, when you need it.   You may see any of the following providers on your designated Care Team at your next follow up: Marland Kitchen Dr Arvilla Meres . Dr Marca Ancona . Tonye Becket, NP . Robbie Lis, PA . Karle Plumber, PharmD   Please be sure to bring in all your medications bottles to every appointment.

## 2019-07-05 NOTE — Patient Outreach (Signed)
[facility]) Care Management  07/05/2019  Curtis [name] [birth year] [id number]   CSW made a second outreach attempt to pt.  CSW confirmed pt's identity and received verbal consent to speak with Corinne; pt's private Case Manager/Choice Navigator.  CSW explained reason for call; transportation.  Per Lourdes Sledge, patient has private pay caregivers as well as her support as the case Production designer, theatre/television/film.  Pt usually has transportation and they do not anticipate their being issues with this going forward. Pt was able to get to MD office today and is now back home. CSW discussed our services/support as CSW and seeing no needs or concerns will close the CSW referral.  CSW will advise [facility] team and PCP of above,  Reece Levy, MSW, [employer] Clinical Social Worker  [facility] [employer] [phone]

## 2019-07-05 NOTE — Addendum Note (Signed)
Encounter addended by: Marisa Hua, RN on: 07/05/2019 10:22 AM  Actions taken: Order list changed, Diagnosis association updated, Clinical Note Signed

## 2019-07-05 NOTE — Progress Notes (Signed)
ReDS Vest / Clip - 07/05/19 0900      ReDS Vest / Clip   Station Marker  D    Ruler Value  39    ReDS Value Range  Low volume    ReDS Actual Value  32    Anatomical Comments  sitting

## 2019-07-05 NOTE — Patient Outreach (Signed)
[facility]) Care Management  07/05/2019  Siler Mavis [birth year] [id number]   CSW attempted to reach pt this morning by phone however was unable to reach and no voicemail. Per Epic, pt has an appointment today at 9:40am with Dr. Gala Romney.  CSW will attempt outreach again later today to clarify pt needs.   Reece Levy, MSW, LCSW Clinical Social Worker  [facility] [employer] [phone]

## 2019-07-05 NOTE — Patient Outreach (Signed)
  [facility]) Care Management Chronic Special Needs Program    07/05/2019  Name: Curtis [name], DOB: Feb 15, 1951  MRN: [id number]   [name] is enrolled in a chronic special needs plan.  Telephone call to client regarding request for transportation assistance. Unable to reach or leave voice message.  No answer. Phone only rang.   PLAN: RNCM will attempt 3rd telephone follow up with client within 1 week .  George Ina RN,BSN,CCM Chronic Care Management Coordinator [facility] Care Management [phone]

## 2019-07-05 NOTE — Telephone Encounter (Signed)
Pt aware of results. Pt advised to stop Spironolactone and Potassium. Pt advised need to repeat bmet on Wednesday. Appt scheduled. Pt verbalized understanding.

## 2019-07-07 ENCOUNTER — Other Ambulatory Visit: Payer: Self-pay

## 2019-07-07 ENCOUNTER — Ambulatory Visit (HOSPITAL_COMMUNITY)
Admission: RE | Admit: 2019-07-07 | Discharge: 2019-07-07 | Disposition: A | Discharge status: Home / Self Care | Payer: HMO | Source: Ambulatory Visit | Attending: Cardiology | Admitting: Cardiology

## 2019-07-07 ENCOUNTER — Telehealth (HOSPITAL_COMMUNITY): Payer: Self-pay | Admitting: *Deleted

## 2019-07-07 DIAGNOSIS — I5022 Chronic systolic (congestive) heart failure: Secondary | ICD-10-CM | POA: Diagnosis not present

## 2019-07-07 LAB — BASIC METABOLIC PANEL
Anion gap: 8 (ref 5–15)
BUN: 45 mg/dL — ABNORMAL HIGH (ref 8–23)
CO2: 26 mmol/L (ref 22–32)
Calcium: 8.9 mg/dL (ref 8.9–10.3)
Chloride: 100 mmol/L (ref 98–111)
Creatinine, Ser: 1.8 mg/dL — ABNORMAL HIGH (ref 0.61–1.24)
GFR calc Af Amer: 44 mL/min — ABNORMAL LOW (ref >60)
GFR calc non Af Amer: 38 mL/min — ABNORMAL LOW (ref >60)
Glucose, Bld: 127 mg/dL — ABNORMAL HIGH (ref 70–99)
Potassium: 5 mmol/L (ref 3.5–5.1)
Sodium: 134 mmol/L — ABNORMAL LOW (ref 135–145)

## 2019-07-07 NOTE — Telephone Encounter (Signed)
CMRI results.   Curtis [name], [place]  07/07/2019 9:07 AM EDT    I spoke with Pt and his nurse case manage Curtis [name]. Patient declined ICD at this time and does not want to see EP.    Bensimhon, Bevelyn Buckles, MD  07/04/2019 11:56 AM EDT    EF 29%. Please refer to EP for ICD.

## 2019-07-07 NOTE — Telephone Encounter (Signed)
-----   Message from Dolores Patty, MD sent at 07/04/2019 11:56 AM EDT ----- EF 29%. Please refer to EP for ICD.

## 2019-07-12 ENCOUNTER — Ambulatory Visit: Payer: Self-pay

## 2019-07-12 ENCOUNTER — Ambulatory Visit: Payer: Self-pay | Admitting: *Deleted

## 2019-07-13 ENCOUNTER — Telehealth (HOSPITAL_COMMUNITY): Payer: Self-pay | Admitting: *Deleted

## 2019-07-13 ENCOUNTER — Other Ambulatory Visit: Payer: Self-pay

## 2019-07-13 NOTE — Patient Outreach (Signed)
[facility]) Care Management Chronic Special Needs Program  07/13/2019  Name: Channon Ambrosini DOB: January 25, 1951  MRN: [id number]  Mr. Abshir Paolini is enrolled in a chronic special needs plan for Heart Failure. Chronic Care Management Coordinator telephoned client's nurse care navigator case manager/ designated party release, Ester Skiff to review health risk assessment and to develop individualized care plan.  Introduced the chronic care management program, importance of client participation, and taking their care plan to all provider appointments and inpatient facilities.    Subjective: Client's nurse care manager states client recently saw his primary care provider in April 2021 and had follow up with cardiologist 07/05/19. She reports client is not having any issues or concerns at this time.  She reports client is weighing daily and taking his medications as prescribed. She reports client has vision issues due to his cataracts. She reports she manages client's medications. Nurse states client is scheduled to have his first cataract surgery on 07/27/19. Nurse states client has a private duty care agency called [employer] that assist with care. Nurse states client has transportation assistance to his appointments.   Goals Addressed            This Visit's Progress   . Client and/or caregiver will state at least 3 signs/ symptoms of heart failure within 6 months. (pt-stated)       RN case manager will send client education article:  Heart failure: When to call your doctor.  . Signs and symptoms of heart failure reviewed.  Advised to notify doctor for symptoms . Access 911 for severe symptoms . Weigh daily and record weights    . Client understands the importance of follow-up with providers by attending scheduled visits       Primary care provider visit 06/30/19 Cardiology visit 07/05/19 Continue to maintain and keep follow up visits with your providers    . Client verbalizes knowledge of  Heart Attack self management skills within 6 months       RN case manager will send client education article: Heart attack Keep scheduled appointments with provider Take your medications as prescribed.  Call 911 for symptoms.     . Client will report no worsening of symptoms related to heart disease within 6 months.       RN case manager will send client education article: Coronary artery disease and heart healthy diet.  Take your medications as prescribed.  Keep follow up appointments with your doctor Contact your doctor if you have questions/ concerns.      . Client will verbalize knowledge of chronic lung disease as evidenced by no ED visits or Inpatient stays related to chronic lung disease        RN case manager will send client education article: COPD and COPD: What you can do. Take your medications as prescribed.  Keep your follow up visits with your doctor.      . Client will verbalize knowledge of self management of Hypertension as evidences by BP reading of 140/90 or less; or as defined by provider       RN case manager will send client education article: High blood pressure: What you can do.  Take you medications as advised Please ask your provider, " what is my target blood pressure range Monitor your blood pressure and take results to your doctors appointment,.  Most recent blood pressure completed at 07/05/19 doctor visit 126/58    . Maintain timely refills of Heart Failure medication as prescribed within the year  Review of electronic medical record medication dispense report indicates client maintains timely refills of diabetic medications  Take your medications as prescribed.  Contact your assigned RN case manager [phone] if you have difficulty obtaining your medications Follow up with your doctor if you have questions.     . Obtain annual  Lipid Profile, LDL-C       Lipid profile completed on 01/27/20 The goal for LDL is less than 70 mg/ dl as you are at  high risk for complications try to avoid saturated fats, trans-fats, and eat more fiber. RN case manager will send client education article: Heart healthy diet.     . Visit Primary Care Provider or Cardiologist at least 2 times per year       Primary care provider visit 06/30/19 Cardiology visit 07/05/19 Continue to maintain and keep follow up visits with your providers      RNCM advised client/ caregiver to notify MD of any changes in condition prior to scheduled appointment. Client/ caregiver advised to contact RNCM as needed and contact their HTA concierge for benefit questions.  RNCM provided client/caregiver  24 hour HTA nurse advise line number [phone]  [facility] verified client aware of 911 services for urgent/ emergent needs.  Plan:  Send successful outreach letter with a copy of their individualized care plan, Send individual care plan to provider and Send educational material  Chronic care management coordination will outreach in:  6 Months    George Ina RN,BSN,CCM Chronic Care Management Coordinator [facility] Care Management [phone]

## 2019-07-13 NOTE — Telephone Encounter (Signed)
pts caregiver called concerned because patient was taken off potassium but he's still taking torsemide. She said his potassium drops fast. She is aware that it was stopped because his k was 6.2 and after stopping k and spiro it was 5.0. Caregiver wants to know if patient needs repeat labs. Pt is asymptomatic.   Routed to Dr.Bensimhon

## 2019-07-14 NOTE — Telephone Encounter (Signed)
I called and spoke with Ester she said she was out of town and asked that I call the patients home to schedule. I called the pt and he said he can't come this week he has other appointments but he would call me back to schedule for the beginning of next week.

## 2019-07-14 NOTE — Telephone Encounter (Signed)
Repeat bmet this week.

## 2019-07-21 ENCOUNTER — Inpatient Hospital Stay (HOSPITAL_COMMUNITY): Admission: RE | Admit: 2019-07-21 | Payer: HMO | Source: Ambulatory Visit

## 2019-08-03 ENCOUNTER — Telehealth (HOSPITAL_COMMUNITY): Payer: Self-pay

## 2019-08-03 NOTE — Telephone Encounter (Signed)
Received a fax requesting medical Records from [facility]. Records were successfully faxed to: [phone]. Medical request form will be scanned into patients chart.

## 2019-08-11 ENCOUNTER — Telehealth (HOSPITAL_COMMUNITY): Payer: Self-pay | Admitting: Cardiology

## 2019-08-11 MED ORDER — ROSUVASTATIN CALCIUM 20 MG PO TABS: 20.0000 mg | ORAL_TABLET | Freq: Every day | ORAL | 3 refills | Status: AC

## 2019-08-11 NOTE — Telephone Encounter (Signed)
Patients care giver called to request refills of crestor Medications sent

## 2019-08-26 ENCOUNTER — Other Ambulatory Visit: Payer: Self-pay

## 2019-08-26 ENCOUNTER — Ambulatory Visit (HOSPITAL_COMMUNITY)
Admission: RE | Admit: 2019-08-26 | Discharge: 2019-08-26 | Disposition: A | Discharge status: Home / Self Care | Payer: HMO | Source: Ambulatory Visit | Attending: Cardiology | Admitting: Cardiology

## 2019-08-26 DIAGNOSIS — I5042 Chronic combined systolic (congestive) and diastolic (congestive) heart failure: Secondary | ICD-10-CM | POA: Diagnosis present

## 2019-08-26 LAB — BASIC METABOLIC PANEL
Anion gap: 12 (ref 5–15)
BUN: 16 mg/dL (ref 8–23)
CO2: 28 mmol/L (ref 22–32)
Calcium: 8.6 mg/dL — ABNORMAL LOW (ref 8.9–10.3)
Chloride: 98 mmol/L (ref 98–111)
Creatinine, Ser: 1.25 mg/dL — ABNORMAL HIGH (ref 0.61–1.24)
GFR calc Af Amer: >60 mL/min (ref >60)
GFR calc non Af Amer: 60 mL/min — ABNORMAL LOW (ref >60)
Glucose, Bld: 206 mg/dL — ABNORMAL HIGH (ref 70–99)
Potassium: 3.3 mmol/L — ABNORMAL LOW (ref 3.5–5.1)
Sodium: 138 mmol/L (ref 135–145)

## 2019-08-27 ENCOUNTER — Telehealth (HOSPITAL_COMMUNITY): Payer: Self-pay | Admitting: Cardiology

## 2019-08-27 NOTE — Telephone Encounter (Signed)
pts sponsor/care giver called to review lab results Advised they had not been signed off by provider unable to review results at this time. Would return call regarding labs once signed off.   Caregiver asked to call her directly as patient may not answer phone

## 2019-09-03 ENCOUNTER — Telehealth (HOSPITAL_COMMUNITY): Payer: Self-pay | Admitting: Cardiology

## 2019-09-03 MED ORDER — POTASSIUM CHLORIDE CRYS ER 20 MEQ PO TBCR: 20.0000 meq | EXTENDED_RELEASE_TABLET | Freq: Every day | ORAL | 3 refills | Status: AC

## 2019-09-03 NOTE — Telephone Encounter (Signed)
Patient reports he is not taking potassium at this time. Advised to start 20 meq daily, patient will return call to schedule lab appt has to arrange transportation

## 2019-09-03 NOTE — Telephone Encounter (Signed)
-----   Message from Dolores Patty, MD sent at 09/03/2019  9:55 AM EDT ----- How much potassium is he taking? Have him take extra per day. Recheck 1 week.

## 2019-09-13 ENCOUNTER — Other Ambulatory Visit (HOSPITAL_COMMUNITY): Payer: Self-pay | Admitting: Cardiology

## 2019-09-13 ENCOUNTER — Other Ambulatory Visit (HOSPITAL_COMMUNITY): Payer: HMO

## 2019-09-13 DIAGNOSIS — I5042 Chronic combined systolic (congestive) and diastolic (congestive) heart failure: Secondary | ICD-10-CM

## 2019-09-16 ENCOUNTER — Telehealth (HOSPITAL_COMMUNITY): Payer: Self-pay | Admitting: Licensed Clinical Social Worker

## 2019-09-16 NOTE — Telephone Encounter (Signed)
CSW received call from pt former case Production designer, theatre/television/film, Fidel Levy.  Per Ester she was following pt for case management services through [employer] which pt was privately paying for.  This included aid services and [employer] assistance with managing medical appts, medications, etc.  At this time pt is unable pt continue paying for their services per Ester but she is very concerned with how pt is managing things at home.  States she has been staying somewhat involved despite discharging pt because of her concerns with his ability to stay on top of medical appts/medications and to just take care of himself in general.    States that they were transporting pt to and from all his medical appts and that he now does not have a reliable source of transportation.  Lives with his mother who has dementia and main support is from neighbors.  Pt missed labs with our clinic on Monday and Ester is concerned about possible weight gain- states pt has very uncontrolled diet and eats coffee cake in the morning, candy and junk food for lunch, and pizza for dinner.  Ester is wondering what kind of resources we might be able to offer to pt to assist with managing his needs at this time.    After talking with Ester CSW believes referral to [employer] would be appropriate to help him manage medical concerns at home and keep him closely connected to CSW and the clinic to manage other concerns as they arise.  CSW attempted to call pt to assess for paramedicine but phone just kept ringing and unable to leave a message- will attempt again tomorrow to assess  Burna Sis, LCSW Clinical Social Worker [facility] Desk#: [phone] Cell#: [phone]

## 2019-09-21 ENCOUNTER — Telehealth (HOSPITAL_COMMUNITY): Payer: Self-pay | Admitting: Licensed Clinical Social Worker

## 2019-09-21 NOTE — Telephone Encounter (Signed)
Paramedicine Initial Assessment:  Housing:  In what kind of housing do you live? House/apt/trailer/shelter? House  Do you rent/pay a mortgage/own? Owns his home outright  Do you live with anyone? 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 No concerns  Within the past 12 months have you ever stayed outside, in a car, tent, a shelter, or temporarily with someone? no  Within the past 12 months have you been unable to get utilities when it was really needed? no  Social:  What is your current marital status? divorced  Do you have any children? no  Do you have family or friends who live locally? no  Food:  Within the past 12 months were you ever worried that food would run out before you got money to buy more? no  Within the past 51months have you run out of food and didn't have money to buy more? no  Does have concerns about getting food due to transportation.  Currently getting help getting to and from appts through [place].  Per patient Curtis [name] was his aid while he was enrolled with private pay Care management services but is now helping him outside of work because he has no way to get to the grocery store.  CSW left message for [facility] CCM coordinator to inquire about possible transportation through [facility]- awaiting return call.    Income:  What is your current source of income? Pt and mom both get SSRI.  Pt reports getting about $1,500-1,600/month and pt mom around $1,100/month.  How hard is it for you to pay for the basics like food housing, medical care, and utilities? Not very hard- is always able to get what he needs.  Do you have outstanding medical bills? no  Insurance:  Are you currently insured? Yes- healthteam advantage  Do you have prescription coverage? Yes- states he has no had to pay for medications all year- pt not sure what program he is a part of that allows him to get this.  If no  insurance, have you applied for coverage (Medicaid, disability, marketplace etc)? n/a  Transportation:  Do you have transportation to your medical appointments? no   If yes, how? n/a  In the past 12 months has lack of transportation kept you from medical appts or from getting medications? yes  In the past 12 months has lack of transportation kept you from meetings, work, or getting things you needed? Yes  Pt does not have source of transportation at this time to help with getting to daytime medical appts/get medications- missed a lab appt last week due to this.   Daily Health Needs: Do you have a working scale at home? Yes but hasn't been using consistently.  How do you manage your medications at home? Fills a pill box.  Do you ever take your medications differently than prescribed? no  Do you have issues affording your medications? No- gets them for $0  If yes, has this ever prevented you from obtaining medications? n/a  Do you have any concerns with mobility at home? no  Do you use any assistive devices at home or have PCS at home? no  Do you have a PCP? Yes- Dr. Azucena Cecil who is listed in Epic.   Are there any additional barriers you see to getting the care you need?  Not at this time.  Pt hopeful to become more independent now that he has had cataract  surgery and can see again.  Has been able to fix his own medications and is working on getting a car so he can get to and from his appts and be able to do his own shopping.  CSW will continue to follow through paramedicine program and assist as needed.  Burna Sis, LCSW Clinical Social Worker [facility] Desk#: [phone] Cell#: [phone]

## 2019-09-24 ENCOUNTER — Telehealth (HOSPITAL_COMMUNITY): Payer: Self-pay

## 2019-09-24 NOTE — Telephone Encounter (Signed)
I called Curtis [name] to schedule our initial appointment. He stated he would be available early next week so we agreed to meet on Tuesday at 14:00.  [name], EMT 09/24/19

## 2019-09-28 ENCOUNTER — Other Ambulatory Visit (HOSPITAL_COMMUNITY): Payer: Self-pay | Admitting: *Deleted

## 2019-09-28 ENCOUNTER — Telehealth (HOSPITAL_COMMUNITY): Payer: Self-pay | Admitting: Licensed Clinical Social Worker

## 2019-09-28 ENCOUNTER — Other Ambulatory Visit (HOSPITAL_COMMUNITY): Payer: Self-pay

## 2019-09-28 NOTE — Telephone Encounter (Signed)
Paramedic informed CSW of pt missed lab appt from earlier this month.  CSW spoke with clinic staff and had it rescheduled for this Thursday.  Pt will need transport so CSW to call cab for pt to bring to appt- confirmed details with pt.  Burna Sis, LCSW Clinical Social Worker [facility] Desk#: [phone] Cell#: [phone]

## 2019-09-28 NOTE — Progress Notes (Signed)
Paramedicine Encounter    Patient ID: Shogo Larkey, male    DOB: 1950-05-15, 69 y.o.   MRN: [id number]   Patient Care Team: Tally Joe, MD as PCP - General (Family Medicine) Rennis Golden Lisette Abu, MD as PCP - Cardiology (Cardiology) Otho Ket, RN as [facility] Management  Patient Active Problem List   Diagnosis Date Noted  . Abnormality of gait 03/04/2019  . Near syncope   . AKI (acute kidney injury) (HCC) 01/01/2019  . Orthostatic hypotension 01/01/2019  . Hyperglycemia 01/01/2019  . Pain   . Hyponatremia   . Labile blood glucose   . Elevated serum creatinine   . Debility 12/11/2018  . S/P MVR (mitral valve replacement)   . Prediabetes   . PAF (paroxysmal atrial fibrillation) (HCC)   . S/P CABG x 3 11/30/2018  . Coronary artery disease involving native heart without angina pectoris   . Chronic combined systolic and diastolic CHF (congestive heart failure) (HCC)   . Pulmonary edema 11/24/2018  . Hypertensive emergency 11/24/2018  . Leukocytosis 11/24/2018  . NSTEMI (non-ST elevated myocardial infarction) (HCC)   . Hypertension     Current Outpatient Medications:  .  aspirin EC 81 MG tablet, Take 81 mg by mouth daily., Disp: , Rfl:  .  carvedilol (COREG) 12.5 MG tablet, Take 1 tablet (12.5 mg total) by mouth 2 (two) times daily., Disp: 60 tablet, Rfl: 5 .  empagliflozin (JARDIANCE) 10 MG TABS tablet, Take 10 mg by mouth daily before breakfast., Disp: 30 tablet, Rfl: 11 .  losartan (COZAAR) 100 MG tablet, Take 1 tablet (100 mg total) by mouth daily., Disp: 30 tablet, Rfl: 6 .  potassium chloride SA (KLOR-CON) 20 MEQ tablet, Take 1 tablet (20 mEq total) by mouth daily., Disp: 30 tablet, Rfl: 3 .  rosuvastatin (CRESTOR) 20 MG tablet, Take 1 tablet (20 mg total) by mouth daily., Disp: 90 tablet, Rfl: 3 .  torsemide (DEMADEX) 20 MG tablet, Take 1 tablet (20 mg total) by mouth daily., Disp: 30 tablet, Rfl: 3 .  guaiFENesin (MUCINEX) 600 MG 12 hr tablet, Take  600 mg by mouth as needed. (Patient not taking: Reported on 09/28/2019), Disp: , Rfl:  Allergies  Allergen Reactions  . Penicillins Rash and Other (See Comments)    Tolerated cefepime 12/2018  Did it involve swelling of the face/tongue/throat, SOB, or low BP? Unknown Did it involve sudden or severe rash/hives, skin peeling, or any reaction on the inside of your mouth or nose? Yes Did you need to seek medical attention at a hospital or doctor's office? Unknown When did it last happen? childhood If all above answers are "NO", may proceed with cephalosporin use.       Social History   Socioeconomic History  . Marital status: Divorced    Spouse name: Not on file  . Number of children: Not on file  . Years of education: Not on file  . Highest education level: Not on file  Occupational History  . Not on file  Tobacco Use  . Smoking status: Former Smoker    Packs/day: 0.25    Types: Cigarettes  . Smokeless tobacco: Never Used  Vaping Use  . Vaping Use: Never used  Substance and Sexual Activity  . Alcohol use: Never  . Drug use: Never  . Sexual activity: Not Currently  Other Topics Concern  . Not on file  Social History Narrative  . Not on file   Social Determinants of Health   Financial Resource  Strain: Low Risk   . Difficulty of Paying Living Expenses: Not hard at all  Food Insecurity: No Food Insecurity  . Worried About Programme researcher, broadcasting/film/video in the Last Year: Never true  . Ran Out of Food in the Last Year: Never true  Transportation Needs: Unmet Transportation Needs  . Lack of Transportation (Medical): Yes  . Lack of Transportation (Non-Medical): Yes  Physical Activity: Sufficiently Active  . Days of Exercise per Week: 3 days  . Minutes of Exercise per Session: 60 min  Stress: No Stress Concern Present  . Feeling of Stress : Not at all  Social Connections: Moderately Isolated  . Frequency of Communication with Friends and Family: More than three times a week   . Frequency of Social Gatherings with Friends and Family: Never  . Attends Religious Services: Never  . Active Member of Clubs or Organizations: Yes  . Attends Banker Meetings: Never  . Marital Status: Divorced  Catering manager Violence: Not At Risk  . Fear of Current or Ex-Partner: No  . Emotionally Abused: No  . Physically Abused: No  . Sexually Abused: No    Physical Exam Cardiovascular:     Rate and Rhythm: Normal rate and regular rhythm.     Pulses: Normal pulses.  Pulmonary:     Effort: Pulmonary effort is normal.     Breath sounds: Normal breath sounds.  Musculoskeletal:        General: Normal range of motion.     Right lower leg: No edema.     Left lower leg: No edema.  Skin:    General: Skin is warm and dry.     Capillary Refill: Capillary refill takes less than 2 seconds.  Neurological:     Mental Status: He is alert and oriented to person, place, and time.  Psychiatric:        Mood and Affect: Mood normal.         Future Appointments  Date Time Provider Department Center  09/30/2019 11:00 AM [facility] LAB [facility] None  11/09/2019 10:20 AM Bensimhon, Bevelyn Buckles, MD [facility] None  01/06/2020 10:00 AM Otho Ket, RN [facility] None    BP (!) 92/57 (BP Location: Left Arm, Patient Position: Sitting, Cuff Size: Normal)   Pulse 82   Resp 16   Wt (!) 227 lb (103 kg)   SpO2 95%   BMI 33.52 kg/m   Weight yesterday- 228 lb Last visit weight- n/a  Mr Buchler was seen at home today for our initial visit. He reported feeling generally well., denying chest pain, SOB, headache, dizziness, orthopnea, fever or cough over the past week. He reported being compliant with his medications which were verified. He uses a pillbox which he fills himself and feels comfortable with. At present he lives with his mother who suffers from dementia and he appears to be her sole caregiver. He has difficulty with transportation which I spoke with CSW about and she is working on.  Nothing further was necessary at today's visit but I will follow up later to discuss transportation and communication barriers when social worker is ready.   [name], EMT 09/28/19  ACTION: Home visit completed Next visit planned for 1 week

## 2019-09-29 ENCOUNTER — Telehealth (HOSPITAL_COMMUNITY): Payer: Self-pay | Admitting: Licensed Clinical Social Worker

## 2019-09-29 NOTE — Telephone Encounter (Signed)
CSW informed pt needs transportation to eye doctor appt today for final check up following his cataract surgery.  CSW arranged a cab to take him to appt.  Will continue to follow   Burna Sis, LCSW Clinical Social Worker [facility] Desk#: [phone] Cell#: [phone]

## 2019-09-30 ENCOUNTER — Other Ambulatory Visit: Payer: Self-pay | Admitting: *Deleted

## 2019-09-30 ENCOUNTER — Other Ambulatory Visit (HOSPITAL_COMMUNITY): Payer: HMO

## 2019-09-30 ENCOUNTER — Other Ambulatory Visit: Payer: Self-pay

## 2019-09-30 NOTE — Patient Outreach (Addendum)
  [facility]) Care Management Chronic Special Needs Program    09/30/2019  Name: Curtis [name], DOB: 01/15/1951  MRN: [id number]   [name] is enrolled in a chronic special needs plan for Heart Failure. Notification received from Rosetta Posner, LCSW that client is now enrolled in the [facility]. [place] states she will follow up with client regarding transportation needs. She reports clients dietary needs are a concern and inquired if other options were available for food resources.  RNCM offered to refer client to [facility] care management social worker for assistance with community resources.   PLAN;  Client will continue to be followed by this [facility] and Paramedicine collaboratively.   RNCM will refer client to [facility] care management social worker  George Ina [facility] Chronic Care Management Coordinator [facility] Care Management [phone]

## 2019-09-30 NOTE — Patient Outreach (Signed)
[facility] [employer]) Care Management  09/30/2019  Curtis [name] 05-Feb-1951 [id number]   CSW received a return call from Curtis [name], [employer] Social Worker, in response to the HIPAA compliant message left on voicemail for Mrs. Uris by CSW earlier in the day.  CSW was able to provide Mrs. Curtis [name] with all of the following food and transportation resources, via email, confirming receipt:  List of [employer]; The Little Green Book - [employer] in [place]; The [employer] Book - [employer], Stage manager and Soup Kitchens in [place]; [employer] of [employer] Financial and IT consultant for [employer] and [employer]; Scientist, water quality for [employer] ([place], [place]); [employer]; Resources for Seniors; English as a second language teacher for Older Adults in [place], [place]; [employer] (Optician, dispensing) Application ([employer]).  CSW also spoke with Mrs. Curtis [name] about [employer], [employer], through [employer] of [place], and [employer], through [employer].  Mrs. Curtis [name] reported that she will follow-up with patient to provide all of the above named resources, as well as assist with the referral process, if necessary.  CSW encouraged Mrs. Uris to contact CSW if she has questions or needs additional assistance, confirming that she has the correct contact information for CSW.  Danford Bad, BSW, MSW, LCSW  Licensed Restaurant manager, fast food [facility] System  Mailing [place] [address] [place], [place] [address] Physical Address[address], [place], [place] [address] Toll Free Main # [phone] Fax # [phone] Cell # [phone]  [email or web address]

## 2019-09-30 NOTE — Patient Outreach (Signed)
[facility]) Care Management  09/30/2019  Derrick Tiegs March 04, 1951 [id number]  CSW received a new referral on patient from George Ina, Chronic Special Needs Program Coordinator with [facility] Care Management, indicating that patient's [facility] Social Worker, Rosetta Posner [phone]) would like to converse with CSW about food resources and meal preparation for patient.  CSW made an initial attempt to try and contact Mrs. Wendi Maya today, per her request, but she was unavailable at the time of CSW's call.  CSW left a HIPAA compliant message on voicemail for Mrs. Uris and is currently awaiting a return call.  CSW will make a second outreach attempt within the next 3-4 business days, if a return call is not received from Mrs. Wendi Maya in the meantime.    Danford Bad, BSW, MSW, LCSW  Licensed Restaurant manager, fast food Health System  Mailing [place] [address] [place], [place] [address] Physical Address[address], [place], [place] [address] Toll Free Main # [phone] Fax # [phone] Cell # [phone]  Office # [phone] [email or web address]

## 2019-10-01 ENCOUNTER — Telehealth (HOSPITAL_COMMUNITY): Payer: Self-pay | Admitting: Licensed Clinical Social Worker

## 2019-10-01 NOTE — Telephone Encounter (Signed)
CSW received call from pt former outpatient case manager, Domingo Cocking, who reported that she was able to reach pt and he was in dire need of groceries- states pt reported he was eating potato chips and his mom was eating icecream.  CSW called pt to discuss getting taxi ride to [place] to get groceries- pt concerned that he wouldn't have a way to call CSW back to get a return ride.  CSW called paramedic to discuss a plan and received a call back from pt stating his neighbor came home early and can take him to the grocery and the pharmacy to pick up medications- no need for intervention at this time.  CSW will continue to follow and assist as needed  Burna Sis, LCSW Clinical Social Worker [facility] Desk#: [phone] Cell#: [phone]

## 2019-10-05 ENCOUNTER — Telehealth (HOSPITAL_COMMUNITY): Payer: Self-pay

## 2019-10-06 NOTE — Telephone Encounter (Signed)
I called Mr Curtis [name] to schedule an appointment. He did not answer and there was no machine on which I could leave a message. I will try again later in the week.   [name], EMT 10/06/19

## 2019-10-07 ENCOUNTER — Telehealth (HOSPITAL_COMMUNITY): Payer: Self-pay

## 2019-10-07 NOTE — Telephone Encounter (Signed)
I called Mr Shen to see if he was available for an appointment this afternoon. He stated he was not available but agreed to meet me tomorrow. He stated he could see me tomorrow at 14:30 but was trying to find a way to the grocery store tomorrow, which I took to mean he may cancel our appointment. I will make every effort to see him at the agreed upon time tomorrow.   [name], EMT 10/07/19

## 2019-10-08 ENCOUNTER — Other Ambulatory Visit (HOSPITAL_COMMUNITY): Payer: Self-pay

## 2019-10-08 NOTE — Progress Notes (Signed)
Paramedicine Encounter    Patient ID: Curtis [name], male    DOB: February 24, 1951, 69 y.o.   MRN: [id number]   Patient Care Team: Curtis Joe, MD as PCP - General (Family Medicine) Curtis Golden Lisette Abu, MD as PCP - Cardiology (Cardiology) Curtis Ket, RN as [facility] Management  Patient Active Problem List   Diagnosis Date Noted  . Abnormality of gait 03/04/2019  . Near syncope   . AKI (acute kidney injury) (HCC) 01/01/2019  . Orthostatic hypotension 01/01/2019  . Hyperglycemia 01/01/2019  . Pain   . Hyponatremia   . Labile blood glucose   . Elevated serum creatinine   . Debility 12/11/2018  . S/P MVR (mitral valve replacement)   . Prediabetes   . PAF (paroxysmal atrial fibrillation) (HCC)   . S/P CABG x 3 11/30/2018  . Coronary artery disease involving native heart without angina pectoris   . Chronic combined systolic and diastolic CHF (congestive heart failure) (HCC)   . Pulmonary edema 11/24/2018  . Hypertensive emergency 11/24/2018  . Leukocytosis 11/24/2018  . NSTEMI (non-ST elevated myocardial infarction) (HCC)   . Hypertension     Current Outpatient Medications:  .  aspirin EC 81 MG tablet, Take 81 mg by mouth daily., Disp: , Rfl:  .  carvedilol (COREG) 12.5 MG tablet, Take 1 tablet (12.5 mg total) by mouth 2 (two) times daily., Disp: 60 tablet, Rfl: 5 .  empagliflozin (JARDIANCE) 10 MG TABS tablet, Take 10 mg by mouth daily before breakfast., Disp: 30 tablet, Rfl: 11 .  losartan (COZAAR) 100 MG tablet, Take 1 tablet (100 mg total) by mouth daily., Disp: 30 tablet, Rfl: 6 .  potassium chloride SA (KLOR-CON) 20 MEQ tablet, Take 1 tablet (20 mEq total) by mouth daily., Disp: 30 tablet, Rfl: 3 .  rosuvastatin (CRESTOR) 20 MG tablet, Take 1 tablet (20 mg total) by mouth daily., Disp: 90 tablet, Rfl: 3 .  torsemide (DEMADEX) 20 MG tablet, Take 1 tablet (20 mg total) by mouth daily., Disp: 30 tablet, Rfl: 3 .  guaiFENesin (MUCINEX) 600 MG 12 hr tablet, Take  600 mg by mouth as needed. (Patient not taking: Reported on 09/28/2019), Disp: , Rfl:  Allergies  Allergen Reactions  . Penicillins Rash and Other (See Comments)    Tolerated cefepime 12/2018  Did it involve swelling of the face/tongue/throat, SOB, or low BP? Unknown Did it involve sudden or severe rash/hives, skin peeling, or any reaction on the inside of your mouth or nose? Yes Did you need to seek medical attention at a hospital or doctor's office? Unknown When did it last happen? childhood If all above answers are "NO", may proceed with cephalosporin use.       Social History   Socioeconomic History  . Marital status: Divorced    Spouse name: Not on file  . Number of children: Not on file  . Years of education: Not on file  . Highest education level: Not on file  Occupational History  . Not on file  Tobacco Use  . Smoking status: Former Smoker    Packs/day: 0.25    Types: Cigarettes  . Smokeless tobacco: Never Used  Vaping Use  . Vaping Use: Never used  Substance and Sexual Activity  . Alcohol use: Never  . Drug use: Never  . Sexual activity: Not Currently  Other Topics Concern  . Not on file  Social History Narrative  . Not on file   Social Determinants of Health   Financial Resource  Strain: Low Risk   . Difficulty of Paying Living Expenses: Not hard at all  Food Insecurity: No Food Insecurity  . Worried About Programme researcher, broadcasting/film/video in the Last Year: Never true  . Ran Out of Food in the Last Year: Never true  Transportation Needs: Unmet Transportation Needs  . Lack of Transportation (Medical): Yes  . Lack of Transportation (Non-Medical): Yes  Physical Activity: Sufficiently Active  . Days of Exercise per Week: 3 days  . Minutes of Exercise per Session: 60 min  Stress: No Stress Concern Present  . Feeling of Stress : Not at all  Social Connections: Moderately Isolated  . Frequency of Communication with Friends and Family: More than three times a week   . Frequency of Social Gatherings with Friends and Family: Never  . Attends Religious Services: Never  . Active Member of Clubs or Organizations: Yes  . Attends Banker Meetings: Never  . Marital Status: Divorced  Catering manager Violence: Not At Risk  . Fear of Current or Ex-Partner: No  . Emotionally Abused: No  . Physically Abused: No  . Sexually Abused: No    Physical Exam Cardiovascular:     Rate and Rhythm: Normal rate and regular rhythm.     Pulses: Normal pulses.  Pulmonary:     Effort: Pulmonary effort is normal.     Breath sounds: Normal breath sounds.  Musculoskeletal:        General: Normal range of motion.     Right lower leg: No edema.     Left lower leg: No edema.  Skin:    General: Skin is warm and dry.     Capillary Refill: Capillary refill takes less than 2 seconds.  Neurological:     Mental Status: He is alert and oriented to person, place, and time.  Psychiatric:        Mood and Affect: Mood normal.         Future Appointments  Date Time Provider Department Center  11/09/2019 10:20 AM Bensimhon, Bevelyn Buckles, MD [facility] None  01/06/2020 10:00 AM Curtis Ket, RN [facility] None    BP (!) 99/53 (BP Location: Left Arm, Patient Position: Sitting, Cuff Size: Normal)   Pulse 82   Resp 16   Wt 231 lb 6.4 oz (105 kg)   SpO2 95%   BMI 34.17 kg/m   Weight yesterday- did not weigh Last visit weight- 227 lb  Mr Curtis [name] was seen at home today and rpeorted feeling well. He denied chest pain, SOB, headache, dizziness, orthopnea, fever or cough over the past week. He stated he has been compliant wit his medications over the past week but his weight has been trending up. He is not weighing daily so I requested he begin and he was agreeable. When I asked about his diet he said he is eating a lot of rotisserie chicken from the grocery store and home made burgers. He admitted to eating the occasional hot dog but said he only has one when he does. I continue  to stress the importance of dietary compliance for his heart health and he expresses understanding. His medications were verified and his pillbox was refilled. I will follow up next week.   Curtis [name], EMT 10/08/19  ACTION: Home visit completed Next visit planned for 1 week

## 2019-10-21 ENCOUNTER — Telehealth (HOSPITAL_COMMUNITY): Payer: Self-pay

## 2019-10-21 NOTE — Telephone Encounter (Signed)
I attempted to call Curtis [name] to see if he was available for an appointment. He had been out of town last week so I was hoping to catch up with him today or tomorrow. Upon calling his phone an automated message came on stated "the call cannot be completed because the called party is not available." I will try again tomorrow.   [name], EMT 10/21/19

## 2019-10-22 ENCOUNTER — Telehealth (HOSPITAL_COMMUNITY): Payer: Self-pay

## 2019-10-22 NOTE — Telephone Encounter (Signed)
I called Curtis [name] to try and schedule an appointment. His phone did not ring and went to an automated recording stating the called is unavailable. I will try again next week.   [name], EMT 10/22/19

## 2019-11-04 ENCOUNTER — Telehealth (HOSPITAL_COMMUNITY): Payer: Self-pay | Admitting: Licensed Clinical Social Worker

## 2019-11-04 ENCOUNTER — Other Ambulatory Visit (HOSPITAL_COMMUNITY): Payer: Self-pay

## 2019-11-04 NOTE — Progress Notes (Signed)
Paramedicine Encounter    Patient ID: Curtis [name], male    DOB: [birth year], 69 y.o.   MRN: [id number]   Patient Care Team: Curtis Joe, MD as PCP - General (Family Medicine) Curtis [name] Curtis Abu, MD as PCP - Cardiology (Cardiology) Curtis Ket, RN as [facility] Management  Patient Active Problem List   Diagnosis Date Noted  . Abnormality of gait 03/04/2019  . Near syncope   . AKI (acute kidney injury) (HCC) 01/01/2019  . Orthostatic hypotension 01/01/2019  . Hyperglycemia 01/01/2019  . Pain   . Hyponatremia   . Labile blood glucose   . Elevated serum creatinine   . Debility 12/11/2018  . S/P MVR (mitral valve replacement)   . Prediabetes   . PAF (paroxysmal atrial fibrillation) (HCC)   . S/P CABG x 3 11/30/2018  . Coronary artery disease involving native heart without angina pectoris   . Chronic combined systolic and diastolic CHF (congestive heart failure) (HCC)   . Pulmonary edema 11/24/2018  . Hypertensive emergency 11/24/2018  . Leukocytosis 11/24/2018  . NSTEMI (non-ST elevated myocardial infarction) (HCC)   . Hypertension     Current Outpatient Medications:  .  aspirin EC 81 MG tablet, Take 81 mg by mouth daily., Disp: , Rfl:  .  carvedilol (COREG) 12.5 MG tablet, Take 1 tablet (12.5 mg total) by mouth 2 (two) times daily., Disp: 60 tablet, Rfl: 5 .  empagliflozin (JARDIANCE) 10 MG TABS tablet, Take 10 mg by mouth daily before breakfast., Disp: 30 tablet, Rfl: 11 .  guaiFENesin (MUCINEX) 600 MG 12 hr tablet, Take 600 mg by mouth as needed. (Patient not taking: Reported on 09/28/2019), Disp: , Rfl:  .  losartan (COZAAR) 100 MG tablet, Take 1 tablet (100 mg total) by mouth daily., Disp: 30 tablet, Rfl: 6 .  potassium chloride SA (KLOR-CON) 20 MEQ tablet, Take 1 tablet (20 mEq total) by mouth daily., Disp: 30 tablet, Rfl: 3 .  rosuvastatin (CRESTOR) 20 MG tablet, Take 1 tablet (20 mg total) by mouth daily., Disp: 90 tablet, Rfl: 3 .  torsemide (DEMADEX)  20 MG tablet, Take 1 tablet (20 mg total) by mouth daily., Disp: 30 tablet, Rfl: 3 Allergies  Allergen Reactions  . Penicillins Rash and Other (See Comments)    Tolerated cefepime 12/2018  Did it involve swelling of the face/tongue/throat, SOB, or low BP? Unknown Did it involve sudden or severe rash/hives, skin peeling, or any reaction on the inside of your mouth or nose? Yes Did you need to seek medical attention at a hospital or doctor's office? Unknown When did it last happen? childhood If all above answers are "NO", may proceed with cephalosporin use.       Social History   Socioeconomic History  . Marital status: Divorced    Spouse name: Not on file  . Number of children: Not on file  . Years of education: Not on file  . Highest education level: Not on file  Occupational History  . Not on file  Tobacco Use  . Smoking status: Former Smoker    Packs/day: 0.25    Types: Cigarettes  . Smokeless tobacco: Never Used  Vaping Use  . Vaping Use: Never used  Substance and Sexual Activity  . Alcohol use: Never  . Drug use: Never  . Sexual activity: Not Currently  Other Topics Concern  . Not on file  Social History Narrative  . Not on file   Social Determinants of Health   Financial Resource  Strain: Low Risk   . Difficulty of Paying Living Expenses: Not hard at all  Food Insecurity: No Food Insecurity  . Worried About Programme researcher, broadcasting/film/video in the Last Year: Never true  . Ran Out of Food in the Last Year: Never true  Transportation Needs: Unmet Transportation Needs  . Lack of Transportation (Medical): Yes  . Lack of Transportation (Non-Medical): Yes  Physical Activity: Sufficiently Active  . Days of Exercise per Week: 3 days  . Minutes of Exercise per Session: 60 min  Stress: No Stress Concern Present  . Feeling of Stress : Not at all  Social Connections: Moderately Isolated  . Frequency of Communication with Friends and Family: More than three times a week  .  Frequency of Social Gatherings with Friends and Family: Never  . Attends Religious Services: Never  . Active Member of Clubs or Organizations: Yes  . Attends Banker Meetings: Never  . Marital Status: Divorced  Catering manager Violence: Not At Risk  . Fear of Current or Ex-Partner: No  . Emotionally Abused: No  . Physically Abused: No  . Sexually Abused: No    Physical Exam Cardiovascular:     Rate and Rhythm: Regular rhythm. Tachycardia present.     Pulses: Normal pulses.  Pulmonary:     Effort: Pulmonary effort is normal.     Breath sounds: Normal breath sounds.  Abdominal:     General: There is no distension.  Musculoskeletal:        General: Normal range of motion.     Right lower leg: No edema.     Left lower leg: No edema.  Skin:    General: Skin is warm and dry.     Capillary Refill: Capillary refill takes less than 2 seconds.  Neurological:     Mental Status: He is alert and oriented to person, place, and time.  Psychiatric:        Mood and Affect: Mood normal.         Future Appointments  Date Time Provider Department Center  11/09/2019 10:20 AM Bensimhon, Bevelyn Buckles, MD [facility] None  01/06/2020 10:00 AM Curtis Ket, RN [facility] None    BP 133/71 (BP Location: Left Arm, Patient Position: Sitting, Cuff Size: Normal)   Pulse (!) 102   Resp 16   Wt 231 lb 3.2 oz (104.9 kg)   SpO2 96%   BMI 34.14 kg/m   Weight yesterday- did not weigh  Last visit weight- 231 lb  Curtis [name] was sen at home today and reported feeling well. He denied chest pain, SOB, headache, dizziness, orthopnea, fever or cough since our last visit. He reported being compliant with his medications and his weight has been stable. Curtis [name] now has reliable transportation and is able to manage his medications without assistance. He has a detailed medication list which outlines when to take each medications and he uses this to fill his own pillbox. We spoke at length about the  paramedicine program being for people who lack these skills and he agreed that he did not think it was necessary for him to continue in the program. I contacted the HF clinic SW, Curtis [name], and made her aware. Curtis [name] will be graduated from this point moving forward.   [name], EMT 11/04/19  ACTION: Home visit completed

## 2019-11-04 NOTE — Telephone Encounter (Signed)
CSW called pt to confirm he had transport for MD appt next week.  Pt reports he now has a car and has no concerns about making it to MD appts at this time- confirms he is aware of appt for next week and does not forsee any barriers to attending.  Will continue to follow and assist as needed  Burna Sis, LCSW Clinical Social Worker [facility] Desk#: [phone] Cell#: [phone]

## 2019-11-07 IMAGING — CT CT HEAD W/O CM
3 of 4 series · 15 of 47 positions shown, 18 images · non-contrast
Comparison: None.

CLINICAL DATA: Altered level of consciousness.

EXAM:
CT HEAD WITHOUT CONTRAST
TECHNIQUE: Contiguous axial images were obtained from the base of the skull
through the vertex without intravenous contrast.

[Series 5: head 3.0 mpr cor · coronal · 0.34mm/px · 3 of 67 slices shown]
[im 23/67  brain]
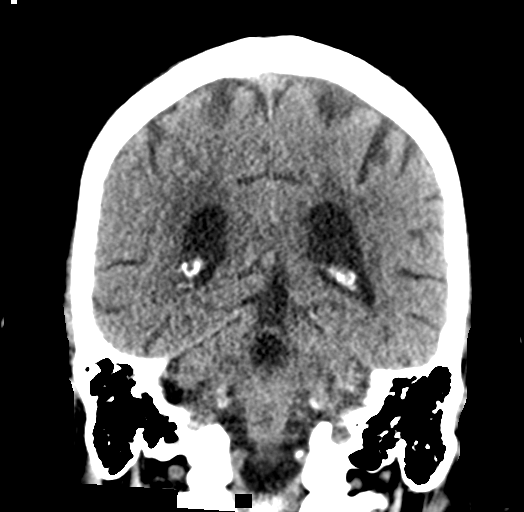
[im 30/67  brain]
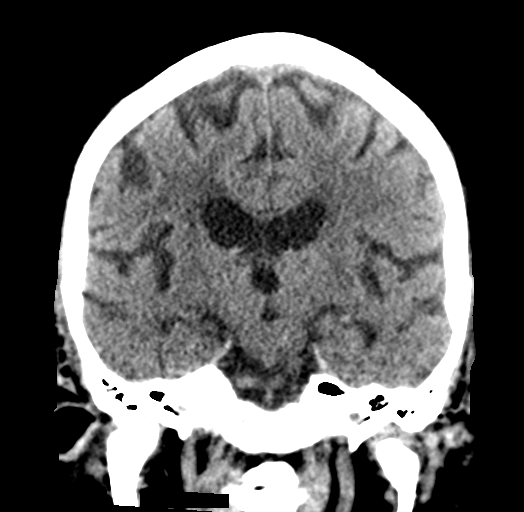
[im 37/67  brain]
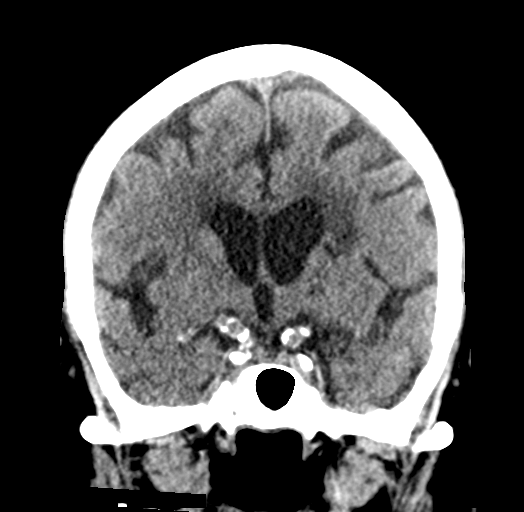

[Series 6: head 3.0 mpr sag · sagittal · 0.34mm/px · 3 of 58 slices shown]
[im 20/58  brain]
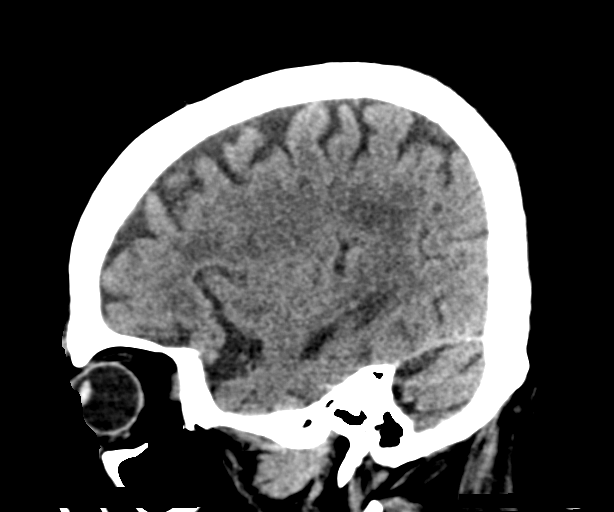
[im 29/58  brain]
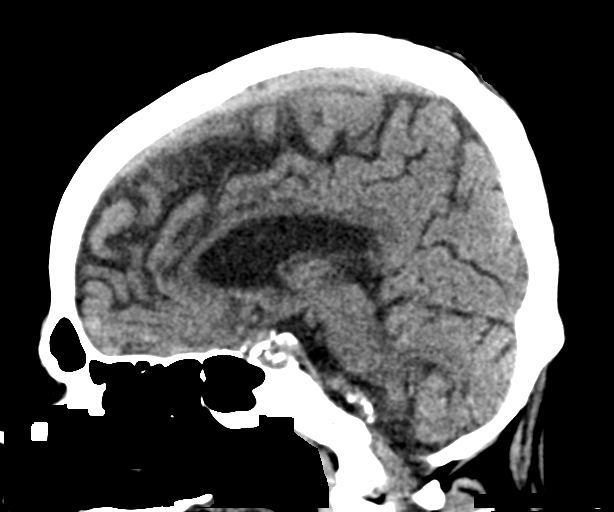
[im 39/58  brain]
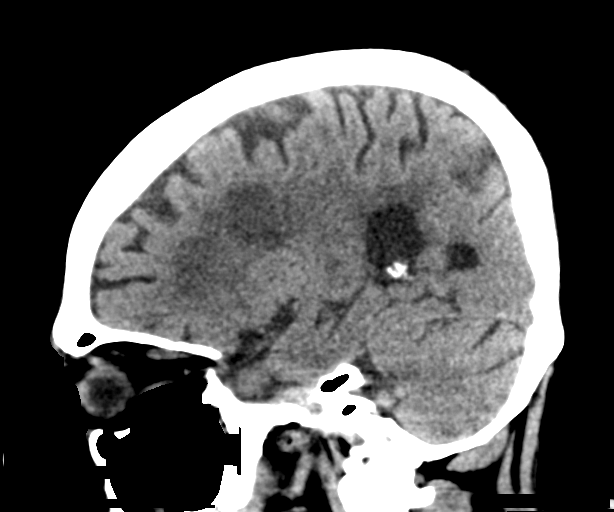

[Series 7: head 3.0 mpr ax · axial · 0.36mm/px · z∈[-187,-41]mm · 9 of 57 slices shown, 12 images]
[im 4/57  brain]
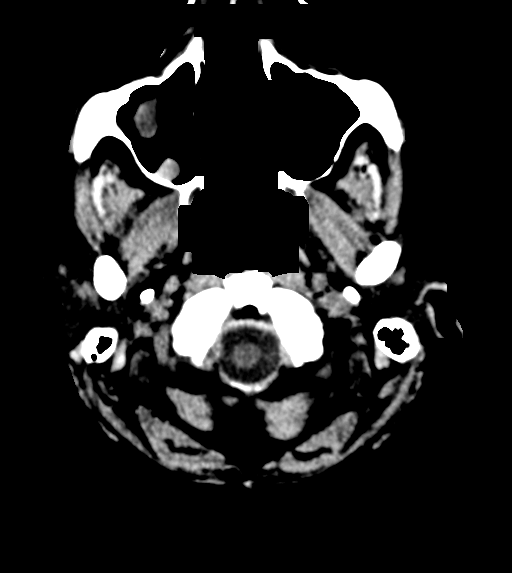
[im 4/57  bone]
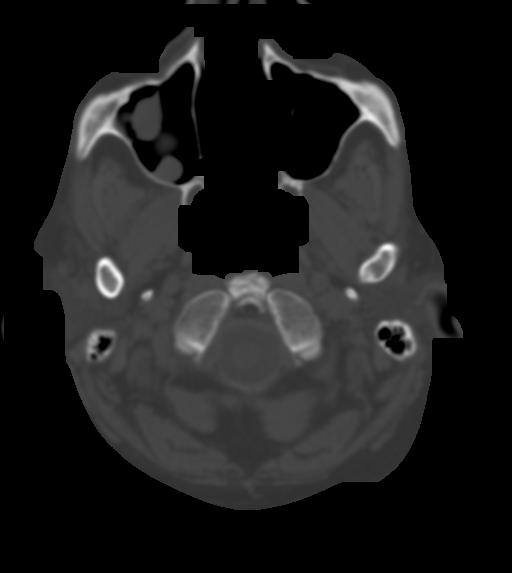
[im 10/57  brain]
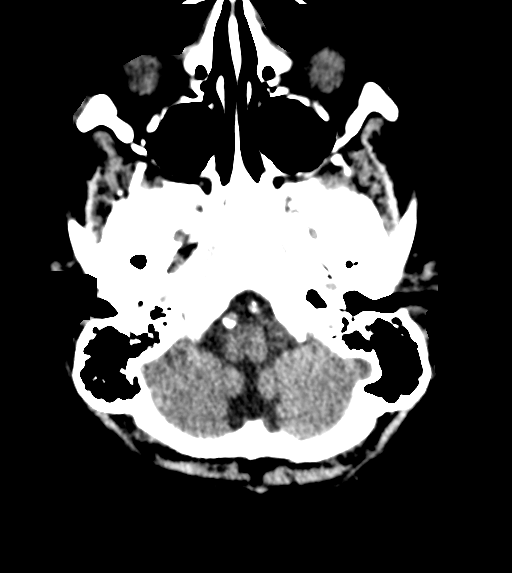
[im 17/57  brain]
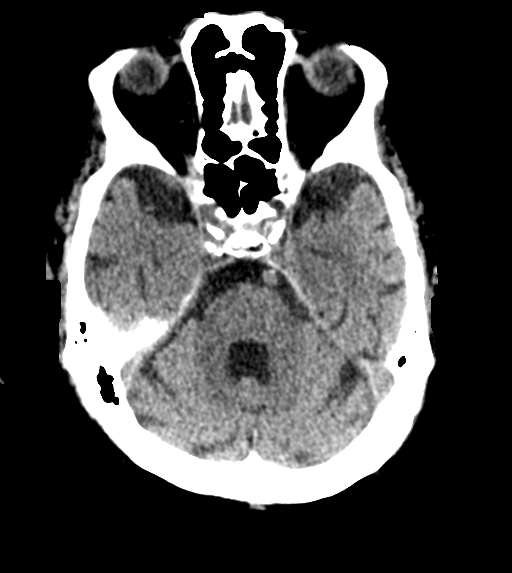
[im 24/57  brain]
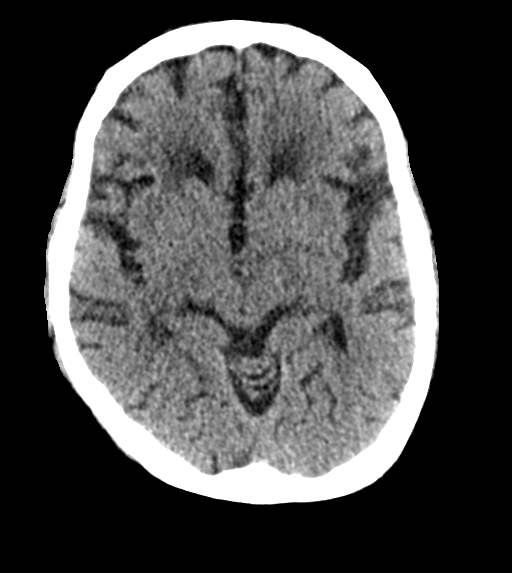
[im 30/57  brain]
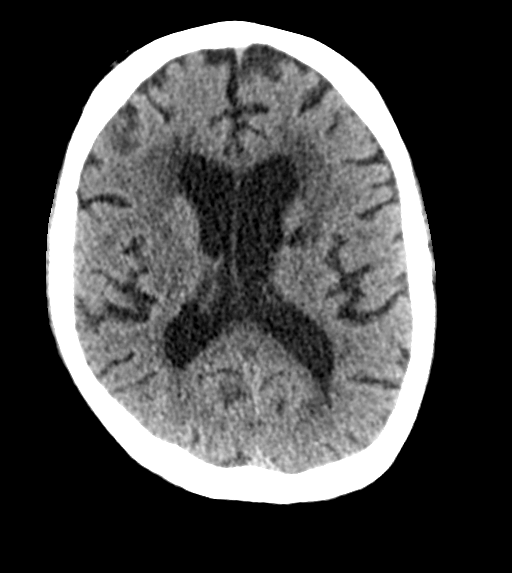
[im 30/57  bone]
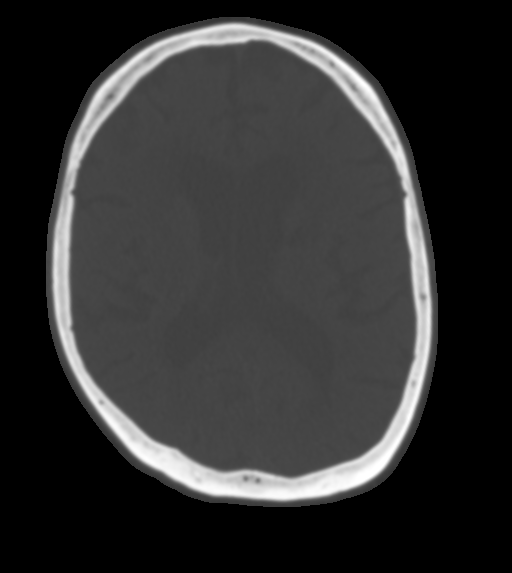
[im 33/57  brain]
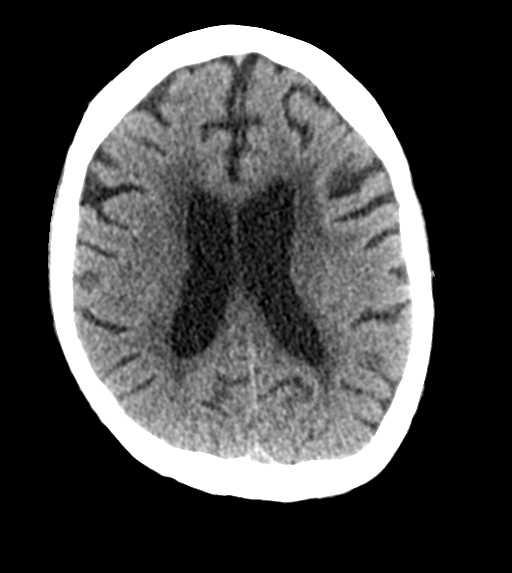
[im 40/57  brain]
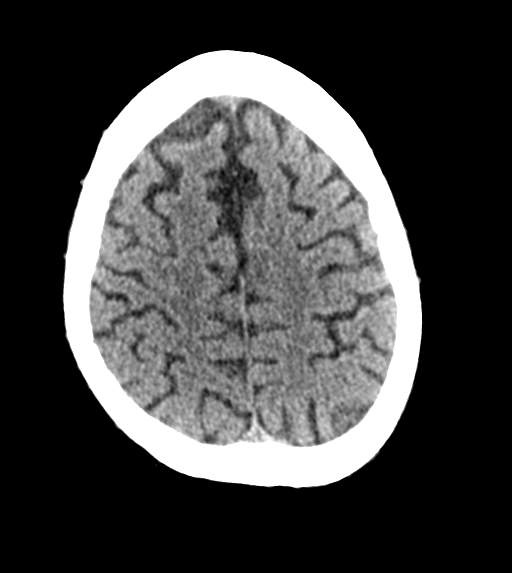
[im 47/57  brain]
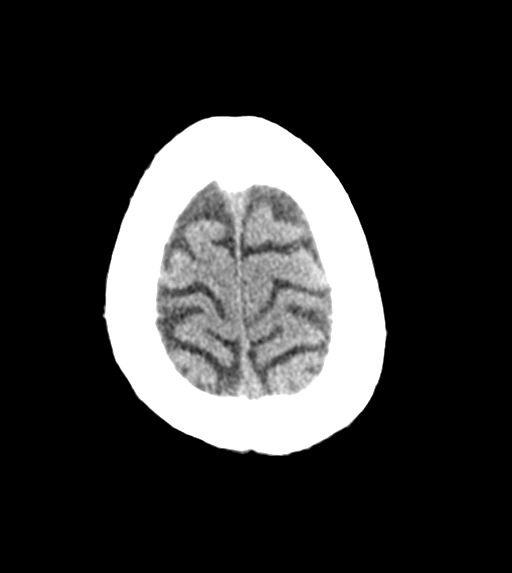
[im 53/57  brain]
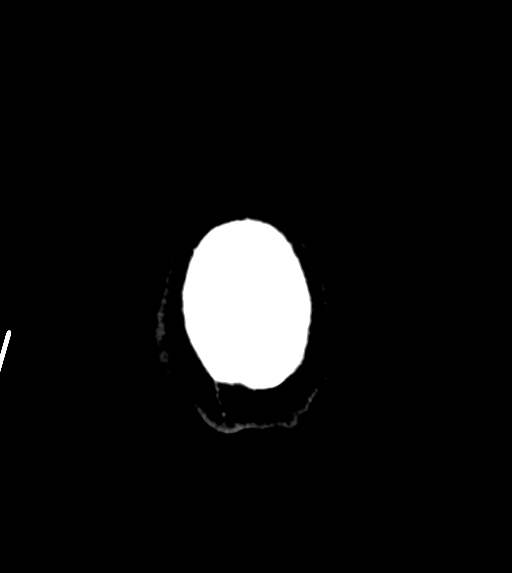
[im 53/57  bone]
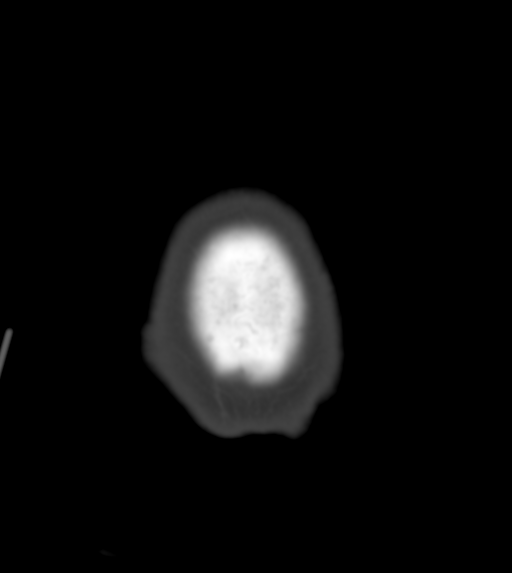

[15 of 47 positions shown; findings below may reference images not displayed]

FINDINGS: Brain: No evidence of acute infarction, hemorrhage, extra-axial
collection, ventriculomegaly, or mass effect. Small left basal
ganglia lacunar infarct. Generalized cerebral atrophy.
Periventricular white matter low attenuation likely secondary to
microangiopathy.

Vascular: Cerebrovascular atherosclerotic calcifications are noted.

Skull: Negative for fracture or focal lesion.

Sinuses/Orbits: Visualized portions of the orbits are unremarkable.
Mastoid sinuses are clear. Mucous retention cyst in bilateral
maxillary sinuses.

Other: None.
IMPRESSION: 1. No acute intracranial pathology.
2. Chronic microvascular disease and cerebral atrophy.

## 2019-11-08 NOTE — Progress Notes (Signed)
[facility] Note    PCP: Tally Joe, MD PCP-Cardiologist: Chrystie Nose, MD  HF MD: Dr Gala Romney   HPI: Curtis [name] is a 69 y/o male with CAD s/p CABG/MVR in 9/20, COPD, chronic systolic HF due to iCM EF 20-25% in 10/20.  Admitted 9/20 with NSTEMI found to have severe 3v CAD. Underwent CABG x3 (LIMA to LAD, SVG to OM, SVG to diagonal), MR status post MVR on 11/30/2018 with complicated hospital course following this surgery including postoperative cardiogenic shock in setting of EF of 25% requiring inotrope therapy and Impella placement.He also had a left hemithorax evacuation and postoperative atrial fibrillation.  Readmitted 01/01/2019 with near syncope and was found to be hypotensive with a blood pressure of 84/62. His torsemide was discontinued along with his Entresto and spironolactone. He was to use torsemide as needed for volume overload. Repeat echocardiogram showed an LVEF of 20-25% with a stable bioprosthetic valve.   Today he returns for HF follow up. Recently has both cataracts removed and now can see again! Back to driving. Breathing is fine.  Active around the house. Can go the store without a problem. No CP or edema. Still drinking his sodas. Taking torsemide 20 daily. Weight stable.   cMRI 4/21: EF 29% Delayed enhancement with small area of subendocardial infarct in the mid and apical inferior wall and septum. RV normal    Reds Clip 45>38>36%> 46% > 32% today  Past Medical History:  Diagnosis Date  . Cataract    left and right eye  . Coronary artery disease   . Elevated troponin 11/25/2018  . Tobacco use     Current Outpatient Medications  Medication Sig Dispense Refill  . aspirin EC 81 MG tablet Take 81 mg by mouth daily.    . carvedilol (COREG) 12.5 MG tablet Take 1 tablet (12.5 mg total) by mouth 2 (two) times daily. 60 tablet 5  . empagliflozin (JARDIANCE) 10 MG TABS tablet Take 10 mg by mouth daily before breakfast. 30 tablet 11  .  guaiFENesin (MUCINEX) 600 MG 12 hr tablet Take 600 mg by mouth as needed. (Patient not taking: Reported on 09/28/2019)    . losartan (COZAAR) 100 MG tablet Take 1 tablet (100 mg total) by mouth daily. 30 tablet 6  . potassium chloride SA (KLOR-CON) 20 MEQ tablet Take 1 tablet (20 mEq total) by mouth daily. 30 tablet 3  . rosuvastatin (CRESTOR) 20 MG tablet Take 1 tablet (20 mg total) by mouth daily. 90 tablet 3  . torsemide (DEMADEX) 20 MG tablet Take 1 tablet (20 mg total) by mouth daily. 30 tablet 3   No current facility-administered medications for this encounter.    Allergies  Allergen Reactions  . Penicillins Rash and Other (See Comments)    Tolerated cefepime 12/2018  Did it involve swelling of the face/tongue/throat, SOB, or low BP? Unknown Did it involve sudden or severe rash/hives, skin peeling, or any reaction on the inside of your mouth or nose? Yes Did you need to seek medical attention at a hospital or doctor's office? Unknown When did it last happen? childhood If all above answers are "NO", may proceed with cephalosporin use.       Social History   Socioeconomic History  . Marital status: Divorced    Spouse name: Not on file  . Number of children: Not on file  . Years of education: Not on file  . Highest education level: Not on file  Occupational History  . Not  on file  Tobacco Use  . Smoking status: Former Smoker    Packs/day: 0.25    Types: Cigarettes  . Smokeless tobacco: Never Used  Vaping Use  . Vaping Use: Never used  Substance and Sexual Activity  . Alcohol use: Never  . Drug use: Never  . Sexual activity: Not Currently  Other Topics Concern  . Not on file  Social History Narrative  . Not on file   Social Determinants of Health   Financial Resource Strain: Low Risk   . Difficulty of Paying Living Expenses: Not hard at all  Food Insecurity: No Food Insecurity  . Worried About Programme researcher, broadcasting/film/video in the Last Year: Never true  . Ran Out of  Food in the Last Year: Never true  Transportation Needs: Unmet Transportation Needs  . Lack of Transportation (Medical): Yes  . Lack of Transportation (Non-Medical): Yes  Physical Activity: Sufficiently Active  . Days of Exercise per Week: 3 days  . Minutes of Exercise per Session: 60 min  Stress: No Stress Concern Present  . Feeling of Stress : Not at all  Social Connections: Moderately Isolated  . Frequency of Communication with Friends and Family: More than three times a week  . Frequency of Social Gatherings with Friends and Family: Never  . Attends Religious Services: Never  . Active Member of Clubs or Organizations: Yes  . Attends Banker Meetings: Never  . Marital Status: Divorced  Catering manager Violence: Not At Risk  . Fear of Current or Ex-Partner: No  . Emotionally Abused: No  . Physically Abused: No  . Sexually Abused: No      Family History  Problem Relation Age of Onset  . Heart attack Father   . Heart attack Mother     Vitals:   11/09/19 1009  BP: 140/90  Pulse: 94  SpO2: 96%  Weight: 106.2 kg (234 lb 3.2 oz)   Wt Readings from Last 3 Encounters:  11/04/19 104.9 kg (231 lb 3.2 oz)  10/08/19 105 kg (231 lb 6.4 oz)  09/28/19 (!) 103 kg (227 lb)     PHYSICAL EXAM: General:  Well appearing. No resp difficulty HEENT: normal Neck: supple. no JVD. Carotids 2+ bilat; no bruits. No lymphadenopathy or thryomegaly appreciated. Cor: PMI nondisplaced. Regular rate & rhythm. No rubs, gallops or murmurs. Lungs: clear Abdomen: soft, nontender, nondistended. No hepatosplenomegaly. No bruits or masses. Good bowel sounds. Extremities: no cyanosis, clubbing, rash, edema Neuro: alert & orientedx3, cranial nerves grossly intact. moves all 4 extremities w/o difficulty. Affect pleasant   ASSESSMENT & PLAN:  1. Choronic systolic HF due to iCM - Echo 38/75/6433 EF  20-25% - cMRI on 06/24/19 EF 29% small area of subendocardial infarct in the mid and  apical inferior wall and septum RV normasl - Stable NYHA II - Volume status looks good - Continue torsemide 20 daily  - Continue potassium 40 daily  - Continue losartan 100 mg tablet daily. (Does not want Entresto because he had very low BP in past) - Continespironolactone25daily  - Increase carvedilol 18.75 mg twice a day.  - Continue Jardiance 10  - Again discussed role of ICD and possible EF improvement with CRT. He would like to defer EP referral at this time.  - check labs today - RTC in 6 months with echo   2. CAD - s/pCABG x3 on 11/30/2018 - No s/s angina - Continue aspirin and statin  - refused CR  3.  Postoperative A. Fib -  Now in NSR. Off amiodarone.  4. Essential hypertension - BP still up a bit. Increase carvedilol.   5. COPD - has quit smoking   6. S/p MVR - stable by echo.  - aware of need for SBE prophylaxis.   Arvilla Meres, MD 11/08/19

## 2019-11-09 ENCOUNTER — Other Ambulatory Visit: Payer: Self-pay

## 2019-11-09 ENCOUNTER — Ambulatory Visit (HOSPITAL_COMMUNITY)
Admission: RE | Admit: 2019-11-09 | Discharge: 2019-11-09 | Disposition: A | Discharge status: Home / Self Care | Payer: HMO | Source: Ambulatory Visit | Attending: Internal Medicine | Admitting: Internal Medicine

## 2019-11-09 VITALS — BP 140/90 | HR 94 | Wt 234.2 lb

## 2019-11-09 DIAGNOSIS — Z87891 Personal history of nicotine dependence: Secondary | ICD-10-CM | POA: Diagnosis not present

## 2019-11-09 DIAGNOSIS — I255 Ischemic cardiomyopathy: Secondary | ICD-10-CM | POA: Diagnosis not present

## 2019-11-09 DIAGNOSIS — I2510 Atherosclerotic heart disease of native coronary artery without angina pectoris: Secondary | ICD-10-CM | POA: Insufficient documentation

## 2019-11-09 DIAGNOSIS — Z7984 Long term (current) use of oral hypoglycemic drugs: Secondary | ICD-10-CM | POA: Insufficient documentation

## 2019-11-09 DIAGNOSIS — I5022 Chronic systolic (congestive) heart failure: Secondary | ICD-10-CM

## 2019-11-09 DIAGNOSIS — Z79899 Other long term (current) drug therapy: Secondary | ICD-10-CM | POA: Insufficient documentation

## 2019-11-09 DIAGNOSIS — Z7982 Long term (current) use of aspirin: Secondary | ICD-10-CM | POA: Insufficient documentation

## 2019-11-09 DIAGNOSIS — I110 Hypertensive heart disease with heart failure: Secondary | ICD-10-CM | POA: Insufficient documentation

## 2019-11-09 DIAGNOSIS — Z951 Presence of aortocoronary bypass graft: Secondary | ICD-10-CM

## 2019-11-09 DIAGNOSIS — Z952 Presence of prosthetic heart valve: Secondary | ICD-10-CM | POA: Insufficient documentation

## 2019-11-09 DIAGNOSIS — I10 Essential (primary) hypertension: Secondary | ICD-10-CM

## 2019-11-09 DIAGNOSIS — J449 Chronic obstructive pulmonary disease, unspecified: Secondary | ICD-10-CM | POA: Diagnosis not present

## 2019-11-09 LAB — BASIC METABOLIC PANEL
Anion gap: 10 (ref 5–15)
BUN: 12 mg/dL (ref 8–23)
CO2: 27 mmol/L (ref 22–32)
Calcium: 8.8 mg/dL — ABNORMAL LOW (ref 8.9–10.3)
Chloride: 102 mmol/L (ref 98–111)
Creatinine, Ser: 1.15 mg/dL (ref 0.61–1.24)
GFR calc Af Amer: >60 mL/min (ref >60)
GFR calc non Af Amer: >60 mL/min (ref >60)
Glucose, Bld: 144 mg/dL — ABNORMAL HIGH (ref 70–99)
Potassium: 3.8 mmol/L (ref 3.5–5.1)
Sodium: 139 mmol/L (ref 135–145)

## 2019-11-09 LAB — BRAIN NATRIURETIC PEPTIDE: B Natriuretic Peptide: 150 pg/mL — ABNORMAL HIGH (ref 0.0–100.0)

## 2019-11-09 LAB — CBC
HCT: 42 % (ref 39.0–52.0)
Hemoglobin: 13.7 g/dL (ref 13.0–17.0)
MCH: 32 pg (ref 26.0–34.0)
MCHC: 32.6 g/dL (ref 30.0–36.0)
MCV: 98.1 fL (ref 80.0–100.0)
Platelets: 246 10*3/uL (ref 150–400)
RBC: 4.28 MIL/uL (ref 4.22–5.81)
RDW: 13.1 % (ref 11.5–15.5)
WBC: 11.6 10*3/uL — ABNORMAL HIGH (ref 4.0–10.5)
nRBC: 0 % (ref 0.0–0.2)

## 2019-11-09 MED ORDER — CARVEDILOL 12.5 MG PO TABS: 18.7500 mg | ORAL_TABLET | Freq: Two times a day (BID) | ORAL | 5 refills | Status: AC

## 2019-11-09 NOTE — Patient Instructions (Signed)
Increase Carvedilol to 18.75 mg (1 & 1/2 tabs) Twice daily   Labs done today, your results will be available in MyChart, we will contact you for abnormal readings.  Please call our office in January to schedule your follow up appointment  If you have any questions or concerns before your next appointment please send [place] a message through [place] or call our office at [phone].    TO LEAVE A MESSAGE FOR THE NURSE SELECT OPTION 2, PLEASE LEAVE A MESSAGE INCLUDING:  YOUR NAME  DATE OF BIRTH  CALL BACK NUMBER  REASON FOR CALL**this is important as we prioritize the call backs  YOU WILL RECEIVE A CALL BACK THE SAME DAY AS LONG AS YOU CALL BEFORE 4:00 PM  .[email or web address]

## 2019-11-09 NOTE — Addendum Note (Signed)
Encounter addended by: Noralee Space, RN on: 11/09/2019 11:01 AM  Actions taken: Diagnosis association updated, Order list changed, Clinical Note Signed, Charge Capture section accepted

## 2019-12-03 IMAGING — DX DG CHEST 1V PORT
1 series · 1 of 1 positions shown · non-contrast
Comparison: Chest radiograph dated 12/11/2018

CLINICAL DATA: 68-year-old male with recent history of CABG
presenting with weakness.

EXAM:
PORTABLE CHEST 1 VIEW

[chest]
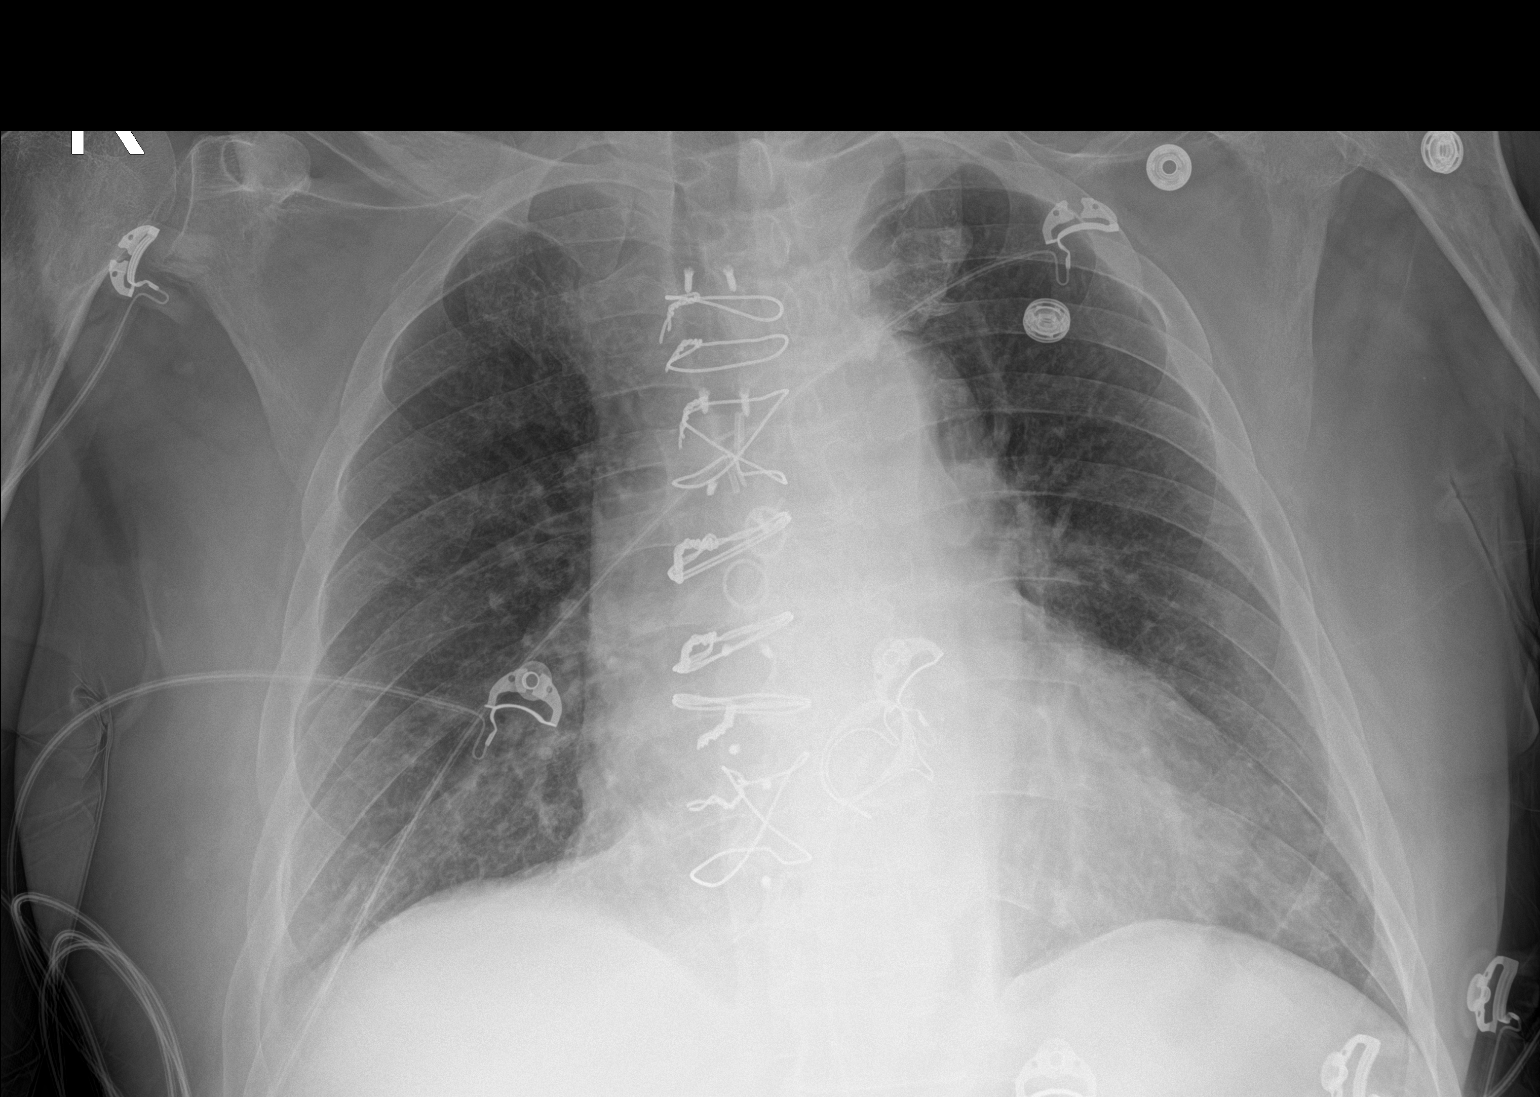

[1 of 1 positions shown; findings below may reference images not displayed]

FINDINGS: There is no focal consolidation, pleural effusion, or pneumothorax.
There is stable cardiomegaly. Median sternotomy wires and mechanical
heart valve noted. No acute osseous pathology.
IMPRESSION: 1. No acute cardiopulmonary process.
2. Stable cardiomegaly.

## 2019-12-22 ENCOUNTER — Other Ambulatory Visit (HOSPITAL_COMMUNITY): Payer: Self-pay | Admitting: Internal Medicine

## 2020-01-06 ENCOUNTER — Ambulatory Visit: Payer: Self-pay

## 2020-01-06 ENCOUNTER — Other Ambulatory Visit: Payer: Self-pay

## 2020-01-06 NOTE — Patient Outreach (Signed)
  [facility]) Care Management Chronic Special Needs Program    01/06/2020  Name: Curtis [name], DOB: [birth year]  MRN: [id number]   Mr. Teofil Maniaci is enrolled in a chronic special needs plan for Heart Failure. Telephone call to client for CSNP assessment update. Unable to reach. HIPAA compliant voice message left with call back phone number and return call request.   PLAN:  RNCM will attempt 2nd telephone call to client in 2 weeks.   George Ina RN,BSN,CCM Chronic Care Management Coordinator [facility] Care Management [phone]

## 2020-01-07 ENCOUNTER — Other Ambulatory Visit: Payer: HMO

## 2020-01-07 NOTE — Patient Outreach (Addendum)
[facility]) Care Management Chronic Special Needs Program  01/07/2020  Name: Curtis [name] DOB: 1950-09-19  MRN: [id number]  [name] is enrolled in a chronic special needs plan for Heart Failure. HIPAA verified. Client states he is doing well. Client reports he recently secured a vehicle and now has transportation to his appointments. Client reports he is able to afford his medications and takes them as prescribed. Client reports his next follow up visit with Dr. Gala Romney, cardiologist is March 2022.      Goals Addressed              This Visit's Progress   .  Client and/or caregiver will state at least 3 signs/ symptoms of heart failure within 6 months. (pt-stated)   On track     Signs and symptoms of heart failure  discussed with client.  Review [employer] [facility] [employer] calendar sent in the mail for heart failure information It is important to weigh daily and write down weights. Pay attention to your body if you have any shortness of breath, Swelling in your feet, ankles, legs or your waistband gets tight.   Report mild to moderate symptoms to your doctor. Call 911 for severe symptoms.      .  COMPLETED: Client understands the importance of follow-up with providers by attending scheduled visits   On track     Primary care provider visit 06/30/19 Cardiology visit 07/05/19 and September 2021      .  Client verbalizes knowledge of Heart Attack self management skills within 6 months   On track     RN case manager discussed heart attack symptoms with client. Keep scheduled appointments with provider Take your medications as prescribed.  Call 911 for symptoms.     .  COMPLETED: Client will report no worsening of symptoms related to heart disease within 6 months.   On track     Client denies symptoms related to heart disease.       .  Client will verbalize knowledge of chronic lung disease as evidenced by no ED visits or Inpatient stays related to chronic lung  disease    On track     Review [employer] calendar sent in the mail for COPD information and action plan.  Contact your provider for any signs and symptoms of mild shortness of breath Call 911 if you are having severe shortness of breath.  Contact to take your medications as prescribed.      .  Client will verbalize knowledge of self management of Hypertension as evidences by BP reading of 140/90 or less; or as defined by provider   On track     Plan to check you blood pressure regularly.  Review your [employer] calendar sent to you in the mail for high blood pressure information. Continue to take your medications as advised.     .  COMPLETED: Decrease inpatient Heart Failure admissions/ readmissions with in the year        Client denies hospitalization related to heart failure in the past year.     .  COMPLETED: Decrease the use of hospital emergency department related to heart failure within the next year        Client denies hospital emergency usage related to heart failure    .  Maintain timely refills of Heart Failure medication as prescribed within the year    On track     Client reports maintaining timely refills of his medications.  Contact your assigned RN case manager [phone] if you have difficulty obtaining your medications     .  Obtain annual  Lipid Profile, LDL-C   On track     Lipid profile completed on 01/27/19 The goal for LDL is less than 70 mg/ dl as you are at high risk for complications try to avoid saturated fats, trans-fats, and eat more fiber.  Plan to take your statin medication as ordered.     .  COMPLETED: Visit Primary Care Provider or Cardiologist at least 2 times per year        Primary care provider visit 06/30/19 Cardiology visit 07/05/19 and September 2021.  Client reports next follow up visit scheduled March 2022        Plan:  Send successful outreach letter with a copy of their individualized care plan and Send individual  care plan to provider  [facility] Care Management RNCM will continue to follow client through 03/03/20.  Client will be followed by the [facility] [employer] case management team beginning March 04, 2020.  RNCM discussed this case management transition with client.   Chronic care management coordinator will outreach in:  6 Months   George Ina RN,BSN,CCM Chronic Care Management Coordinator [facility] Care Management [phone]     .

## 2020-02-01 ENCOUNTER — Other Ambulatory Visit: Payer: Self-pay

## 2020-02-01 NOTE — Patient Outreach (Signed)
  [facility]) Care Management Chronic Special Needs Program    02/01/2020  Name: Curtis [name], DOB: [birth year]  MRN: [id number]   [name] is enrolled in a chronic special needs plan for Heart Failure.[facility] Care Management will continue to provide services for this member through 03/03/20.  The [facility] care management team will assume care 03/04/2020.   George Ina RN,BSN,CCM Chronic Care Management Coordinator [facility] Care Management [phone]

## 2020-03-07 ENCOUNTER — Other Ambulatory Visit: Payer: Self-pay

## 2020-05-02 DEATH — deceased

## 2020-06-08 ENCOUNTER — Encounter (HOSPITAL_COMMUNITY): Payer: HMO | Admitting: Internal Medicine

## 2020-06-08 ENCOUNTER — Ambulatory Visit (HOSPITAL_COMMUNITY): Payer: Self-pay
